# Patient Record
Sex: Male | Born: 1955
Health system: Southern US, Community
[De-identification: ages and names within clinical notes are randomized; demographics above are authoritative.]

## PROBLEM LIST (undated history)

## (undated) DIAGNOSIS — E119 Type 2 diabetes mellitus without complications: Secondary | ICD-10-CM

## (undated) DIAGNOSIS — R42 Dizziness and giddiness: Secondary | ICD-10-CM

## (undated) DIAGNOSIS — I350 Nonrheumatic aortic (valve) stenosis: Secondary | ICD-10-CM

## (undated) DIAGNOSIS — I10 Essential (primary) hypertension: Secondary | ICD-10-CM

## (undated) DIAGNOSIS — E785 Hyperlipidemia, unspecified: Secondary | ICD-10-CM

## (undated) DIAGNOSIS — N400 Enlarged prostate without lower urinary tract symptoms: Secondary | ICD-10-CM

## (undated) DIAGNOSIS — J302 Other seasonal allergic rhinitis: Secondary | ICD-10-CM

## (undated) DIAGNOSIS — R740 Nonspecific elevation of levels of transaminase and lactic acid dehydrogenase [LDH]: Secondary | ICD-10-CM

## (undated) DIAGNOSIS — Z87442 Personal history of urinary calculi: Secondary | ICD-10-CM

## (undated) DIAGNOSIS — J189 Pneumonia, unspecified organism: Secondary | ICD-10-CM

## (undated) DIAGNOSIS — R011 Cardiac murmur, unspecified: Secondary | ICD-10-CM

## (undated) DIAGNOSIS — J383 Other diseases of vocal cords: Secondary | ICD-10-CM

## (undated) DIAGNOSIS — K219 Gastro-esophageal reflux disease without esophagitis: Secondary | ICD-10-CM

## (undated) HISTORY — DX: Personal history of urinary calculi: Z87.442

## (undated) HISTORY — DX: Other diseases of vocal cords: J38.3

## (undated) HISTORY — DX: Essential (primary) hypertension: I10

## (undated) HISTORY — DX: Other seasonal allergic rhinitis: J30.2

## (undated) HISTORY — DX: Cardiac murmur, unspecified: R01.1

## (undated) HISTORY — DX: Nonrheumatic aortic (valve) stenosis: I35.0

## (undated) HISTORY — DX: Nonspecific elevation of levels of transaminase and lactic acid dehydrogenase (ldh): R74.0

## (undated) HISTORY — DX: Hyperlipidemia, unspecified: E78.5

## (undated) HISTORY — DX: Gastro-esophageal reflux disease without esophagitis: K21.9

## (undated) HISTORY — PX: CYSTECTOMY: SUR359

## (undated) HISTORY — DX: Benign prostatic hyperplasia without lower urinary tract symptoms: N40.0

## (undated) HISTORY — DX: Pneumonia, unspecified organism: J18.9

---

## 1997-10-13 DIAGNOSIS — E785 Hyperlipidemia, unspecified: Secondary | ICD-10-CM

## 1997-10-13 DIAGNOSIS — E1169 Type 2 diabetes mellitus with other specified complication: Secondary | ICD-10-CM | POA: Insufficient documentation

## 1997-10-13 HISTORY — DX: Hyperlipidemia, unspecified: E78.5

## 1999-09-13 DIAGNOSIS — J45909 Unspecified asthma, uncomplicated: Secondary | ICD-10-CM | POA: Insufficient documentation

## 1999-10-14 HISTORY — PX: US ECHOCARDIOGRAPHY: HXRAD669

## 1999-10-26 ENCOUNTER — Ambulatory Visit (HOSPITAL_COMMUNITY): Admission: RE | Admit: 1999-10-26 | Discharge: 1999-10-26 | Payer: Self-pay | Admitting: Internal Medicine

## 2000-09-12 HISTORY — PX: CYSTOSCOPY: SUR368

## 2000-09-30 ENCOUNTER — Encounter: Admission: RE | Admit: 2000-09-30 | Discharge: 2000-09-30 | Payer: Self-pay | Admitting: Family Medicine

## 2000-09-30 ENCOUNTER — Encounter: Payer: Self-pay | Admitting: Family Medicine

## 2001-10-13 DIAGNOSIS — K219 Gastro-esophageal reflux disease without esophagitis: Secondary | ICD-10-CM | POA: Insufficient documentation

## 2001-10-13 HISTORY — DX: Gastro-esophageal reflux disease without esophagitis: K21.9

## 2003-05-14 DIAGNOSIS — N400 Enlarged prostate without lower urinary tract symptoms: Secondary | ICD-10-CM | POA: Insufficient documentation

## 2003-05-14 HISTORY — DX: Benign prostatic hyperplasia without lower urinary tract symptoms: N40.0

## 2003-09-13 DIAGNOSIS — I10 Essential (primary) hypertension: Secondary | ICD-10-CM | POA: Insufficient documentation

## 2004-04-12 ENCOUNTER — Encounter: Payer: Self-pay | Admitting: Family Medicine

## 2004-04-12 LAB — CONVERTED CEMR LAB: PSA: 0.31 ng/mL

## 2004-05-01 ENCOUNTER — Ambulatory Visit: Payer: Self-pay | Admitting: Family Medicine

## 2004-05-04 ENCOUNTER — Ambulatory Visit: Payer: Self-pay | Admitting: Family Medicine

## 2004-06-20 ENCOUNTER — Ambulatory Visit: Payer: Self-pay | Admitting: Family Medicine

## 2004-06-20 ENCOUNTER — Encounter: Admission: RE | Admit: 2004-06-20 | Discharge: 2004-06-20 | Payer: Self-pay | Admitting: Family Medicine

## 2004-06-28 ENCOUNTER — Ambulatory Visit: Payer: Self-pay | Admitting: Family Medicine

## 2004-06-30 ENCOUNTER — Ambulatory Visit: Payer: Self-pay | Admitting: Family Medicine

## 2004-09-29 ENCOUNTER — Ambulatory Visit: Payer: Self-pay | Admitting: Family Medicine

## 2004-12-04 ENCOUNTER — Ambulatory Visit: Payer: Self-pay | Admitting: Family Medicine

## 2004-12-13 ENCOUNTER — Encounter: Payer: Self-pay | Admitting: Family Medicine

## 2004-12-13 LAB — CONVERTED CEMR LAB: PSA: 0.35 ng/mL

## 2005-01-08 ENCOUNTER — Ambulatory Visit: Payer: Self-pay | Admitting: Family Medicine

## 2005-05-13 ENCOUNTER — Encounter: Payer: Self-pay | Admitting: Family Medicine

## 2005-05-13 DIAGNOSIS — E119 Type 2 diabetes mellitus without complications: Secondary | ICD-10-CM | POA: Insufficient documentation

## 2005-05-13 DIAGNOSIS — E1169 Type 2 diabetes mellitus with other specified complication: Secondary | ICD-10-CM | POA: Insufficient documentation

## 2005-05-13 LAB — CONVERTED CEMR LAB: PSA: 0.14 ng/mL

## 2005-05-15 ENCOUNTER — Ambulatory Visit: Payer: Self-pay | Admitting: Family Medicine

## 2005-05-17 ENCOUNTER — Ambulatory Visit: Payer: Self-pay | Admitting: Family Medicine

## 2005-06-01 ENCOUNTER — Ambulatory Visit: Payer: Self-pay | Admitting: Family Medicine

## 2005-07-02 ENCOUNTER — Ambulatory Visit: Payer: Self-pay | Admitting: Family Medicine

## 2005-07-16 ENCOUNTER — Ambulatory Visit: Payer: Self-pay | Admitting: Family Medicine

## 2005-11-28 ENCOUNTER — Ambulatory Visit: Payer: Self-pay | Admitting: Family Medicine

## 2006-01-15 ENCOUNTER — Ambulatory Visit: Payer: Self-pay | Admitting: Family Medicine

## 2006-03-27 ENCOUNTER — Ambulatory Visit: Payer: Self-pay | Admitting: Family Medicine

## 2006-03-27 LAB — CONVERTED CEMR LAB
ALT: 35 units/L (ref 0–40)
AST: 18 units/L (ref 0–37)

## 2006-05-21 ENCOUNTER — Ambulatory Visit: Payer: Self-pay | Admitting: Family Medicine

## 2006-05-21 LAB — CONVERTED CEMR LAB
ALT: 33 units/L (ref 0–40)
AST: 22 units/L (ref 0–37)
Albumin: 3.8 g/dL (ref 3.5–5.2)
Alkaline Phosphatase: 56 units/L (ref 39–117)
BUN: 22 mg/dL (ref 6–23)
Bilirubin, Direct: 0.1 mg/dL (ref 0.0–0.3)
CO2: 31 meq/L (ref 19–32)
Calcium: 9 mg/dL (ref 8.4–10.5)
Chloride: 105 meq/L (ref 96–112)
Cholesterol: 174 mg/dL (ref 0–200)
Creatinine, Ser: 0.8 mg/dL (ref 0.4–1.5)
GFR calc Af Amer: 133 mL/min
GFR calc non Af Amer: 110 mL/min
Glucose, Bld: 102 mg/dL — ABNORMAL HIGH (ref 70–99)
HDL: 58.8 mg/dL (ref >39.0)
LDL Cholesterol: 100 mg/dL — ABNORMAL HIGH (ref 0–99)
PSA: 0.16 ng/mL (ref 0.10–4.00)
Potassium: 4.7 meq/L (ref 3.5–5.1)
Sodium: 140 meq/L (ref 135–145)
TSH: 1.14 microintl units/mL (ref 0.35–5.50)
Total Bilirubin: 0.7 mg/dL (ref 0.3–1.2)
Total CHOL/HDL Ratio: 3
Total Protein: 6.7 g/dL (ref 6.0–8.3)
Triglycerides: 76 mg/dL (ref 0–149)
VLDL: 15 mg/dL (ref 0–40)

## 2006-05-23 ENCOUNTER — Ambulatory Visit: Payer: Self-pay | Admitting: Family Medicine

## 2006-05-23 ENCOUNTER — Encounter: Payer: Self-pay | Admitting: Family Medicine

## 2006-05-23 DIAGNOSIS — J309 Allergic rhinitis, unspecified: Secondary | ICD-10-CM | POA: Insufficient documentation

## 2006-08-27 ENCOUNTER — Ambulatory Visit: Payer: Self-pay | Admitting: Family Medicine

## 2006-11-20 ENCOUNTER — Ambulatory Visit: Payer: Self-pay | Admitting: Family Medicine

## 2006-11-20 DIAGNOSIS — L719 Rosacea, unspecified: Secondary | ICD-10-CM | POA: Insufficient documentation

## 2007-05-14 HISTORY — PX: FOOT CAPSULE RELEASE W/ PERCUTANEOUS HEEL CORD LENGTHENING, TIBIAL TENDON TRANSFER: SHX1658

## 2007-06-02 ENCOUNTER — Ambulatory Visit: Payer: Self-pay | Admitting: Family Medicine

## 2007-06-02 LAB — CONVERTED CEMR LAB
ALT: 61 units/L — ABNORMAL HIGH (ref 0–53)
AST: 33 units/L (ref 0–37)
Albumin: 4 g/dL (ref 3.5–5.2)
Alkaline Phosphatase: 50 units/L (ref 39–117)
BUN: 24 mg/dL — ABNORMAL HIGH (ref 6–23)
Bilirubin, Direct: 0.1 mg/dL (ref 0.0–0.3)
CO2: 28 meq/L (ref 19–32)
Calcium: 9.1 mg/dL (ref 8.4–10.5)
Chloride: 105 meq/L (ref 96–112)
Cholesterol: 210 mg/dL (ref 0–200)
Creatinine, Ser: 1 mg/dL (ref 0.4–1.5)
Creatinine,U: 230 mg/dL
Direct LDL: 134.4 mg/dL
GFR calc Af Amer: 102 mL/min
GFR calc non Af Amer: 84 mL/min
Glucose, Bld: 104 mg/dL — ABNORMAL HIGH (ref 70–99)
HDL: 48.4 mg/dL (ref >39.0)
Microalb Creat Ratio: 11.7 mg/g (ref 0.0–30.0)
Microalb, Ur: 2.7 mg/dL — ABNORMAL HIGH (ref 0.0–1.9)
PSA: 0.19 ng/mL (ref 0.10–4.00)
Potassium: 4.3 meq/L (ref 3.5–5.1)
Sodium: 139 meq/L (ref 135–145)
TSH: 1.59 microintl units/mL (ref 0.35–5.50)
Total Bilirubin: 0.8 mg/dL (ref 0.3–1.2)
Total CHOL/HDL Ratio: 4.3
Total Protein: 7.1 g/dL (ref 6.0–8.3)
Triglycerides: 177 mg/dL — ABNORMAL HIGH (ref 0–149)
VLDL: 35 mg/dL (ref 0–40)

## 2007-06-06 ENCOUNTER — Ambulatory Visit: Payer: Self-pay | Admitting: Family Medicine

## 2007-07-03 ENCOUNTER — Telehealth: Payer: Self-pay | Admitting: Family Medicine

## 2007-11-04 ENCOUNTER — Ambulatory Visit: Payer: Self-pay | Admitting: Family Medicine

## 2007-12-23 ENCOUNTER — Ambulatory Visit: Payer: Self-pay | Admitting: Family Medicine

## 2008-01-13 ENCOUNTER — Encounter: Payer: Self-pay | Admitting: Family Medicine

## 2008-01-16 ENCOUNTER — Encounter: Payer: Self-pay | Admitting: Family Medicine

## 2008-01-19 ENCOUNTER — Telehealth: Payer: Self-pay | Admitting: Family Medicine

## 2008-02-13 HISTORY — PX: COLONOSCOPY: SHX174

## 2008-04-06 ENCOUNTER — Telehealth: Payer: Self-pay | Admitting: Family Medicine

## 2008-04-08 ENCOUNTER — Encounter: Payer: Self-pay | Admitting: Family Medicine

## 2008-05-07 ENCOUNTER — Telehealth: Payer: Self-pay | Admitting: Family Medicine

## 2008-06-07 ENCOUNTER — Ambulatory Visit: Payer: Self-pay | Admitting: Family Medicine

## 2008-06-08 LAB — CONVERTED CEMR LAB
ALT: 48 units/L (ref 0–53)
AST: 30 units/L (ref 0–37)
Albumin: 3.8 g/dL (ref 3.5–5.2)
BUN: 20 mg/dL (ref 6–23)
Basophils Relative: 0 % (ref 0.0–3.0)
Chloride: 110 meq/L (ref 96–112)
Cholesterol: 165 mg/dL (ref 0–200)
Eosinophils Relative: 1.2 % (ref 0.0–5.0)
HCT: 41.4 % (ref 39.0–52.0)
Hemoglobin: 14.3 g/dL (ref 13.0–17.0)
LDL Cholesterol: 96 mg/dL (ref 0–99)
Lymphs Abs: 1.9 10*3/uL (ref 0.7–4.0)
MCV: 94.8 fL (ref 78.0–100.0)
Microalb, Ur: 0.5 mg/dL (ref 0.0–1.9)
Monocytes Absolute: 0.2 10*3/uL (ref 0.1–1.0)
Monocytes Relative: 3.2 % (ref 3.0–12.0)
Neutro Abs: 4.5 10*3/uL (ref 1.4–7.7)
Platelets: 183 10*3/uL (ref 150.0–400.0)
Potassium: 4.7 meq/L (ref 3.5–5.1)
Sodium: 142 meq/L (ref 135–145)
TSH: 1.8 microintl units/mL (ref 0.35–5.50)
Total Bilirubin: 0.8 mg/dL (ref 0.3–1.2)
Total Protein: 6.7 g/dL (ref 6.0–8.3)
WBC: 6.7 10*3/uL (ref 4.5–10.5)

## 2008-06-09 ENCOUNTER — Ambulatory Visit: Payer: Self-pay | Admitting: Family Medicine

## 2008-06-17 ENCOUNTER — Ambulatory Visit: Payer: Self-pay | Admitting: Family Medicine

## 2008-06-17 LAB — CONVERTED CEMR LAB: OCCULT 3: NEGATIVE

## 2008-06-24 ENCOUNTER — Ambulatory Visit: Payer: Self-pay | Admitting: Family Medicine

## 2008-07-20 ENCOUNTER — Ambulatory Visit: Payer: Self-pay | Admitting: Family Medicine

## 2008-07-20 DIAGNOSIS — K921 Melena: Secondary | ICD-10-CM | POA: Insufficient documentation

## 2008-07-20 LAB — CONVERTED CEMR LAB
OCCULT 1: POSITIVE
OCCULT 2: POSITIVE

## 2008-08-06 ENCOUNTER — Ambulatory Visit: Payer: Self-pay | Admitting: Gastroenterology

## 2008-08-20 ENCOUNTER — Ambulatory Visit: Payer: Self-pay | Admitting: Gastroenterology

## 2008-08-20 LAB — HM COLONOSCOPY: HM Colonoscopy: NORMAL

## 2008-09-03 ENCOUNTER — Ambulatory Visit: Payer: Self-pay | Admitting: Gastroenterology

## 2008-09-07 ENCOUNTER — Encounter: Payer: Self-pay | Admitting: Gastroenterology

## 2008-09-07 LAB — CONVERTED CEMR LAB
OCCULT 1: NEGATIVE
OCCULT 2: NEGATIVE
OCCULT 5: NEGATIVE

## 2008-10-01 ENCOUNTER — Telehealth: Payer: Self-pay | Admitting: Family Medicine

## 2008-10-11 ENCOUNTER — Ambulatory Visit: Payer: Self-pay | Admitting: Family Medicine

## 2008-10-20 ENCOUNTER — Ambulatory Visit: Payer: Self-pay | Admitting: Family Medicine

## 2008-10-20 DIAGNOSIS — F341 Dysthymic disorder: Secondary | ICD-10-CM | POA: Insufficient documentation

## 2008-12-09 ENCOUNTER — Ambulatory Visit: Payer: Self-pay | Admitting: Family Medicine

## 2009-02-28 ENCOUNTER — Telehealth: Payer: Self-pay | Admitting: Family Medicine

## 2009-03-01 ENCOUNTER — Telehealth: Payer: Self-pay | Admitting: Family Medicine

## 2009-05-24 ENCOUNTER — Ambulatory Visit: Payer: Self-pay | Admitting: Family Medicine

## 2009-05-24 LAB — CONVERTED CEMR LAB
ALT: 48 units/L (ref 0–53)
AST: 32 units/L (ref 0–37)
BUN: 23 mg/dL (ref 6–23)
Basophils Relative: 0.4 % (ref 0.0–3.0)
Bilirubin, Direct: 0.1 mg/dL (ref 0.0–0.3)
CO2: 28 meq/L (ref 19–32)
Chloride: 106 meq/L (ref 96–112)
Cholesterol: 172 mg/dL (ref 0–200)
Creatinine, Ser: 0.9 mg/dL (ref 0.4–1.5)
Creatinine,U: 134.6 mg/dL
HCT: 43.6 % (ref 39.0–52.0)
Hemoglobin: 14.7 g/dL (ref 13.0–17.0)
LDL Cholesterol: 91 mg/dL (ref 0–99)
Lymphocytes Relative: 23.4 % (ref 12.0–46.0)
Lymphs Abs: 2.1 10*3/uL (ref 0.7–4.0)
Microalb Creat Ratio: 4.5 mg/g (ref 0.0–30.0)
Microalb, Ur: 0.6 mg/dL (ref 0.0–1.9)
Monocytes Relative: 5.2 % (ref 3.0–12.0)
Neutro Abs: 6.2 10*3/uL (ref 1.4–7.7)
Potassium: 4.1 meq/L (ref 3.5–5.1)
RBC: 4.56 M/uL (ref 4.22–5.81)
RDW: 13.2 % (ref 11.5–14.6)
Total Bilirubin: 0.4 mg/dL (ref 0.3–1.2)
Total CHOL/HDL Ratio: 3
Total Protein: 6.8 g/dL (ref 6.0–8.3)
Triglycerides: 93 mg/dL (ref 0.0–149.0)

## 2009-05-25 LAB — CONVERTED CEMR LAB: Vit D, 25-Hydroxy: 20 ng/mL — ABNORMAL LOW (ref 30–89)

## 2009-05-30 ENCOUNTER — Ambulatory Visit: Payer: Self-pay | Admitting: Family Medicine

## 2009-05-30 DIAGNOSIS — K4090 Unilateral inguinal hernia, without obstruction or gangrene, not specified as recurrent: Secondary | ICD-10-CM | POA: Insufficient documentation

## 2009-05-30 DIAGNOSIS — K439 Ventral hernia without obstruction or gangrene: Secondary | ICD-10-CM | POA: Insufficient documentation

## 2009-06-16 ENCOUNTER — Encounter: Payer: Self-pay | Admitting: Family Medicine

## 2009-06-22 ENCOUNTER — Ambulatory Visit: Payer: Self-pay | Admitting: General Surgery

## 2009-06-28 ENCOUNTER — Ambulatory Visit: Payer: Self-pay | Admitting: General Surgery

## 2009-06-28 ENCOUNTER — Encounter: Payer: Self-pay | Admitting: Family Medicine

## 2009-06-28 HISTORY — PX: INGUINAL HERNIA REPAIR: SUR1180

## 2009-07-05 ENCOUNTER — Encounter: Payer: Self-pay | Admitting: Family Medicine

## 2009-07-14 ENCOUNTER — Encounter: Payer: Self-pay | Admitting: Family Medicine

## 2009-09-20 ENCOUNTER — Encounter (INDEPENDENT_AMBULATORY_CARE_PROVIDER_SITE_OTHER): Payer: Self-pay | Admitting: *Deleted

## 2009-11-14 ENCOUNTER — Encounter: Payer: Self-pay | Admitting: Family Medicine

## 2009-11-14 ENCOUNTER — Ambulatory Visit: Payer: Self-pay | Admitting: Family Medicine

## 2009-11-14 DIAGNOSIS — E559 Vitamin D deficiency, unspecified: Secondary | ICD-10-CM | POA: Insufficient documentation

## 2009-11-14 DIAGNOSIS — B9789 Other viral agents as the cause of diseases classified elsewhere: Secondary | ICD-10-CM | POA: Insufficient documentation

## 2009-11-16 LAB — CONVERTED CEMR LAB: Vit D, 25-Hydroxy: 37 ng/mL (ref 30–89)

## 2009-11-17 ENCOUNTER — Ambulatory Visit: Payer: Self-pay | Admitting: Family Medicine

## 2009-11-28 ENCOUNTER — Telehealth: Payer: Self-pay | Admitting: Family Medicine

## 2009-11-28 ENCOUNTER — Ambulatory Visit: Payer: Self-pay | Admitting: Family Medicine

## 2009-11-28 DIAGNOSIS — R490 Dysphonia: Secondary | ICD-10-CM | POA: Insufficient documentation

## 2009-12-01 ENCOUNTER — Encounter: Payer: Self-pay | Admitting: Family Medicine

## 2010-01-12 ENCOUNTER — Encounter: Payer: Self-pay | Admitting: Family Medicine

## 2010-02-08 ENCOUNTER — Telehealth: Payer: Self-pay | Admitting: Family Medicine

## 2010-02-09 ENCOUNTER — Ambulatory Visit
Admission: RE | Admit: 2010-02-09 | Discharge: 2010-02-09 | Payer: Self-pay | Source: Home / Self Care | Attending: Family Medicine | Admitting: Family Medicine

## 2010-02-09 DIAGNOSIS — S63529A Sprain of radiocarpal joint of unspecified wrist, initial encounter: Secondary | ICD-10-CM | POA: Insufficient documentation

## 2010-02-12 DIAGNOSIS — J383 Other diseases of vocal cords: Secondary | ICD-10-CM

## 2010-02-12 HISTORY — DX: Other diseases of vocal cords: J38.3

## 2010-02-23 ENCOUNTER — Encounter: Payer: Self-pay | Admitting: Family Medicine

## 2010-03-09 ENCOUNTER — Telehealth: Payer: Self-pay | Admitting: Family Medicine

## 2010-03-14 NOTE — Letter (Signed)
Summary: Nadara Eaton letter  [place] [facility]  [address] [place], [place] [address]   Phone: [phone]  Fax: [phone]       09/20/2009 MRN: [id number]  DAVONN FLANERY [address] [place], [place]  [address]  Dear Mr. [facility] [place], and [place] announce the retirement of Arta Silence, M.D., from full-time practice at the [facility] office effective August 11, 2009 and his plans of returning part-time.  It is important to Dr. Hetty Ely and to our practice that you understand that [facility] has seven physicians in our office for your health care needs.  We will continue to offer the same exceptional care that you have today.    Dr. Hetty Ely has spoken to many of you about his plans for retirement and returning part-time in the fall.   We will continue to work with you through the transition to schedule appointments for you in the office and meet the high standards that [place] is committed to.   Again, it is with great pleasure that we share the news that Dr. Hetty Ely will return to [facility] at [facility] in October of 2011 with a reduced schedule.    If you have any questions, or would like to request an appointment with one of our physicians, please call us at [phone] and press the option for Scheduling an appointment.  We take pleasure in providing you with excellent patient care and look forward to seeing you at your next office visit.  Our [facility] Physicians are:  Tillman Abide, M.D. Laurita Quint, M.D. Roxy Manns, M.D. Kerby Nora, M.D. [name] Beat, M.D. Ruthe Mannan, M.D. We proudly welcomed Raechel Ache, M.D. and [name], M.D. to the practice in July/August 2011.  Sincerely,  [place] [facility]

## 2010-03-14 NOTE — Letter (Signed)
Summary: [name]  [name]   Imported By: Maryln Gottron 12/15/2009 14:33:14  _____________________________________________________________________  External Attachment:    Type:   Image     Comment:   External Document  Appended Document:  [facility], Nose & Throat    Clinical Lists Changes  Observations: Added new observation of PAST MED HX: Hyperlipidemia (10/13/1997) GERD (10/13/2001) Asthma (09/13/1999) Benign prostatic hypertrophy (05/14/2003) Hypertension (09/13/2003) ENT eval for GERD and thrush 2011 (12/15/2009 15:13)       Past History:  Past Medical History: Hyperlipidemia (10/13/1997) GERD (10/13/2001) Asthma (09/13/1999) Benign prostatic hypertrophy (05/14/2003) Hypertension (09/13/2003) ENT eval for GERD and thrush 2011

## 2010-03-14 NOTE — Op Note (Signed)
Summary: Left Inguinal hernia reqair with PROloop Mesh,Dr.Jeffrey Byrnett  Left Inguinal hernia reqair with [facility] Mesh,Dr.Jeffrey Byrnett,[facility]   Imported By: Beau Fanny 06/29/2009 11:49:05  _____________________________________________________________________  External Attachment:    Type:   Image     Comment:   External Document  Appended Document: Left Inguinal hernia reqair with [facility] Mesh,Dr.Jeffrey Byrnett    Clinical Lists Changes  Observations: Added new observation of PAST SURG HX: ECHO Tr TR,MR 10/17/1999 FLEXSIG (Lyliana Dicenso) Proctitis 03/2000 CYSTOSCOPY Stone retrieval 09/2000 Cystectomy from back  Heel cord release (Dr Al Corpus) 05/23/2007 Colonoscopy Nml  (Dr Arlyce Dice) 08/20/2008 L Inguinal Hernia Repair (Dr Lemar Livings) 06/28/2009 (06/30/2009 7:49)       Past Surgical History:    ECHO Tr TR,MR 10/17/1999    FLEXSIG (Hanford Lust) Proctitis 03/2000    CYSTOSCOPY Stone retrieval 09/2000    Cystectomy from back     Heel cord release (Dr Al Corpus) 05/23/2007    Colonoscopy Nml  (Dr Arlyce Dice) 08/20/2008    L Inguinal Hernia Repair (Dr Lemar Livings) 06/28/2009

## 2010-03-14 NOTE — Progress Notes (Signed)
Summary: Rx Avodart  Phone Note Refill Request Call back at [phone] Message from:  [facility] on February 28, 2009 8:46 AM  Refills Requested: Medication #1:  AVODART 0.5 MG  CAPS 1TABLET DAILY AT BEDTIME Received faxed refill request, please advise.  Fax number [phone]   Method Requested: Fax to Mail Away Pharmacy Initial call taken by: Linde Gillis CMA Duncan Dull),  February 28, 2009 8:47 AM    Prescriptions: AVODART 0.5 MG  CAPS (DUTASTERIDE) 1TABLET DAILY AT BEDTIME  #90 x 4   Entered by:   Delilah Shan CMA (AAMA)   Authorized by:   Shaune Leeks MD   Signed by:   Delilah Shan CMA (AAMA) on 02/28/2009   Method used:   Faxed to ...       [facility])       [address] [place], [place]  [address]       Ph: [phone]       Fax: [phone]   RxID:   [id number]

## 2010-03-14 NOTE — Consult Note (Signed)
Summary: [facility]  [facility]   Imported By: Lanelle Bal 06/23/2009 10:54:30  _____________________________________________________________________  External Attachment:    Type:   Image     Comment:   External Document

## 2010-03-14 NOTE — Assessment & Plan Note (Signed)
Summary: HEAD COLD/DLO   Vital Signs:  Patient profile:   55 year old male Height:      69 inches Weight:      237 pounds BMI:     35.13 Temp:     98.5 degrees F oral Pulse rate:   84 / minute Pulse rhythm:   regular BP sitting:   130 / 80  (left arm) Cuff size:   large  Vitals Entered By: Delilah Shan CMA Dannon Nguyenthi Dull) (November 14, 2009 2:10 PM) CC: Head cold   History of Present Illness: Head and chest congestion.  No help with mucinex.  Cough and night, occ during the day.  Occ sputum.  Voice change noted.  Episodic coughing fits.  Was a little dizzy Friday- "My balance was off."  No presyncope.  Inc in tinnitus.  Ears feel hot.  No FCNAV.  No sick contacts.  Occ wheeze and worse with the recent symptoms.  Ventolin helped some with wheeze, especially during the heat of summer.  SABA helps at night.  No change with robitussen DM.  Sx for  ~1 week.    Allergies: 1)  ! Codeine 2)  ! * Tussinex 3)  ! * Tramadol  Past History:  Past Medical History: Last updated: 05/23/2006 Hyperlipidemia (10/13/1997) GERD (10/13/2001) Asthma (09/13/1999) Benign prostatic hypertrophy (05/14/2003) Hypertension (09/13/2003)  Review of Systems       See HPI.  Otherwise negative.    Physical Exam  General:  GEN: nad, alert and oriented HEENT: mucous membranes moist, TM w/o erythema but B SOM, nasal epithelium injected, OP with cobblestoning NECK: supple w/o LA CV: rrr. PULM: ctab, no inc wob, no wheeze ABD: soft, +bs EXT: no edema    Impression & Recommendations:  Problem # 1:  VIRAL INFECTION (ICD-079.99) Also with likely ETD.  Samples given for veramyst.  Supportive tx.  No indication for antibiotics.  d/w patient.  He understood.  Use tessalon and tussionex.  Sedation caution given.  He understood instructions [id number].   His updated medication list for this problem includes:    Tessalon 200 Mg Caps (Benzonatate) .Marland Kitchen... 1 by mouth three times a day as needed for cough    Hydrocod  Polst-chlorphen Polst 10-8 Mg/51ml Lqcr (Hydrocod polst-chlorphen polst) .Marland KitchenMarland KitchenMarland KitchenMarland Kitchen 5 ml by mouth at bedtime as needed for cough  Complete Medication List: 1)  Avodart 0.5 Mg Caps (Dutasteride) .Marland Kitchen.. 1tablet daily at bedtime 2)  Klor-con 10 10 Meq Tbcr (Potassium chloride) .Marland Kitchen.. 1 daily by mouth 3)  Advair Diskus 100-50 Mcg/dose Misc (Fluticasone-salmeterol) .... Use one inhalation two times a day 4)  Lisinopril 5 Mg Tabs (Lisinopril) .Marland Kitchen.. 1 daily by mouth 5)  Simvastatin 80 Mg Tabs (Simvastatin) .... One tab by mouth at nightl. 6)  Lansoprazole 30 Mg Cpdr (Lansoprazole) .... One tab by mouth 45 mins prior to brfst. 7)  Advil Pm 200-25 Mg Caps (Ibuprofen-diphenhydramine hcl) .... At bedtime for sleep 8)  Zoloft 50 Mg Tabs (Sertraline hcl) .... Take one by mouth daily 9)  Ventolin Hfa 108 (90 Base) Mcg/act Aers (Albuterol sulfate) .... 2 inhalations every 4 hours as needed 10)  Tessalon 200 Mg Caps (Benzonatate) .Marland Kitchen.. 1 by mouth three times a day as needed for cough 11)  Hydrocod Polst-chlorphen Polst 10-8 Mg/92ml Lqcr (Hydrocod polst-chlorphen polst) .... 5 ml by mouth at bedtime as needed for cough  Patient Instructions: 1)  I would use the veramyst 1-2 sprays per nostril per day. Use the cough syrup at night (it can  make you drowsy) and the tessalon during the day.  Both may help your cough.  Take care. This should gradually get better.  Prescriptions: HYDROCOD POLST-CHLORPHEN POLST 10-8 MG/5ML LQCR (HYDROCOD POLST-CHLORPHEN POLST) 5 ml by mouth at bedtime as needed for cough  #81mL x 0   Entered and Authorized by:   Crawford Givens MD   Signed by:   Crawford Givens MD on 11/14/2009   Method used:   Print then Give to Patient   RxID:   [id number] TESSALON 200 MG CAPS (BENZONATATE) 1 by mouth three times a day as needed for cough  #30 x 1   Entered and Authorized by:   Crawford Givens MD   Signed by:   Crawford Givens MD on 11/14/2009   Method used:   Print then Give to Patient   RxID:    [id number]   Current Allergies (reviewed today): ! CODEINE ! * TUSSINEX ! * TRAMADOL

## 2010-03-14 NOTE — Assessment & Plan Note (Signed)
Summary: THROAT/CLE   Vital Signs:  Patient profile:   55 year old male Weight:      238 pounds Temp:     98.3 degrees F oral Pulse rate:   84 / minute Pulse rhythm:   regular BP sitting:   120 / 82  (left arm) Cuff size:   large  Vitals Entered By: Sydell Axon LPN (November 28, 2009 10:44 AM) CC: Started with sinus problems about 3 weeks ago, hoarseness for 2 weeks and dry cough, has taken a Z-pak   History of Present Illness: Didn't get totally better from before.   Note from 10/3 and 10/7 reviewd.  Was on on zmax after 10/7.  After the 7th, patient felt some better for a few days but then symptoms returned.  "I thought it was getting better but then it came back."  Voice is still strained and sounds hoarse.  Coughing fits at night, almost to vomiting.  No fevers.  Occ wheeze.  This isn't typical for the fall for the patient.  He has had some hoarseness before, but not for this duration.  Minimal sputum, occ "a chunk of something yellow or green will come up."    Heartburn is much improved on lansoprazole.  Ears continue to ring, more than normal.   No fevers.  "This doesn't feel like a typical cold."  His symptoms are mainly localized in the throat, esp with the dysphonia and cough.   Allergies: 1)  ! Codeine 2)  ! * Tussinex 3)  ! * Tramadol  Past History:  Past Medical History: Last updated: 05/23/2006 Hyperlipidemia (10/13/1997) GERD (10/13/2001) Asthma (09/13/1999) Benign prostatic hypertrophy (05/14/2003) Hypertension (09/13/2003)  Review of Systems       See HPI.  Otherwise negative.    Physical Exam  General:  General:  GEN: nad, alert and oriented HEENT: mucous membranes moist, TM w/o erythema, nasal epithelium mildly injected, OP with cobblestoning NECK: supple w/o LA, no stridor CV: rrr. PULM: ctab, no inc wob, no wheeze ABD: soft, +bs EXT: no edema  voice is hoarse   Impression & Recommendations:  Problem # 1:  DYSPHONIA (EAV-409.81) Pt has  been treated with zmax with only temp change in symptoms.  He doesn't have fevers.  I am concerned for GERD-realted symptoms.  I am less concerned for acute bacterial process at this point.  Lungs are clear and I think that the cough is not of lower pulmonary in origin.  We talked about options.  I don't see that another round of antibiotics would be highly useful (and may cause unwanted downstream effects).  We considered options and elected to increase the PPI to two times a day and refer to ENT.  I appreciate their input.  He agrees with plan.  Orders: ENT Referral (ENT)  Complete Medication List: 1)  Avodart 0.5 Mg Caps (Dutasteride) .Marland Kitchen.. 1tablet daily at bedtime 2)  Klor-con 10 10 Meq Tbcr (Potassium chloride) .Marland Kitchen.. 1 daily by mouth 3)  Advair Diskus 100-50 Mcg/dose Misc (Fluticasone-salmeterol) .... Use one inhalation two times a day 4)  Lisinopril 5 Mg Tabs (Lisinopril) .Marland Kitchen.. 1 daily by mouth 5)  Simvastatin 80 Mg Tabs (Simvastatin) .... One tab by mouth at nightl. 6)  Lansoprazole 30 Mg Cpdr (Lansoprazole) .... One tab by mouth 45 mins prior to brfst and again before supper 7)  Advil Pm 200-25 Mg Caps (Ibuprofen-diphenhydramine hcl) .... At bedtime for sleep 8)  Zoloft 50 Mg Tabs (Sertraline hcl) .... Take one by mouth  daily 9)  Ventolin Hfa 108 (90 Base) Mcg/act Aers (Albuterol sulfate) .... 2 inhalations every 4 hours as needed 10)  Hydrocod Polst-chlorphen Polst 10-8 Mg/4ml Lqcr (Hydrocod polst-chlorphen polst) .... 5 ml by mouth at bedtime as needed for cough  Patient Instructions: 1)  Take the lansoprazole two times a day and see Shirlee Limerick about your referral before your leave today.   I think your voice change may be related to reflux and ENT could help with this.  Take care.   Orders Added: 1)  Est. Patient Level III [81[address]] 2)  ENT Referral [ENT]    Current Allergies (reviewed today): ! CODEINE ! * TUSSINEX ! * TRAMADOL

## 2010-03-14 NOTE — Progress Notes (Signed)
Summary: RX Avodart and Advair  Phone Note Refill Request Message from:  [facility] on March 01, 2009 9:53 AM  Refills Requested: Medication #1:  AVODART 0.5 MG  CAPS 1TABLET DAILY AT BEDTIME  Medication #2:  ADVAIR DISKUS 100-50 MCG/DOSE  MISC USE ONE INHALATION BID  Method Requested: Fax to Mail Away Pharmacy Initial call taken by: Sydell Axon LPN,  March 01, 2009 9:54 AM    Prescriptions: ADVAIR DISKUS 100-50 MCG/DOSE  MISC (FLUTICASONE-SALMETEROL) USE ONE INHALATION BID  #3 PKG x 3   Entered by:   Sydell Axon LPN   Authorized by:   Shaune Leeks MD   Signed by:   Sydell Axon LPN on 08/12/1599   Method used:   Electronically to        [facility]* (mail-order)       [address] [place], [place]  [address]       Ph: [phone]       Fax: [phone]   RxID:   [id number] AVODART 0.5 MG  CAPS (DUTASTERIDE) 1TABLET DAILY AT BEDTIME  #90 x 3   Entered by:   Sydell Axon LPN   Authorized by:   Shaune Leeks MD   Signed by:   Sydell Axon LPN on 69/48/5462   Method used:   Electronically to        [facility]* (mail-order)       [address] [place], [place]  [address]       Ph: [phone]       Fax: [phone]   RxID:   [id number]  Received faxed refill request. from pharmacy.

## 2010-03-14 NOTE — Letter (Signed)
Summary: Dr.Jeffrey Byrnett,[facility],Note  Dr.Jeffrey Byrnett,[facility],Note   Imported By: Beau Fanny 07/07/2009 10:15:35  _____________________________________________________________________  External Attachment:    Type:   Image     Comment:   External Document

## 2010-03-14 NOTE — Assessment & Plan Note (Signed)
Summary: follow up after labs/alc   Vital Signs:  Patient profile:   55 year old male Weight:      229 pounds Temp:     98.8 degrees F oral Pulse rate:   80 / minute Pulse rhythm:   regular BP sitting:   124 / 82  (left arm) Cuff size:   large  Vitals Entered By: Sydell Axon LPN (November 17, 2009 8:08 AM)  History of Present Illness: Pt here for followup of Vit D which is now therapeutic. He has continued taking Vit D bid since being seen last time. His cold is no better and he has significant coughing spells at all times. He denies fever and chills. He has been hoarse significantly for4-5 months "off and on". He feels fine otherwise, even now with congestion and cough.  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 Codeine 2)  ! * Tussinex 3)  ! * Tramadol  Physical Exam  General:  Well-developed,well-nourished,in no acute distress; alert,appropriate and cooperative throughout examination, mildly congested and hoarse. Head:  Normocephalic and atraumatic without obvious abnormalities. No apparent alopecia or balding. Sinuses NT. Eyes:  Conjunctiva clear bilaterally.  Ears:  External ear exam shows no  significant lesions or deformities.  Otoscopic examination reveals clear canals, tympanic membranes are intact bilaterally without bulging, retraction, inflammation or discharge. Hearing is grossly normal bilaterally. Nose:  External nasal examination shows no deformity or inflammation. Nasal mucosa are pink and moist without lesions or exudates. Mild  inflammation with gray discharge. Mouth:  Oral mucosa and oropharynx without lesions or exudates.  Teeth in good repair. Mild gray PND. Neck:  No deformities, masses, or tenderness noted. Chest Wall:  No deformities, masses, tenderness or gynecomastia noted. Lungs:  Normal respiratory effort, chest expands symmetrically. Lungs are clear to auscultation, no crackles or wheezes. Heart:  Normal rate and regular rhythm. S1 and S2 normal without gallop, click, rub or other extra sounds. I-II/VI sys murmur best along LLSB, longstanding since childhood. Abdomen:  Bowel sounds positive,abdomen soft and non-tender without masses, organomegaly noted. Chronic L inguinal hernia. Abdomen mildly protuberant. Mild ventral hernia w/o defect noted.   Impression & Recommendations:  Problem # 1:  URI (ICD-465.9) Assessment Unchanged  See below. His updated medication list for this problem includes:    Tessalon 200 Mg Caps (Benzonatate) .Marland Kitchen... 1 by mouth three times a day as needed for cough    Hydrocod Polst-chlorphen Polst 10-8 Mg/79ml Lqcr (Hydrocod polst-chlorphen polst) .Marland KitchenMarland KitchenMarland KitchenMarland Kitchen 5 ml by mouth at bedtime as needed for cough  Instructed on symptomatic treatment. Call if symptoms persist or worsen.   Problem # 2:  BRONCHITIS- ACUTE (ICD-466.0) Assessment: New  Will pulse with Zmax and hydrate aggreessively with frequent gargling. His updated medication  list for this problem includes:    Advair Diskus 100-50 Mcg/dose Misc (Fluticasone-salmeterol) ..... Use one inhalation two times a day    Ventolin Hfa 108 (90 Base) Mcg/act Aers (Albuterol sulfate) .Marland Kitchen... 2  inhalations every 4 hours as needed    Tessalon 200 Mg Caps (Benzonatate) .Marland Kitchen... 1 by mouth three times a day as needed for cough    Hydrocod Polst-chlorphen Polst 10-8 Mg/67ml Lqcr (Hydrocod polst-chlorphen polst) .Marland KitchenMarland KitchenMarland KitchenMarland Kitchen 5 ml by mouth at bedtime as needed for cough    Zithromax Z-pak 250 Mg Tabs (Azithromycin) .Marland Kitchen... As dir  Take antibiotics and other medications as directed. Encouraged to push clear liquids, get enough rest, and take acetaminophen as needed. To be seen in 5-7 days if no improvement, sooner if worse.  Problem # 3:  UNSPECIFIED VITAMIN D DEFICIENCY (ICD-268.9) Assessment: Improved Now therapeutic, cont daily two times a day dosing.  Problem # 4:  INGUINAL HERNIA, LEFT (ICD-550.90) Assessment: Improved S/P repair. Doing well.  Problem # 5:  HYPERTENSION (ICD-401.9) Assessment: Unchanged Stable. His updated medication list for this problem includes:    Lisinopril 5 Mg Tabs (Lisinopril) .Marland Kitchen... 1 daily by mouth  BP today: 124/82 Prior BP: 130/80 (11/14/2009)  Labs Reviewed: K+: 4.1 (05/24/2009) Creat: : 0.9 (05/24/2009)   Chol: 172 (05/24/2009)   HDL: 62.50 (05/24/2009)   LDL: 91 (05/24/2009)   TG: 93.0 (05/24/2009)  Complete Medication List: 1)  Avodart 0.5 Mg Caps (Dutasteride) .Marland Kitchen.. 1tablet daily at bedtime 2)  Klor-con 10 10 Meq Tbcr (Potassium chloride) .Marland Kitchen.. 1 daily by mouth 3)  Advair Diskus 100-50 Mcg/dose Misc (Fluticasone-salmeterol) .... Use one inhalation two times a day 4)  Lisinopril 5 Mg Tabs (Lisinopril) .Marland Kitchen.. 1 daily by mouth 5)  Simvastatin 80 Mg Tabs (Simvastatin) .... One tab by mouth at nightl. 6)  Lansoprazole 30 Mg Cpdr (Lansoprazole) .... One tab by mouth 45 mins prior to brfst. 7)  Advil Pm 200-25 Mg Caps (Ibuprofen-diphenhydramine hcl) .... At bedtime for sleep 8)  Zoloft 50 Mg Tabs (Sertraline hcl) .... Take one by mouth daily 9)  Ventolin Hfa 108 (90 Base) Mcg/act Aers (Albuterol sulfate) .... 2 inhalations every 4 hours as needed 10)  Tessalon  200 Mg Caps (Benzonatate) .Marland Kitchen.. 1 by mouth three times a day as needed for cough 11)  Hydrocod Polst-chlorphen Polst 10-8 Mg/55ml Lqcr (Hydrocod polst-chlorphen polst) .... 5 ml by mouth at bedtime as needed for cough 12)  Zithromax Z-pak 250 Mg Tabs (Azithromycin) .... As dir  Patient Instructions: 1)  Call if hoarseness conts after URI resolved. Prescriptions: ZITHROMAX Z-PAK 250 MG TABS (AZITHROMYCIN) as dir  #1 zpak x 0   Entered and Authorized by:   Shaune Leeks MD   Signed by:   Shaune Leeks MD on 11/17/2009   Method used:   Electronically to        [facility] [address]* (retail)       [address]       [place], [place]  [address]       Ph: [phone]       Fax: [phone]   RxID:   [id number]   Current Allergies (reviewed today): ! CODEINE ! * TUSSINEX ! * TRAMADOL

## 2010-03-14 NOTE — Medication Information (Signed)
Summary: Formulary Letter Regarding Avodart/[facility]  Formulary Letter Regarding [facility]odart/[facility]   Imported By: Lanelle Bal 07/26/2009 09:55:05  _____________________________________________________________________  External Attachment:    Type:   Image     Comment:   External Document

## 2010-03-14 NOTE — Progress Notes (Signed)
Summary: Lansoprazole clarification from [facility]  Phone Note From Pharmacy Call back at [phone]   Caller: Stephie from [facility] Call For: Dr. Hetty Ely  Summary of Call: Arrie Aran from [facility] called re Lansoprazole 30mg  take one capsule 45 mins prior to breakfast. Pharmacy wants to know diagnosis and when pt wil be reevaluated to see if need continue taking this medication.  I tried to call pharmacy to see if can give this info on Dr. Lorenza Chick return on Wed. but I got v/m will try again in am. REf # [id number] Initial call taken by: Lewanda Rife LPN,  February 28, 2009 5:21 PM  Follow-up for Phone Call        Spoke with Roddie Mc at [facility]  ref # [id number]. She said was OK to wait on Dr. Lorenza Chick return 12/31/09 but request answer by 01/01/10 on diagnosis and when will pt be reevaluated for this problem. Lewanda Rife LPN  March 01, 2009 8:57 AM   Additional Follow-up for Phone Call Additional follow up Details #1::        530.81, seeing him in April. Additional Follow-up by: Shaune Leeks MD,  March 02, 2009 7:23 AM    Additional Follow-up for Phone Call Additional follow up Details #2::    I called [facility] [phone] and left v/m message for information requested for ref # [id number].Lewanda Rife LPN  March 02, 2009 8:09 AM

## 2010-03-14 NOTE — Progress Notes (Signed)
Summary: Medication change  Phone Note Outgoing Call   Summary of Call: I tried to call patient.  He didn't answer.  I want patient to stop the lisinopril.  I don't think this is the most likely cause, but it is possible that this med could be related to the cough.  I would stop it at least until we have an answer from ENT.   Please call him and tell this for me (and update med list).  thanks.  Initial call taken by: Crawford Givens MD,  November 28, 2009 2:12 PM  Follow-up for Phone Call        Left message on machine for patient to call back. Sydell Axon LPN  November 28, 2009 2:16 PM   Patient notified as instructed by telephone. Medication list updated with hold on med. until answer from ENT. Follow-up by: Sydell Axon LPN,  November 28, 2009 4:12 PM  Additional Follow-up for Phone Call Additional follow up Details #1::        noted.  Crawford Givens MD  November 28, 2009 4:13 PM     New/Updated Medications: AVODART 0.5 MG  CAPS 1TABLET DAILY AT BEDTIME, KLOR-CON 10 10 MEQ  TBCR 1 DAILY BY MOUTH, ADVAIR DISKUS 100-50 MCG/DOSE  MISC USE ONE INHALATION two times a day, LISINOPRIL 5 MG  TABS Hold until answer from ENT, SIMVASTATIN 80 MG TABS one tab by mouth at nightl., LANSOPRAZOLE 30 MG CPDR one tab by mouth 45 mins prior to brfst and again before supper, ADVIL PM 200-25 MG CAPS at bedtime for sleep, ZOLOFT 50 MG TABS Take one by mouth daily, VENTOLIN HFA 108 (90 BASE) MCG/ACT AERS 2 inhalations every 4 hours as needed, HYDROCOD POLST-CHLORPHEN POLST 10-8 MG/5ML LQCR 5 ml by mouth at bedtime as needed for cough.  AVODART 0.5 MG  CAPS (DUTASTERIDE) 1 QHS AVODART 0.5 MG  CAPS (DUTASTERIDE) 1TABLET DAILY AT BEDTIME KLOR-CON 10 10 MEQ  TBCR (POTASSIUM CHLORIDE) 1 DAILY BY MOUTH KLOR-CON 10 10 MEQ  TBCR (POTASSIUM CHLORIDE) 1 QD NEXIUM 40 MG  CPDR (ESOMEPRAZOLE MAGNESIUM) 1 once daily NEXIUM 40 MG  CPDR (ESOMEPRAZOLE MAGNESIUM) 1 once daily ADVAIR DISKUS 100-50 MCG/DOSE  MISC  (FLUTICASONE-SALMETEROL) USE ONE INHALATION BID ADVAIR DISKUS 100-50 MCG/DOSE  MISC (FLUTICASONE-SALMETEROL) USE ONE INHALATION two times a day LISINOPRIL 5 MG  TABS (LISINOPRIL) 1 QD LISINOPRIL 5 MG  TABS (LISINOPRIL) Hold until answer from ENT LISINOPRIL 5 MG  TABS (LISINOPRIL) 1 DAILY BY MOUTH PRAVASTATIN SODIUM 80 MG  TABS (PRAVASTATIN SODIUM) 1 DAILY BY MOUTH AT BEDTIME SIMVASTATIN 80 MG TABS (SIMVASTATIN) one tab by mouth at nightl. PRAVASTATIN SODIUM 80 MG  TABS (PRAVASTATIN SODIUM) 1 Q HS FLOMAX 0.4 MG  CP24 (TAMSULOSIN HCL) 1 QD FLOMAX 0.4 MG  CP24 (TAMSULOSIN HCL) 1 QD ASTELIN 137 MCG/SPRAY  SOLN (AZELASTINE HCL) 2 SQUIRTS EACH NOSTRIL once daily PRN ASTELIN 137 MCG/SPRAY  SOLN (AZELASTINE HCL) 2 SQUIRTS EACH NOSTRIL once daily PRN NABUMETONE 750 MG  TABS (NABUMETONE) 1 as needed pain dr. Ainsley Spinner NABUMETONE 750 MG  TABS (NABUMETONE) 1 as needed pain dr. Ainsley Spinner ZITHROMAX Z-PAK 250 MG TABS (AZITHROMYCIN) as dir ZITHROMAX Z-PAK 250 MG TABS (AZITHROMYCIN) as dir TUSSIONEX PENNKINETIC ER 8-10 MG/5ML LQCR (CHLORPHENIRAMINE-HYDROCODONE) one tsp at night as needed cough TUSSIONEX PENNKINETIC ER 8-10 MG/5ML LQCR (CHLORPHENIRAMINE-HYDROCODONE) one tsp at night as needed cough VICODIN 5-500 MG TABS (HYDROCODONE-ACETAMINOPHEN) one tab by mouth at night as needed for right shoulder pain. VICODIN 5-500 MG TABS (HYDROCODONE-ACETAMINOPHEN) one tab by  mouth at night as needed for right shoulder pain. LANSOPRAZOLE 30 MG CPDR (LANSOPRAZOLE) one tab by mouth 45 mins prior to brfst. LANSOPRAZOLE 30 MG CPDR (LANSOPRAZOLE) one tab by mouth 45 mins prior to brfst and again before supper ADVIL PM 200-25 MG CAPS (IBUPROFEN-DIPHENHYDRAMINE HCL) at bedtime for sleep ANUSOL-HC 25 MG SUPP (HYDROCORTISONE ACETATE) one supp per rectum three times a day for one week. ANUSOL-HC 25 MG SUPP (HYDROCORTISONE ACETATE) one supp per rectum three times a day for one week. MIRALAX   POWD (POLYETHYLENE GLYCOL 3350) As per  prep  instructions. REGLAN 10 MG  TABS (METOCLOPRAMIDE HCL) As per prep instructions. ATIVAN 0.5 MG TABS (LORAZEPAM) one tab by mouth two times a day as needed for depression. ATIVAN 0.5 MG TABS (LORAZEPAM) one tab by mouth two times a day as needed for depression. ZITHROMAX Z-PAK 250 MG TABS (AZITHROMYCIN) as dir ZITHROMAX Z-PAK 250 MG TABS (AZITHROMYCIN) as dir PROAIR HFA 108 (90 BASE) MCG/ACT AERS (ALBUTEROL SULFATE) 2 inhalations every 4 hrs as needed. PROAIR HFA 108 (90 BASE) MCG/ACT AERS (ALBUTEROL SULFATE) 2 inhalations every 4 hrs as needed. ZOLOFT 50 MG TABS (SERTRALINE HCL) Take one by mouth daily ZOLOFT 50 MG TABS (SERTRALINE HCL) 1/2 tab by mouth once daily for 6 days then 1 TAB A DAY VENTOLIN HFA 108 (90 BASE) MCG/ACT AERS (ALBUTEROL SULFATE) 2 inhalations every 4 hours as needed TESSALON 200 MG CAPS (BENZONATATE) 1 by mouth three times a day as needed for cough TESSALON 200 MG CAPS (BENZONATATE) 1 by mouth three times a day as needed for cough HYDROCOD POLST-CHLORPHEN POLST 10-8 MG/5ML LQCR (HYDROCOD POLST-CHLORPHEN POLST) 5 ml by mouth at bedtime as needed for cough ZITHROMAX Z-PAK 250 MG TABS (AZITHROMYCIN) as dir ZITHROMAX Z-PAK 250 MG TABS (AZITHROMYCIN) as dir

## 2010-03-14 NOTE — Assessment & Plan Note (Signed)
Summary: CPX//CYD   Vital Signs:  Patient profile:   55 year old male Weight:      228.75 pounds Temp:     98.4 degrees F oral Pulse rate:   84 / minute Pulse rhythm:   regular BP sitting:   128 / 84  (left arm) Cuff size:   large  Vitals Entered By: Sydell Axon LPN (May 30, 2009 8:10 AM) CC: 30 minute checkup, had a colonoscopy 07/10 by Dr. Arlyce Dice   History of Present Illness: Pt here for Comp Exam. He is having pain in the inguinal area. He also has had lower back pain in the left lower lumbar area and has the hernia on the same side. He also has swelling ventrally of the upper abd area with exertion...began with a cold in Feb.  Preventive Screening-Counseling & Management  Alcohol-Tobacco     Alcohol drinks/day: <1     Alcohol type: 6 pack a year, minimal to none liquor     Smoking Status: never     Passive Smoke Exposure: no  Caffeine-Diet-Exercise     Caffeine use/day: 3     Does Patient Exercise: yes     Type of exercise: walks     Times/week: 7  Allergies: 1)  ! Codeine 2)  ! * Tussinex 3)  ! * Tramadol  Past History:  Past Medical History: Last updated: 05/23/2006 Hyperlipidemia (10/13/1997) GERD (10/13/2001) Asthma (09/13/1999) Benign prostatic hypertrophy (05/14/2003) Hypertension (09/13/2003)  Past Surgical History: Last updated: 08/20/2008 ECHO Tr TR,MR 10/17/1999 FLEXSIG (Sevanna Ballengee) Proctitis 03/2000 CYSTOSCOPY Stone retrieval 09/2000 Cystectomy from back  Heel cord release (Dr Al Corpus) 05/23/2007 Colonoscopy Nml  (Dr Arlyce Dice) 08/20/2008  Family History: Last updated: 05/30/2009 Father dec 70 Lymphoma, recurrent  HTN Mother dec  28  HTN Aspiration pneumonia Brother A 22 Htn  Social History: Last updated: 05/23/2006 Occupation:Sheet metal fabrication, [employer] Never Smoked Alcohol use-no Drug use-no  Risk Factors: Alcohol Use: <1 (05/30/2009) Caffeine Use: 3 (05/30/2009) Exercise: yes (05/30/2009)  Risk Factors: Smoking Status:  never (05/30/2009) Passive Smoke Exposure: no (05/30/2009)  Family History: Father dec 70 Lymphoma, recurrent  HTN Mother dec  48  HTN Aspiration pneumonia Brother A 54 Htn  Review of Systems General:  Complains of fatigue; denies chills, fever, sweats, weakness, and weight loss. Eyes:  Denies blurring, discharge, eye irritation, eye pain, and itching. ENT:  Complains of decreased hearing and ringing in ears; denies earache; minimal of both. CV:  Denies chest pain or discomfort, fainting, fatigue, palpitations, shortness of breath with exertion, swelling of feet, and swelling of hands. Resp:  Complains of shortness of breath and wheezing; denies cough; episodically, reacts to pollen as well.Marland Kitchen GI:  Denies abdominal pain, bloody stools, change in bowel habits, constipation, dark tarry stools, diarrhea, indigestion, loss of appetite, nausea, vomiting, vomiting blood, and yellowish skin color. GU:  Complains of nocturia; denies discharge, dysuria, and urinary frequency; once. MS:  Complains of cramps; denies joint pain, low back pain, muscle aches, and stiffness; rare. Derm:  Denies dryness, itching, and rash; foot dyshydrosis. Neuro:  Denies numbness, poor balance, tingling, and tremors.  Physical Exam  General:  Well-developed,well-nourished,in no acute distress; alert,appropriate and cooperative throughout examination, mildly overweight. Head:  Normocephalic and atraumatic without obvious abnormalities. No apparent alopecia or balding. Sinuses NT. Eyes:  Conjunctiva clear bilaterally but mildly injected palpebrally.  Ears:  External ear exam shows no significant lesions or deformities.  Otoscopic examination reveals clear canals, tympanic membranes are intact bilaterally without bulging,  retraction, inflammation or discharge. Hearing is grossly normal bilaterally. Nose:  External nasal examination shows no deformity or inflammation. Nasal mucosa are pink and moist without lesions or  exudates. Min inflammation. Mouth:  Oral mucosa and oropharynx without lesions or exudates.  Teeth in good repair. Neck:  No deformities, masses, or tenderness noted. Chest Wall:  No deformities, masses, tenderness or gynecomastia noted. Breasts:  No masses or gynecomastia noted Lungs:  Normal respiratory effort, chest expands symmetrically. Lungs are clear to auscultation, no crackles or wheezes. Heart:  Normal rate and regular rhythm. S1 and S2 normal without gallop, click, rub or other extra sounds. I-II/VI sys murmur best along LLSB, longstanding since childhood. Abdomen:  Bowel sounds positive,abdomen soft and non-tender without masses, organomegaly noted. Chronic L inguinal hernia. Abdomen mildly protuberant. Mild ventral hernia w/o defect noted. Rectal:  No external abnormalities noted. Normal sphincter tone. No rectal masses or tenderness. G neg. Genitalia:  Testes bilaterally descended without nodularity, tenderness or masses. No scrotal masses or lesions. No penis lesions or urethral discharge. L inguinal,hernis chronically descended but reducible. Prostate:  Prostate gland firm and smooth, no enlargement, nodularity, tenderness, mass, asymmetry or induration. 30gms. Msk:  R shoulder, exquisitely tender over the right A/C joint area. Inability to lift the arm above the shoulder. No assymetry noted. Pulses:  R and L carotid,radial,femoral,dorsalis pedis and posterior tibial pulses are full and equal bilaterally Extremities:  No clubbing, cyanosis, edema, or deformity noted with normal full range of motion of all joints.  No inflamm of fingers noted. Mild inflammation og the intertrig space of toes right foot. Neurologic:  No cranial nerve deficits noted. Station and gait are normal. Sensory, motor and coordinative functions appear intact. Skin:  Intact without suspicious lesions or rashes Cervical Nodes:  No lymphadenopathy noted Inguinal Nodes:  No significant adenopathy Psych:   Cognition and judgment appear intact. Alert and cooperative with normal attention span and concentration. No apparent delusions, illusions, hallucinations, brighter today and spontaneously smiling.   Impression & Recommendations:  Problem # 1:  HEALTH MAINTENANCE EXAM (ICD-V70.0) Assessment Comment Only  Reviewed preventive care protocols, scheduled due services, and updated immunizations.  Problem # 2:  INGUINAL HERNIA, LEFT (ICD-550.90) Assessment: Deteriorated Now chronically out. Refer toDr Lemar Livings for eval. Orders: Surgical Referral (Surgery)  Problem # 3:  ABDOMINAL WALL HERNIA (ICD-553.20) Assessment: New No defect felt. Have Dr Wilford Sports. Orders: Surgical Referral (Surgery)  Problem # 4:  ANXIETY DEPRESSION (ICD-300.4) Assessment: Improved Stable.  Problem # 5:  HEMOCCULT POSITIVE STOOL (ICD-578.1) Assessment: Improved None seen since colonoscopy.  Problem # 6:  SPECIAL SCREENING MALIGNANT NEOPLASM OF PROSTATE (ICD-V76.44) Assessment: Unchanged Stable PSA and exam.  Problem # 7:  DYSHIDROSIS OF FEET (ICD-705.81) Assessment: Unchanged Will prescribe cream when he lets me know.  Problem # 8:  ALLERGIC RHINITIS (ICD-477.9) Assessment: Unchanged Stable.  Problem # 9:  HYPERGLYCEMIA, FASTING (ICD-790.6) Assessment: Comment Only Euglycemic now.  Problem # 10:  HYPERTENSION (ICD-401.9) Assessment: Unchanged Good control. His updated medication list for this problem includes:    Lisinopril 5 Mg Tabs (Lisinopril) .Marland Kitchen... 1 daily by mouth  BP today: 128/84 Prior BP: 118/80 (12/09/2008)  Labs Reviewed: K+: 4.1 (05/24/2009) Creat: : 0.9 (05/24/2009)   Chol: 172 (05/24/2009)   HDL: 62.50 (05/24/2009)   LDL: 91 (05/24/2009)   TG: 93.0 (05/24/2009)  Problem # 11:  HYPERLIPIDEMIA (ICD-272.4) Assessment: Improved Good control, cont Simvas. His updated medication list for this problem includes:    Simvastatin 80 Mg Tabs (Simvastatin) .Marland KitchenMarland KitchenMarland KitchenMarland Kitchen  One tab by mouth at  nightl.  Labs Reviewed: SGOT: 32 (05/24/2009)   SGPT: 48 (05/24/2009)   HDL:62.50 (05/24/2009), 54.00 (06/07/2008)  LDL:91 (05/24/2009), 96 (06/07/2008)  Chol:172 (05/24/2009), 165 (06/07/2008)  Trig:93.0 (05/24/2009), 74.0 (06/07/2008)  Complete Medication List: 1)  Avodart 0.5 Mg Caps (Dutasteride) .Marland Kitchen.. 1tablet daily at bedtime 2)  Klor-con 10 10 Meq Tbcr (Potassium chloride) .Marland Kitchen.. 1 daily by mouth 3)  Advair Diskus 100-50 Mcg/dose Misc (Fluticasone-salmeterol) .... Use one inhalation two times a day 4)  Lisinopril 5 Mg Tabs (Lisinopril) .Marland Kitchen.. 1 daily by mouth 5)  Simvastatin 80 Mg Tabs (Simvastatin) .... One tab by mouth at nightl. 6)  Lansoprazole 30 Mg Cpdr (Lansoprazole) .... One tab by mouth 45 mins prior to brfst. 7)  Advil Pm 200-25 Mg Caps (Ibuprofen-diphenhydramine hcl) .... At bedtime for sleep 8)  Zoloft 50 Mg Tabs (Sertraline hcl) .... Take one by mouth daily 9)  Ventolin Hfa 108 (90 Base) Mcg/act Aers (Albuterol sulfate) .... 2 inhalations every 4 hours as needed  Patient Instructions: 1)  Refer to Dr Lemar Livings. 2)  Call late May for appt in Oct/Nov with Vit D prior. 3)  tart Vit D 1000Iu two times a day   Current Allergies (reviewed today): ! CODEINE ! * TUSSINEX ! * TRAMADOL

## 2010-03-16 NOTE — Progress Notes (Signed)
Summary: refill request for lisinopril  Phone Note Refill Request Message from:  Fax from Pharmacy  Refills Requested: Medication #1:  LISINOPRIL 5 MG  TABS Hold until answer from ENT   Last Refilled: 12/01/2009 Faxed request from [facility] [place].  There is a hold on this in pt's chart pending note from ENT.  Please advise.  Initial call taken by: Lowella Petties CMA, AAMA,  March 09, 2010 12:25 PM  Follow-up for Phone Call        I see nothing in the notes from ENT that intimates holoding Lisinopril. Did they consider calling Dr Grayland Ormond of [facility] E[facility] and asking about "a Hold"? Follow-up by: Shaune Leeks MD,  March 09, 2010 1:13 PM  Additional Follow-up for Phone Call Additional follow up Details #1::        [facility] for pt to call back.           Lowella Petties CMA, AAMA  March 10, 2010 9:32 AM I spoke with pt and he never stopped the lisinopril.  Refills sent to pharmacy. Additional Follow-up by: Lowella Petties CMA, AAMA,  March 10, 2010 1:02 PM    Prescriptions: LISINOPRIL 5 MG  TABS (LISINOPRIL) Hold until answer from ENT  #30 x 11   Entered by:   Lowella Petties CMA, AAMA   Authorized by:   Shaune Leeks MD   Signed by:   Lowella Petties CMA, AAMA on 03/10/2010   Method used:   Electronically to        [facility] [address] (retail)       [address]       [place], [place]  [address]       Ph: [phone]       Fax: [phone]   RxID:   [id number]

## 2010-03-16 NOTE — Letter (Signed)
Summary: Dr.P.Scott Bennett,[place] [facility],Note  Dr.P.Scott Bennett,[place] [facility],Note   Imported By: Beau Fanny 01/24/2010 10:21:04  _____________________________________________________________________  External Attachment:    Type:   Image     Comment:   External Document

## 2010-03-16 NOTE — Assessment & Plan Note (Signed)
Summary: DISCUSS MEDS   Vital Signs:  Patient profile:   55 year old male Weight:      242 pounds Temp:     98.6 degrees F oral Pulse rate:   80 / minute Pulse rhythm:   regular BP sitting:   118 / 76  (left arm) Cuff size:   large  Vitals Entered By: Sydell Axon LPN (February 09, 2010 10:08 AM) CC: Discuss changing his Avodart to Finasteride or Tamsulosin per his [employer]   History of Present Illness: Pt's insurance is changing and they will not pay for Avodart any longer. He was told substiutues would be Fiasteride or Tamsulsin. He has had good BPH control with the Avodart. OBTW he also got his hand caught in wood, twisted and has had pain in the left thumb area since...about a month ago.  Problems Prior to Update: 1)  Dysphonia  (OZH-086.57) 2)  Viral Infection  (ICD-079.99) 3)  Unspecified Vitamin D Deficiency  (ICD-268.9) 4)  Abdominal Wall Hernia  (ICD-553.20) 5)  Inguinal Hernia, Left  (ICD-550.90) 6)  Anxiety Depression  (ICD-300.4) 7)  Hemoccult Positive Stool  (ICD-578.1) 8)  Special Screening Malig Neoplasms Other Sites  (ICD-V76.49) 9)  Health Maintenance Exam  (ICD-V70.0) 10)  Special Screening Malignant Neoplasm of Prostate  (ICD-V76.44) 11)  Dyshidrosis of Feet  (ICD-705.81) 12)  Rosacea  (ICD-695.3) 13)  Allergic Rhinitis  (ICD-477.9) 14)  Nephrolithiasis, S/p Cystoscopic Retrieval  (ICD-592.0) 15)  Hyperglycemia, Fasting  (ICD-790.6) 16)  Hypertension  (ICD-401.9) 17)  Benign Prostatic Hypertrophy  (ICD-600.00) 18)  Asthma  (ICD-493.90) 19)  Gerd  (ICD-530.81) 20)  Hyperlipidemia  (ICD-272.4)  Medications Prior to Update: 1)  Avodart 0.5 Mg  Caps (Dutasteride) .Marland Kitchen.. 1tablet Daily At Bedtime 2)  Klor-Con 10 10 Meq  Tbcr (Potassium Chloride) .Marland Kitchen.. 1 Daily By Mouth 3)  Advair Diskus 100-50 Mcg/dose  Misc (Fluticasone-Salmeterol) .... Use One Inhalation Two Times A Day 4)  Lisinopril 5 Mg  Tabs (Lisinopril) .... Hold Until Answer From Ent 5)   Simvastatin 80 Mg Tabs (Simvastatin) .... One Tab By Mouth At Nightl. 6)  Lansoprazole 30 Mg Cpdr (Lansoprazole) .... One Tab By Mouth 45 Mins Prior To Brfst and Again Before Supper 7)  Advil Pm 200-25 Mg Caps (Ibuprofen-Diphenhydramine Hcl) .... At Bedtime For Sleep 8)  Zoloft 50 Mg Tabs (Sertraline Hcl) .... Take One By Mouth Daily 9)  Ventolin Hfa 108 (90 Base) Mcg/act Aers (Albuterol Sulfate) .... 2 Inhalations Every 4 Hours As Needed 10)  Hydrocod Polst-Chlorphen Polst 10-8 Mg/28ml Lqcr (Hydrocod Polst-Chlorphen Polst) .... 5 Ml By Mouth At Bedtime As Needed For Cough  Allergies: 1)  ! Codeine 2)  ! * Tussinex 3)  ! * Tramadol  Physical Exam  General:  Well-developed,well-nourished,in no acute distress; alert,appropriate and cooperative throughout examination Head:  Normocephalic and atraumatic without obvious abnormalities. No apparent alopecia or balding. Sinuses NT. Eyes:  Conjunctiva clear bilaterally.  Ears:  External ear exam shows no significant lesions or deformities.  Otoscopic examination reveals clear canals, tympanic membranes are intact bilaterally without bulging, retraction, inflammation or discharge. Hearing is grossly normal bilaterally. Nose:  External nasal examination shows no deformity or inflammation. Nasal mucosa are pink and moist without lesions or exudates.  Mouth:  Oral mucosa and oropharynx without lesions or exudates.  Teeth in good repair. Neck:  No deformities, masses, or tenderness noted. Chest Wall:  No deformities, masses, tenderness or gynecomastia noted. Lungs:  Normal respiratory effort, chest expands symmetrically. Lungs are  clear to auscultation, no crackles or wheezes. Heart:  Normal rate and regular rhythm. S1 and S2 normal without gallop, click, rub or other extra sounds. I-II/VI sys murmur best along LLSB, longstanding since childhood. Abdomen:  Protuberant.   Impression & Recommendations:  Problem # 1:  BENIGN PROSTATIC HYPERTROPHY  (ICD-600.00) Assessment Unchanged Well controlled. Change to Finasteride.  Problem # 2:  DYSPHONIA (EAV-409.81) Assessment: Improved Better after following inhalation with eating.  Problem # 3:  SPRAIN AND STRAIN OF RADIOCARPAL OF WRIST (ICD-842.02) Assessment: New Use splint. Heat in AM and ice at night.  Call if not improved. (Has mild snuff box tenderness but linear and into the wrist...apppears tendonitis.)  Problem # 4:  HYPERTENSION (ICD-401.9) Assessment: Unchanged  His updated medication list for this problem includes:    Lisinopril 5 Mg Tabs (Lisinopril) ..... Hold until answer from ent  BP today: 118/76 Prior BP: 120/82 (11/28/2009)  Labs Reviewed: K+: 4.1 (05/24/2009) Creat: : 0.9 (05/24/2009)   Chol: 172 (05/24/2009)   HDL: 62.50 (05/24/2009)   LDL: 91 (05/24/2009)   TG: 93.0 (05/24/2009)  Complete Medication List: 1)  Finasteride 5 Mg Tabs (Finasteride) .... One tab by mouth daily. 2)  Klor-con 10 10 Meq Tbcr (Potassium chloride) .Marland Kitchen.. 1 daily by mouth 3)  Advair Diskus 100-50 Mcg/dose Misc (Fluticasone-salmeterol) .... Use one inhalation two times a day 4)  Lisinopril 5 Mg Tabs (Lisinopril) .... Hold until answer from ent 5)  Simvastatin 80 Mg Tabs (Simvastatin) .... One tab by mouth at nightl. 6)  Lansoprazole 30 Mg Cpdr (Lansoprazole) .... One tab by mouth 45 mins prior to brfst and again before supper 7)  Advil Pm 200-25 Mg Caps (Ibuprofen-diphenhydramine hcl) .... At bedtime for sleep 8)  Zoloft 50 Mg Tabs (Sertraline hcl) .... Take one by mouth daily 9)  Ventolin Hfa 108 (90 Base) Mcg/act Aers (Albuterol sulfate) .... 2 inhalations every 4 hours as needed 10)  Hydrocod Polst-chlorphen Polst 10-8 Mg/63ml Lqcr (Hydrocod polst-chlorphen polst) .... 5 ml by mouth at bedtime as needed for cough Prescriptions: KLOR-CON 10 10 MEQ  TBCR (POTASSIUM CHLORIDE) 1 DAILY BY MOUTH  #90 x 3   Entered and Authorized by:   Shaune Leeks MD   Signed by:   Shaune Leeks MD on 02/09/2010   Method used:   Print then Give to Patient   RxID:   [id number] ADVAIR DISKUS 100-50 MCG/DOSE  MISC (FLUTICASONE-SALMETEROL) USE ONE INHALATION two times a day  #3 x 3   Entered and Authorized by:   Shaune Leeks MD   Signed by:   Shaune Leeks MD on 02/09/2010   Method used:   Print then Give to Patient   RxID:   [id number] FINASTERIDE 5 MG TABS (FINASTERIDE) one tab by mouth daily.  #90 x 3   Entered and Authorized by:   Shaune Leeks MD   Signed by:   Shaune Leeks MD on 02/09/2010   Method used:   Electronically to        [facility] [address] (retail)       [address]       [place], [place]  [address]       Ph: [phone]       Fax: [phone]   RxID:   [id number]    Orders Added: 1)  Est. Patient Level IV [60630]    Current Allergies (reviewed today): ! CODEINE ! * TUSSINEX ! * TRAMADOL

## 2010-03-16 NOTE — Letter (Signed)
Summary: Dr.P.Scott Bennett,[place] [facility],Note  Dr.P.Scott Bennett, [facility]ro[facility],Note   Imported By: Beau Fanny 03/07/2010 11:33:23  _____________________________________________________________________  External Attachment:    Type:   Image     Comment:   External Document

## 2010-03-16 NOTE — Progress Notes (Signed)
Summary: needs to change from avodart  Phone Note Call from Patient Call back at [phone]   Caller: Patient Summary of Call: Pt's insurance is changing and will no longer cover avodart.  He needs to change to something else.  Wants to use [facility] in [place], wants a 90 day supply of whatever you change him to.  Please call pt and let him know. Initial call taken by: Lowella Petties CMA, AAMA,  February 08, 2010 11:47 AM  Follow-up for Phone Call        Pls have pt come in to see me and bring list of generic medications his insurance will cover. Follow-up by: Shaune Leeks MD,  February 08, 2010 1:20 PM  Additional Follow-up for Phone Call Additional follow up Details #1::        Advised pt, appt made.             Lowella Petties CMA, AAMA  February 08, 2010 2:41 PM

## 2010-03-25 ENCOUNTER — Encounter: Payer: Self-pay | Admitting: Family Medicine

## 2010-05-09 ENCOUNTER — Other Ambulatory Visit: Payer: Self-pay | Admitting: Family Medicine

## 2010-05-09 DIAGNOSIS — E785 Hyperlipidemia, unspecified: Secondary | ICD-10-CM

## 2010-05-09 DIAGNOSIS — R7989 Other specified abnormal findings of blood chemistry: Secondary | ICD-10-CM

## 2010-05-09 DIAGNOSIS — N400 Enlarged prostate without lower urinary tract symptoms: Secondary | ICD-10-CM

## 2010-05-09 DIAGNOSIS — I10 Essential (primary) hypertension: Secondary | ICD-10-CM

## 2010-05-09 DIAGNOSIS — K921 Melena: Secondary | ICD-10-CM

## 2010-05-09 DIAGNOSIS — E559 Vitamin D deficiency, unspecified: Secondary | ICD-10-CM

## 2010-05-10 ENCOUNTER — Other Ambulatory Visit (INDEPENDENT_AMBULATORY_CARE_PROVIDER_SITE_OTHER): Payer: 59 | Admitting: Family Medicine

## 2010-05-10 DIAGNOSIS — E559 Vitamin D deficiency, unspecified: Secondary | ICD-10-CM

## 2010-05-10 DIAGNOSIS — K921 Melena: Secondary | ICD-10-CM

## 2010-05-10 DIAGNOSIS — I10 Essential (primary) hypertension: Secondary | ICD-10-CM

## 2010-05-10 DIAGNOSIS — R7989 Other specified abnormal findings of blood chemistry: Secondary | ICD-10-CM

## 2010-05-10 DIAGNOSIS — E785 Hyperlipidemia, unspecified: Secondary | ICD-10-CM

## 2010-05-10 DIAGNOSIS — N400 Enlarged prostate without lower urinary tract symptoms: Secondary | ICD-10-CM

## 2010-05-10 LAB — HEPATIC FUNCTION PANEL
AST: 36 U/L (ref 0–37)
Albumin: 3.8 g/dL (ref 3.5–5.2)
Alkaline Phosphatase: 65 U/L (ref 39–117)
Total Protein: 6.6 g/dL (ref 6.0–8.3)

## 2010-05-10 LAB — MICROALBUMIN / CREATININE URINE RATIO
Creatinine,U: 120.1 mg/dL
Microalb Creat Ratio: 0.4 mg/g (ref 0.0–30.0)

## 2010-05-10 LAB — CBC WITH DIFFERENTIAL/PLATELET
Basophils Absolute: 0 10*3/uL (ref 0.0–0.1)
Eosinophils Absolute: 0.1 10*3/uL (ref 0.0–0.7)
MCHC: 34.2 g/dL (ref 30.0–36.0)
MCV: 94.8 fl (ref 78.0–100.0)
Monocytes Absolute: 0.6 10*3/uL (ref 0.1–1.0)
Neutrophils Relative %: 66.6 % (ref 43.0–77.0)
Platelets: 218 10*3/uL (ref 150.0–400.0)
WBC: 8 10*3/uL (ref 4.5–10.5)

## 2010-05-10 LAB — PSA: PSA: 0.2 ng/mL (ref 0.10–4.00)

## 2010-05-10 LAB — BASIC METABOLIC PANEL
Chloride: 105 mEq/L (ref 96–112)
Potassium: 4.5 mEq/L (ref 3.5–5.1)

## 2010-05-10 LAB — LIPID PANEL: Cholesterol: 171 mg/dL (ref 0–200)

## 2010-05-17 ENCOUNTER — Encounter: Payer: Self-pay | Admitting: Family Medicine

## 2010-05-17 ENCOUNTER — Ambulatory Visit (INDEPENDENT_AMBULATORY_CARE_PROVIDER_SITE_OTHER): Payer: 59 | Admitting: Family Medicine

## 2010-05-17 DIAGNOSIS — I10 Essential (primary) hypertension: Secondary | ICD-10-CM

## 2010-05-17 DIAGNOSIS — K921 Melena: Secondary | ICD-10-CM

## 2010-05-17 DIAGNOSIS — J45909 Unspecified asthma, uncomplicated: Secondary | ICD-10-CM

## 2010-05-17 DIAGNOSIS — E559 Vitamin D deficiency, unspecified: Secondary | ICD-10-CM

## 2010-05-17 DIAGNOSIS — R7989 Other specified abnormal findings of blood chemistry: Secondary | ICD-10-CM

## 2010-05-17 DIAGNOSIS — K219 Gastro-esophageal reflux disease without esophagitis: Secondary | ICD-10-CM

## 2010-05-17 DIAGNOSIS — J309 Allergic rhinitis, unspecified: Secondary | ICD-10-CM

## 2010-05-17 DIAGNOSIS — F341 Dysthymic disorder: Secondary | ICD-10-CM

## 2010-05-17 DIAGNOSIS — S63529A Sprain of radiocarpal joint of unspecified wrist, initial encounter: Secondary | ICD-10-CM

## 2010-05-17 NOTE — Patient Instructions (Signed)
Continue meds as they are. Return if trigerring of fingers worsens. Start Allegra or Claritin regularly for sputum production.

## 2010-05-17 NOTE — Assessment & Plan Note (Signed)
Cont curr tx, may need referral for trigger fingers in the future.

## 2010-05-17 NOTE — Assessment & Plan Note (Signed)
Well controlled, cont curr meds. BP: 120/78 mmHg

## 2010-05-17 NOTE — Assessment & Plan Note (Signed)
Stable

## 2010-05-17 NOTE — Assessment & Plan Note (Signed)
Adequate 

## 2010-05-17 NOTE — Assessment & Plan Note (Signed)
Start Allegra or Claritin regularly daily for sputum production.

## 2010-05-17 NOTE — Assessment & Plan Note (Signed)
Well controlled. He notes sxs much improved and doesn't get anxious like he used to. Cont Sertraline.

## 2010-05-17 NOTE — Assessment & Plan Note (Signed)
Euglycemic today. Cont watching sweet and carb intake. Labs reviewed, micro alb nml.

## 2010-05-17 NOTE — Assessment & Plan Note (Signed)
Negative today.

## 2010-05-17 NOTE — Progress Notes (Signed)
  Subjective:    Patient ID: Erik Allen, male    DOB: Feb 16, 1955, 55 y.o.   MRN: [id number]  HPI Pt here for Comp Exam. He has had left hand pain since his hand was caught under a railroad tie last year. He is still functional and hasn't dropped anyhting. It hurts when he makes a fist.   Review of Systems  Constitutional: Positive for fatigue. Negative for fever, chills, diaphoresis, appetite change and unexpected weight change.  HENT: Positive for tinnitus. Negative for hearing loss, ear pain and ear discharge.   Eyes: Negative for pain, discharge and visual disturbance.  Respiratory: Positive for cough. Negative for shortness of breath and wheezing.        Coughs enough at times to nauseate himself and get SOB.  Cardiovascular: Negative for chest pain and palpitations.       No SOB w/ exertion  Gastrointestinal: Negative for nausea, vomiting, abdominal pain, diarrhea, constipation and blood in stool.       No heartburn or swallowing problems.  Genitourinary: Negative for dysuria, frequency and difficulty urinating.       No nocturia  Musculoskeletal: Negative for myalgias, back pain and arthralgias.  Skin: Negative for rash.       No itching or dryness.  Neurological: Negative for tremors and numbness.       No tingling or balance problems.  Hematological: Negative for adenopathy. Does not bruise/bleed easily.  Psychiatric/Behavioral: Negative for dysphoric mood and agitation.       Objective:   Physical Exam  Constitutional: He is oriented to person, place, and time. He appears well-developed and well-nourished. No distress.  HENT:  Head: Normocephalic and atraumatic.  Right Ear: External ear normal.  Left Ear: External ear normal.  Nose: Nose normal.  Mouth/Throat: Oropharynx is clear and moist.  Eyes: Conjunctivae and EOM are normal. Pupils are equal, round, and reactive to light. Right eye exhibits no discharge. Left eye exhibits no discharge. No scleral icterus.    Neck: Normal range of motion. Neck supple. No thyromegaly present.  Cardiovascular: Normal rate, regular rhythm, normal heart sounds and intact distal pulses.   No murmur heard. Pulmonary/Chest: Effort normal and breath sounds normal. No respiratory distress. He has no wheezes.  Abdominal: Soft. Bowel sounds are normal. He exhibits no distension and no mass. There is no tenderness. There is no rebound and no guarding.  Genitourinary: Rectum normal, prostate normal and penis normal. Guaiac negative stool.  Musculoskeletal: Normal range of motion. He exhibits no edema.  Lymphadenopathy:    He has no cervical adenopathy.  Neurological: He is alert and oriented to person, place, and time. Coordination normal.  Skin: Skin is warm and dry. No rash noted. He is not diaphoretic.  Psychiatric: He has a normal mood and affect. His behavior is normal. Judgment and thought content normal.          Assessment & Plan:  Comp Exam

## 2010-06-02 ENCOUNTER — Other Ambulatory Visit: Payer: Self-pay | Admitting: *Deleted

## 2010-06-02 MED ORDER — SERTRALINE HCL 50 MG PO TABS: ORAL_TABLET | ORAL | Status: DC

## 2010-06-12 ENCOUNTER — Telehealth: Payer: Self-pay | Admitting: *Deleted

## 2010-06-12 NOTE — Telephone Encounter (Signed)
Pt is asking if ok for him to drink grapefruit juice if he takes simvastatin, he read somewhere he wasn't supposed to.  Please advise.

## 2010-06-13 NOTE — Telephone Encounter (Signed)
Pls let the pt know: the less grapefruit juice the better and as far from taking the medicine as possible. Consistency is the most important thing.

## 2010-06-14 NOTE — Telephone Encounter (Signed)
Advised pt

## 2010-06-19 ENCOUNTER — Other Ambulatory Visit: Payer: Self-pay | Admitting: *Deleted

## 2010-06-19 MED ORDER — SIMVASTATIN 80 MG PO TABS: 80.0000 mg | ORAL_TABLET | Freq: Every day | ORAL | Status: DC

## 2010-07-07 ENCOUNTER — Other Ambulatory Visit: Payer: Self-pay | Admitting: Family Medicine

## 2010-07-07 ENCOUNTER — Other Ambulatory Visit: Payer: Self-pay | Admitting: *Deleted

## 2010-07-07 MED ORDER — LANSOPRAZOLE 30 MG PO CPDR: 30.0000 mg | DELAYED_RELEASE_CAPSULE | Freq: Every day | ORAL | Status: DC

## 2010-10-04 ENCOUNTER — Ambulatory Visit (INDEPENDENT_AMBULATORY_CARE_PROVIDER_SITE_OTHER): Payer: 59 | Admitting: Family Medicine

## 2010-10-04 ENCOUNTER — Encounter: Payer: Self-pay | Admitting: Family Medicine

## 2010-10-04 VITALS — BP 124/74 | HR 88 | Temp 98.1°F | Ht 69.0 in | Wt 237.2 lb

## 2010-10-04 DIAGNOSIS — G5600 Carpal tunnel syndrome, unspecified upper limb: Secondary | ICD-10-CM

## 2010-10-04 DIAGNOSIS — G5601 Carpal tunnel syndrome, right upper limb: Secondary | ICD-10-CM | POA: Insufficient documentation

## 2010-10-04 NOTE — Progress Notes (Signed)
  Subjective:    Patient ID: Konrad Felix, male    DOB: 1955-11-23, 55 y.o.   MRN: [id number]  HPI Pt here for right wrist pain during the night that emanates from the carpal tunnel area and travels retrograde up the forearm. He also wakes up with his wrist in a fist and can't release his fingers without causing extreme pain.    Review of SystemsNoncontributory except as above.       Objective:   Physical Exam  Constitutional: He appears well-developed and well-nourished. No distress.  HENT:  Head: Normocephalic and atraumatic.  Right Ear: External ear normal.  Left Ear: External ear normal.  Nose: Nose normal.  Mouth/Throat: Oropharynx is clear and moist.  Eyes: Conjunctivae and EOM are normal. Pupils are equal, round, and reactive to light. Right eye exhibits no discharge. Left eye exhibits no discharge.  Neck: Normal range of motion. Neck supple.  Cardiovascular: Normal rate and regular rhythm.   Pulmonary/Chest: Effort normal and breath sounds normal. He has no wheezes.  Musculoskeletal: Normal range of motion. He exhibits no edema.       Pos Tinel's and extremely positive Phalen's of the right wrist.  Lymphadenopathy:    He has no cervical adenopathy.  Skin: He is not diaphoretic.          Assessment & Plan:

## 2010-10-04 NOTE — Patient Instructions (Signed)
Refer to Hydrographic surveyor. Wear cockup splint as directed.

## 2010-10-04 NOTE — Assessment & Plan Note (Signed)
Refer to Hydrographic surveyor. May need release. Prescribed cockup splint to be worn all the time for a week or two and then at night and whenever doing activity with his right wrist. Use heat and ice as described.

## 2010-10-09 ENCOUNTER — Other Ambulatory Visit: Payer: Self-pay | Admitting: Family Medicine

## 2010-10-09 NOTE — Telephone Encounter (Signed)
Received refill request electronically from pharmacy. Is it okay to refill this medication?

## 2010-10-14 HISTORY — PX: OTHER SURGICAL HISTORY: SHX169

## 2010-11-02 ENCOUNTER — Encounter (HOSPITAL_BASED_OUTPATIENT_CLINIC_OR_DEPARTMENT_OTHER)
Admission: RE | Admit: 2010-11-02 | Discharge: 2010-11-02 | Disposition: A | Discharge status: Home / Self Care | Payer: 59 | Source: Ambulatory Visit | Attending: Orthopedic Surgery | Admitting: Orthopedic Surgery

## 2010-11-02 LAB — BASIC METABOLIC PANEL
CO2: 25 mEq/L (ref 19–32)
Chloride: 103 mEq/L (ref 96–112)
Glucose, Bld: 112 mg/dL — ABNORMAL HIGH (ref 70–99)
Potassium: 4.2 mEq/L (ref 3.5–5.1)
Sodium: 139 mEq/L (ref 135–145)

## 2010-11-03 ENCOUNTER — Ambulatory Visit (HOSPITAL_BASED_OUTPATIENT_CLINIC_OR_DEPARTMENT_OTHER)
Admission: RE | Admit: 2010-11-03 | Discharge: 2010-11-03 | Disposition: A | Discharge status: Home / Self Care | Payer: 59 | Source: Ambulatory Visit | Attending: Orthopedic Surgery | Admitting: Orthopedic Surgery

## 2010-11-03 DIAGNOSIS — Z01812 Encounter for preprocedural laboratory examination: Secondary | ICD-10-CM | POA: Insufficient documentation

## 2010-11-03 DIAGNOSIS — F411 Generalized anxiety disorder: Secondary | ICD-10-CM | POA: Insufficient documentation

## 2010-11-03 DIAGNOSIS — K219 Gastro-esophageal reflux disease without esophagitis: Secondary | ICD-10-CM | POA: Insufficient documentation

## 2010-11-03 DIAGNOSIS — E669 Obesity, unspecified: Secondary | ICD-10-CM | POA: Insufficient documentation

## 2010-11-03 DIAGNOSIS — Z01810 Encounter for preprocedural cardiovascular examination: Secondary | ICD-10-CM | POA: Insufficient documentation

## 2010-11-03 DIAGNOSIS — M65839 Other synovitis and tenosynovitis, unspecified forearm: Secondary | ICD-10-CM | POA: Insufficient documentation

## 2010-11-03 DIAGNOSIS — J45909 Unspecified asthma, uncomplicated: Secondary | ICD-10-CM | POA: Insufficient documentation

## 2010-11-03 DIAGNOSIS — I10 Essential (primary) hypertension: Secondary | ICD-10-CM | POA: Insufficient documentation

## 2010-11-03 DIAGNOSIS — M65849 Other synovitis and tenosynovitis, unspecified hand: Secondary | ICD-10-CM | POA: Insufficient documentation

## 2010-11-03 LAB — POCT HEMOGLOBIN-HEMACUE: Hemoglobin: 14.7 g/dL (ref 13.0–17.0)

## 2010-11-06 ENCOUNTER — Encounter: Payer: Self-pay | Admitting: Family Medicine

## 2010-11-06 NOTE — Op Note (Signed)
NAME:  Erik Allen, ORVIS                    ACCOUNT NO.:  MEDICAL RECORD NO.:  [id number]  LOCATION:                                 FACILITY:  PHYSICIAN:  Cindee Salt, M.D.            DATE OF BIRTH:  DATE OF PROCEDURE:  11/03/2010 DATE OF DISCHARGE:                              OPERATIVE REPORT   PREOPERATIVE DIAGNOSIS:  Stenosing tenosynovitis; right middle, ring, and little fingers.  POSTOPERATIVE DIAGNOSIS:  Stenosing tenosynovitis; right middle, ring, and little fingers.  OPERATION:  Release of A1 pulley; right middle, ring, and little fingers.  SURGEON:  Cindee Salt, MD  ANESTHESIA:  Forearm-based IV regional, local infiltration.  ANESTHESIOLOGIST:  Burna Forts, MD  HISTORY:  The patient is a 55 year old male with a history of triggering of the middle and little fingers discretely with a nodule present and catching of the ring finger to a lesser extent.  He has not responded to conservative treatment and has elected to undergo a surgical release of the A1 pulleys of the 3 fingers.  Pre, peri, and postoperative courses have been discussed along with the risks and complications.  He is aware that there is no guarantee with the surgery, possibility of infection; recurrence of injury to arteries, nerves, tendons; incomplete relief of symptoms, dystrophy.  In the preoperative area, the patient is seen, extremity marked by both the patient and surgeon, antibiotic given.  DESCRIPTION OF PROCEDURE:  The patient was brought to the operating room where a forearm-based IV regional anesthetic was carried out without difficulty, was prepped using ChloraPrep, supine position, right arm free.  A 3-minute dry time was allowed.  Time-out taken, confirming the patient and procedure.  After adequate anesthesia was allowed, an oblique incision was made over the A1 pulley of the middle finger of right hand, carried down through the subcutaneous tissue.  Bleeders were electrocauterized  with bipolar.  Neurovascular structures were protected.  The A1 pulley was identified, released in its radial aspect. A significant tenosynovitis proximally was present with adherence of the superficialis and profundus.  This was debrided.  The finger was moved through a full range motion and the patient could still move his arm. The separate incision was then made over the middle finger when triggering of the middle was noted.  The ring finger was attended to. The incision made obliquely, carried down through the subcutaneous tissue.  Bleeders were again electrocauterized with bipolar.  The A1 pulley was identified after retractor was placed, protecting neurovascular structures.  This was released in its radial aspect.  A partial tenosynovectomy performed on that finger also.  A separate incision was then made on the little finger and carried down through subcutaneous tissue.  Bleeders were electrocauterized with bipolar. Neurovascular structures were protected.  The A1 pulley was released in its radial aspect.  A small incision made centrally in A2 as was done in the middle and ring.  A partial synovectomy was performed with very adherent tenosynovial tissue proximally.  Again, the fingers were able to be placed through a full range motion by the patient.  No further triggering was noted.  Wounds  were copiously irrigated with saline.  The skin was then closed with interrupted 5-0 Vicryl Rapide sutures.  A local infiltration with 0.25% Marcaine without epinephrine was given to each of the wound, 9 mL was used.  A sterile compressive dressing was applied.  With deflation of the tourniquet, all fingers immediately pinked.  He was taken to the recovery room for observation in satisfactory condition.  He will be discharged home to return to the [facility] [place] in 1 week on Vicodin.          ______________________________ Cindee Salt, M.D.     GK/MEDQ  D:  11/03/2010  T:   11/03/2010  Job:  [id number]  cc:   [name], MD Fax: [phone]

## 2011-01-26 ENCOUNTER — Ambulatory Visit (INDEPENDENT_AMBULATORY_CARE_PROVIDER_SITE_OTHER): Payer: 59 | Admitting: Family Medicine

## 2011-01-26 ENCOUNTER — Encounter: Payer: Self-pay | Admitting: Family Medicine

## 2011-01-26 DIAGNOSIS — J329 Chronic sinusitis, unspecified: Secondary | ICD-10-CM

## 2011-01-26 MED ORDER — BENZONATATE 200 MG PO CAPS: 200.0000 mg | ORAL_CAPSULE | Freq: Three times a day (TID) | ORAL | Status: AC | PRN

## 2011-01-26 MED ORDER — POTASSIUM CHLORIDE 10 MEQ PO TBCR: 10.0000 meq | EXTENDED_RELEASE_TABLET | Freq: Every day | ORAL | Status: DC

## 2011-01-26 MED ORDER — AZITHROMYCIN 250 MG PO TABS: ORAL_TABLET | ORAL | Status: AC

## 2011-01-26 MED ORDER — FLUTICASONE-SALMETEROL 100-50 MCG/DOSE IN AEPB: 2.0000 | INHALATION_SPRAY | Freq: Every day | RESPIRATORY_TRACT | Status: DC

## 2011-01-26 NOTE — Patient Instructions (Signed)
Start the antibiotics and take the tessalon for cough.  Drink plenty of fluids, take use your inhalers, and gargle with warm salt water for your throat.  This should gradually improve.  Take care.  Let us know if you have other concerns.

## 2011-01-26 NOTE — Progress Notes (Signed)
duration of symptoms: ~2 weeks with a cough initially.  Started taking otc cough meds, robitussin, w/o much relief Rhinorrhea:not now but yes prev Congestion: yes ear pain: yes sore throat: yes Cough:yes, bad coughing fits, no sputum, worse with deep breath Myalgias: yes other concerns: used SABA with some relief prev, but hasn't used much recently.    ROS: See HPI.  Otherwise negative.    Meds, vitals, and allergies reviewed.   GEN: nad, alert and oriented HEENT: mucous membranes moist, TM w/o erythema, nasal epithelium injected, OP with cobblestoning, R max sinus ttp NECK: supple w/o LA CV: rrr. PULM: ctab, no inc wob, no wheeze ABD: soft, +bs EXT: no edema

## 2011-01-28 ENCOUNTER — Encounter: Payer: Self-pay | Admitting: Family Medicine

## 2011-01-28 DIAGNOSIS — J019 Acute sinusitis, unspecified: Secondary | ICD-10-CM | POA: Insufficient documentation

## 2011-01-28 NOTE — Assessment & Plan Note (Signed)
Nontoxic, start zmax, use SABA and tessalon for cough and f/u prn.  He agrees.

## 2011-02-05 ENCOUNTER — Other Ambulatory Visit: Payer: Self-pay | Admitting: Family Medicine

## 2011-02-26 ENCOUNTER — Other Ambulatory Visit: Payer: Self-pay | Admitting: Family Medicine

## 2011-03-05 ENCOUNTER — Encounter: Payer: Self-pay | Admitting: Family Medicine

## 2011-03-05 ENCOUNTER — Ambulatory Visit (INDEPENDENT_AMBULATORY_CARE_PROVIDER_SITE_OTHER): Payer: 59 | Admitting: Family Medicine

## 2011-03-05 DIAGNOSIS — J209 Acute bronchitis, unspecified: Secondary | ICD-10-CM

## 2011-03-05 DIAGNOSIS — J45901 Unspecified asthma with (acute) exacerbation: Secondary | ICD-10-CM

## 2011-03-05 DIAGNOSIS — J45909 Unspecified asthma, uncomplicated: Secondary | ICD-10-CM

## 2011-03-05 MED ORDER — AZITHROMYCIN 250 MG PO TABS: ORAL_TABLET | ORAL | Status: AC

## 2011-03-05 MED ORDER — PREDNISONE 20 MG PO TABS: 40.0000 mg | ORAL_TABLET | Freq: Every day | ORAL | Status: AC

## 2011-03-05 NOTE — Progress Notes (Signed)
  Patient Name: Erik Allen Date of Birth: 12-23-1955 Age: 56 y.o. Medical Record Number: [id number] Gender: male Date of Encounter: 03/05/2011  History of Present Illness:  Erik Allen is a 56 y.o. very pleasant male patient who presents with the following:  Bad chest congestion, head is clogged up. Coughing is really bad. Sometimes will cough until he throws up. Does have some asthma. Using albuterol. Worse at night and worse with lying down. Trouble moving air. Using albuterol right now more. AF, no myalgias.   Has asthma.   Past Medical History, Surgical History, Social History, Family History, Problem List, Medications, and Allergies have been reviewed and updated if relevant.  Review of Systems: ROS: GEN: Acute illness details above GI: Tolerating PO intake GU: maintaining adequate hydration and urination Pulm: No SOB Interactive and getting along well at home.  Otherwise, ROS is as per the HPI.   Physical Examination: Filed Vitals:   03/05/11 1233  BP: 124/78  Pulse: 88  Temp: 99.2 F (37.3 C)  TempSrc: Oral  Height: 5\' 9"  (1.753 m)  Weight: 243 lb 4 oz (110.337 kg)  SpO2: 95%    Body mass index is 35.92 kg/(m^2).   GEN: A and O x 3. WDWN. NAD.    ENT: Nose clear, ext NML.  No LAD.  No JVD.  TM's clear. Oropharynx clear.  PULM: Normal WOB, no distress. Decreased air movement with occ rhonchi. Minimal wheezing CV: RRR, no M/G/R, No rubs, No JVD.   EXT: warm and well-perfused, No c/c/e. PSYCH: Pleasant and conversant.   Assessment and Plan: 1. Asthma exacerbation  predniSONE (DELTASONE) 20 MG tablet, azithromycin (ZITHROMAX Z-PAK) 250 MG tablet  2. Bronchitis with asthma, acute  predniSONE (DELTASONE) 20 MG tablet, azithromycin (ZITHROMAX Z-PAK) 250 MG tablet    Suspect mostly from asthma flare, place on pred. zpak

## 2011-05-07 ENCOUNTER — Other Ambulatory Visit: Payer: Self-pay | Admitting: Family Medicine

## 2011-05-14 ENCOUNTER — Other Ambulatory Visit: Payer: Self-pay | Admitting: Family Medicine

## 2011-05-14 DIAGNOSIS — Z125 Encounter for screening for malignant neoplasm of prostate: Secondary | ICD-10-CM

## 2011-05-14 DIAGNOSIS — I10 Essential (primary) hypertension: Secondary | ICD-10-CM

## 2011-05-14 DIAGNOSIS — E785 Hyperlipidemia, unspecified: Secondary | ICD-10-CM

## 2011-05-15 ENCOUNTER — Other Ambulatory Visit (INDEPENDENT_AMBULATORY_CARE_PROVIDER_SITE_OTHER): Payer: 59

## 2011-05-15 DIAGNOSIS — E785 Hyperlipidemia, unspecified: Secondary | ICD-10-CM

## 2011-05-15 DIAGNOSIS — I10 Essential (primary) hypertension: Secondary | ICD-10-CM

## 2011-05-15 DIAGNOSIS — Z125 Encounter for screening for malignant neoplasm of prostate: Secondary | ICD-10-CM

## 2011-05-15 LAB — BASIC METABOLIC PANEL
Calcium: 8.9 mg/dL (ref 8.4–10.5)
Chloride: 103 mEq/L (ref 96–112)
Creatinine, Ser: 0.9 mg/dL (ref 0.4–1.5)

## 2011-05-15 LAB — LIPID PANEL
HDL: 53.7 mg/dL (ref >39.00)
Total CHOL/HDL Ratio: 3

## 2011-05-15 LAB — PSA: PSA: 0.11 ng/mL (ref 0.10–4.00)

## 2011-05-17 ENCOUNTER — Ambulatory Visit: Payer: 59 | Admitting: Family Medicine

## 2011-05-21 ENCOUNTER — Ambulatory Visit: Payer: 59 | Admitting: Family Medicine

## 2011-05-21 ENCOUNTER — Encounter: Payer: Self-pay | Admitting: Family Medicine

## 2011-05-21 ENCOUNTER — Ambulatory Visit (INDEPENDENT_AMBULATORY_CARE_PROVIDER_SITE_OTHER): Payer: 59 | Admitting: Family Medicine

## 2011-05-21 VITALS — BP 148/80 | HR 84 | Temp 98.7°F | Ht 70.0 in | Wt 249.8 lb

## 2011-05-21 DIAGNOSIS — K439 Ventral hernia without obstruction or gangrene: Secondary | ICD-10-CM

## 2011-05-21 DIAGNOSIS — Z0000 Encounter for general adult medical examination without abnormal findings: Secondary | ICD-10-CM

## 2011-05-21 DIAGNOSIS — J45909 Unspecified asthma, uncomplicated: Secondary | ICD-10-CM

## 2011-05-21 DIAGNOSIS — E785 Hyperlipidemia, unspecified: Secondary | ICD-10-CM

## 2011-05-21 DIAGNOSIS — F341 Dysthymic disorder: Secondary | ICD-10-CM

## 2011-05-21 NOTE — Progress Notes (Signed)
Subjective:    Patient ID: Erik Allen, male    DOB: [birth year], 56 y.o.   MRN: [id number]  HPI CC: CPE  Presents as transfer of care from Dr. Hetty Ely.  Elevated BP - stressful weekend. BP Readings from Last 3 Encounters:  05/21/11 148/80  03/05/11 124/78  01/26/11 126/76    On zoloft for several years (started when mother passed).  Would like to come off.  Wt Readings from Last 3 Encounters:  05/21/11 249 lb 12 oz (113.286 kg)  03/05/11 243 lb 4 oz (110.337 kg)  01/26/11 243 lb (110.224 kg)    Preventative: Colonsocopy - 2010 WNL, rpt due 10 yrs. No BM changes, blood in stool Prostate - last checked last year, normal.  Not strong stream but "good stream", nocturia x1. 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  Past histories reviewed and updated if relevant as below. Patient Active Problem List  Diagnoses  . UNSPECIFIED VITAMIN D DEFICIENCY  . HYPERLIPIDEMIA  . ANXIETY DEPRESSION  . HYPERTENSION  . ALLERGIC RHINITIS  . ASTHMA  . GERD  . INGUINAL HERNIA, LEFT  . ABDOMINAL WALL HERNIA  . HEMOCCULT POSITIVE STOOL  . BENIGN PROSTATIC HYPERTROPHY  . ROSACEA  . DYSPHONIA  . HYPERGLYCEMIA, FASTING  . SPRAIN AND STRAIN OF RADIOCARPAL OF WRIST  . Carpal tunnel syndrome of right wrist  . Sinusitis   Past Medical History  Diagnosis Date  . Hyperlipemia 10/13/97  . GERD (gastroesophageal reflux disease) 10/13/01    ENT eval for GERD and thrush 2011  . Asthma 09/13/99  . HTN (hypertension)   . Seasonal allergic rhinitis     worse in spring  . History of kidney stones    Past Surgical History  Procedure Date  . Cystoscopy 08/02    Stone retrieval  . Cystectomy     from back  . Foot capsule  release w/ percutaneous heel cord lengthening, tibial tendon transfer 04/09    bilateral, Dr. Al Corpus  . Inguinal hernia repair 06/28/09    left, with mesh, Dr. Lemar Livings  . US echocardiography 09/01    TR, MR  . A1 pulley release 10/2010    stensosing tenosynovitis, R milddle, ring, little fingers  . Colonoscopy 2010    WNL, rpt due 10 yrs   History  Substance Use Topics  . Smoking status: Never Smoker   . Smokeless tobacco: Never Used  . Alcohol Use: 0.5 - 1.0 oz/week    1-2 drink(s) per week     occassionally   Family History  Problem Relation Age of Onset  . Hypertension Mother   . Cancer Father 45    Lymphoma, recurrent  . Hypertension Father   . Hypertension Brother    Allergies  Allergen Reactions  . Codeine     REACTION: DIZZY / HEADACHE  . Tramadol     REACTION: NAUSEA/ VOMITING   Current Outpatient Prescriptions on File Prior to Visit  Medication Sig Dispense Refill  . finasteride (PROSCAR) 5 MG tablet TAKE ONE TABLET BY MOUTH EVERY DAY  90 tablet  0  . Fluticasone-Salmeterol (ADVAIR DISKUS) 100-50 MCG/DOSE AEPB Inhale 2 puffs into the lungs daily.  180 each  3  . lansoprazole (PREVACID) 30 MG capsule Take 1 capsule (30 mg total) by  mouth daily.  90 capsule  3  . lisinopril (PRINIVIL,ZESTRIL) 5 MG tablet TAKE ONE TABLET BY MOUTH EVERY DAY  30 tablet  3  . potassium chloride (KLOR-CON) 10 MEQ CR tablet Take 1 tablet (10 mEq total) by mouth daily.  90 tablet  3  . sertraline (ZOLOFT) 50 MG tablet Take one by mouth daily.  30 tablet  11  . simvastatin (ZOCOR) 80 MG tablet Take 1 tablet (80 mg total) by mouth at bedtime.  90 tablet  3  . VENTOLIN HFA 108 (90 BASE) MCG/ACT inhaler INHALE TWO PUFFS EVERY FOUR HOURS AS NEEDED  1 Inhaler  5     Review of Systems  Constitutional: Negative for fever, chills, activity change, appetite change, fatigue and unexpected weight change.  HENT: Negative for hearing loss and neck pain.   Eyes: Negative for visual disturbance.    Respiratory: Positive for cough and wheezing (in h/o asthma, worse in hot weather and pollen season). Negative for chest tightness and shortness of breath.   Cardiovascular: Negative for chest pain, palpitations and leg swelling.  Gastrointestinal: Negative for nausea, vomiting, abdominal pain, diarrhea, constipation, blood in stool and abdominal distention.  Genitourinary: Negative for hematuria and difficulty urinating.  Musculoskeletal: Negative for myalgias and arthralgias.  Skin: Negative for rash.  Neurological: Negative for dizziness, seizures, syncope and headaches.  Hematological: Does not bruise/bleed easily.  Psychiatric/Behavioral: Negative for dysphoric mood. The patient is not nervous/anxious.        Objective:   Physical Exam  Nursing note and vitals reviewed. Constitutional: He is oriented to person, place, and time. He appears well-developed and well-nourished. No distress.  HENT:  Head: Normocephalic and atraumatic.  Right Ear: External ear normal.  Left Ear: External ear normal.  Nose: Nose normal.  Mouth/Throat: Oropharynx is clear and moist. No oropharyngeal exudate.  Eyes: Conjunctivae and EOM are normal. Pupils are equal, round, and reactive to light. No scleral icterus.  Neck: Normal range of motion. Neck supple. No thyromegaly present.  Cardiovascular: Normal rate, regular rhythm, normal heart sounds and intact distal pulses.   No murmur heard. Pulses:      Radial pulses are 2+ on the right side, and 2+ on the left side.       Dorsalis pedis pulses are 2+ on the right side, and 2+ on the left side.  Pulmonary/Chest: Effort normal and breath sounds normal. No respiratory distress. He has no wheezes. He has no rales.  Abdominal: Soft. Bowel sounds are normal. He exhibits no distension and no mass. There is no tenderness. There is no rebound and no guarding. Hernia confirmed negative in the right inguinal area and confirmed negative in the left inguinal area.   Genitourinary: Rectum normal, prostate normal and penis normal. Rectal exam shows no external hemorrhoid, no internal hemorrhoid, no fissure, no mass, no tenderness and anal tone normal. Prostate is not enlarged and not tender.  Musculoskeletal: Normal range of motion. He exhibits no edema.  Lymphadenopathy:    He has no cervical adenopathy.  Neurological: He is alert and oriented to person, place, and time.       CN grossly intact, station and gait intact  Skin: Skin is warm and dry. No rash noted.  Psychiatric: He has a normal mood and affect. His behavior is normal. Judgment and thought content normal.       Assessment & Plan:

## 2011-05-21 NOTE — Assessment & Plan Note (Signed)
Chronic, stable. Continue zocor.  Tolerating well.

## 2011-05-21 NOTE — Assessment & Plan Note (Signed)
Currently stable.  Continue meds.

## 2011-05-21 NOTE — Assessment & Plan Note (Signed)
Sounds like more situational (adjustment disorder after mother's passing). Pt desires to come off med, discussed slow titration off zoloft.

## 2011-05-21 NOTE — Patient Instructions (Addendum)
To come off zoloft - take 1/2 pill for 1 month, then may stop.  If any issues, let me know. Keep eye on blood pressure, if consistently <140/90, let me know. Try to start by incorporating 30 min of exercise into routine 3 days a week or more. Good to see you today ,call us with questions.

## 2011-05-21 NOTE — Assessment & Plan Note (Signed)
Preventative protocol reviewed and updated unless pt declined. utd colonoscopy DRE/PSA reassuring today. Not due for shots. Discussed healthy diet and encouraged increased exercise in routine, thinks could restart walking

## 2011-05-28 ENCOUNTER — Other Ambulatory Visit: Payer: Self-pay | Admitting: Family Medicine

## 2011-05-29 ENCOUNTER — Other Ambulatory Visit: Payer: Self-pay | Admitting: Family Medicine

## 2011-06-04 ENCOUNTER — Ambulatory Visit (INDEPENDENT_AMBULATORY_CARE_PROVIDER_SITE_OTHER): Payer: 59 | Admitting: Family Medicine

## 2011-06-04 ENCOUNTER — Ambulatory Visit: Payer: 59 | Admitting: Family Medicine

## 2011-06-04 ENCOUNTER — Encounter: Payer: Self-pay | Admitting: Family Medicine

## 2011-06-04 VITALS — BP 128/82 | HR 84 | Temp 97.6°F | Wt 247.0 lb

## 2011-06-04 DIAGNOSIS — I10 Essential (primary) hypertension: Secondary | ICD-10-CM

## 2011-06-04 MED ORDER — HYDROCHLOROTHIAZIDE 12.5 MG PO CAPS: 12.5000 mg | ORAL_CAPSULE | Freq: Every day | ORAL | Status: DC

## 2011-06-04 NOTE — Progress Notes (Signed)
  Subjective:    Patient ID: Erik Allen, male    DOB: [birth year], 56 y.o.   MRN: [id number]  HPI CC: f/u elevated BP  H/o HTN - on lisinopril 20mg  daily.  Brings log of random BP taken during day at home and at work (by work [employer] fundraiser) showing bp 140-162/80-90s.  Did not take AM med today and bp stable. BP Readings from Last 3 Encounters:  06/04/11 128/82  05/21/11 148/80  03/05/11 124/78   No vision changes, CP/tightness, SOB, leg swelling.  Has had headaches but attributes to sinus congestion.  Lab Results  Component Value Date   CREATININE 0.9 05/15/2011   Uses respirator when mowing lawn.  Wt Readings from Last 3 Encounters:  06/04/11 247 lb (112.038 kg)  05/21/11 249 lb 12 oz (113.286 kg)  03/05/11 243 lb 4 oz (110.337 kg)    Review of Systems Per HPI    Objective:   Physical Exam  Vitals reviewed. Constitutional: He appears well-developed and well-nourished. No distress.  HENT:  Head: Normocephalic and atraumatic.  Mouth/Throat: Oropharynx is clear and moist. No oropharyngeal exudate.  Eyes: Conjunctivae and EOM are normal. Pupils are equal, round, and reactive to light. No scleral icterus.  Neck: Normal range of motion. Neck supple. Carotid bruit is not present.  Cardiovascular: Normal rate, regular rhythm and intact distal pulses.   Murmur (2/6 SEM at L sternum) heard. Pulmonary/Chest: Effort normal and breath sounds normal. No respiratory distress. He has no wheezes. He has no rales.  Musculoskeletal: He exhibits no edema.  Skin: Skin is warm and dry. No rash noted.      Assessment & Plan:

## 2011-06-04 NOTE — Patient Instructions (Addendum)
Blood pressure is up.  Start hydrochlorothiazide at 12.5mg  daily.  Call me with update on blood pressures in 3-4 weeks and return in 6 wks for f/u.

## 2011-06-04 NOTE — Assessment & Plan Note (Signed)
Chronic.  Remaining elevated as evidenced by log he brings over last week. Start HCTZ at 12.5mg  daily. Return 6 mo for f/u. Combo pill when dosage stable.

## 2011-06-05 ENCOUNTER — Ambulatory Visit: Payer: 59 | Admitting: Family Medicine

## 2011-06-10 ENCOUNTER — Other Ambulatory Visit: Payer: Self-pay | Admitting: Family Medicine

## 2011-06-25 ENCOUNTER — Other Ambulatory Visit: Payer: Self-pay | Admitting: Family Medicine

## 2011-07-16 ENCOUNTER — Ambulatory Visit (INDEPENDENT_AMBULATORY_CARE_PROVIDER_SITE_OTHER): Payer: 59 | Admitting: Family Medicine

## 2011-07-16 ENCOUNTER — Encounter: Payer: Self-pay | Admitting: Family Medicine

## 2011-07-16 VITALS — BP 130/82 | HR 88 | Temp 98.4°F | Wt 250.2 lb

## 2011-07-16 DIAGNOSIS — E66811 Obesity, class 1: Secondary | ICD-10-CM | POA: Insufficient documentation

## 2011-07-16 DIAGNOSIS — I10 Essential (primary) hypertension: Secondary | ICD-10-CM

## 2011-07-16 DIAGNOSIS — E669 Obesity, unspecified: Secondary | ICD-10-CM | POA: Insufficient documentation

## 2011-07-16 MED ORDER — LISINOPRIL 10 MG PO TABS: 10.0000 mg | ORAL_TABLET | Freq: Every day | ORAL | Status: DC

## 2011-07-16 NOTE — Progress Notes (Signed)
  Subjective:    Patient ID: Erik Allen, male    DOB: 1955/12/07, 56 y.o.   MRN: [id number]  HPI CC: HTN f/u  HTN - No HA, vision changes, CP/tightness, SOB, leg swelling.  Has been taking hctz 12.5mg  and lisinopril 5mg  daily but has not been checking bp at home.  Titrated off zoloft and doing well.  Intermittent "throat cramps" where he has difficulty swallowing.  Associated with spasm in throat.  Takes nightly lansoprazole and this controls heartburn well.  No nausea/vomiting, early satiety.  Longstanding cough for last 1 year, worse at night.  Thinks raising head of bed helps this.  Wt Readings from Last 3 Encounters:  07/16/11 250 lb 4 oz (113.513 kg)  06/04/11 247 lb (112.038 kg)  05/21/11 249 lb 12 oz (113.286 kg)   Past Medical History  Diagnosis Date  . Hyperlipemia 10/13/97  . GERD (gastroesophageal reflux disease) 10/13/01    ENT eval for GERD and thrush 2011  . Asthma 09/13/99  . HTN (hypertension)   . Seasonal allergic rhinitis     worse in spring  . History of kidney stones   . Systolic murmur      Review of Systems Per HPI    Objective:   Physical Exam  Nursing note and vitals reviewed. Constitutional: He appears well-developed and well-nourished. No distress.  HENT:  Head: Normocephalic and atraumatic.  Mouth/Throat: Oropharynx is clear and moist. No oropharyngeal exudate.  Eyes: Conjunctivae and EOM are normal. Pupils are equal, round, and reactive to light. No scleral icterus.  Neck: Normal range of motion. Neck supple. Carotid bruit is not present.  Cardiovascular: Normal rate, regular rhythm, normal heart sounds and intact distal pulses.   No murmur heard. Pulmonary/Chest: Breath sounds normal. No respiratory distress. He has no wheezes. He has no rales.  Musculoskeletal: He exhibits edema (tr pedal edema).  Skin: Skin is warm and dry. No rash noted.       Assessment & Plan:

## 2011-07-16 NOTE — Patient Instructions (Addendum)
Stop hydrochlorothiazide and start lisinopril 2 pills daily until you run out then new dose will be 1 pill of 10mg  daily. Let me know how cough is doing. Keep an eye on blood pressure a few times a week and call me if >140/90 consistently. Good to see you today, call us with quesitons. Return in 3 months for follow up

## 2011-07-16 NOTE — Assessment & Plan Note (Signed)
I thought pt was on lisinopril 20mg  but actually only on 5mg .  Advised raise to 10mg  daily and stop HCTZ for now. Keep eye on cough, if worsening, likely ACEI and will change.  Otherwise possibly GERD.

## 2011-08-07 ENCOUNTER — Ambulatory Visit (INDEPENDENT_AMBULATORY_CARE_PROVIDER_SITE_OTHER): Payer: 59 | Admitting: Family Medicine

## 2011-08-07 ENCOUNTER — Encounter: Payer: Self-pay | Admitting: Family Medicine

## 2011-08-07 VITALS — BP 135/85 | HR 89 | Temp 98.4°F | Wt 254.5 lb

## 2011-08-07 DIAGNOSIS — E669 Obesity, unspecified: Secondary | ICD-10-CM

## 2011-08-07 DIAGNOSIS — R609 Edema, unspecified: Secondary | ICD-10-CM | POA: Insufficient documentation

## 2011-08-07 DIAGNOSIS — R600 Localized edema: Secondary | ICD-10-CM | POA: Insufficient documentation

## 2011-08-07 MED ORDER — LISINOPRIL-HYDROCHLOROTHIAZIDE 10-12.5 MG PO TABS: 1.0000 | ORAL_TABLET | Freq: Every day | ORAL | Status: DC

## 2011-08-07 NOTE — Assessment & Plan Note (Signed)
May need eval for OSA in near future.

## 2011-08-07 NOTE — Patient Instructions (Signed)
Start combo pill (lisinopril 10mg  hydrochlorothiazide 12.5mg ) instead of only lisinopril 10mg . Blood work today. We will call you with results. Avoid salt, elevate legs.  If worsening, return sooner. Otherwise keep appointment for September.

## 2011-08-07 NOTE — Progress Notes (Signed)
  Subjective:    Patient ID: Erik Allen, male    DOB: August 20, 1955, 56 y.o.   MRN: [id number]  HPI CC: leg swelling  Noted predominantly left leg swelling and mild on right as well as some left arm/hand swelling.  Endorses tight feeling and some numnbess.  This noted over weekend, now better.  SOB hasn't recently worsened.  Recently lisinopril increased from 5mg  to 10mg  as well as stopped HCTZ.  Dry nagging cough has remained.  Occasional sob worse with prolonged talking.  Has awaken coughing.  No gasping for breath.  Does snore and talk in sleep.  Weight up in last 3 weeks, has decreased amt food he eats, has increased vegetables.  Activity - none. Wt Readings from Last 3 Encounters:  08/07/11 254 lb 8 oz (115.44 kg)  07/16/11 250 lb 4 oz (113.513 kg)  06/04/11 247 lb (112.038 kg)    BP Readings from Last 3 Encounters:  08/07/11 135/85  07/16/11 130/82  06/04/11 128/82   Lab Results  Component Value Date   K 4.7 05/15/2011    Review of Systems Per HPI    Objective:   Physical Exam  Nursing note and vitals reviewed. Constitutional: He appears well-developed and well-nourished. No distress.  HENT:  Head: Normocephalic and atraumatic.  Mouth/Throat: Oropharynx is clear and moist. No oropharyngeal exudate.  Neck: Normal range of motion. Neck supple. No hepatojugular reflux and no JVD present. Carotid bruit is not present.  Cardiovascular: Normal rate, regular rhythm and intact distal pulses.   Murmur (2/6 SEM, old) heard. Pulmonary/Chest: Effort normal and breath sounds normal. No respiratory distress. He has no wheezes. He has no rales.  Musculoskeletal: He exhibits edema (1+ pitting bilateral LE).  Skin: Skin is warm and dry.       Sunburn throughout       Assessment & Plan:

## 2011-08-07 NOTE — Assessment & Plan Note (Signed)
Recently stopped HCTZ, anticipate he does need component of diuresis so will prescribe combo pill (ACEI/HCTZ). Blood work to check kidneys, liver, ionotropic activity of heart. Return sooner if worsening o/w keep appt 10/2011.

## 2011-08-08 LAB — COMPREHENSIVE METABOLIC PANEL
ALT: 55 U/L — ABNORMAL HIGH (ref 0–53)
AST: 34 U/L (ref 0–37)
Albumin: 4 g/dL (ref 3.5–5.2)
Calcium: 9.3 mg/dL (ref 8.4–10.5)
Chloride: 107 mEq/L (ref 96–112)
Potassium: 4.3 mEq/L (ref 3.5–5.1)

## 2011-08-09 ENCOUNTER — Encounter: Payer: Self-pay | Admitting: *Deleted

## 2011-08-24 ENCOUNTER — Other Ambulatory Visit: Payer: Self-pay | Admitting: Family Medicine

## 2011-10-19 ENCOUNTER — Ambulatory Visit (INDEPENDENT_AMBULATORY_CARE_PROVIDER_SITE_OTHER): Payer: 59 | Admitting: Family Medicine

## 2011-10-19 ENCOUNTER — Encounter: Payer: Self-pay | Admitting: Family Medicine

## 2011-10-19 VITALS — BP 124/78 | HR 88 | Temp 98.3°F | Wt 248.8 lb

## 2011-10-19 DIAGNOSIS — I10 Essential (primary) hypertension: Secondary | ICD-10-CM

## 2011-10-19 MED ORDER — ALBUTEROL SULFATE HFA 108 (90 BASE) MCG/ACT IN AERS: 2.0000 | INHALATION_SPRAY | Freq: Four times a day (QID) | RESPIRATORY_TRACT | Status: DC | PRN

## 2011-10-19 NOTE — Assessment & Plan Note (Signed)
Chronic, stable. Continue meds. monitor cough on ACEI.  Pt desires to continue for now.

## 2011-10-19 NOTE — Progress Notes (Signed)
  Subjective:    Patient ID: Erik Allen, male    DOB: 08-Sep-1955, 56 y.o.   MRN: [id number]  HPI CC: 24mo f/u  Seen here a few months ago with worsening peripheral edema, added on HCTZ which improved sxs.  Mild cough present, coughing up some phlegm.  Occasionally dry, occasionally productive.  HTN - No HA, vision changes, CP/tightness, SOB, leg swelling. Compliant with combo pill.  Takes potassium daily.  BP Readings from Last 3 Encounters:  10/19/11 124/78  08/07/11 135/85  07/16/11 130/82    Wt Readings from Last 3 Encounters:  10/19/11 248 lb 12 oz (112.832 kg)  08/07/11 254 lb 8 oz (115.44 kg)  07/16/11 250 lb 4 oz (113.513 kg)  started walking again - 45 min/day.  Has daily routine.  Eats shedded wheat biscuit and glass of water on walk.  Worried because noticed clothes are getting tight - doesn't want to wear 2XL.Marland Kitchen  Review of Systems Per HPI    Objective:   Physical Exam  Nursing note and vitals reviewed. Constitutional: He appears well-developed and well-nourished. No distress.  HENT:  Head: Normocephalic and atraumatic.  Mouth/Throat: Oropharynx is clear and moist. No oropharyngeal exudate.  Eyes: Conjunctivae and EOM are normal. Pupils are equal, round, and reactive to light. No scleral icterus.  Neck: Normal range of motion. Neck supple. Carotid bruit is not present.  Cardiovascular: Normal rate, regular rhythm and intact distal pulses.   Murmur (2/6 SEM, old) heard. Pulmonary/Chest: Breath sounds normal. No respiratory distress. He has no wheezes. He has no rales.  Musculoskeletal: He exhibits no edema.  Skin: Skin is warm and dry. No rash noted.       Assessment & Plan:

## 2011-10-19 NOTE — Patient Instructions (Signed)
Return after 05/20/2012 for physical, fasting for blood work prior. Good to see you today. Continue lisinopril 10/hctz 12.5 mg as controlling blood pressure well. Continue walking as you have lost 6 lbs since last visit, good job!

## 2011-11-16 ENCOUNTER — Encounter: Payer: Self-pay | Admitting: Family Medicine

## 2011-11-16 ENCOUNTER — Ambulatory Visit (INDEPENDENT_AMBULATORY_CARE_PROVIDER_SITE_OTHER): Payer: 59 | Admitting: Family Medicine

## 2011-11-16 VITALS — BP 138/78 | HR 82 | Temp 98.1°F | Wt 247.8 lb

## 2011-11-16 DIAGNOSIS — J329 Chronic sinusitis, unspecified: Secondary | ICD-10-CM

## 2011-11-16 DIAGNOSIS — J209 Acute bronchitis, unspecified: Secondary | ICD-10-CM | POA: Insufficient documentation

## 2011-11-16 DIAGNOSIS — J45901 Unspecified asthma with (acute) exacerbation: Secondary | ICD-10-CM | POA: Insufficient documentation

## 2011-11-16 DIAGNOSIS — J45909 Unspecified asthma, uncomplicated: Secondary | ICD-10-CM | POA: Insufficient documentation

## 2011-11-16 MED ORDER — AMOXICILLIN-POT CLAVULANATE 875-125 MG PO TABS: 1.0000 | ORAL_TABLET | Freq: Two times a day (BID) | ORAL | Status: AC

## 2011-11-16 MED ORDER — HYDROCOD POLST-CHLORPHEN POLST 10-8 MG/5ML PO LQCR: 5.0000 mL | Freq: Every evening | ORAL | Status: DC | PRN

## 2011-11-16 MED ORDER — PREDNISONE 20 MG PO TABS: ORAL_TABLET | ORAL | Status: DC

## 2011-11-16 NOTE — Progress Notes (Signed)
  Subjective:    Patient ID: Erik Allen, male    DOB: 01-Jan-1956, 56 y.o.   MRN: [id number]  HPI CC: cough  5-6d h/o congestion and stuffiness in head, coughing spells.  Wheezing, wet sounding cough but not able to produce any mucous.  + HA described as sinus pressure.  Worse pain with bending head forward.  + ear pain. Trouble sleeping at night 2/2 cough.  Has been taking guaifenesin, but not helping.  Also tried OTC cough syrup, not helping.  No fevers/chills, ST, PNdrainage, abd pain, tooth pain  No sick contacts at home. No smokers at home. + h/o asthma.  Using rescue inhaler more frequenty.  Also on advair. Last alb inhalation was yesterday  Past Medical History  Diagnosis Date  . Hyperlipemia 10/13/97  . GERD (gastroesophageal reflux disease) 10/13/01    ENT eval for GERD and thrush 2011  . Asthma 09/13/99  . HTN (hypertension)   . Seasonal allergic rhinitis     worse in spring  . History of kidney stones   . Systolic murmur      Review of Systems Per HPI    Objective:   Physical Exam  Nursing note and vitals reviewed. Constitutional: He appears well-developed and well-nourished. No distress.  HENT:  Head: Normocephalic and atraumatic.  Right Ear: Hearing, tympanic membrane, external ear and ear canal normal.  Left Ear: Hearing, tympanic membrane, external ear and ear canal normal.  Nose: No mucosal edema or rhinorrhea. Right sinus exhibits frontal sinus tenderness. Right sinus exhibits no maxillary sinus tenderness. Left sinus exhibits frontal sinus tenderness. Left sinus exhibits no maxillary sinus tenderness.  Mouth/Throat: Uvula is midline and mucous membranes are normal. Posterior oropharyngeal erythema present. No oropharyngeal exudate, posterior oropharyngeal edema or tonsillar abscesses.  Eyes: Conjunctivae normal and EOM are normal. Pupils are equal, round, and reactive to light. No scleral icterus.  Neck: Normal range of motion. Neck supple.    Cardiovascular: Normal rate, regular rhythm, normal heart sounds and intact distal pulses.   No murmur heard. Pulmonary/Chest: Effort normal. No respiratory distress. He has wheezes (mild end exp). He has no rales.       Nl WOB. Tight air movement  Musculoskeletal: He exhibits no edema.  Lymphadenopathy:    He has no cervical adenopathy.  Skin: Skin is warm and dry. No rash noted.       Assessment & Plan:

## 2011-11-16 NOTE — Assessment & Plan Note (Signed)
Exacerbation triggered by sinusitis. Treat with augmentin, prednisone, and scheduled albuterol for next several days. To [facility] if worsening over weekend- discussed red flags to seek care. Pt agrees with plan.

## 2011-11-16 NOTE — Patient Instructions (Signed)
I think you do have sinus infection that has led to asthma flare. Take augmentin twice daily for 1 0days Take prednisone course. Use albuterol scheduled for next 3 days (every 4-6 hours) Take tussionex for cough at night time for next several days. Let us know if not improving as expected or any worsening. If worse over weekend (fever >101, worsening productive cough, or trouble breathing) - seek urgent care

## 2011-11-16 NOTE — Assessment & Plan Note (Addendum)
See pt instructions Treat with augmentin and prednisone. tussionex for night time cough.

## 2011-12-25 ENCOUNTER — Telehealth: Payer: Self-pay

## 2011-12-25 NOTE — Telephone Encounter (Signed)
Noted. Will see then.  

## 2011-12-25 NOTE — Telephone Encounter (Addendum)
Pt left v/m  having problem with swelling in both wrist;  Legs are puffy also. Pt noticed general all over body swelling. Pt is gaining 10 -15 lbs in last 3-4 months. Pt is taking lisinopril HCTZ one daily. Pt has non productive cough but no SOB. Pt scheduled appt with Dr Reece Agar 12/27/11 at 2:45 pm (pt could not come sooner due to pts schedule; advised pt if condition changes or worsens to call back.

## 2011-12-27 ENCOUNTER — Ambulatory Visit (INDEPENDENT_AMBULATORY_CARE_PROVIDER_SITE_OTHER): Payer: 59 | Admitting: Family Medicine

## 2011-12-27 ENCOUNTER — Encounter: Payer: Self-pay | Admitting: Family Medicine

## 2011-12-27 VITALS — HR 100 | Temp 98.3°F | Ht 70.0 in | Wt 251.0 lb

## 2011-12-27 DIAGNOSIS — R059 Cough, unspecified: Secondary | ICD-10-CM | POA: Insufficient documentation

## 2011-12-27 DIAGNOSIS — R05 Cough: Secondary | ICD-10-CM

## 2011-12-27 DIAGNOSIS — J45909 Unspecified asthma, uncomplicated: Secondary | ICD-10-CM

## 2011-12-27 DIAGNOSIS — R609 Edema, unspecified: Secondary | ICD-10-CM

## 2011-12-27 MED ORDER — LOSARTAN POTASSIUM-HCTZ 50-12.5 MG PO TABS: 1.0000 | ORAL_TABLET | Freq: Every day | ORAL | Status: DC

## 2011-12-27 MED ORDER — FLUTICASONE-SALMETEROL 250-50 MCG/DOSE IN AEPB: 1.0000 | INHALATION_SPRAY | Freq: Two times a day (BID) | RESPIRATORY_TRACT | Status: DC

## 2011-12-27 NOTE — Assessment & Plan Note (Signed)
Peak flow ok today, however story describes poor asthma control. Will increase advair to 250/50 bid.   Continue albuterol prn.

## 2011-12-27 NOTE — Assessment & Plan Note (Signed)
Nonpitting.  ?obesity related.  Doubt cardiac or CVI related.  Monitor on ARB/HCTZ. Lab Results  Component Value Date   CREATININE 1.0 08/07/2011  check urine for microalb.

## 2011-12-27 NOTE — Patient Instructions (Addendum)
Stop lisinopril hctz.  Start losartan hctz - we will see if this helps the cough. I think asthma is not as well controlled as we would like it to be.  Increase advair to 250/50 one puff twice daily.  We don't have any samples of flovent. Peak flow today to see how well you're breathing - actually looking ok. Return to see me in 1 month for follow up, sooner if worsening.

## 2011-12-27 NOTE — Assessment & Plan Note (Signed)
?  related to ACEI - change to ARB.  RTC 1 mo for f/u.

## 2011-12-27 NOTE — Progress Notes (Signed)
  Subjective:    Patient ID: Erik Allen, male    DOB: 01-02-1956, 56 y.o.   MRN: [id number]  HPI CC: swelling  Worsening swelling recently (for last several months).  Went to use watch he previously used to wear - much tighter than in past.  Prior started on lisinopril/HCTZ which initially did help some.     Also feeling coughing returning.  Constantly clearing throat.  Mild phlegm production with cough but mostly dry.  No fevers/chills.  Doesn't feel any wheezing present.  Feels some chest tightness and SOB worse with exertion.  Taking advair bid.  Using albuterol 0-2 times daily.  + nocturnal awakenings.  Activity - continues walking - 3 walks/week.  Avoiding salt/sodium, drinking plenty of water. Good fruits/vegetables.  No HA, vision changes, CP, SOB, abd pain.  Chronic sinus congestion.  Doesn't use nasal saline.  Last month seen here with sinusitis and asthma flare, treated with augmentin and prednisone course which fully resolved sxs.  No prolonged immobility.    Asthma - on advair since diagnosis - 2001.  Diagnosed as adult.  No recent peak flow.  Wt Readings from Last 3 Encounters:  12/27/11 251 lb (113.853 kg)  11/16/11 247 lb 12 oz (112.379 kg)  10/19/11 248 lb 12 oz (112.832 kg)    BP Readings from Last 3 Encounters:  12/27/11 128/76  11/16/11 138/78  10/19/11 124/78   Past Medical History  Diagnosis Date  . Hyperlipemia 10/13/97  . GERD (gastroesophageal reflux disease) 10/13/01    ENT eval for GERD and thrush 2011  . Asthma 09/13/99  . HTN (hypertension)   . Seasonal allergic rhinitis     worse in spring  . History of kidney stones   . Systolic murmur     Review of Systems Per HPI    Objective:   Physical Exam  Nursing note and vitals reviewed. Constitutional: He appears well-developed and well-nourished. No distress.  Cardiovascular: Normal rate, regular rhythm and intact distal pulses.   Murmur (2/6 SEM) heard. Pulmonary/Chest: Effort normal  and breath sounds normal. No respiratory distress. He has no wheezes (not even with forced expiration). He has no rales.  Musculoskeletal: He exhibits edema (mild nonpitting edema BUE and BLE).  Skin: Skin is warm and dry. No rash noted.       Assessment & Plan:  PF = 470, expected 556.  84% expected.

## 2011-12-28 LAB — MICROALBUMIN / CREATININE URINE RATIO
Creatinine,U: 266.8 mg/dL
Microalb Creat Ratio: 0.5 mg/g (ref 0.0–30.0)
Microalb, Ur: 1.3 mg/dL (ref 0.0–1.9)

## 2012-02-04 ENCOUNTER — Encounter: Payer: Self-pay | Admitting: Family Medicine

## 2012-02-04 ENCOUNTER — Ambulatory Visit (INDEPENDENT_AMBULATORY_CARE_PROVIDER_SITE_OTHER): Payer: 59 | Admitting: Family Medicine

## 2012-02-04 VITALS — BP 128/82 | HR 88 | Temp 98.2°F | Wt 252.0 lb

## 2012-02-04 DIAGNOSIS — J45909 Unspecified asthma, uncomplicated: Secondary | ICD-10-CM

## 2012-02-04 DIAGNOSIS — R05 Cough: Secondary | ICD-10-CM

## 2012-02-04 DIAGNOSIS — R059 Cough, unspecified: Secondary | ICD-10-CM

## 2012-02-04 DIAGNOSIS — R609 Edema, unspecified: Secondary | ICD-10-CM

## 2012-02-04 DIAGNOSIS — I10 Essential (primary) hypertension: Secondary | ICD-10-CM

## 2012-02-04 MED ORDER — ALBUTEROL SULFATE HFA 108 (90 BASE) MCG/ACT IN AERS: 2.0000 | INHALATION_SPRAY | Freq: Four times a day (QID) | RESPIRATORY_TRACT | Status: DC | PRN

## 2012-02-04 MED ORDER — POTASSIUM CHLORIDE ER 10 MEQ PO TBCR: 10.0000 meq | EXTENDED_RELEASE_TABLET | Freq: Every day | ORAL | Status: DC

## 2012-02-04 NOTE — Assessment & Plan Note (Signed)
Resolved with ACEI --> ARB bp stable.

## 2012-02-04 NOTE — Assessment & Plan Note (Signed)
Marked subjective improvement with increased advair dose - continue.

## 2012-02-04 NOTE — Assessment & Plan Note (Signed)
Pitting today but bilaterally symmetrical. Not impressive for DVT.  Recent kidney eval normal with Cr 1.0 and normal microalb.  Lab Results  Component Value Date   ALT 55* 08/07/2011   AST 34 08/07/2011   ALKPHOS 51 08/07/2011   BILITOT 0.4 08/07/2011  very tight hamstrings today - recommended starting hamstring stretching exercises - provided today. If no improvement, consider recheck LFTs and BNP vs compression stockings and check echo in h/o systolic murmur to eval EF. Heart exam unchanged, lungs clear.

## 2012-02-04 NOTE — Patient Instructions (Signed)
I'm glad breathing and cough are better.  Continue meds for now. You have very tight hamstrings today - I wonder if this is contributing to the leg pain. Treat with stretching exercises provided today. Return if not improving, otherwise return in 3-4 mo for f/u. Good to see you today, merry Christmas.

## 2012-02-04 NOTE — Assessment & Plan Note (Signed)
Chronic, stable on new med (ARB/HCTZ).

## 2012-02-04 NOTE — Progress Notes (Signed)
  Subjective:    Patient ID: Konrad Felix, male    DOB: 22-Mar-1955, 56 y.o.   MRN: [id number]  HPI CC: 1 mo f/u  Last visit with presumed poor asthma control and with cough.  ACEI stopped, advair increased to 250/50mg  bid.  With both these changes, noted significant improvement in cough and subjective improvement with asthma control.  Continued leg swelling R>L for last several months.  Also continued arm swelling.  Addition of HCTZ didn't really help.  R calf feels tight, has burning pain to touch.  No mouth or lip swelling.  Occasional LBP with occasional shooting pain down R leg.  Already tries to keep elevated.  Already avoids salt foods, drinking more water.  No prolonged immobility. No personal or family history of blood clots.  Non smker.  No hormonal meds.  H/o chronic systolic heart murmur.  Wt Readings from Last 3 Encounters:  02/04/12 252 lb (114.306 kg)  12/27/11 251 lb (113.853 kg)  11/16/11 247 lb 12 oz (112.379 kg)   BP Readings from Last 3 Encounters:  02/04/12 128/82  11/16/11 138/78  10/19/11 124/78    Past Medical History  Diagnosis Date  . Hyperlipemia 10/13/97  . GERD (gastroesophageal reflux disease) 10/13/01    ENT eval for GERD and thrush 2011  . Asthma 09/13/99  . HTN (hypertension)   . Seasonal allergic rhinitis     worse in spring  . History of kidney stones   . Systolic murmur    Past Surgical History  Procedure Date  . Cystoscopy 08/02    Stone retrieval  . Cystectomy     from back  . Foot capsule release w/ percutaneous heel cord lengthening, tibial tendon transfer 04/09    bilateral, Dr. Al Corpus  . Inguinal hernia repair 06/28/09    left, with mesh, Dr. Lemar Livings  . US echocardiography 09/01    TR, MR  . A1 pulley release 10/2010    stensosing tenosynovitis, R milddle, ring, little fingers  . Colonoscopy 2010    WNL, rpt due 10 yrs    Review of Systems Per HPI    Objective:   Physical Exam  Nursing note and vitals  reviewed. Constitutional: He appears well-developed and well-nourished. No distress.  Cardiovascular: Normal rate, regular rhythm and intact distal pulses.   Murmur (3/6 SEM ) heard. Pulmonary/Chest: Effort normal and breath sounds normal. No respiratory distress. He has no wheezes. He has no rales.  Musculoskeletal: He exhibits edema (1+ pedal edema bilaterally).       Calf circ: R 42cm, L 42.5cm Limited SLR 2/2 marked hamstring tightness bilaterally  Neurological:       5/5 strength BLE  Skin: Skin is warm and dry. No rash noted.  Psychiatric: He has a normal mood and affect.       Assessment & Plan:

## 2012-02-08 ENCOUNTER — Other Ambulatory Visit: Payer: Self-pay | Admitting: Family Medicine

## 2012-02-13 DIAGNOSIS — R7401 Elevation of levels of liver transaminase levels: Secondary | ICD-10-CM

## 2012-02-13 HISTORY — DX: Elevation of levels of liver transaminase levels: R74.01

## 2012-03-04 ENCOUNTER — Other Ambulatory Visit: Payer: Self-pay | Admitting: Family Medicine

## 2012-03-19 ENCOUNTER — Encounter: Payer: Self-pay | Admitting: Family Medicine

## 2012-03-19 ENCOUNTER — Ambulatory Visit (INDEPENDENT_AMBULATORY_CARE_PROVIDER_SITE_OTHER): Payer: 59 | Admitting: Family Medicine

## 2012-03-19 VITALS — BP 130/80 | HR 84 | Temp 98.1°F | Wt 251.5 lb

## 2012-03-19 DIAGNOSIS — J40 Bronchitis, not specified as acute or chronic: Secondary | ICD-10-CM

## 2012-03-19 DIAGNOSIS — J45901 Unspecified asthma with (acute) exacerbation: Secondary | ICD-10-CM

## 2012-03-19 MED ORDER — HYDROCOD POLST-CHLORPHEN POLST 10-8 MG/5ML PO LQCR
5.0000 mL | Freq: Every evening | ORAL | Status: DC | PRN
Start: 2012-03-19 — End: 2012-05-19

## 2012-03-19 MED ORDER — PREDNISONE 20 MG PO TABS: ORAL_TABLET | ORAL | Status: DC

## 2012-03-19 MED ORDER — AZITHROMYCIN 250 MG PO TABS: ORAL_TABLET | ORAL | Status: DC

## 2012-03-19 NOTE — Assessment & Plan Note (Signed)
Exacerbation triggered by bronchitis. Treat with zpack, steroids and albuterol scheduled. Update [place] if not improving as expected or any worsening sxs. Consider singulair in spring/fall.

## 2012-03-19 NOTE — Progress Notes (Signed)
  Subjective:    Patient ID: Erik Allen, male    DOB: 1955/05/07, 57 y.o.   MRN: [id number]  HPI CC: chest cold  Pleasant 57 yo with h/o asthma presents with 7d h/o chest congestion, progressively worsening.  Started with ST, congestion.  Worsened dyspnea, cough, fatigue.  Trouble sleeping 2/2 cough.  Woke up at 2am with sweat.  + chills yesterday and last night.  abd soreness from coughing.  + HA, wheezing.  Chest > head congestion.  Using mucinex.  No PNdrainage.  + sick contacts at work  No smokers at home. Asthma - compliant with advair 250/50mg  bid.  Using albuterol as well but causing headache.  Triggers are URIs and cigarette smoke.  Usually worsens in spring. When feeling well, seldom uses albuterol. Did receive flu shot.  Past Medical History  Diagnosis Date  . Hyperlipemia 10/13/97  . GERD (gastroesophageal reflux disease) 10/13/01    ENT eval for GERD and thrush 2011  . Asthma 09/13/99  . HTN (hypertension)   . Seasonal allergic rhinitis     worse in spring  . History of kidney stones   . Systolic murmur      Review of Systems Per HPI    Objective:   Physical Exam  Nursing note and vitals reviewed. Constitutional: He appears well-developed and well-nourished. No distress.  HENT:  Head: Normocephalic and atraumatic.  Right Ear: Hearing, tympanic membrane, external ear and ear canal normal.  Left Ear: Hearing, tympanic membrane, external ear and ear canal normal.  Nose: Nose normal. No mucosal edema or rhinorrhea. Right sinus exhibits no maxillary sinus tenderness and no frontal sinus tenderness. Left sinus exhibits no maxillary sinus tenderness and no frontal sinus tenderness.  Mouth/Throat: Uvula is midline, oropharynx is clear and moist and mucous membranes are normal. No oropharyngeal exudate, posterior oropharyngeal edema, posterior oropharyngeal erythema or tonsillar abscesses.  Eyes: Conjunctivae normal and EOM are normal. Pupils are equal, round, and  reactive to light. No scleral icterus.  Neck: Normal range of motion. Neck supple.  Cardiovascular: Normal rate, regular rhythm, normal heart sounds and intact distal pulses.   No murmur heard. Pulmonary/Chest: Effort normal. No respiratory distress. He has decreased breath sounds. He has wheezes (diffuse expiratory). He has no rhonchi. He has no rales.       Significant wheeze and cough brought on by deep breath  Lymphadenopathy:    He has no cervical adenopathy.  Skin: Skin is warm and dry. No rash noted.       Assessment & Plan:

## 2012-03-19 NOTE — Patient Instructions (Addendum)
I do think you have asthma flare from bronchitis - treat with zpack and steroid course, continue albuterol as needed Let us know if not improving as expected or worsening fever, productive cough. For night time cough - use tussionex.

## 2012-03-19 NOTE — Assessment & Plan Note (Signed)
Given concurrent asthma flare, treat with zpack and prednisone. tussionex for cough at night. See below.

## 2012-04-14 ENCOUNTER — Other Ambulatory Visit: Payer: Self-pay | Admitting: Family Medicine

## 2012-04-21 ENCOUNTER — Other Ambulatory Visit: Payer: Self-pay | Admitting: Family Medicine

## 2012-05-11 ENCOUNTER — Other Ambulatory Visit: Payer: Self-pay | Admitting: Family Medicine

## 2012-05-11 DIAGNOSIS — I10 Essential (primary) hypertension: Secondary | ICD-10-CM

## 2012-05-11 DIAGNOSIS — N400 Enlarged prostate without lower urinary tract symptoms: Secondary | ICD-10-CM

## 2012-05-11 DIAGNOSIS — E785 Hyperlipidemia, unspecified: Secondary | ICD-10-CM

## 2012-05-13 ENCOUNTER — Other Ambulatory Visit (INDEPENDENT_AMBULATORY_CARE_PROVIDER_SITE_OTHER): Payer: 59

## 2012-05-13 DIAGNOSIS — N400 Enlarged prostate without lower urinary tract symptoms: Secondary | ICD-10-CM

## 2012-05-13 DIAGNOSIS — I10 Essential (primary) hypertension: Secondary | ICD-10-CM

## 2012-05-13 DIAGNOSIS — E785 Hyperlipidemia, unspecified: Secondary | ICD-10-CM

## 2012-05-13 LAB — LIPID PANEL
Cholesterol: 141 mg/dL (ref 0–200)
HDL: 49.8 mg/dL (ref >39.00)
VLDL: 19.2 mg/dL (ref 0.0–40.0)

## 2012-05-13 LAB — COMPREHENSIVE METABOLIC PANEL
Alkaline Phosphatase: 55 U/L (ref 39–117)
BUN: 19 mg/dL (ref 6–23)
Glucose, Bld: 100 mg/dL — ABNORMAL HIGH (ref 70–99)
Sodium: 139 mEq/L (ref 135–145)
Total Bilirubin: 0.5 mg/dL (ref 0.3–1.2)

## 2012-05-13 LAB — PSA: PSA: 0.11 ng/mL (ref 0.10–4.00)

## 2012-05-19 ENCOUNTER — Other Ambulatory Visit: Payer: Self-pay | Admitting: Family Medicine

## 2012-05-19 ENCOUNTER — Encounter: Payer: Self-pay | Admitting: Family Medicine

## 2012-05-19 ENCOUNTER — Ambulatory Visit (INDEPENDENT_AMBULATORY_CARE_PROVIDER_SITE_OTHER): Payer: 59 | Admitting: Family Medicine

## 2012-05-19 VITALS — BP 142/86 | HR 84 | Temp 98.4°F | Ht 69.5 in | Wt 254.0 lb

## 2012-05-19 DIAGNOSIS — R7401 Elevation of levels of liver transaminase levels: Secondary | ICD-10-CM

## 2012-05-19 DIAGNOSIS — E785 Hyperlipidemia, unspecified: Secondary | ICD-10-CM

## 2012-05-19 DIAGNOSIS — N400 Enlarged prostate without lower urinary tract symptoms: Secondary | ICD-10-CM

## 2012-05-19 DIAGNOSIS — Z0000 Encounter for general adult medical examination without abnormal findings: Secondary | ICD-10-CM

## 2012-05-19 DIAGNOSIS — J309 Allergic rhinitis, unspecified: Secondary | ICD-10-CM

## 2012-05-19 DIAGNOSIS — J45909 Unspecified asthma, uncomplicated: Secondary | ICD-10-CM

## 2012-05-19 DIAGNOSIS — I10 Essential (primary) hypertension: Secondary | ICD-10-CM

## 2012-05-19 DIAGNOSIS — K760 Fatty (change of) liver, not elsewhere classified: Secondary | ICD-10-CM | POA: Insufficient documentation

## 2012-05-19 DIAGNOSIS — E669 Obesity, unspecified: Secondary | ICD-10-CM

## 2012-05-19 MED ORDER — MONTELUKAST SODIUM 10 MG PO TABS: 10.0000 mg | ORAL_TABLET | Freq: Every day | ORAL | Status: DC

## 2012-05-19 MED ORDER — FINASTERIDE 5 MG PO TABS: ORAL_TABLET | ORAL | Status: DC

## 2012-05-19 NOTE — Assessment & Plan Note (Signed)
Preventative protocols reviewed and updated unless pt declined. Discussed healthy diet and lifestyle. Encouraged start regular walking routine.

## 2012-05-19 NOTE — Assessment & Plan Note (Signed)
Chronic, stable. Continue zocor. 

## 2012-05-19 NOTE — Progress Notes (Signed)
Subjective:    Patient ID: Erik Allen, male    DOB: January 12, 1956, 57 y.o.   MRN: [id number]  HPI CC: CPE  Wt Readings from Last 3 Encounters:  05/19/12 254 lb (115.214 kg)  03/19/12 251 lb 8 oz (114.08 kg)  02/04/12 252 lb (114.306 kg)  Body mass index is 36.98 kg/(m^2).  Asthma - compliant with advair and albuterol. Has never tried singulair. Has been on flonase  nasonex which only intermittently help. Uses albuterol intermittently every few days. No nocturnal awakenings. Triggers are pollen. HTN - compliant with med.  HLD - compliant with simvastatin 20mg  daily.  Obesity - trying to eat healthier - less bread, fatty meats, sugar intake. Portion sizes steady. Staying too fatigued for exercise.  BPH - compliant with proscar daily. Has been on this med for years.  Prior on flomax. Mildly elevated ALT to 61. Occasional EtOH, no tylenol. Occasional advil. Anticipate 2/2 fatty liver.   Preventative:  Colonsocopy - 2010 WNL, rpt due 10 yrs. No BM changes, blood in stool  Prostate - last checked last year, normal. Not strong stream but "good stream", nocturia x1.  Td 2006 Flu shot - did receive at work this past year.  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  Past histories reviewed and updated if relevant as below. Patient Active Problem List  Diagnosis  . UNSPECIFIED VITAMIN D DEFICIENCY  . HYPERLIPIDEMIA  . ANXIETY DEPRESSION  . HYPERTENSION  . ALLERGIC RHINITIS  . ASTHMA  . GERD  . INGUINAL HERNIA, LEFT  . Ventral hernia  . BENIGN PROSTATIC HYPERTROPHY  . ROSACEA  . HYPERGLYCEMIA, FASTING  . SPRAIN AND STRAIN OF RADIOCARPAL OF WRIST  . Carpal tunnel syndrome of right wrist  . Healthcare maintenance  . Obesity   . Peripheral edema  . Bronchitis  . Asthma exacerbation  . Edema   Past Medical History  Diagnosis Date  . Hyperlipemia 10/13/97  . GERD (gastroesophageal reflux disease) 10/13/01    ENT eval for GERD and thrush 2011  . Asthma 09/13/99  . HTN (hypertension)   . Seasonal allergic rhinitis     worse in spring  . History of kidney stones   . Systolic murmur    Past Surgical History  Procedure Laterality Date  . Cystoscopy  08/02    Stone retrieval  . Cystectomy      from back  . Foot capsule release w/ percutaneous heel cord lengthening, tibial tendon transfer  04/09    bilateral, Dr. Al Corpus  . Inguinal hernia repair  06/28/09    left, with mesh, Dr. Lemar Livings  . US echocardiography  09/01    TR, MR  . A1 pulley release  10/2010    stensosing tenosynovitis, R milddle, ring, little fingers  . Colonoscopy  2010    WNL, rpt due 10 yrs   History  Substance Use Topics  . Smoking status: Never Smoker   . Smokeless tobacco: Never Used  . Alcohol Use: .5 - 1 oz/week    1-2 drink(s) per week     Comment: occassionally   Family History  Problem Relation Age of Onset  . Hypertension Mother   . Cancer Father 73    Lymphoma, recurrent  . Hypertension Father   . Hypertension Brother  Allergies  Allergen Reactions  . Ace Inhibitors Cough  . Codeine     REACTION: DIZZY / HEADACHE  . Tramadol     REACTION: NAUSEA/ VOMITING   Current Outpatient Prescriptions on File Prior to Visit  Medication Sig Dispense Refill  . albuterol (PROAIR HFA) 108 (90 BASE) MCG/ACT inhaler Inhale 2 puffs into the lungs every 6 (six) hours as needed for wheezing.  1 Inhaler  11  . finasteride (PROSCAR) 5 MG tablet TAKE ONE TABLET BY MOUTH EVERY DAY  90 tablet  3  . Fluticasone-Salmeterol (ADVAIR DISKUS) 250-50 MCG/DOSE AEPB Inhale 1 puff into the lungs 2 (two) times daily.  1 each  6  . lansoprazole (PREVACID) 30 MG capsule TAKE 1 CAPSULE BY MOUTH 45 MINUTES PRIOR TO BREAKFAST  90 capsule  2  .  losartan-hydrochlorothiazide (HYZAAR) 50-12.5 MG per tablet TAKE 1 TABLET BY MOUTH EVERY DAY  30 tablet  11  . potassium chloride (K-DUR) 10 MEQ tablet Take 1 tablet (10 mEq total) by mouth daily.  90 tablet  3  . simvastatin (ZOCOR) 80 MG tablet TAKE  ONE TABLET BY MOUTH NIGHTLY AT BEDTIME  90 tablet  0   No current facility-administered medications on file prior to visit.     Review of Systems  Constitutional: Negative for fever, chills, activity change, appetite change, fatigue and unexpected weight change.  HENT: Negative for hearing loss and neck pain.   Eyes: Negative for visual disturbance.  Respiratory: Positive for cough, shortness of breath and wheezing. Negative for chest tightness.        Asthma worse with pollen  Cardiovascular: Negative for chest pain, palpitations and leg swelling.  Gastrointestinal: Negative for nausea, vomiting, abdominal pain, diarrhea, constipation, blood in stool and abdominal distention.  Genitourinary: Negative for hematuria and difficulty urinating.  Musculoskeletal: Negative for myalgias and arthralgias.  Skin: Negative for rash.  Neurological: Negative for dizziness, seizures, syncope and headaches.  Hematological: Does not bruise/bleed easily.  Psychiatric/Behavioral: Negative for dysphoric mood. The patient is not nervous/anxious.        Objective:   Physical Exam  Nursing note and vitals reviewed. Constitutional: He is oriented to person, place, and time. He appears well-developed and well-nourished. No distress.  HENT:  Head: Normocephalic and atraumatic.  Right Ear: Hearing, tympanic membrane, external ear and ear canal normal.  Left Ear: Hearing, tympanic membrane, external ear and ear canal normal.  Nose: Nose normal.  Mouth/Throat: Oropharynx is clear and moist. No oropharyngeal exudate.  Eyes: Conjunctivae and EOM are normal. Pupils are equal, round, and reactive to light. No scleral icterus.  Neck: Normal range of motion. Neck  supple. Carotid bruit is not present. No thyromegaly present.  Cardiovascular: Normal rate, regular rhythm, normal heart sounds and intact distal pulses.   No murmur heard. Pulses:      Radial pulses are 2+ on the right side, and 2+ on the left side.  Pulmonary/Chest: Effort normal and breath sounds normal. No respiratory distress. He has no wheezes. He has no rales.  Abdominal: Soft. Bowel sounds are normal. He exhibits no distension and no mass. There is no tenderness. There is no rebound and no guarding.  Genitourinary: Rectum normal and prostate normal. Rectal exam shows no external hemorrhoid, no internal hemorrhoid, no fissure, no mass, no tenderness and anal tone normal. Prostate is not enlarged (10-15gm) and not tender.  Musculoskeletal: Normal range of motion.  Lymphadenopathy:    He has no cervical adenopathy.  Neurological: He is alert and  oriented to person, place, and time.  CN grossly intact, station and gait intact  Skin: Skin is warm and dry. No rash noted.  Psychiatric: He has a normal mood and affect. His behavior is normal. Judgment and thought content normal.       Assessment & Plan:

## 2012-05-19 NOTE — Telephone Encounter (Signed)
[facility] left v/m requesting refill finasteride # 90 x 1.

## 2012-05-19 NOTE — Assessment & Plan Note (Signed)
Chronic, stable. Continue hyzaar. 

## 2012-05-19 NOTE — Assessment & Plan Note (Signed)
DRE/PSA stable today.  Continue proscar.

## 2012-05-19 NOTE — Patient Instructions (Signed)
Let's do trial of singulair for 1 month - sent into pharmacy. Keep an eye on blood pressures at home and notify me if trending upwards. Start walking - start at 15 min 3 times a week.  Goal is 45 minutes 4-5 times a week.  I think this will help with weight. Good to see you today, call us with questions. Return as needed or in 1 year for physical.

## 2012-05-19 NOTE — Assessment & Plan Note (Signed)
Mild, elevated last year as well. Anticipate due to fatty liver.   If persistent, consider checking blood work.

## 2012-05-19 NOTE — Assessment & Plan Note (Signed)
With allergic rhinitis. Has not responded optimally to INS. Trial of singulair today.

## 2012-05-19 NOTE — Assessment & Plan Note (Signed)
Discussed diet and exercise. Provided with portion size handout.

## 2012-06-16 ENCOUNTER — Ambulatory Visit (INDEPENDENT_AMBULATORY_CARE_PROVIDER_SITE_OTHER): Payer: 59 | Admitting: Family Medicine

## 2012-06-16 ENCOUNTER — Encounter: Payer: Self-pay | Admitting: Family Medicine

## 2012-06-16 VITALS — BP 128/86 | HR 92 | Temp 98.2°F | Wt 248.0 lb

## 2012-06-16 DIAGNOSIS — J45909 Unspecified asthma, uncomplicated: Secondary | ICD-10-CM

## 2012-06-16 DIAGNOSIS — I10 Essential (primary) hypertension: Secondary | ICD-10-CM

## 2012-06-16 NOTE — Progress Notes (Signed)
  Subjective:    Patient ID: Erik Allen, male    DOB: 1955-11-24, 57 y.o.   MRN: [id number]  HPI CC: f/u HTN  Brings log of bp - 120-150/80-90, HR 90-100s  HTN - compliant with hyzaar.  No HA, vision changes, CP/tightness, nausea, sweating ,leg swelling. Some dyspnea, chronica  Thinks singulair has significantly helped with allergies and asthma.  Due for vision check - will schedule.  Walking - 15 min 4-5 days a week Down 6 lbs in last month!  Walking more, less late night snacking.  Trying to back down on salt and sugar. Wt Readings from Last 3 Encounters:  06/16/12 248 lb (112.492 kg)  05/19/12 254 lb (115.214 kg)  03/19/12 251 lb 8 oz (114.08 kg)    Past Medical History  Diagnosis Date  . Hyperlipemia 10/13/97  . GERD (gastroesophageal reflux disease) 10/13/01    ENT eval for GERD and thrush 2011  . Asthma 09/13/99  . HTN (hypertension)   . Seasonal allergic rhinitis     worse in spring  . History of kidney stones   . Systolic murmur   . BENIGN PROSTATIC HYPERTROPHY 05/14/2003    Qualifier: Diagnosis of  By: Hetty Ely MD, Franne Grip     Past Surgical History  Procedure Laterality Date  . Cystoscopy  08/02    Stone retrieval  . Cystectomy      from back  . Foot capsule release w/ percutaneous heel cord lengthening, tibial tendon transfer  04/09    bilateral, Dr. Al Corpus  . Inguinal hernia repair  06/28/09    left, with mesh, Dr. Lemar Livings  . US echocardiography  09/01    TR, MR  . A1 pulley release  10/2010    stensosing tenosynovitis, R milddle, ring, little fingers  . Colonoscopy  2010    WNL, rpt due 10 yrs    Review of Systems Per HPI    Objective:   Physical Exam  Nursing note and vitals reviewed. Constitutional: He appears well-developed and well-nourished. No distress.  obese  HENT:  Head: Normocephalic and atraumatic.  Mouth/Throat: Oropharynx is clear and moist. No oropharyngeal exudate.  Eyes: Conjunctivae and EOM are normal. Pupils are  equal, round, and reactive to light. No scleral icterus.  Cardiovascular: Normal rate, regular rhythm, normal heart sounds and intact distal pulses.   No murmur heard. Pulmonary/Chest: Effort normal and breath sounds normal. No respiratory distress. He has no wheezes. He has no rales.  Lungs clear today  Musculoskeletal: He exhibits edema (tr pedal).       Assessment & Plan:

## 2012-06-16 NOTE — Patient Instructions (Signed)
Blood pressure is looking better.  No changes today. Return in 4 months for follow up. Good job with weight and healthy diet changes - continue walking!

## 2012-06-16 NOTE — Assessment & Plan Note (Signed)
Improved with singulair - continue.

## 2012-06-16 NOTE — Assessment & Plan Note (Signed)
Chronic, improved as compared to last visit. Continue meds. Tachycardia present - will continue to monitor (?due to deconditioning).

## 2012-06-19 ENCOUNTER — Other Ambulatory Visit: Payer: Self-pay | Admitting: Family Medicine

## 2012-07-30 ENCOUNTER — Other Ambulatory Visit: Payer: Self-pay | Admitting: Family Medicine

## 2012-08-19 ENCOUNTER — Other Ambulatory Visit: Payer: Self-pay | Admitting: Family Medicine

## 2012-09-02 ENCOUNTER — Other Ambulatory Visit: Payer: Self-pay | Admitting: Family Medicine

## 2012-09-06 ENCOUNTER — Other Ambulatory Visit: Payer: Self-pay | Admitting: Family Medicine

## 2012-10-17 ENCOUNTER — Encounter: Payer: Self-pay | Admitting: Family Medicine

## 2012-10-17 ENCOUNTER — Ambulatory Visit (INDEPENDENT_AMBULATORY_CARE_PROVIDER_SITE_OTHER): Payer: 59 | Admitting: Family Medicine

## 2012-10-17 VITALS — BP 136/84 | HR 96 | Temp 98.9°F | Wt 248.2 lb

## 2012-10-17 DIAGNOSIS — I10 Essential (primary) hypertension: Secondary | ICD-10-CM

## 2012-10-17 DIAGNOSIS — R490 Dysphonia: Secondary | ICD-10-CM

## 2012-10-17 MED ORDER — FLUCONAZOLE 100 MG PO TABS: 100.0000 mg | ORAL_TABLET | Freq: Every day | ORAL | Status: DC

## 2012-10-17 MED ORDER — LOSARTAN POTASSIUM-HCTZ 100-12.5 MG PO TABS: 1.0000 | ORAL_TABLET | Freq: Every day | ORAL | Status: DC

## 2012-10-17 NOTE — Assessment & Plan Note (Signed)
Deteriorated control as evidenced by recall bp's.  Titrate hyzaar up to 100/12.5mg  daily. Did not start beta blocker 2/2 h/o significant asthma (would consider bisoprolol in future however) Rtc 3 mo for f/u rtc 10 d for lab visit for Cr.

## 2012-10-17 NOTE — Progress Notes (Signed)
  Subjective:    Patient ID: Erik Allen, male    DOB: 1955/05/14, 57 y.o.   MRN: [id number]  HPI CC: 4 mo f/u HTN  HTN - compliant with hyzaar daily.  No HA, vision changes, CP/tightness, leg swelling.  bp fluctuates at home - 130-160/90.  Recently running higher than in the past.  Hoarseness has returned - wonders about thrush on vocal cords (h/o this in the past after eval by ENT 2 yrs ago).  Attributed to advair.  Treated with oral antifungal and sxs improved in the past.  Asthma - Persistent dyspnea especially at night. compliant with asthma medications (advair and singulair)  Uses albuterol prn.  Some night sweats over the last month.  Wt Readings from Last 3 Encounters:  10/17/12 248 lb 4 oz (112.605 kg)  06/16/12 248 lb (112.492 kg)  05/19/12 254 lb (115.214 kg)   Past Medical History  Diagnosis Date  . Hyperlipemia 10/13/97  . GERD (gastroesophageal reflux disease) 10/13/01    ENT eval for GERD and thrush 2011  . Asthma 09/13/99  . HTN (hypertension)   . Seasonal allergic rhinitis     worse in spring  . History of kidney stones   . Systolic murmur   . BENIGN PROSTATIC HYPERTROPHY 05/14/2003    Qualifier: Diagnosis of  By: Hetty Ely MD, Franne Grip      Review of Systems Per HPI    Objective:   Physical Exam  Nursing note and vitals reviewed. Constitutional: He appears well-developed and well-nourished. No distress.  HENT:  Mouth/Throat: Oropharynx is clear and moist. No oropharyngeal exudate.  Cardiovascular: Normal rate, regular rhythm, normal heart sounds and intact distal pulses.   No murmur heard. Pulmonary/Chest: Effort normal and breath sounds normal. No respiratory distress. He has no wheezes. He has no rales.  Musculoskeletal: He exhibits no edema.  Skin: Skin is warm and dry. No rash noted.       Assessment & Plan:

## 2012-10-17 NOTE — Patient Instructions (Signed)
Let's increase hyzaar to 100/12.5mg  daily for better blood pressure control. Return 10 days after increasing dose to recheck kidney function. For hoarseness - treat with diflucan course.  Hold simvastatin while you take diflucan. Return to see me in 3-4 months for follow up.

## 2012-10-17 NOTE — Assessment & Plan Note (Signed)
Per pt, ENT eval 2 yrs ago revealing vocal cord thrush - treated with oral antifungal and resolved.   Now with similar sxs. Will treat with 1 wk course of diflucan 100mg  daily, if not resolved, low threshold to refer back to ENT for re evaluation. Pt agrees with plan.

## 2012-10-27 ENCOUNTER — Other Ambulatory Visit (INDEPENDENT_AMBULATORY_CARE_PROVIDER_SITE_OTHER): Payer: 59

## 2012-10-27 ENCOUNTER — Encounter: Payer: Self-pay | Admitting: *Deleted

## 2012-10-27 DIAGNOSIS — I10 Essential (primary) hypertension: Secondary | ICD-10-CM

## 2012-10-27 LAB — CREATININE, SERUM: Creatinine, Ser: 0.9 mg/dL (ref 0.4–1.5)

## 2012-11-13 ENCOUNTER — Other Ambulatory Visit: Payer: Self-pay | Admitting: Family Medicine

## 2012-12-02 ENCOUNTER — Ambulatory Visit (INDEPENDENT_AMBULATORY_CARE_PROVIDER_SITE_OTHER): Payer: 59 | Admitting: Family Medicine

## 2012-12-02 ENCOUNTER — Encounter: Payer: Self-pay | Admitting: Family Medicine

## 2012-12-02 VITALS — BP 136/88 | HR 84 | Temp 98.5°F | Wt 247.0 lb

## 2012-12-02 DIAGNOSIS — R490 Dysphonia: Secondary | ICD-10-CM

## 2012-12-02 DIAGNOSIS — Z23 Encounter for immunization: Secondary | ICD-10-CM

## 2012-12-02 NOTE — Patient Instructions (Signed)
Flu shot today. We will refer you to ENT at [facility].

## 2012-12-02 NOTE — Addendum Note (Signed)
Addended by: Josph Macho A on: 12/02/2012 03:25 PM   Modules accepted: Orders

## 2012-12-02 NOTE — Assessment & Plan Note (Signed)
Prior h/o thrush on vocal cords, prior seen by [facility].  Did not respond to 7d course of oral diflucan 100mg  daily.  Will refer back to ENT, appreciate their assistance.

## 2012-12-02 NOTE — Progress Notes (Signed)
  Subjective:    Patient ID: Erik Allen, male    DOB: April 24, 1955, 57 y.o.   MRN: [id number]  HPI CC: 1 mo f/u  Square was here last month with concern for recurrent vocal cord thrush, treated with 100mg  diflucan daily for 7 day course.  May have initially improved but quickly returned.  Several month history of hoarseness.  No better if rests voice.  Hasn't tried anything for this other than cough drops. Denies GERD sxs - controlled on prevacid. Denies allergic rhinitis sxs such as rhinorrhea or congestion or PNDrainage.  From last visit: Hoarseness has returned - wonders about thrush on vocal cords (h/o this in the past after eval by ENT 2 yrs go). Previously attributed to advair. Treated with oral antifungal and sxs improved in the past. Asthma - Persistent dyspnea especially at night. compliant with asthma medications (advair and singulair) Uses albuterol prn.   Flu shot today.  Past Medical History  Diagnosis Date  . Hyperlipemia 10/13/97  . GERD (gastroesophageal reflux disease) 10/13/01    ENT eval for GERD and thrush 2011  . Asthma 09/13/99  . HTN (hypertension)   . Seasonal allergic rhinitis     worse in spring  . History of kidney stones   . Systolic murmur   . BENIGN PROSTATIC HYPERTROPHY 05/14/2003    Qualifier: Diagnosis of  By: Hetty Ely MD, Franne Grip   . Disorder of vocal cord 2012    h/o thrush on vocal cords per patient    Past Surgical History  Procedure Laterality Date  . Cystoscopy  08/02    Stone retrieval  . Cystectomy      from back  . Foot capsule release w/ percutaneous heel cord lengthening, tibial tendon transfer  04/09    bilateral, Dr. Al Corpus  . Inguinal hernia repair  06/28/09    left, with mesh, Dr. Lemar Livings  . US echocardiography  09/01    TR, MR  . A1 pulley release  10/2010    stensosing tenosynovitis, R milddle, ring, little fingers  . Colonoscopy  2010    WNL, rpt due 10 yrs      Review of Systems Per HPI    Objective:   Physical Exam  Nursing note and vitals reviewed. Constitutional: He appears well-developed and well-nourished. No distress.  HENT:  Head: Normocephalic and atraumatic.  Right Ear: Hearing, tympanic membrane, external ear and ear canal normal.  Left Ear: Hearing, tympanic membrane, external ear and ear canal normal.  Nose: No mucosal edema or rhinorrhea.  Mouth/Throat: Uvula is midline, oropharynx is clear and moist and mucous membranes are normal. No oropharyngeal exudate, posterior oropharyngeal edema, posterior oropharyngeal erythema or tonsillar abscesses.  Hoarse raspy voice.  Eyes: Conjunctivae and EOM are normal. Pupils are equal, round, and reactive to light. No scleral icterus.  Neck: Normal range of motion. Neck supple.  Cardiovascular: Normal rate, regular rhythm, normal heart sounds and intact distal pulses.   No murmur heard. Pulmonary/Chest: Effort normal and breath sounds normal. No respiratory distress. He has no wheezes. He has no rales.       Assessment & Plan:

## 2012-12-16 ENCOUNTER — Encounter: Payer: Self-pay | Admitting: Family Medicine

## 2012-12-21 ENCOUNTER — Other Ambulatory Visit: Payer: Self-pay | Admitting: Family Medicine

## 2012-12-21 MED ORDER — LOSARTAN POTASSIUM-HCTZ 100-12.5 MG PO TABS: 1.0000 | ORAL_TABLET | Freq: Every day | ORAL | Status: DC

## 2013-01-10 ENCOUNTER — Other Ambulatory Visit: Payer: Self-pay | Admitting: Family Medicine

## 2013-01-16 ENCOUNTER — Ambulatory Visit (INDEPENDENT_AMBULATORY_CARE_PROVIDER_SITE_OTHER): Payer: 59 | Admitting: Family Medicine

## 2013-01-16 ENCOUNTER — Encounter: Payer: Self-pay | Admitting: Family Medicine

## 2013-01-16 VITALS — BP 126/78 | HR 86 | Temp 98.4°F | Wt 246.2 lb

## 2013-01-16 DIAGNOSIS — K219 Gastro-esophageal reflux disease without esophagitis: Secondary | ICD-10-CM

## 2013-01-16 DIAGNOSIS — Z23 Encounter for immunization: Secondary | ICD-10-CM

## 2013-01-16 DIAGNOSIS — R490 Dysphonia: Secondary | ICD-10-CM

## 2013-01-16 DIAGNOSIS — I10 Essential (primary) hypertension: Secondary | ICD-10-CM

## 2013-01-16 NOTE — Addendum Note (Signed)
Addended by: Josph Macho A on: 01/16/2013 03:40 PM   Modules accepted: Orders

## 2013-01-16 NOTE — Patient Instructions (Signed)
Let's stop potassium - and return in 2 weeks for lab visit. pneumonia shot today. Return in 5-6 months for physical. Good to see you today, call us with questions.

## 2013-01-16 NOTE — Progress Notes (Signed)
   Subjective:    Patient ID: Erik Allen, male    DOB: 1955-05-14, 57 y.o.   MRN: [id number]  HPI CC: 6 mo f/u  HTN - bp stable on current regimen.  Compliant with hyzaar 100/12.5  No HA, vision changes, CP/tightness, SOB, leg swelling.  Takes Kdur daily (since he's been on advair). HLD - compliant with simvastatin 80mg  nightly. Asthma - stable on advair, singulair, and uses albuterol PRN, not frequent use. Requests pneumonia shot today.  Hoarseness -s/p eval by Dr. [name] ENT - with addition of dexilant to prevacid 30mg  in am.  L vocal cord ulcer had healed on f/u.  Thought due to GERD.  Has f/u scheduled.  Notes worsening indigestion despite PPI double therapy.  Past Medical History  Diagnosis Date  . Hyperlipemia 10/13/97  . GERD (gastroesophageal reflux disease) 10/13/01    ENT eval for GERD and thrush 2011  . Asthma 09/13/99  . HTN (hypertension)   . Seasonal allergic rhinitis     worse in spring  . History of kidney stones   . Systolic murmur   . BENIGN PROSTATIC HYPERTROPHY 05/14/2003    Qualifier: Diagnosis of  By: Hetty Ely MD, Franne Grip   . Disorder of vocal cord 2012    h/o thrush on vocal cords per patient     Review of Systems Per HPI    Objective:   Physical Exam  Nursing note and vitals reviewed. Constitutional: He appears well-developed and well-nourished. No distress.  HENT:  Mouth/Throat: Oropharynx is clear and moist. No oropharyngeal exudate.  Cardiovascular: Normal rate, regular rhythm, normal heart sounds and intact distal pulses.   No murmur heard. Pulmonary/Chest: Effort normal and breath sounds normal. No respiratory distress. He has no wheezes. He has no rales.  Musculoskeletal: He exhibits no edema.       Assessment & Plan:

## 2013-01-16 NOTE — Assessment & Plan Note (Addendum)
Chronic, stable. Continue meds. Pt states he was on Kdur 2/2 advair use - will discontinue and ask him to return in 2 weeks for rpt labwork.

## 2013-01-16 NOTE — Assessment & Plan Note (Signed)
See above

## 2013-01-16 NOTE — Progress Notes (Signed)
Pre-visit discussion using our clinic review tool. No additional management support is needed unless otherwise documented below in the visit note.  

## 2013-01-16 NOTE — Assessment & Plan Note (Signed)
Deteriorated despite increase in PPI to prevacid 30mg  am and dexilant 60mg  pm. Will stop potassium and see if dyspepsia improves.

## 2013-01-30 ENCOUNTER — Other Ambulatory Visit (INDEPENDENT_AMBULATORY_CARE_PROVIDER_SITE_OTHER): Payer: 59

## 2013-01-30 DIAGNOSIS — I10 Essential (primary) hypertension: Secondary | ICD-10-CM

## 2013-01-30 LAB — BASIC METABOLIC PANEL
BUN: 19 mg/dL (ref 6–23)
CO2: 29 mEq/L (ref 19–32)
Calcium: 9.3 mg/dL (ref 8.4–10.5)
GFR: 78.69 mL/min (ref >60.00)
Glucose, Bld: 86 mg/dL (ref 70–99)
Potassium: 4.1 mEq/L (ref 3.5–5.1)
Sodium: 140 mEq/L (ref 135–145)

## 2013-02-03 ENCOUNTER — Other Ambulatory Visit: Payer: Self-pay | Admitting: Family Medicine

## 2013-02-09 ENCOUNTER — Other Ambulatory Visit: Payer: Self-pay | Admitting: Family Medicine

## 2013-03-26 ENCOUNTER — Encounter: Payer: Self-pay | Admitting: Family Medicine

## 2013-03-26 ENCOUNTER — Ambulatory Visit (INDEPENDENT_AMBULATORY_CARE_PROVIDER_SITE_OTHER): Payer: 59 | Admitting: Family Medicine

## 2013-03-26 VITALS — BP 140/84 | HR 84 | Temp 98.3°F | Wt 243.0 lb

## 2013-03-26 DIAGNOSIS — M545 Low back pain, unspecified: Secondary | ICD-10-CM

## 2013-03-26 DIAGNOSIS — M25561 Pain in right knee: Secondary | ICD-10-CM | POA: Insufficient documentation

## 2013-03-26 MED ORDER — NAPROXEN 500 MG PO TABS: ORAL_TABLET | ORAL | Status: DC

## 2013-03-26 NOTE — Assessment & Plan Note (Signed)
Anticipate R sided sciatica - treat supportively with prescription strength naprosyn and exercises from [facility] pt advisor. Also encouraged ice/heating pad. If persistent sxs, would set up physical therapy. Anticipate R knee strain from favoring that leg 2/2 pain

## 2013-03-26 NOTE — Patient Instructions (Addendum)
I think you have sciatica of the R side and this has led to some irritation of R knee. Treat with stretching exercises provided today, prescription strength naprosyn for 5 days then as needed (take with food). Ice or heating pad (whichever soothes better). Let me know if not improving with this.

## 2013-03-26 NOTE — Progress Notes (Signed)
   BP 140/84  Pulse 84  Temp(Src) 98.3 F (36.8 C) (Oral)  Wt 243 lb (110.224 kg)   CC: R hip and knee pain  Subjective:    Patient ID: Erik Allen, male    DOB: 1955/08/30, 58 y.o.   MRN: [id number]  HPI: Erik Allen is a 58 y.o. male presenting on 03/26/2013 with Hip Pain and Knee Pain  2 wk h/o R hip pain.  Points to lateral hip as site of pain.  Also describes pain at R buttock travelling down posterior thigh.  Denies inciting trauma/injury to R side.  Worse with prolonged sitting and walking. Also noticing R knee popping and snapping but not painful. Hasn't tried any meds other than salon paws cream which didn't help.  Wt Readings from Last 3 Encounters:  03/26/13 243 lb (110.224 kg)  01/16/13 246 lb 4 oz (111.698 kg)  12/02/12 247 lb (112.038 kg)  trying to lose weight through healthy eating.  Relevant past medical, surgical, family and social history reviewed and updated. Allergies and medications reviewed and updated. Current Outpatient Prescriptions on File Prior to Visit  Medication Sig  . ADVAIR DISKUS 250-50 MCG/DOSE AEPB IHALE 1 PUFF 2 TIMES A DAY  . albuterol (PROAIR HFA) 108 (90 BASE) MCG/ACT inhaler Inhale 2 puffs into the lungs every 6 (six) hours as needed for wheezing.  Marland Kitchen dexlansoprazole (DEXILANT) 60 MG capsule Take 60 mg by mouth daily.  . finasteride (PROSCAR) 5 MG tablet TAKE 1 TABLET BY MOUTH EVERY DAY  . losartan-hydrochlorothiazide (HYZAAR) 100-12.5 MG per tablet Take 1 tablet by mouth daily.  . montelukast (SINGULAIR) 10 MG tablet TAKE 1 TABLET BY MOUTH AT BEDTIME  . simvastatin (ZOCOR) 80 MG tablet TAKE 1 TABLET BY MOUTH AT BEDTIME   No current facility-administered medications on file prior to visit.    Review of Systems Per HPI unless specifically indicated above    Objective:    BP 140/84  Pulse 84  Temp(Src) 98.3 F (36.8 C) (Oral)  Wt 243 lb (110.224 kg)  Physical Exam  Nursing note and vitals reviewed. Constitutional: He  appears well-developed and well-nourished. No distress.  Musculoskeletal: He exhibits no edema.  No midline lumbar spine tenderness or paraspinous mm tenderness Neg SLR bilaterally No pain with int/ext rotation of hips. Mild discomfort with FABER on right Tender at R SIJ and mainly at sciatic notch - reproduces his pain.  Not tender at [place].  Knees: No deformity effusion or erythema.  FROM flexion/extension with mild crepitus R>L, no popliteal fullness. R knee tender diffusely to palpation at R medial > lateral joint lines Neg drawer, mcmurray, no pain with valgus/varus testing, no abnormal patellar mobility, no PFgrind.       Assessment & Plan:   Problem List Items Addressed This Visit   Right low back pain - Primary     Anticipate R sided sciatica - treat supportively with prescription strength naprosyn and exercises from [facility] pt advisor. Also encouraged ice/heating pad. If persistent sxs, would set up physical therapy. Anticipate R knee strain from favoring that leg 2/2 pain    Relevant Medications      naproxen (NAPROSYN) tablet       Follow up plan: Return if symptoms worsen or fail to improve.

## 2013-03-26 NOTE — Progress Notes (Signed)
Pre-visit discussion using our clinic review tool. No additional management support is needed unless otherwise documented below in the visit note.  

## 2013-04-06 ENCOUNTER — Telehealth: Payer: Self-pay | Admitting: *Deleted

## 2013-04-06 NOTE — Telephone Encounter (Signed)
Received letter from mail order pharmacy regarding Advair Diskus. Letter is in your in box. Please advise.

## 2013-04-07 NOTE — Telephone Encounter (Signed)
Reviewed. Consider stepdown to asthma monotherapy at next OV.

## 2013-05-30 ENCOUNTER — Other Ambulatory Visit: Payer: Self-pay | Admitting: Family Medicine

## 2013-06-10 ENCOUNTER — Other Ambulatory Visit (INDEPENDENT_AMBULATORY_CARE_PROVIDER_SITE_OTHER): Payer: 59

## 2013-06-10 ENCOUNTER — Other Ambulatory Visit: Payer: Self-pay | Admitting: Family Medicine

## 2013-06-10 DIAGNOSIS — R740 Nonspecific elevation of levels of transaminase and lactic acid dehydrogenase [LDH]: Secondary | ICD-10-CM

## 2013-06-10 DIAGNOSIS — Z125 Encounter for screening for malignant neoplasm of prostate: Secondary | ICD-10-CM

## 2013-06-10 DIAGNOSIS — E785 Hyperlipidemia, unspecified: Secondary | ICD-10-CM

## 2013-06-10 DIAGNOSIS — N400 Enlarged prostate without lower urinary tract symptoms: Secondary | ICD-10-CM

## 2013-06-10 DIAGNOSIS — R7402 Elevation of levels of lactic acid dehydrogenase (LDH): Secondary | ICD-10-CM

## 2013-06-10 DIAGNOSIS — Z0000 Encounter for general adult medical examination without abnormal findings: Secondary | ICD-10-CM

## 2013-06-10 DIAGNOSIS — R7401 Elevation of levels of liver transaminase levels: Secondary | ICD-10-CM

## 2013-06-10 DIAGNOSIS — R7989 Other specified abnormal findings of blood chemistry: Secondary | ICD-10-CM

## 2013-06-10 DIAGNOSIS — I10 Essential (primary) hypertension: Secondary | ICD-10-CM

## 2013-06-10 LAB — COMPREHENSIVE METABOLIC PANEL
ALT: 63 U/L — ABNORMAL HIGH (ref 0–53)
AST: 40 U/L — ABNORMAL HIGH (ref 0–37)
Albumin: 3.8 g/dL (ref 3.5–5.2)
Alkaline Phosphatase: 55 U/L (ref 39–117)
BUN: 25 mg/dL — ABNORMAL HIGH (ref 6–23)
CO2: 28 mEq/L (ref 19–32)
CREATININE: 0.9 mg/dL (ref 0.4–1.5)
Calcium: 9 mg/dL (ref 8.4–10.5)
Chloride: 103 mEq/L (ref 96–112)
GFR: 91.68 mL/min (ref >60.00)
GLUCOSE: 99 mg/dL (ref 70–99)
Potassium: 4.6 mEq/L (ref 3.5–5.1)
Sodium: 138 mEq/L (ref 135–145)
Total Bilirubin: 0.2 mg/dL — ABNORMAL LOW (ref 0.3–1.2)
Total Protein: 6.4 g/dL (ref 6.0–8.3)

## 2013-06-10 LAB — LIPID PANEL
CHOLESTEROL: 164 mg/dL (ref 0–200)
HDL: 53 mg/dL (ref >39.00)
LDL CALC: 95 mg/dL (ref 0–99)
TRIGLYCERIDES: 79 mg/dL (ref 0.0–149.0)
Total CHOL/HDL Ratio: 3
VLDL: 15.8 mg/dL (ref 0.0–40.0)

## 2013-06-10 LAB — PSA: PSA: 0.13 ng/mL (ref 0.10–4.00)

## 2013-06-17 ENCOUNTER — Telehealth: Payer: Self-pay | Admitting: Family Medicine

## 2013-06-17 ENCOUNTER — Encounter: Payer: Self-pay | Admitting: Family Medicine

## 2013-06-17 ENCOUNTER — Ambulatory Visit (INDEPENDENT_AMBULATORY_CARE_PROVIDER_SITE_OTHER): Payer: 59 | Admitting: Family Medicine

## 2013-06-17 VITALS — BP 132/78 | HR 80 | Temp 98.0°F | Ht 69.75 in | Wt 246.2 lb

## 2013-06-17 DIAGNOSIS — R740 Nonspecific elevation of levels of transaminase and lactic acid dehydrogenase [LDH]: Secondary | ICD-10-CM

## 2013-06-17 DIAGNOSIS — J45909 Unspecified asthma, uncomplicated: Secondary | ICD-10-CM

## 2013-06-17 DIAGNOSIS — E785 Hyperlipidemia, unspecified: Secondary | ICD-10-CM

## 2013-06-17 DIAGNOSIS — I10 Essential (primary) hypertension: Secondary | ICD-10-CM

## 2013-06-17 DIAGNOSIS — R7402 Elevation of levels of lactic acid dehydrogenase (LDH): Secondary | ICD-10-CM

## 2013-06-17 DIAGNOSIS — K219 Gastro-esophageal reflux disease without esophagitis: Secondary | ICD-10-CM

## 2013-06-17 DIAGNOSIS — R7401 Elevation of levels of liver transaminase levels: Secondary | ICD-10-CM

## 2013-06-17 DIAGNOSIS — N400 Enlarged prostate without lower urinary tract symptoms: Secondary | ICD-10-CM

## 2013-06-17 DIAGNOSIS — Z0000 Encounter for general adult medical examination without abnormal findings: Secondary | ICD-10-CM

## 2013-06-17 DIAGNOSIS — E669 Obesity, unspecified: Secondary | ICD-10-CM

## 2013-06-17 NOTE — Assessment & Plan Note (Signed)
Continue meds. 

## 2013-06-17 NOTE — Assessment & Plan Note (Signed)
Continue to encourage increased exercise.

## 2013-06-17 NOTE — Progress Notes (Signed)
Pre visit review using our clinic review tool, if applicable. No additional management support is needed unless otherwise documented below in the visit note. 

## 2013-06-17 NOTE — Assessment & Plan Note (Signed)
Chronic, stable. Continue zocor. 

## 2013-06-17 NOTE — Assessment & Plan Note (Signed)
Preventative protocols reviewed and updated unless pt declined. Discussed healthy diet and lifestyle.  Continue to encourage regular exercise.

## 2013-06-17 NOTE — Assessment & Plan Note (Signed)
Anticipate fatty liver related.  Return in 2-3 mo for labwork (LFT, hep panel, iron panel, and TSH) and persistently elevated check abd [place].

## 2013-06-17 NOTE — Assessment & Plan Note (Signed)
Continue dexilant 

## 2013-06-17 NOTE — Assessment & Plan Note (Signed)
Chronic, stable. Continue meds. 

## 2013-06-17 NOTE — Assessment & Plan Note (Signed)
Stable. Continue proscar. Discussed adding flomax, will not do for now.

## 2013-06-17 NOTE — Progress Notes (Signed)
BP 132/78  Pulse 80  Temp(Src) 98 F (36.7 C) (Oral)  Ht 5' 9.75" (1.772 m)  Wt 246 lb 4 oz (111.698 kg)  BMI 35.57 kg/m2   CC: CPE  Subjective:    Patient ID: Erik Allen, male    DOB: [birth year], 58 y.o.   MRN: [id number]  HPI: Erik Allen is a 58 y.o. male presenting on 06/17/2013 for Annual Exam   Wt Readings from Last 3 Encounters:  06/17/13 246 lb 4 oz (111.698 kg)  03/26/13 243 lb (110.224 kg)  01/16/13 246 lb 4 oz (111.698 kg)   Body mass index is 35.57 kg/(m^2).   transaminitis - chronically mildly elevated.  Will need labwork for further eval.  Preventative: Colonsocopy - 2010 WNL, rpt due 10 yrs. No BM changes, blood in stool Prostate - last checked last year, normal. Not strong stream but "good stream", nocturia x1-3.  Known BPH on proscar. 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  Allergies and medications reviewed and updated. Current Outpatient Prescriptions on File Prior to Visit  Medication Sig  . ADVAIR DISKUS 250-50 MCG/DOSE AEPB IHALE 1 PUFF 2 TIMES A DAY  . albuterol (PROAIR HFA) 108 (90 BASE) MCG/ACT inhaler Inhale 2 puffs into the lungs every 6 (six) hours as needed for wheezing.  Marland Kitchen dexlansoprazole (DEXILANT) 60 MG capsule Take 60 mg by mouth daily.  . finasteride (PROSCAR) 5 MG tablet TAKE 1 TABLET BY MOUTH EVERY DAY  . losartan-hydrochlorothiazide (HYZAAR) 100-12.5 MG per tablet Take 1 tablet by mouth daily.  . montelukast (SINGULAIR) 10 MG tablet TAKE 1 TABLET BY MOUTH AT BEDTIME  . simvastatin (ZOCOR) 80 MG tablet TAKE  1 TABLET BY MOUTH AT BEDTIME   No current facility-administered medications on file prior to visit.    Review of Systems  Constitutional: Negative for fever, chills, activity change, appetite change, fatigue and unexpected weight change.  HENT: Negative for hearing loss.   Eyes: Negative for visual disturbance.  Respiratory: Positive for cough (mild) and chest tightness (occasional, not exertion related or relieved by rest). Negative for shortness of breath and wheezing.        Complaint with asthma meds (advair, singulair, and albuterol prn)  Cardiovascular: Negative for chest pain, palpitations and leg swelling.  Gastrointestinal: Negative for nausea, vomiting, abdominal pain, diarrhea, constipation, blood in stool and abdominal distention.  Genitourinary: Negative for hematuria and difficulty urinating.  Musculoskeletal: Negative for arthralgias, myalgias and neck pain.  Skin: Negative for rash.  Neurological: Positive for headaches (sinus related). Negative for dizziness, seizures and syncope.  Hematological: Negative for adenopathy. Does not bruise/bleed easily.  Psychiatric/Behavioral: Negative for dysphoric mood. The patient is not nervous/anxious.    Per HPI unless specifically indicated above    Objective:    BP 132/78  Pulse 80  Temp(Src) 98 F (36.7 C) (Oral)  Ht 5' 9.75" (1.772 m)  Wt 246 lb 4 oz (111.698 kg)  BMI 35.57 kg/m2  Physical Exam  Nursing note and vitals reviewed. Constitutional: He is oriented to person, place, and time. He appears well-developed and well-nourished. No distress.  obese  HENT:  Head:  Normocephalic and atraumatic.  Right Ear: Hearing, tympanic membrane, external ear and ear canal normal.  Left Ear: Hearing, tympanic membrane, external ear and ear canal normal.  Nose: Nose normal.  Mouth/Throat: Uvula is midline, oropharynx is clear and moist and mucous membranes are normal. No oropharyngeal exudate, posterior oropharyngeal edema or  posterior oropharyngeal erythema.  Eyes: Conjunctivae and EOM are normal. Pupils are equal, round, and reactive to light. No scleral icterus.  Neck: Normal range of motion. Neck supple.  Cardiovascular: Normal rate, regular rhythm, normal heart sounds and intact distal pulses.   No murmur heard. Pulses:      Radial pulses are 2+ on the right side, and 2+ on the left side.  Pulmonary/Chest: Effort normal and breath sounds normal. No respiratory distress. He has no wheezes. He has no rales.  Abdominal: Soft. Bowel sounds are normal. He exhibits no distension and no mass. There is no tenderness. There is no rebound and no guarding.  Genitourinary: Rectum normal and prostate normal. Rectal exam shows no external hemorrhoid, no internal hemorrhoid, no fissure, no mass, no tenderness and anal tone normal. Prostate is not enlarged (20gm) and not tender.  Musculoskeletal: Normal range of motion. He exhibits no edema.  Lymphadenopathy:    He has no cervical adenopathy.  Neurological: He is alert and oriented to person, place, and time.  CN grossly intact, station and gait intact  Skin: Skin is warm and dry. No rash noted.  Psychiatric: He has a normal mood and affect. His behavior is normal. Judgment and thought content normal.   Results for orders placed in visit on 06/10/13  LIPID PANEL      Result Value Ref Range   Cholesterol 164  0 - 200 mg/dL   Triglycerides 79.0  0.0 - 149.0 mg/dL   HDL 53.00  >39.00 mg/dL   VLDL 15.8  0.0 - 40.0 mg/dL   LDL Cholesterol 95  0 - 99 mg/dL   Total CHOL/HDL Ratio 3    COMPREHENSIVE METABOLIC PANEL      Result Value Ref Range   Sodium 138  135 - 145 mEq/L   Potassium 4.6  3.5 - 5.1 mEq/L   Chloride 103  96 - 112 mEq/L   CO2 28  19 - 32 mEq/L   Glucose, Bld 99  70 - 99 mg/dL   BUN 25 (*) 6 - 23 mg/dL   Creatinine, Ser 0.9  0.4 - 1.5 mg/dL   Total Bilirubin 0.2 (*) 0.3 - 1.2 mg/dL   Alkaline Phosphatase 55  39 - 117 U/L   AST 40 (*) 0 - 37 U/L   ALT 63  (*) 0 - 53 U/L   Total Protein 6.4  6.0 - 8.3 g/dL   Albumin 3.8  3.5 - 5.2 g/dL   Calcium 9.0  8.4 - 10.5 mg/dL   GFR 91.68  >60.00 mL/min  PSA      Result Value Ref Range   PSA 0.13  0.10 - 4.00 ng/mL      Assessment & Plan:   Problem List Items Addressed This Visit   HYPERLIPIDEMIA     Chronic, stable. Continue zocor.    HYPERTENSION     Chronic, stable. Continue meds.    ASTHMA     Continue meds.    GERD     Continue dexilant.    BENIGN PROSTATIC HYPERTROPHY     Stable. Continue proscar. Discussed adding flomax, will not do for now.    Healthcare maintenance - Primary  Preventative protocols reviewed and updated unless pt declined. Discussed healthy diet and lifestyle.  Continue to encourage regular exercise.    Obesity     Continue to encourage increased exercise.    Transaminitis     Anticipate fatty liver related.  Return in 2-3 mo for labwork (LFT, hep panel, iron panel, and TSH) and persistently elevated check abd [place].    Relevant Orders      Hepatitis panel, acute      Hepatic Function Panel      TSH      IBC panel       Follow up plan: Return in about 6 months (around 12/18/2013), or as needed, for follow up.

## 2013-06-17 NOTE — Telephone Encounter (Signed)
Relevant patient education mailed to patient.  

## 2013-06-17 NOTE — Patient Instructions (Signed)
Good to see you today, blood work looking well. I'd like you to return in 2-3 months for labwork to recheck liver function and some other tests.  If staying a bit elevated ,we will check liver ultrasound. In the meantime, continue avoiding sun, increase regular activity for goal weight loss.

## 2013-09-17 ENCOUNTER — Other Ambulatory Visit (INDEPENDENT_AMBULATORY_CARE_PROVIDER_SITE_OTHER): Payer: 59

## 2013-09-17 DIAGNOSIS — E785 Hyperlipidemia, unspecified: Secondary | ICD-10-CM

## 2013-09-17 DIAGNOSIS — R7989 Other specified abnormal findings of blood chemistry: Secondary | ICD-10-CM

## 2013-09-17 DIAGNOSIS — I10 Essential (primary) hypertension: Secondary | ICD-10-CM

## 2013-09-17 DIAGNOSIS — R740 Nonspecific elevation of levels of transaminase and lactic acid dehydrogenase [LDH]: Principal | ICD-10-CM

## 2013-09-17 DIAGNOSIS — R7401 Elevation of levels of liver transaminase levels: Secondary | ICD-10-CM

## 2013-09-17 LAB — IBC PANEL
IRON: 87 ug/dL (ref 42–165)
Saturation Ratios: 26.8 % (ref 20.0–50.0)
TRANSFERRIN: 232 mg/dL (ref 212.0–360.0)

## 2013-09-17 LAB — HEPATIC FUNCTION PANEL
ALK PHOS: 52 U/L (ref 39–117)
ALT: 55 U/L — ABNORMAL HIGH (ref 0–53)
AST: 34 U/L (ref 0–37)
Albumin: 3.8 g/dL (ref 3.5–5.2)
BILIRUBIN DIRECT: 0.1 mg/dL (ref 0.0–0.3)
BILIRUBIN TOTAL: 0.6 mg/dL (ref 0.2–1.2)
TOTAL PROTEIN: 7 g/dL (ref 6.0–8.3)

## 2013-09-17 LAB — TSH: TSH: 0.84 u[IU]/mL (ref 0.35–4.50)

## 2013-09-18 LAB — HEPATITIS PANEL, ACUTE
HCV Ab: NEGATIVE
HEP B C IGM: NONREACTIVE
Hep A IgM: NONREACTIVE
Hepatitis B Surface Ag: NEGATIVE

## 2013-09-19 ENCOUNTER — Encounter: Payer: Self-pay | Admitting: Family Medicine

## 2013-09-21 ENCOUNTER — Encounter: Payer: Self-pay | Admitting: *Deleted

## 2013-09-29 ENCOUNTER — Other Ambulatory Visit: Payer: Self-pay | Admitting: Family Medicine

## 2013-11-20 ENCOUNTER — Ambulatory Visit (INDEPENDENT_AMBULATORY_CARE_PROVIDER_SITE_OTHER): Payer: 59

## 2013-11-20 ENCOUNTER — Encounter: Payer: Self-pay | Admitting: Gastroenterology

## 2013-11-20 DIAGNOSIS — Z23 Encounter for immunization: Secondary | ICD-10-CM

## 2013-12-19 ENCOUNTER — Other Ambulatory Visit: Payer: Self-pay | Admitting: Family Medicine

## 2013-12-21 ENCOUNTER — Ambulatory Visit (INDEPENDENT_AMBULATORY_CARE_PROVIDER_SITE_OTHER): Payer: 59 | Admitting: Family Medicine

## 2013-12-21 ENCOUNTER — Encounter: Payer: Self-pay | Admitting: Family Medicine

## 2013-12-21 VITALS — BP 132/82 | HR 80 | Temp 98.6°F | Wt 241.5 lb

## 2013-12-21 DIAGNOSIS — E785 Hyperlipidemia, unspecified: Secondary | ICD-10-CM

## 2013-12-21 DIAGNOSIS — J4520 Mild intermittent asthma, uncomplicated: Secondary | ICD-10-CM

## 2013-12-21 DIAGNOSIS — M5416 Radiculopathy, lumbar region: Secondary | ICD-10-CM | POA: Insufficient documentation

## 2013-12-21 DIAGNOSIS — R7401 Elevation of levels of liver transaminase levels: Secondary | ICD-10-CM

## 2013-12-21 DIAGNOSIS — R740 Nonspecific elevation of levels of transaminase and lactic acid dehydrogenase [LDH]: Secondary | ICD-10-CM

## 2013-12-21 DIAGNOSIS — I10 Essential (primary) hypertension: Secondary | ICD-10-CM

## 2013-12-21 DIAGNOSIS — E669 Obesity, unspecified: Secondary | ICD-10-CM

## 2013-12-21 DIAGNOSIS — M5417 Radiculopathy, lumbosacral region: Secondary | ICD-10-CM

## 2013-12-21 DIAGNOSIS — K219 Gastro-esophageal reflux disease without esophagitis: Secondary | ICD-10-CM

## 2013-12-21 MED ORDER — METHOCARBAMOL 500 MG PO TABS: 500.0000 mg | ORAL_TABLET | Freq: Three times a day (TID) | ORAL | Status: DC | PRN

## 2013-12-21 MED ORDER — ALBUTEROL SULFATE HFA 108 (90 BASE) MCG/ACT IN AERS: 2.0000 | INHALATION_SPRAY | Freq: Four times a day (QID) | RESPIRATORY_TRACT | Status: DC | PRN

## 2013-12-21 NOTE — Patient Instructions (Addendum)
Continue meds as up to now. I have refilled albuteorl. I think you have combination of muscle strain and some pinching of nerve from back. Treat with aleve OTC twice daily with food for 5 days, muscle relaxant sent to pharmacy, and do back exercises provided today. If not improving over next 2 weeks, we will get xrays of lower back and may refer you to physical therapy. Keep walking.  Return as needed or in 6 months for physical.

## 2013-12-21 NOTE — Assessment & Plan Note (Signed)
Only mildly elevated last check. Recheck next CPE, if persistently elevated obtain abd [place]. Pt agrees with plan. Anticipate due to fatty liver.

## 2013-12-21 NOTE — Assessment & Plan Note (Signed)
Pt has recently restarted walking - encouraged continue this for goal weight loss. Body mass index is 34.89 kg/(m^2).

## 2013-12-21 NOTE — Assessment & Plan Note (Signed)
Continue dexilant. S/p eval by ENT.

## 2013-12-21 NOTE — Assessment & Plan Note (Signed)
Chronic, stable. Continue zocor. 

## 2013-12-21 NOTE — Progress Notes (Signed)
BP 132/82 mmHg  Pulse 80  Temp(Src) 98.6 F (37 C) (Oral)  Wt 241 lb 8 oz (109.544 kg)   CC: 6 mo f/u visit  Subjective:    Patient ID: Erik Allen, male    DOB: 07/25/1955, 58 y.o.   MRN: [id number]  HPI: Erik Allen is a 58 y.o. male presenting on 12/21/2013 for Follow-up   R lower back pain ongoing for last few months that radiates to R buttock worse with prolonged standing, improved with sitting. Has been treating with blue emu and other pain creams. Does not feel like previous kidney stones. No fevers, weakness/numbness, bowel/bladder accidents, denies inciting trauma.   Mild transaminitis - s/p normal initial lab workup. Has not had abd [place] in past.   HTN - Compliant with current antihypertensive regimen of hyzaar 100/12.5mg  daily.  Does check blood pressures at home: but doesn't trust cuff readings (elevated). No low blood pressure readings or symptoms of dizziness/syncope.  Denies vision changes, CP/tightness, SOB, leg swelling. Occasional headaches.  HLD - complaint with simvastatin without myalgias.   Asthma - compliant with advair, singulair, and albuterol prn. Cough present worse at night.   GERD - compliant with dexilant 60mg  every evening prior to supper without breakthrough sxs. This medicine is expensive.   Relevant past medical, surgical, family and social history reviewed and updated as indicated.  Allergies and medications reviewed and updated. Current Outpatient Prescriptions on File Prior to Visit  Medication Sig  . ADVAIR DISKUS 250-50 MCG/DOSE AEPB IHALE 1 PUFF 2 TIMES A DAY  . dexlansoprazole (DEXILANT) 60 MG capsule Take 60 mg by mouth daily.  . finasteride (PROSCAR) 5 MG tablet TAKE 1 TABLET BY MOUTH EVERY DAY  . losartan-hydrochlorothiazide (HYZAAR) 100-12.5 MG per tablet Take 1 tablet by mouth daily.  . montelukast (SINGULAIR) 10 MG tablet TAKE 1 TABLET BY MOUTH AT BEDTIME  . simvastatin (ZOCOR) 80 MG tablet TAKE 1 TABLET BY MOUTH AT BEDTIME    No current facility-administered medications on file prior to visit.    Review of Systems Per HPI unless specifically indicated above    Objective:    BP 132/82 mmHg  Pulse 80  Temp(Src) 98.6 F (37 C) (Oral)  Wt 241 lb 8 oz (109.544 kg)  Physical Exam  Constitutional: He appears well-developed and well-nourished. No distress.  HENT:  Mouth/Throat: Oropharynx is clear and moist. No oropharyngeal exudate.  Eyes: Conjunctivae and EOM are normal. Pupils are equal, round, and reactive to light. No scleral icterus.  Cardiovascular: Normal rate, regular rhythm, normal heart sounds and intact distal pulses.   No murmur heard. Pulmonary/Chest: Effort normal and breath sounds normal. No respiratory distress. He has no wheezes. He has no rales.  Musculoskeletal: He exhibits no edema.  No pain midline spine + pain at right lower thoracic and upper lumbar paraspinous mm as well as transverse process on right + SLR R>L (burning pain) No pain with int/ext rotation at hip. Neg FABER. No pain at SIJ, GTB or sciatic notch bilaterally.   Neurological: He has normal strength. No sensory deficit.  5/5 strength BLE  Skin: Skin is warm and dry. No rash noted.  Nursing note and vitals reviewed.  Results for orders placed or performed in visit on 09/17/13  Hepatitis panel, acute  Result Value Ref Range   Hepatitis B Surface Ag NEGATIVE NEGATIVE   HCV Ab NEGATIVE NEGATIVE   Hep B C IgM NON REACTIVE NON REACTIVE   Hep A IgM NON REACTIVE NON  REACTIVE  Hepatic Function Panel  Result Value Ref Range   Total Bilirubin 0.6 0.2 - 1.2 mg/dL   Bilirubin, Direct 0.1 0.0 - 0.3 mg/dL   Alkaline Phosphatase 52 39 - 117 U/L   AST 34 0 - 37 U/L   ALT 55 (H) 0 - 53 U/L   Total Protein 7.0 6.0 - 8.3 g/dL   Albumin 3.8 3.5 - 5.2 g/dL  TSH  Result Value Ref Range   TSH 0.84 0.35 - 4.50 uIU/mL  IBC panel  Result Value Ref Range   Iron 87 42 - 165 ug/dL   Transferrin 232.0 212.0 - 360.0 mg/dL    Saturation Ratios 26.8 20.0 - 50.0 %      Assessment & Plan:   Problem List Items Addressed This Visit    Transaminitis    Only mildly elevated last check. Recheck next CPE, if persistently elevated obtain abd [place]. Pt agrees with plan. Anticipate due to fatty liver.    Obesity    Pt has recently restarted walking - encouraged continue this for goal weight loss. Body mass index is 34.89 kg/(m^2).     Lumbar back pain with radiculopathy affecting right lower extremity - Primary    Exam consistent with MSK cause. Anticipate combination of lumbar strain and possible pinched nerve from arthritis vs HNP. Treat with aleve OTC x 5 days (short course given GERD hx), muscle relaxant and stretching exercises from [facility] pt advisor. If no better, discussed return for xray and would refer to PT. Pt agrees with plan.    Relevant Medications      methocarbamol (ROBAXIN) tablet   HLD (hyperlipidemia)    Chronic, stable. Continue zocor.    GERD    Continue dexilant. S/p eval by ENT.    Essential hypertension    Chronic, stable. Continue hyzaar.    Asthma    Chronic, stable - continue daily meds.    Relevant Medications      albuterol (PROAIR HFA) 108 (90 BASE) MCG/ACT inhaler       Follow up plan: Return in about 6 months (around 06/21/2014), or as needed, for annual exam, prior fasting for blood work.

## 2013-12-21 NOTE — Assessment & Plan Note (Signed)
Exam consistent with MSK cause. Anticipate combination of lumbar strain and possible pinched nerve from arthritis vs HNP. Treat with aleve OTC x 5 days (short course given GERD hx), muscle relaxant and stretching exercises from [facility] pt advisor. If no better, discussed return for xray and would refer to PT. Pt agrees with plan.

## 2013-12-21 NOTE — Progress Notes (Signed)
Pre visit review using our clinic review tool, if applicable. No additional management support is needed unless otherwise documented below in the visit note. 

## 2013-12-21 NOTE — Assessment & Plan Note (Signed)
Chronic, stable - continue daily meds.

## 2013-12-21 NOTE — Assessment & Plan Note (Signed)
Chronic, stable. Continue hyzaar.

## 2014-01-02 ENCOUNTER — Other Ambulatory Visit: Payer: Self-pay | Admitting: Family Medicine

## 2014-01-12 ENCOUNTER — Telehealth: Payer: Self-pay

## 2014-01-12 DIAGNOSIS — M5416 Radiculopathy, lumbar region: Secondary | ICD-10-CM

## 2014-01-12 NOTE — Telephone Encounter (Signed)
Xray and PT referral placed. plz have him come in for xrays and then to see Rosaria Ferries or Vaughan Basta to schedule PT.

## 2014-01-12 NOTE — Telephone Encounter (Signed)
Pt left v/m; pt was seen 12/21/13 with back pain; pt has done as advised at office visit but pts back pain is no better; pt was advised to cb in 2 weeks if no better for possible low back xrays and PT. Pt request cb.

## 2014-01-13 NOTE — Telephone Encounter (Signed)
Patient notified and will come in Friday for xrays.

## 2014-01-13 NOTE — Telephone Encounter (Signed)
Message left for patient to return my call.  

## 2014-01-15 ENCOUNTER — Ambulatory Visit (INDEPENDENT_AMBULATORY_CARE_PROVIDER_SITE_OTHER)
Admission: RE | Admit: 2014-01-15 | Discharge: 2014-01-15 | Disposition: A | Discharge status: Home / Self Care | Payer: 59 | Source: Ambulatory Visit | Attending: Family Medicine | Admitting: Family Medicine

## 2014-01-15 DIAGNOSIS — M5416 Radiculopathy, lumbar region: Secondary | ICD-10-CM

## 2014-01-15 DIAGNOSIS — M5417 Radiculopathy, lumbosacral region: Secondary | ICD-10-CM

## 2014-01-19 ENCOUNTER — Other Ambulatory Visit: Payer: Self-pay | Admitting: Family Medicine

## 2014-01-19 DIAGNOSIS — M5416 Radiculopathy, lumbar region: Secondary | ICD-10-CM

## 2014-01-19 DIAGNOSIS — R9389 Abnormal findings on diagnostic imaging of other specified body structures: Secondary | ICD-10-CM

## 2014-01-29 ENCOUNTER — Ambulatory Visit
Admission: RE | Admit: 2014-01-29 | Discharge: 2014-01-29 | Disposition: A | Discharge status: Home / Self Care | Payer: 59 | Source: Ambulatory Visit | Attending: Family Medicine | Admitting: Family Medicine

## 2014-01-29 DIAGNOSIS — R9389 Abnormal findings on diagnostic imaging of other specified body structures: Secondary | ICD-10-CM

## 2014-01-29 DIAGNOSIS — M5416 Radiculopathy, lumbar region: Secondary | ICD-10-CM

## 2014-02-04 ENCOUNTER — Other Ambulatory Visit: Payer: Self-pay | Admitting: Family Medicine

## 2014-03-24 ENCOUNTER — Other Ambulatory Visit: Payer: Self-pay | Admitting: Family Medicine

## 2014-06-12 ENCOUNTER — Other Ambulatory Visit: Payer: Self-pay | Admitting: Family Medicine

## 2014-06-12 DIAGNOSIS — N400 Enlarged prostate without lower urinary tract symptoms: Secondary | ICD-10-CM

## 2014-06-12 DIAGNOSIS — E785 Hyperlipidemia, unspecified: Secondary | ICD-10-CM

## 2014-06-14 ENCOUNTER — Other Ambulatory Visit (INDEPENDENT_AMBULATORY_CARE_PROVIDER_SITE_OTHER): Payer: 59

## 2014-06-14 DIAGNOSIS — E785 Hyperlipidemia, unspecified: Secondary | ICD-10-CM

## 2014-06-14 DIAGNOSIS — N400 Enlarged prostate without lower urinary tract symptoms: Secondary | ICD-10-CM | POA: Diagnosis not present

## 2014-06-14 LAB — COMPREHENSIVE METABOLIC PANEL
ALT: 61 U/L — ABNORMAL HIGH (ref 0–53)
AST: 35 U/L (ref 0–37)
Albumin: 3.8 g/dL (ref 3.5–5.2)
Alkaline Phosphatase: 54 U/L (ref 39–117)
BUN: 21 mg/dL (ref 6–23)
CALCIUM: 9 mg/dL (ref 8.4–10.5)
CHLORIDE: 102 meq/L (ref 96–112)
CO2: 29 mEq/L (ref 19–32)
Creatinine, Ser: 1.05 mg/dL (ref 0.40–1.50)
GFR: 77.44 mL/min (ref >60.00)
Glucose, Bld: 104 mg/dL — ABNORMAL HIGH (ref 70–99)
Potassium: 4.1 mEq/L (ref 3.5–5.1)
Sodium: 139 mEq/L (ref 135–145)
Total Bilirubin: 0.4 mg/dL (ref 0.2–1.2)
Total Protein: 6.6 g/dL (ref 6.0–8.3)

## 2014-06-14 LAB — LIPID PANEL
Cholesterol: 164 mg/dL (ref 0–200)
HDL: 49.4 mg/dL (ref >39.00)
LDL CALC: 91 mg/dL (ref 0–99)
NonHDL: 114.6
TRIGLYCERIDES: 119 mg/dL (ref 0.0–149.0)
Total CHOL/HDL Ratio: 3
VLDL: 23.8 mg/dL (ref 0.0–40.0)

## 2014-06-14 LAB — PSA: PSA: 0.14 ng/mL (ref 0.10–4.00)

## 2014-06-21 ENCOUNTER — Ambulatory Visit (INDEPENDENT_AMBULATORY_CARE_PROVIDER_SITE_OTHER): Payer: 59 | Admitting: Family Medicine

## 2014-06-21 ENCOUNTER — Encounter: Payer: Self-pay | Admitting: Family Medicine

## 2014-06-21 VITALS — BP 124/78 | HR 56 | Temp 98.2°F | Ht 69.75 in | Wt 245.5 lb

## 2014-06-21 DIAGNOSIS — I350 Nonrheumatic aortic (valve) stenosis: Secondary | ICD-10-CM

## 2014-06-21 DIAGNOSIS — Z0000 Encounter for general adult medical examination without abnormal findings: Secondary | ICD-10-CM

## 2014-06-21 DIAGNOSIS — N400 Enlarged prostate without lower urinary tract symptoms: Secondary | ICD-10-CM

## 2014-06-21 DIAGNOSIS — R739 Hyperglycemia, unspecified: Secondary | ICD-10-CM

## 2014-06-21 DIAGNOSIS — L570 Actinic keratosis: Secondary | ICD-10-CM | POA: Insufficient documentation

## 2014-06-21 DIAGNOSIS — R740 Nonspecific elevation of levels of transaminase and lactic acid dehydrogenase [LDH]: Secondary | ICD-10-CM

## 2014-06-21 DIAGNOSIS — Z23 Encounter for immunization: Secondary | ICD-10-CM

## 2014-06-21 DIAGNOSIS — IMO0001 Reserved for inherently not codable concepts without codable children: Secondary | ICD-10-CM

## 2014-06-21 DIAGNOSIS — R7401 Elevation of levels of liver transaminase levels: Secondary | ICD-10-CM

## 2014-06-21 DIAGNOSIS — I10 Essential (primary) hypertension: Secondary | ICD-10-CM

## 2014-06-21 DIAGNOSIS — R011 Cardiac murmur, unspecified: Secondary | ICD-10-CM

## 2014-06-21 DIAGNOSIS — K219 Gastro-esophageal reflux disease without esophagitis: Secondary | ICD-10-CM

## 2014-06-21 DIAGNOSIS — E785 Hyperlipidemia, unspecified: Secondary | ICD-10-CM

## 2014-06-21 DIAGNOSIS — J4520 Mild intermittent asthma, uncomplicated: Secondary | ICD-10-CM

## 2014-06-21 HISTORY — DX: Nonrheumatic aortic (valve) stenosis: I35.0

## 2014-06-21 NOTE — Assessment & Plan Note (Signed)
Pt to return for treatment.

## 2014-06-21 NOTE — Assessment & Plan Note (Addendum)
Chronic, stable. Continue zocor.

## 2014-06-21 NOTE — Assessment & Plan Note (Signed)
Chronic, stable. Continue regimen. 

## 2014-06-21 NOTE — Assessment & Plan Note (Addendum)
Very mild, continue to monitor. Remote echo normal

## 2014-06-21 NOTE — Assessment & Plan Note (Signed)
Mild elevation remains - discussed ultrasound, pt opts to work on weight loss first.

## 2014-06-21 NOTE — Progress Notes (Signed)
BP 124/78 mmHg  Pulse 56  Temp(Src) 98.2 F (36.8 C) (Oral)  Ht 5' 9.75" (1.772 m)  Wt 245 lb 8 oz (111.358 kg)  BMI 35.46 kg/m2   CC: CPE  Subjective:    Patient ID: Erik Allen, male    DOB: 02/25/1955, 59 y.o.   MRN: [id number]  HPI: Erik Allen is a 59 y.o. male presenting on 06/21/2014 for Annual Exam   Preventative: Colonsocopy - 2010 WNL, rpt due 10 yrs. No BM changes, blood in stool Prostate - yearly screen, normal. Not strong stream but "good stream", nocturia x1-3. Known BPH on proscar. 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 Occasional scaly spots on face that don't resolve.   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 Interim medical history since our last visit reviewed. Allergies and medications reviewed and updated. Current Outpatient Prescriptions on File Prior to Visit  Medication Sig  . ADVAIR DISKUS 250-50 MCG/DOSE AEPB INHALE 1 DOSE BY MOUTH TWICE DAILY. RINSE MOUTH AFTER USE  . albuterol (PROAIR HFA) 108 (90 BASE) MCG/ACT inhaler Inhale 2 puffs into the lungs every 6 (six) hours as needed for wheezing.  Marland Kitchen dexlansoprazole (DEXILANT) 60 MG capsule Take 60 mg by mouth daily.  . finasteride (PROSCAR) 5 MG tablet TAKE 1 TABLET BY MOUTH EVERY DAY  . losartan-hydrochlorothiazide (HYZAAR) 100-12.5 MG per tablet Take 1 tablet by mouth daily.  . montelukast (SINGULAIR) 10 MG tablet TAKE 1 TABLET BY MOUTH AT BEDTIME  . simvastatin (ZOCOR) 80 MG tablet TAKE 1 TABLET BY MOUTH AT BEDTIME   No current facility-administered medications on file prior to visit.    Review  of Systems  Constitutional: Negative for fever, chills, activity change, appetite change, fatigue and unexpected weight change.  HENT: Negative for hearing loss.   Eyes: Negative for visual disturbance.  Respiratory: Positive for cough (coughing fits, unclear trigger) and shortness of breath. Negative for chest tightness and wheezing.   Cardiovascular: Positive for chest pain (chest wall spasms). Negative for palpitations and leg swelling.  Gastrointestinal: Negative for nausea, vomiting, abdominal pain, diarrhea, constipation, blood in stool and abdominal distention.  Genitourinary: Negative for hematuria and difficulty urinating.  Musculoskeletal: Negative for myalgias, arthralgias and neck pain.  Skin: Negative for rash.  Neurological: Positive for dizziness. Negative for seizures, syncope and headaches.  Hematological: Negative for adenopathy. Does not bruise/bleed easily.  Psychiatric/Behavioral: Negative for dysphoric mood. The patient is not nervous/anxious.    Per HPI unless specifically indicated above     Objective:    BP 124/78 mmHg  Pulse 56  Temp(Src) 98.2 F (36.8 C) (Oral)  Ht 5' 9.75" (1.772 m)  Wt 245 lb 8 oz (111.358 kg)  BMI 35.46 kg/m2  Wt Readings from Last 3 Encounters:  06/21/14 245 lb 8 oz (111.358 kg)  01/29/14 235 lb (106.595 kg)  12/21/13 241 lb 8 oz (109.544 kg)    Physical Exam  Constitutional: He is oriented to person, place, and time. He appears well-developed and well-nourished. No distress.  HENT:  Head: Normocephalic and atraumatic.  Right Ear: Hearing, tympanic membrane, external ear and ear canal normal.  Left Ear: Hearing, tympanic membrane,  external ear and ear canal normal.  Nose: Nose normal.  Mouth/Throat: Uvula is midline, oropharynx is clear and moist and mucous membranes are normal. No oropharyngeal exudate, posterior oropharyngeal edema or posterior oropharyngeal erythema.  Eyes: Conjunctivae and EOM are normal. Pupils are equal,  round, and reactive to light. No scleral icterus.  Neck: Normal range of motion. Neck supple. No thyromegaly present.  Cardiovascular: Normal rate, regular rhythm and intact distal pulses.   Murmur (1/6 SEM best at LUSB) heard. Pulses:      Radial pulses are 2+ on the right side, and 2+ on the left side.  Pulmonary/Chest: Effort normal and breath sounds normal. No respiratory distress. He has no wheezes. He has no rales.  Abdominal: Soft. Bowel sounds are normal. He exhibits no distension and no mass. There is no tenderness. There is no rebound and no guarding.  Genitourinary: Rectum normal and prostate normal. Rectal exam shows no external hemorrhoid, no internal hemorrhoid, no fissure, no mass, no tenderness and anal tone normal. Prostate is not enlarged (15gm) and not tender.  Musculoskeletal: Normal range of motion. He exhibits no edema.  Lymphadenopathy:    He has no cervical adenopathy.  Neurological: He is alert and oriented to person, place, and time.  CN grossly intact, station and gait intact  Skin: Skin is warm and dry. No rash noted.  AKs on face x4  Psychiatric: He has a normal mood and affect. His behavior is normal. Judgment and thought content normal.  Nursing note and vitals reviewed.  Results for orders placed or performed in visit on 06/14/14  Lipid panel  Result Value Ref Range   Cholesterol 164 0 - 200 mg/dL   Triglycerides 119.0 0.0 - 149.0 mg/dL   HDL 49.40 >39.00 mg/dL   VLDL 23.8 0.0 - 40.0 mg/dL   LDL Cholesterol 91 0 - 99 mg/dL   Total CHOL/HDL Ratio 3    NonHDL 114.60   Comprehensive metabolic panel  Result Value Ref Range   Sodium 139 135 - 145 mEq/L   Potassium 4.1 3.5 - 5.1 mEq/L   Chloride 102 96 - 112 mEq/L   CO2 29 19 - 32 mEq/L   Glucose, Bld 104 (H) 70 - 99 mg/dL   BUN 21 6 - 23 mg/dL   Creatinine, Ser 1.05 0.40 - 1.50 mg/dL   Total Bilirubin 0.4 0.2 - 1.2 mg/dL   Alkaline Phosphatase 54 39 - 117 U/L   AST 35 0 - 37 U/L   ALT 61 (H) 0 - 53  U/L   Total Protein 6.6 6.0 - 8.3 g/dL   Albumin 3.8 3.5 - 5.2 g/dL   Calcium 9.0 8.4 - 10.5 mg/dL   GFR 77.44 >60.00 mL/min  PSA  Result Value Ref Range   PSA 0.14 0.10 - 4.00 ng/mL      Assessment & Plan:   Problem List Items Addressed This Visit    Transaminitis    Mild elevation remains - discussed ultrasound, pt opts to work on weight loss first.      Systolic murmur    Very mild, continue to monitor. Remote echo normal      Obesity, Class II, BMI 35-39.9, with comorbidity    Discussed healthy diet and lifestyle changes to affect sustainable weight loss.  Body mass index is 35.46 kg/(m^2).      Hyperglycemia    Reviewed dx with patient, encouraged weight loss.      HLD (hyperlipidemia)    Chronic, stable. Continue zocor.  Healthcare maintenance - Primary    Preventative protocols reviewed and updated unless pt declined. Discussed healthy diet and lifestyle.       GERD    Great control on dexilant - continue.      Essential hypertension    Chronic, stable. Continue regimen.      BPH (benign prostatic hypertrophy)    Stable on proscar - continue.      Asthma    Chronic, stable. Continue daily advair and singulair.      Actinic keratosis    Pt to return for treatment.          Follow up plan: Return in about 1 year (around 06/21/2015), or as needed, for follow up visit.

## 2014-06-21 NOTE — Assessment & Plan Note (Addendum)
Chronic, stable. Continue daily advair and singulair.

## 2014-06-21 NOTE — Assessment & Plan Note (Signed)
Great control on dexilant - continue.

## 2014-06-21 NOTE — Progress Notes (Signed)
Pre visit review using our clinic review tool, if applicable. No additional management support is needed unless otherwise documented below in the visit note. 

## 2014-06-21 NOTE — Assessment & Plan Note (Addendum)
Discussed healthy diet and lifestyle changes to affect sustainable weight loss.  Body mass index is 35.46 kg/(m^2).

## 2014-06-21 NOTE — Assessment & Plan Note (Signed)
Reviewed dx with patient, encouraged weight loss.

## 2014-06-21 NOTE — Assessment & Plan Note (Signed)
Preventative protocols reviewed and updated unless pt declined. Discussed healthy diet and lifestyle.  

## 2014-06-21 NOTE — Addendum Note (Signed)
Addended by: Royann Shivers A on: 06/21/2014 09:08 AM   Modules accepted: Orders

## 2014-06-21 NOTE — Assessment & Plan Note (Signed)
Stable on proscar - continue.

## 2014-06-21 NOTE — Patient Instructions (Addendum)
Tdap today (tetanus and whooping cough). Work on weight loss to help sugar levels and liver function. Good to see you today, call us with quesitons. Return as needed or in 1 year for next physical. Return at your convenience to treat skin spots on face.  Fatty Liver Fatty liver is the accumulation of fat in liver cells. It is also called hepatosteatosis or steatohepatitis. It is normal for your liver to contain some fat. If fat is more than 5 to 10% of your liver's weight, you have fatty liver.  There are often no symptoms (problems) for years while damage is still occurring. People often learn about their fatty liver when they have medical tests for other reasons. Fat can damage your liver for years or even decades without causing problems. When it becomes severe, it can cause fatigue, weight loss, weakness, and confusion. This makes you more likely to develop more serious liver problems. The liver is the largest organ in the body. It does a lot of work and often gives no warning signs when it is sick until late in a disease. The liver has many important jobs including:  Breaking down foods.  Storing vitamins, iron, and other minerals.  Making proteins.  Making bile for food digestion.  Breaking down many products including medications, alcohol and some poisons. CAUSES  There are a number of different conditions, medications, and poisons that can cause a fatty liver. Eating too many calories causes fat to build up in the liver. Not processing and breaking fats down normally may also cause this. Certain conditions, such as obesity, diabetes, and high triglycerides also cause this. Most fatty liver patients tend to be middle-aged and over weight.  Some causes of fatty liver are:  Alcohol over consumption.  Malnutrition.  Steroid use.  Valproic acid toxicity.  Obesity.  Cushing's syndrome.  Poisons.  Tetracycline in high  dosages.  Pregnancy.  Diabetes.  Hyperlipidemia.  Rapid weight loss. Some people develop fatty liver even having none of these conditions. SYMPTOMS  Fatty liver most often causes no problems. This is called asymptomatic.  It can be diagnosed with blood tests and also by a liver biopsy.  It is one of the most common causes of minor elevations of liver enzymes on routine blood tests.  Specialized Imaging of the liver using ultrasound, CT (computed tomography) scan, or MRI (magnetic resonance imaging) can suggest a fatty liver but a biopsy is needed to confirm it.  A biopsy involves taking a small sample of liver tissue. This is done by using a needle. It is then looked at under a microscope by a specialist. TREATMENT  It is important to treat the cause. Simple fatty liver without a medical reason may not need treatment.  Weight loss, fat restriction, and exercise in overweight patients produces inconsistent results but is worth trying.  Fatty liver due to alcohol toxicity may not improve even with stopping drinking.  Good control of diabetes may reduce fatty liver.  Lower your triglycerides through diet, medication or both.  Eat a balanced, healthy diet.  Increase your physical activity.  Get regular checkups from a liver specialist.  There are no medical or surgical treatments for a fatty liver or NASH, but improving your diet and increasing your exercise may help prevent or reverse some of the damage. PROGNOSIS  Fatty liver may cause no damage or it can lead to an inflammation of the liver. This is, called steatohepatitis. When it is linked to alcohol abuse, it is called  alcoholic steatohepatitis. It often is not linked to alcohol. It is then called nonalcoholic steatohepatitis, or NASH. Over time the liver may become scarred and hardened. This condition is called cirrhosis. Cirrhosis is serious and may lead to liver failure or cancer. NASH is one of the leading causes of  cirrhosis. About 10-20% of Americans have fatty liver and a smaller 2-5% has NASH. Document Released: 03/16/2005 Document Revised: 04/23/2011 Document Reviewed: 06/10/2013 [facility] Patient Information 2015 [place], [place]. This information is not intended to replace advice given to you by your health care provider. Make sure you discuss any questions you have with your health care provider.

## 2014-06-25 ENCOUNTER — Ambulatory Visit (INDEPENDENT_AMBULATORY_CARE_PROVIDER_SITE_OTHER): Payer: 59 | Admitting: Family Medicine

## 2014-06-25 ENCOUNTER — Encounter: Payer: Self-pay | Admitting: Family Medicine

## 2014-06-25 VITALS — BP 144/84 | HR 80 | Temp 98.1°F | Wt 244.8 lb

## 2014-06-25 DIAGNOSIS — L570 Actinic keratosis: Secondary | ICD-10-CM | POA: Diagnosis not present

## 2014-06-25 NOTE — Patient Instructions (Signed)
Actinic Keratosis Actinic keratosis is a precancerous growth on the skin. This means it could develop into skin cancer if it is not treated. About 1% of actinic keratoses turn into skin cancer within a year. It is important to have all such growths removed to prevent them from developing into skin cancer. CAUSES  Actinic keratosis is caused by getting too much ultraviolet (UV) radiation from the sun or other UV light sources. RISK FACTORS Factors that increase your chances of getting actinic keratosis include:  Having light-colored skin and blue eyes.  Having blonde or red hair.  Spending a lot of time in the sun.  Age. The risk of actinic keratosis increases with age. SYMPTOMS  Actinic keratosis growths look like scaly, rough spots of skin. They can be as small as a pinhead or as big as a quarter. They may itch, hurt, or feel sensitive. Sometimes there is a little tag of pink or gray skin growing off them. In some cases, actinic keratoses are easier felt than seen. They do not go away with the use of moisturizing lotions or creams. Actinic keratoses appear most often on areas of skin that get a lot of sun exposure. These areas include the:  Scalp.  Face.  Ears.  Lips.  Upper back.  Backs of the hands.  Forearms. DIAGNOSIS  Your health care provider can usually tell what is wrong by performing a physical exam. A tissue sample (biopsy) may also be taken and examined under a microscope. TREATMENT  Actinic keratosis can be treated several ways. Most treatments can be done in your health care provider's office. Treatment options may include:  Curettage. A tool is used to gently scrape off the growth.  Cryosurgery. Liquid nitrogen is applied to the growth to freeze it. The growth eventually falls off the skin.  Medicated creams, such as 5-fluorouracil or imiquimod. The medicine destroys the cells in the growth.  Chemical peels. Chemicals are applied to the growth and the outer  layers of skin are peeled off.  Photodynamic therapy. A drug that makes your skin more sensitive to light is applied to the skin. A strong, blue light is aimed at the skin and destroys the growth. PREVENTION  To prevent future sun damage:  Try to avoid the sun between 10:00 a.m. and 4:00 p.m. when it is the strongest.  Use a sunscreen or sunblock with SPF 30 or greater.  Apply sunscreen at least 30 minutes before exposure to the sun.  Always wear protective hats, clothing, and sunglasses with UV protection.  Avoid medicines, herbs, and foods that increase your sensitivity to sunlight.  Avoid tanning beds. HOME CARE INSTRUCTIONS   If your skin was covered with a bandage, change and remove the bandage as directed by your health care provider.  Keep the treated area dry as directed by your health care provider.  Apply any creams as prescribed by your health care provider. Follow the directions carefully.  Check your skin regularly for any changes.  Visit a skin doctor (dermatologist) every year for a skin exam. SEEK MEDICAL CARE IF:   Your skin does not heal and becomes irritated, red, or bleeds.  You notice any changes or new growths on your skin. Document Released: 04/27/2008 Document Revised: 06/15/2013 Document Reviewed: 03/12/2011 [facility] Patient Information 2015 [place], [place]. This information is not intended to replace advice given to you by your health care provider. Make sure you discuss any questions you have with your health care provider.

## 2014-06-25 NOTE — Assessment & Plan Note (Addendum)
LN2 applied to skin lesions x6, 3 on each side of face. Anticipated blistering/peeling reaction discussed. Wound care discussed. RTC PRN or if signs of infection develop.

## 2014-06-25 NOTE — Progress Notes (Signed)
   BP 144/84 mmHg  Pulse 80  Temp(Src) 98.1 F (36.7 C) (Oral)  Wt 244 lb 12 oz (111.018 kg)   CC: AK freezing  Subjective:    Patient ID: Erik Allen, male    DOB: [birth year], 59 y.o.   MRN: [id number]  HPI: [name] is a 59 y.o. male presenting on 06/25/2014 for Skin Problem   Here to have several AKs frozen off of skin.   Relevant past medical, surgical, family and social history reviewed and updated as indicated. Interim medical history since our last visit reviewed. Allergies and medications reviewed and updated. Current Outpatient Prescriptions on File Prior to Visit  Medication Sig  . ADVAIR DISKUS 250-50 MCG/DOSE AEPB INHALE 1 DOSE BY MOUTH TWICE DAILY. RINSE MOUTH AFTER USE  . albuterol (PROAIR HFA) 108 (90 BASE) MCG/ACT inhaler Inhale 2 puffs into the lungs every 6 (six) hours as needed for wheezing.  Marland Kitchen dexlansoprazole (DEXILANT) 60 MG capsule Take 60 mg by mouth daily.  . finasteride (PROSCAR) 5 MG tablet TAKE 1 TABLET BY MOUTH EVERY DAY  . losartan-hydrochlorothiazide (HYZAAR) 100-12.5 MG per tablet Take 1 tablet by mouth daily.  . montelukast (SINGULAIR) 10 MG tablet TAKE 1 TABLET BY MOUTH AT BEDTIME  . simvastatin (ZOCOR) 80 MG tablet TAKE 1 TABLET BY MOUTH AT BEDTIME   No current facility-administered medications on file prior to visit.    Review of Systems Per HPI unless specifically indicated above     Objective:    BP 144/84 mmHg  Pulse 80  Temp(Src) 98.1 F (36.7 C) (Oral)  Wt 244 lb 12 oz (111.018 kg)  Wt Readings from Last 3 Encounters:  06/25/14 244 lb 12 oz (111.018 kg)  06/21/14 245 lb 8 oz (111.358 kg)  01/29/14 235 lb (106.595 kg)    Physical Exam  HENT:  Head:    Several rough spots on skin frozen off with LN2 therapy  Nursing note and vitals reviewed.  Liquid nitrogen was applied for 5 seconds to the skin lesions and the expected blistering or scabbing reaction explained. Do not pick at the area. Patient reminded to expect  hypopigmented scars from the procedure. Return if lesion fails to fully resolve.    Assessment & Plan:   Problem List Items Addressed This Visit    Actinic keratosis - Primary    LN2 applied to skin lesions x6, 3 on each side of face. Anticipated blistering/peeling reaction discussed. Wound care discussed. RTC PRN or if signs of infection develop.          Follow up plan: Return if symptoms worsen or fail to improve.

## 2014-06-25 NOTE — Progress Notes (Signed)
Pre visit review using our clinic review tool, if applicable. No additional management support is needed unless otherwise documented below in the visit note. 

## 2014-06-27 ENCOUNTER — Other Ambulatory Visit: Payer: Self-pay | Admitting: Family Medicine

## 2014-06-28 NOTE — Addendum Note (Signed)
Addended by: Ria Bush on: 06/28/2014 01:45 PM   Modules accepted: Level of Service

## 2014-06-28 NOTE — Telephone Encounter (Signed)
Rx sent to the pharmacy by e-script.//AB/CMA 

## 2014-07-04 ENCOUNTER — Other Ambulatory Visit: Payer: Self-pay | Admitting: Family Medicine

## 2014-07-05 ENCOUNTER — Other Ambulatory Visit: Payer: Self-pay | Admitting: Family Medicine

## 2014-11-01 ENCOUNTER — Other Ambulatory Visit: Payer: Self-pay | Admitting: Family Medicine

## 2014-11-21 ENCOUNTER — Other Ambulatory Visit: Payer: Self-pay | Admitting: Family Medicine

## 2014-12-07 ENCOUNTER — Other Ambulatory Visit: Payer: Self-pay | Admitting: Family Medicine

## 2015-01-16 ENCOUNTER — Other Ambulatory Visit: Payer: Self-pay | Admitting: Family Medicine

## 2015-01-24 ENCOUNTER — Encounter: Payer: Self-pay | Admitting: Family Medicine

## 2015-01-24 ENCOUNTER — Ambulatory Visit (INDEPENDENT_AMBULATORY_CARE_PROVIDER_SITE_OTHER): Payer: 59 | Admitting: Family Medicine

## 2015-01-24 VITALS — BP 148/90 | HR 106 | Temp 99.1°F | Wt 242.0 lb

## 2015-01-24 DIAGNOSIS — J4521 Mild intermittent asthma with (acute) exacerbation: Secondary | ICD-10-CM | POA: Diagnosis not present

## 2015-01-24 DIAGNOSIS — J209 Acute bronchitis, unspecified: Secondary | ICD-10-CM | POA: Diagnosis not present

## 2015-01-24 DIAGNOSIS — J019 Acute sinusitis, unspecified: Secondary | ICD-10-CM | POA: Diagnosis not present

## 2015-01-24 DIAGNOSIS — J45901 Unspecified asthma with (acute) exacerbation: Secondary | ICD-10-CM | POA: Insufficient documentation

## 2015-01-24 MED ORDER — IPRATROPIUM BROMIDE 0.02 % IN SOLN
0.5000 mg | Freq: Once | RESPIRATORY_TRACT | Status: AC
  Administered 2015-01-24: 0.5 mg via RESPIRATORY_TRACT

## 2015-01-24 MED ORDER — GUAIFENESIN-CODEINE 100-10 MG/5ML PO SYRP: 5.0000 mL | ORAL_SOLUTION | Freq: Two times a day (BID) | ORAL | Status: DC | PRN

## 2015-01-24 MED ORDER — AZITHROMYCIN 250 MG PO TABS: ORAL_TABLET | ORAL | Status: DC

## 2015-01-24 MED ORDER — PREDNISONE 20 MG PO TABS: ORAL_TABLET | ORAL | Status: DC

## 2015-01-24 MED ORDER — ALBUTEROL SULFATE (2.5 MG/3ML) 0.083% IN NEBU
2.5000 mg | INHALATION_SOLUTION | Freq: Once | RESPIRATORY_TRACT | Status: AC
  Administered 2015-01-24: 2.5 mg via RESPIRATORY_TRACT

## 2015-01-24 NOTE — Patient Instructions (Addendum)
Albuterol/atrovent neb today I do think you have bronchitis with sinusitis and asthma flare. Treat with zpack, prednisone, and codeine cough syrup for night time. Schedule albuterol 2 puffs every 4-6 hours for next 2 days. Push fluids and rest.  Good to see you today, let us know if not improving with treatment.

## 2015-01-24 NOTE — Progress Notes (Signed)
Pre visit review using our clinic review tool, if applicable. No additional management support is needed unless otherwise documented below in the visit note. 

## 2015-01-24 NOTE — Progress Notes (Signed)
BP 148/90 mmHg  Pulse 106  Temp(Src) 99.1 F (37.3 C) (Oral)  Wt 242 lb (109.77 kg)  SpO2 96%   CC: cough  Subjective:    Patient ID: Erik Allen, male    DOB: [birth year], 59 y.o.   MRN: [id number]  HPI: Erik Allen is a 59 y.o. male presenting on 01/24/2015 for Cough and Shortness of Breath   Several week h/o mild dry cough, acutely worse 2 days ago. Now more mucous production, sore from coughing, short winded, wheezy and rattly.+ fever/chills. Headache and congestion, ears stopped up. + coughing fits. Albuterol not helpful currently.   No ear or tooth pain.  Cancelled dental appointment this week because he was feeling ill.  No sick contacts at home. No smokers at home.   Ho GERD, asthma, allergic rhinitis. On advair, albuterol prn, singulair and dexilant. No albuterol today.  High doses codeine cause headache/dizziness. States he's done well with codeine cough syrup before.  Relevant past medical, surgical, family and social history reviewed and updated as indicated. Interim medical history since our last visit reviewed. Allergies and medications reviewed and updated. Current Outpatient Prescriptions on File Prior to Visit  Medication Sig  . ADVAIR DISKUS 250-50 MCG/DOSE AEPB INHALE 1 DOSE BY MOUTH TWICE DAILY. RINSE MOUTH AFTER USE  . albuterol (PROAIR HFA) 108 (90 BASE) MCG/ACT inhaler Inhale 2 puffs into the lungs every 6 (six) hours as needed for wheezing.  Marland Kitchen dexlansoprazole (DEXILANT) 60 MG capsule Take 60 mg by mouth daily.  . finasteride (PROSCAR) 5 MG tablet TAKE 1 TABLET BY MOUTH EVERY DAY  . losartan-hydrochlorothiazide (HYZAAR) 100-12.5 MG per tablet Take 1 tablet by mouth daily.  Marland Kitchen losartan-hydrochlorothiazide (HYZAAR) 100-12.5 MG tablet TAKE 1 TABLET BY MOUTH EVERY DAY  . montelukast (SINGULAIR) 10 MG tablet TAKE 1 TABLET BY MOUTH AT BEDTIME  . simvastatin (ZOCOR) 80 MG tablet TAKE 1 TABLET BY MOUTH AT BEDTIME   No current facility-administered  medications on file prior to visit.    Review of Systems Per HPI unless specifically indicated in ROS section     Objective:    BP 148/90 mmHg  Pulse 106  Temp(Src) 99.1 F (37.3 C) (Oral)  Wt 242 lb (109.77 kg)  SpO2 96%  Wt Readings from Last 3 Encounters:  01/24/15 242 lb (109.77 kg)  06/25/14 244 lb 12 oz (111.018 kg)  06/21/14 245 lb 8 oz (111.358 kg)    Physical Exam  Constitutional: He appears well-developed and well-nourished. No distress.  HENT:  Head: Normocephalic and atraumatic.  Right Ear: Hearing, external ear and ear canal normal.  Left Ear: Hearing, external ear and ear canal normal.  Nose: Mucosal edema present. No rhinorrhea. Right sinus exhibits maxillary sinus tenderness and frontal sinus tenderness. Left sinus exhibits maxillary sinus tenderness and frontal sinus tenderness.  Mouth/Throat: Uvula is midline and mucous membranes are normal. Posterior oropharyngeal edema and posterior oropharyngeal erythema present. No oropharyngeal exudate or tonsillar abscesses.  Congestion behind TMs R>L Nasal mucosal congestion/injection, purulent drainage L>R  Eyes: Conjunctivae and EOM are normal. Pupils are equal, round, and reactive to light. No scleral icterus.  Neck: Normal range of motion. Neck supple.  Cardiovascular: Normal rate, regular rhythm, normal heart sounds and intact distal pulses.   No murmur heard. Pulmonary/Chest: He is in respiratory distress. He has decreased breath sounds. He has wheezes (mild). He has rhonchi. He has no rales.  Tight airways throughout, coughing fits with deep breath Albuterol/atrovent neb today - improved air  movement, minimal wheezing, no rales.  Lymphadenopathy:    He has no cervical adenopathy.  Skin: Skin is warm and dry. No rash noted.  Nursing note and vitals reviewed.      Assessment & Plan:   Problem List Items Addressed This Visit    Asthma with acute exacerbation - Primary    Acute sinusitis/bronchitis leading  to asthma exacerbation. Improved air movement after alb/atrovent neb, decreased coughing fits.  Treat with zpack, prednisone, albuterol scheduled, codeine for night time. Update if no improvement with treatment. Pt agrees with plan. No evidence of PNA today so xray not obtained.       Relevant Medications   ipratropium (ATROVENT) nebulizer solution 0.5 mg (Completed)   albuterol (PROVENTIL) (2.5 MG/3ML) 0.083% nebulizer solution 2.5 mg (Completed)   predniSONE (DELTASONE) 20 MG tablet    Other Visit Diagnoses    Acute bronchitis, unspecified organism        Acute sinusitis, recurrence not specified, unspecified location        Relevant Medications    guaiFENesin-codeine (CHERATUSSIN AC) 100-10 MG/5ML syrup    azithromycin (ZITHROMAX) 250 MG tablet    predniSONE (DELTASONE) 20 MG tablet        Follow up plan: Return if symptoms worsen or fail to improve.

## 2015-01-24 NOTE — Assessment & Plan Note (Signed)
Acute sinusitis/bronchitis leading to asthma exacerbation. Improved air movement after alb/atrovent neb, decreased coughing fits.  Treat with zpack, prednisone, albuterol scheduled, codeine for night time. Update if no improvement with treatment. Pt agrees with plan. No evidence of PNA today so xray not obtained.

## 2015-01-31 ENCOUNTER — Ambulatory Visit (INDEPENDENT_AMBULATORY_CARE_PROVIDER_SITE_OTHER)
Admission: RE | Admit: 2015-01-31 | Discharge: 2015-01-31 | Disposition: A | Discharge status: Home / Self Care | Payer: 59 | Source: Ambulatory Visit | Attending: Family Medicine | Admitting: Family Medicine

## 2015-01-31 ENCOUNTER — Encounter: Payer: Self-pay | Admitting: Family Medicine

## 2015-01-31 ENCOUNTER — Ambulatory Visit (INDEPENDENT_AMBULATORY_CARE_PROVIDER_SITE_OTHER): Payer: 59 | Admitting: Family Medicine

## 2015-01-31 VITALS — BP 144/84 | HR 100 | Temp 97.9°F | Wt 246.5 lb

## 2015-01-31 DIAGNOSIS — J189 Pneumonia, unspecified organism: Secondary | ICD-10-CM

## 2015-01-31 DIAGNOSIS — R05 Cough: Secondary | ICD-10-CM | POA: Diagnosis not present

## 2015-01-31 DIAGNOSIS — J4521 Mild intermittent asthma with (acute) exacerbation: Secondary | ICD-10-CM | POA: Diagnosis not present

## 2015-01-31 DIAGNOSIS — R059 Cough, unspecified: Secondary | ICD-10-CM

## 2015-01-31 HISTORY — DX: Pneumonia, unspecified organism: J18.9

## 2015-01-31 MED ORDER — IPRATROPIUM BROMIDE 0.02 % IN SOLN
0.5000 mg | Freq: Once | RESPIRATORY_TRACT | Status: AC
Start: 2015-01-31 — End: 2015-01-31
  Administered 2015-01-31: 0.5 mg via RESPIRATORY_TRACT

## 2015-01-31 MED ORDER — ALBUTEROL SULFATE (2.5 MG/3ML) 0.083% IN NEBU
2.5000 mg | INHALATION_SOLUTION | Freq: Once | RESPIRATORY_TRACT | Status: AC
  Administered 2015-01-31: 2.5 mg via RESPIRATORY_TRACT

## 2015-01-31 MED ORDER — LEVOFLOXACIN 500 MG PO TABS: 500.0000 mg | ORAL_TABLET | Freq: Every day | ORAL | Status: DC

## 2015-01-31 MED ORDER — PREDNISONE 20 MG PO TABS: ORAL_TABLET | ORAL | Status: DC

## 2015-01-31 NOTE — Progress Notes (Signed)
Pre visit review using our clinic review tool, if applicable. No additional management support is needed unless otherwise documented below in the visit note. 

## 2015-01-31 NOTE — Patient Instructions (Addendum)
I am worried about pneumonia - treat with levaquin once daily for 7 days, another prednisone course, and schedule albuterol. Let me know if not improvement over next 2-3 days.  Community-Acquired Pneumonia, Adult Pneumonia is an infection of the lungs. There are different types of pneumonia. One type can develop while a person is in a hospital. A different type, called community-acquired pneumonia, develops in people who are not, or have not recently been, in the hospital or other health care facility.  CAUSES Pneumonia may be caused by bacteria, viruses, or funguses. Community-acquired pneumonia is often caused by Streptococcus pneumonia bacteria. These bacteria are often passed from one person to another by breathing in droplets from the cough or sneeze of an infected person. RISK FACTORS The condition is more likely to develop in:  People who havechronic diseases, such as chronic obstructive pulmonary disease (COPD), asthma, congestive heart failure, cystic fibrosis, diabetes, or kidney disease.  People who haveearly-stage or late-stage HIV.  People who havesickle cell disease.  People who havehad their spleen removed (splenectomy).  People who havepoor Human resources officer.  People who havemedical conditions that increase the risk of breathing in (aspirating) secretions their own mouth and nose.   People who havea weakened immune system (immunocompromised).  People who smoke.  People whotravel to areas where pneumonia-causing germs commonly exist.  People whoare around animal habitats or animals that have pneumonia-causing germs, including birds, bats, rabbits, cats, and farm animals. SYMPTOMS Symptoms of this condition include:  Adry cough.  A wet (productive) cough.  Fever.  Sweating.  Chest pain, especially when breathing deeply or coughing.  Rapid breathing or difficulty breathing.  Shortness of breath.  Shaking chills.  Fatigue.  Muscle  aches. DIAGNOSIS Your health care provider will take a medical history and perform a physical exam. You may also have other tests, including:  Imaging studies of your chest, including X-rays.  Tests to check your blood oxygen level and other blood gases.  Other tests on blood, mucus (sputum), fluid around your lungs (pleural fluid), and urine. If your pneumonia is severe, other tests may be done to identify the specific cause of your illness. TREATMENT The type of treatment that you receive depends on many factors, such as the cause of your pneumonia, the medicines you take, and other medical conditions that you have. For most adults, treatment and recovery from pneumonia may occur at home. In some cases, treatment must happen in a hospital. Treatment may include:  Antibiotic medicines, if the pneumonia was caused by bacteria.  Antiviral medicines, if the pneumonia was caused by a virus.  Medicines that are given by mouth or through an IV tube.  Oxygen.  Respiratory therapy. Although rare, treating severe pneumonia may include:  Mechanical ventilation. This is done if you are not breathing well on your own and you cannot maintain a safe blood oxygen level.  Thoracentesis. This procedureremoves fluid around one lung or both lungs to help you breathe better. HOME CARE INSTRUCTIONS  Take over-the-counter and prescription medicines only as told by your health care provider.  Only takecough medicine if you are losing sleep. Understand that cough medicine can prevent your body's natural ability to remove mucus from your lungs.  If you were prescribed an antibiotic medicine, take it as told by your health care provider. Do not stop taking the antibiotic even if you start to feel better.  Sleep in a semi-upright position at night. Try sleeping in a reclining chair, or place a few pillows under your  head.  Do not use tobacco products, including cigarettes, chewing tobacco, and  e-cigarettes. If you need help quitting, ask your health care provider.  Drink enough water to keep your urine clear or pale yellow. This will help to thin out mucus secretions in your lungs. PREVENTION There are ways that you can decrease your risk of developing community-acquired pneumonia. Consider getting a pneumococcal vaccine if:  You are older than 57 years of age.  You are older than 57 years of age and are undergoing cancer treatment, have chronic lung disease, or have other medical conditions that affect your immune system. Ask your health care provider if this applies to you. There are different types and schedules of pneumococcal vaccines. Ask your health care provider which vaccination option is best for you. You may also prevent community-acquired pneumonia if you take these actions:  Get an influenza vaccine every year. Ask your health care provider which type of influenza vaccine is best for you.  Go to the dentist on a regular basis.  Wash your hands often. Use hand sanitizer if soap and water are not available. SEEK MEDICAL CARE IF:  You have a fever.  You are losing sleep because you cannot control your cough with cough medicine. SEEK IMMEDIATE MEDICAL CARE IF:  You have worsening shortness of breath.  You have increased chest pain.  Your sickness becomes worse, especially if you are an older adult or have a weakened immune system.  You cough up blood.   This information is not intended to replace advice given to you by your health care provider. Make sure you discuss any questions you have with your health care provider.   Document Released: 01/29/2005 Document Revised: 10/20/2014 Document Reviewed: 05/26/2014 Elsevier Interactive Patient Education [employer].

## 2015-01-31 NOTE — Assessment & Plan Note (Signed)
Persistent - treated with albuterol/atrovent neb in office. Continue scheduled albuterol, extend prednisone course.

## 2015-01-31 NOTE — Progress Notes (Signed)
BP 144/84 mmHg  Pulse 100  Temp(Src) 97.9 F (36.6 C) (Oral)  Wt 246 lb 8 oz (111.812 kg)  SpO2 95%   CC: f/u visit  Subjective:    Patient ID: Erik Allen, male    DOB: 1955/10/18, 59 y.o.   MRN: [id number]  HPI: Erik Allen is a 59 y.o. male presenting on 01/31/2015 for Follow-up   See prior note for details. Seen here 01/24/2015 with dx acute bronchitis and sinusitis that led to asthma exacerbation. Treated with azithromycin, prednisone, scheduled albuterol, and codeine cough syrup. Also given albuterol/atrovent neb in office.   Some better, but still with dyspnea, sinus congestion with muffled hearing, and gurgling. Codeine cough syrup was not effective - still with trouble sleeping.   Continues scheduling albuterol.  Denies fevers/chills since zpack.   Relevant past medical, surgical, family and social history reviewed and updated as indicated. Interim medical history since our last visit reviewed. Allergies and medications reviewed and updated. Current Outpatient Prescriptions on File Prior to Visit  Medication Sig  . ADVAIR DISKUS 250-50 MCG/DOSE AEPB INHALE 1 DOSE BY MOUTH TWICE DAILY. RINSE MOUTH AFTER USE  . albuterol (PROAIR HFA) 108 (90 BASE) MCG/ACT inhaler Inhale 2 puffs into the lungs every 6 (six) hours as needed for wheezing.  Marland Kitchen dexlansoprazole (DEXILANT) 60 MG capsule Take 60 mg by mouth daily.  . finasteride (PROSCAR) 5 MG tablet TAKE 1 TABLET BY MOUTH EVERY DAY  . guaiFENesin-codeine (CHERATUSSIN AC) 100-10 MG/5ML syrup Take 5 mLs by mouth 2 (two) times daily as needed for cough (sedation precautions).  . losartan-hydrochlorothiazide (HYZAAR) 100-12.5 MG tablet TAKE 1 TABLET BY MOUTH EVERY DAY  . montelukast (SINGULAIR) 10 MG tablet TAKE 1 TABLET BY MOUTH AT BEDTIME  . simvastatin (ZOCOR) 80 MG tablet TAKE 1 TABLET BY MOUTH AT BEDTIME   No current facility-administered medications on file prior to visit.    Review of Systems Per HPI unless  specifically indicated in ROS section     Objective:    BP 144/84 mmHg  Pulse 100  Temp(Src) 97.9 F (36.6 C) (Oral)  Wt 246 lb 8 oz (111.812 kg)  SpO2 95%  Wt Readings from Last 3 Encounters:  01/31/15 246 lb 8 oz (111.812 kg)  01/24/15 242 lb (109.77 kg)  06/25/14 244 lb 12 oz (111.018 kg)    Physical Exam  Constitutional: He appears well-developed and well-nourished. No distress.  HENT:  Head: Normocephalic and atraumatic.  Right Ear: Hearing, external ear and ear canal normal.  Left Ear: Hearing, tympanic membrane, external ear and ear canal normal.  Nose: Mucosal edema present. No rhinorrhea. Right sinus exhibits frontal sinus tenderness. Right sinus exhibits no maxillary sinus tenderness. Left sinus exhibits frontal sinus tenderness. Left sinus exhibits no maxillary sinus tenderness.  Mouth/Throat: Uvula is midline, oropharynx is clear and moist and mucous membranes are normal. No oropharyngeal exudate, posterior oropharyngeal edema, posterior oropharyngeal erythema or tonsillar abscesses.  Fluid behind R ear  Eyes: Conjunctivae and EOM are normal. Pupils are equal, round, and reactive to light. No scleral icterus.  Neck: Normal range of motion. Neck supple.  Cardiovascular: Normal rate, regular rhythm, normal heart sounds and intact distal pulses.   No murmur heard. Pulmonary/Chest: Effort normal. No respiratory distress. He has decreased breath sounds. He has wheezes (faint). He has rhonchi. He has rales (bibasilar) in the left lower field.  Lymphadenopathy:    He has no cervical adenopathy.  Skin: Skin is warm and dry. No rash noted.  Nursing note and vitals reviewed.  Albuterol/atrovent neb: After treatment, improved air movement, persistent rhonchi/rales, minimal polysymphonic wheezing     Assessment & Plan:   Problem List Items Addressed This Visit    CAP (community acquired pneumonia) - Primary    Given no significant improvement after 1wk + lung exam findings  today, concern for CAP. Check CXR today - suspicion for LLL infiltrate - will treat with levaquin course today. Update if not improving over next 1/2 week. Pt agrees with plan.      Relevant Medications   albuterol (PROVENTIL) (2.5 MG/3ML) 0.083% nebulizer solution 2.5 mg (Completed)   ipratropium (ATROVENT) nebulizer solution 0.5 mg (Completed)   levofloxacin (LEVAQUIN) 500 MG tablet   Other Relevant Orders   DG Chest 2 View (Completed)   Asthma with acute exacerbation    Persistent - treated with albuterol/atrovent neb in office. Continue scheduled albuterol, extend prednisone course.      Relevant Medications   albuterol (PROVENTIL) (2.5 MG/3ML) 0.083% nebulizer solution 2.5 mg (Completed)   ipratropium (ATROVENT) nebulizer solution 0.5 mg (Completed)   predniSONE (DELTASONE) 20 MG tablet   Other Relevant Orders   DG Chest 2 View (Completed)       Follow up plan: Return if symptoms worsen or fail to improve.

## 2015-01-31 NOTE — Assessment & Plan Note (Addendum)
Given no significant improvement after 1wk + lung exam findings today, concern for CAP. Check CXR today - suspicion for LLL infiltrate - will treat with levaquin course today. Update if not improving over next 1/2 week. Pt agrees with plan.

## 2015-06-17 ENCOUNTER — Other Ambulatory Visit: Payer: Self-pay | Admitting: Family Medicine

## 2015-06-19 ENCOUNTER — Other Ambulatory Visit: Payer: Self-pay | Admitting: Family Medicine

## 2015-06-19 DIAGNOSIS — R7401 Elevation of levels of liver transaminase levels: Secondary | ICD-10-CM

## 2015-06-19 DIAGNOSIS — E559 Vitamin D deficiency, unspecified: Secondary | ICD-10-CM

## 2015-06-19 DIAGNOSIS — R740 Nonspecific elevation of levels of transaminase and lactic acid dehydrogenase [LDH]: Secondary | ICD-10-CM

## 2015-06-19 DIAGNOSIS — E785 Hyperlipidemia, unspecified: Secondary | ICD-10-CM

## 2015-06-19 DIAGNOSIS — N400 Enlarged prostate without lower urinary tract symptoms: Secondary | ICD-10-CM

## 2015-06-19 DIAGNOSIS — I10 Essential (primary) hypertension: Secondary | ICD-10-CM

## 2015-06-19 DIAGNOSIS — R739 Hyperglycemia, unspecified: Secondary | ICD-10-CM

## 2015-06-23 ENCOUNTER — Other Ambulatory Visit (INDEPENDENT_AMBULATORY_CARE_PROVIDER_SITE_OTHER): Payer: 59

## 2015-06-23 DIAGNOSIS — R740 Nonspecific elevation of levels of transaminase and lactic acid dehydrogenase [LDH]: Secondary | ICD-10-CM | POA: Diagnosis not present

## 2015-06-23 DIAGNOSIS — E785 Hyperlipidemia, unspecified: Secondary | ICD-10-CM

## 2015-06-23 DIAGNOSIS — E559 Vitamin D deficiency, unspecified: Secondary | ICD-10-CM

## 2015-06-23 DIAGNOSIS — N400 Enlarged prostate without lower urinary tract symptoms: Secondary | ICD-10-CM

## 2015-06-23 DIAGNOSIS — R739 Hyperglycemia, unspecified: Secondary | ICD-10-CM

## 2015-06-23 DIAGNOSIS — R7401 Elevation of levels of liver transaminase levels: Secondary | ICD-10-CM

## 2015-06-23 LAB — LIPID PANEL
CHOL/HDL RATIO: 3
Cholesterol: 161 mg/dL (ref 0–200)
HDL: 51.5 mg/dL (ref >39.00)
LDL CALC: 88 mg/dL (ref 0–99)
NonHDL: 109.09
TRIGLYCERIDES: 104 mg/dL (ref 0.0–149.0)
VLDL: 20.8 mg/dL (ref 0.0–40.0)

## 2015-06-23 LAB — COMPREHENSIVE METABOLIC PANEL
ALT: 69 U/L — ABNORMAL HIGH (ref 0–53)
AST: 45 U/L — AB (ref 0–37)
Albumin: 4.2 g/dL (ref 3.5–5.2)
Alkaline Phosphatase: 46 U/L (ref 39–117)
BUN: 20 mg/dL (ref 6–23)
CALCIUM: 9.2 mg/dL (ref 8.4–10.5)
CHLORIDE: 103 meq/L (ref 96–112)
CO2: 27 meq/L (ref 19–32)
CREATININE: 0.9 mg/dL (ref 0.40–1.50)
GFR: 92.18 mL/min (ref >60.00)
Glucose, Bld: 115 mg/dL — ABNORMAL HIGH (ref 70–99)
POTASSIUM: 4.3 meq/L (ref 3.5–5.1)
Sodium: 140 mEq/L (ref 135–145)
Total Bilirubin: 0.4 mg/dL (ref 0.2–1.2)
Total Protein: 6.7 g/dL (ref 6.0–8.3)

## 2015-06-23 LAB — HEMOGLOBIN A1C: HEMOGLOBIN A1C: 6.6 % — AB (ref 4.6–6.5)

## 2015-06-23 LAB — VITAMIN D 25 HYDROXY (VIT D DEFICIENCY, FRACTURES): VITD: 26.31 ng/mL — AB (ref 30.00–100.00)

## 2015-06-23 LAB — PSA: PSA: 0.1 ng/mL (ref 0.10–4.00)

## 2015-06-27 ENCOUNTER — Encounter: Payer: Self-pay | Admitting: Family Medicine

## 2015-06-27 ENCOUNTER — Ambulatory Visit (INDEPENDENT_AMBULATORY_CARE_PROVIDER_SITE_OTHER): Payer: 59 | Admitting: Family Medicine

## 2015-06-27 VITALS — BP 150/90 | HR 88 | Temp 98.2°F | Ht 69.75 in | Wt 249.8 lb

## 2015-06-27 DIAGNOSIS — N400 Enlarged prostate without lower urinary tract symptoms: Secondary | ICD-10-CM

## 2015-06-27 DIAGNOSIS — E559 Vitamin D deficiency, unspecified: Secondary | ICD-10-CM

## 2015-06-27 DIAGNOSIS — I10 Essential (primary) hypertension: Secondary | ICD-10-CM

## 2015-06-27 DIAGNOSIS — Z0000 Encounter for general adult medical examination without abnormal findings: Secondary | ICD-10-CM

## 2015-06-27 DIAGNOSIS — J4520 Mild intermittent asthma, uncomplicated: Secondary | ICD-10-CM

## 2015-06-27 DIAGNOSIS — R7401 Elevation of levels of liver transaminase levels: Secondary | ICD-10-CM

## 2015-06-27 DIAGNOSIS — L719 Rosacea, unspecified: Secondary | ICD-10-CM

## 2015-06-27 DIAGNOSIS — E119 Type 2 diabetes mellitus without complications: Secondary | ICD-10-CM

## 2015-06-27 DIAGNOSIS — R011 Cardiac murmur, unspecified: Secondary | ICD-10-CM

## 2015-06-27 DIAGNOSIS — E785 Hyperlipidemia, unspecified: Secondary | ICD-10-CM

## 2015-06-27 DIAGNOSIS — L570 Actinic keratosis: Secondary | ICD-10-CM

## 2015-06-27 DIAGNOSIS — IMO0001 Reserved for inherently not codable concepts without codable children: Secondary | ICD-10-CM

## 2015-06-27 DIAGNOSIS — R740 Nonspecific elevation of levels of transaminase and lactic acid dehydrogenase [LDH]: Secondary | ICD-10-CM

## 2015-06-27 MED ORDER — HYDROCHLOROTHIAZIDE 12.5 MG PO CAPS: 12.5000 mg | ORAL_CAPSULE | Freq: Every day | ORAL | Status: DC

## 2015-06-27 NOTE — Progress Notes (Signed)
Pre visit review using our clinic review tool, if applicable. No additional management support is needed unless otherwise documented below in the visit note. 

## 2015-06-27 NOTE — Assessment & Plan Note (Addendum)
Chronic, stable. Continue advair and singulair.

## 2015-06-27 NOTE — Assessment & Plan Note (Signed)
No significant enlargement on exam. Continue proscar. PSA reassuring.

## 2015-06-27 NOTE — Assessment & Plan Note (Signed)
S/p LN2 therapy last year. Pt requests derm referral this year. H/o rosacea as well, not under treatment currently.

## 2015-06-27 NOTE — Assessment & Plan Note (Signed)
Check abd [place]. Anticipate hyperglycemia contributing.

## 2015-06-27 NOTE — Assessment & Plan Note (Signed)
rec increase vit D to 2000 IU daily.  

## 2015-06-27 NOTE — Assessment & Plan Note (Addendum)
New diagnosis. Reviewed with patient as well as pathophysiology of type 2 diabetes. Pt declines DSME referral today, will pursue diabetes resources through work. Provided with diabetes and food handout.  RTC 4 mo f/u visit.

## 2015-06-27 NOTE — Patient Instructions (Addendum)
Work on incorporating exercise into your daily regimen.  Goal 150 min/wk moderate intensity aerobic exercise.  We will order liver ultrasound to further evaluate for fatty liver.  Return in 4 months for diabetes check.  Increase vitamin D.  Blood pressure staying elevated - start extra hctz 12.5mg  at night. Prior to next refill let us know and we will check dosing of blood pressure medicine.  A1c was in diet-controlled diabetes range - work on low sugar diet and weight loss.   Diabetes Mellitus and Food It is important for you to manage your blood sugar (glucose) level. Your blood glucose level can be greatly affected by what you eat. Eating healthier foods in the appropriate amounts throughout the day at about the same time each day will help you control your blood glucose level. It can also help slow or prevent worsening of your diabetes mellitus. Healthy eating may even help you improve the level of your blood pressure and reach or maintain a healthy weight.  General recommendations for healthful eating and cooking habits include:  Eating meals and snacks regularly. Avoid going long periods of time without eating to lose weight.  Eating a diet that consists mainly of plant-based foods, such as fruits, vegetables, nuts, legumes, and whole grains.  Using low-heat cooking methods, such as baking, instead of high-heat cooking methods, such as deep frying. Work with your dietitian to make sure you understand how to use the Nutrition Facts information on food labels. HOW CAN FOOD AFFECT ME? Carbohydrates Carbohydrates affect your blood glucose level more than any other type of food. Your dietitian will help you determine how many carbohydrates to eat at each meal and teach you how to count carbohydrates. Counting carbohydrates is important to keep your blood glucose at a healthy level, especially if you are using insulin or taking certain medicines for diabetes mellitus. Alcohol Alcohol can cause  sudden decreases in blood glucose (hypoglycemia), especially if you use insulin or take certain medicines for diabetes mellitus. Hypoglycemia can be a life-threatening condition. Symptoms of hypoglycemia (sleepiness, dizziness, and disorientation) are similar to symptoms of having too much alcohol.  If your health care provider has given you approval to drink alcohol, do so in moderation and use the following guidelines:  Women should not have more than one drink per day, and men should not have more than two drinks per day. One drink is equal to:  12 oz of beer.  5 oz of wine.  1 oz of hard liquor.  Do not drink on an empty stomach.  Keep yourself hydrated. Have water, diet soda, or unsweetened iced tea.  Regular soda, juice, and other mixers might contain a lot of carbohydrates and should be counted. WHAT FOODS ARE NOT RECOMMENDED? As you make food choices, it is important to remember that all foods are not the same. Some foods have fewer nutrients per serving than other foods, even though they might have the same number of calories or carbohydrates. It is difficult to get your body what it needs when you eat foods with fewer nutrients. Examples of foods that you should avoid that are high in calories and carbohydrates but low in nutrients include:  Trans fats (most processed foods list trans fats on the Nutrition Facts label).  Regular soda.  Juice.  Candy.  Sweets, such as cake, pie, doughnuts, and cookies.  Fried foods. WHAT FOODS CAN I EAT? Eat nutrient-rich foods, which will nourish your body and keep you healthy. The food you should  eat also will depend on several factors, including:  The calories you need.  The medicines you take.  Your weight.  Your blood glucose level.  Your blood pressure level.  Your cholesterol level. You should eat a variety of foods, including:  Protein.  Lean cuts of meat.  Proteins low in saturated fats, such as fish, egg whites,  and beans. Avoid processed meats.  Fruits and vegetables.  Fruits and vegetables that may help control blood glucose levels, such as apples, mangoes, and yams.  Dairy products.  Choose fat-free or low-fat dairy products, such as milk, yogurt, and cheese.  Grains, bread, pasta, and rice.  Choose whole grain products, such as multigrain bread, whole oats, and brown rice. These foods may help control blood pressure.  Fats.  Foods containing healthful fats, such as nuts, avocado, olive oil, canola oil, and fish. DOES EVERYONE WITH DIABETES MELLITUS HAVE THE SAME MEAL PLAN? Because every person with diabetes mellitus is different, there is not one meal plan that works for everyone. It is very important that you meet with a dietitian who will help you create a meal plan that is just right for you.   This information is not intended to replace advice given to you by your health care provider. Make sure you discuss any questions you have with your health care provider.   Document Released: 10/26/2004 Document Revised: 02/19/2014 Document Reviewed: 12/26/2012 Elsevier Interactive Patient Education [employer].

## 2015-06-27 NOTE — Assessment & Plan Note (Signed)
Chronic, uncontrolled. Just filled 90d supply of hyzaar. Will add hctz 12.5mg  nightly for 3 mo then increase combo pill dosing prior to next refill - pt will let us know when next refill due.

## 2015-06-27 NOTE — Assessment & Plan Note (Signed)
Discussed healthy diet and lifestyle changes to affect sustainable weight loss  

## 2015-06-27 NOTE — Assessment & Plan Note (Signed)
Preventative protocols reviewed and updated unless pt declined. Discussed healthy diet and lifestyle.  

## 2015-06-27 NOTE — Assessment & Plan Note (Signed)
Chronic, stable. Continue current regimen. 

## 2015-06-27 NOTE — Progress Notes (Signed)
BP 150/90 mmHg  Pulse 88  Temp(Src) 98.2 F (36.8 C) (Oral)  Ht 5' 9.75" (1.772 m)  Wt 249 lb 12 oz (113.286 kg)  BMI 36.08 kg/m2   CC: CPE  Subjective:    Patient ID: Erik Allen, male    DOB: 1955/09/18, 60 y.o.   MRN: [id number]  HPI: Erik Allen is a 60 y.o. male presenting on 06/27/2015 for Annual Exam   Increased stress at work - division being sold.   Preventative: Colonsocopy - 2010 WNL, rpt due 10 yrs. No BM changes, blood in stool Prostate - yearly screen, normal. Not strong stream but "good stream", nocturia x1-3. Known BPH on proscar. Flu shot - 11/2013 Pneumovax 2014 Td 1996, 2006, Tdap 2016 Seat belt use discussed Sunscreen use discussed, no suspicious moles. Occasional AKs on face. We did LN2 therapy 06/2014 to AKs on face - these resolved.   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 Interim medical history since our last visit reviewed. Allergies and medications reviewed and updated. Current Outpatient Prescriptions on File Prior to Visit  Medication Sig  . ADVAIR DISKUS 250-50 MCG/DOSE AEPB INHALE 1 DOSE BY MOUTH TWICE DAILY. RINSE MOUTH AFTER USE  . albuterol (PROAIR HFA) 108 (90 BASE) MCG/ACT inhaler Inhale 2 puffs into the lungs every 6 (six) hours as needed for wheezing.  Marland Kitchen dexlansoprazole (DEXILANT) 60 MG capsule Take 60 mg by mouth daily.  . finasteride (PROSCAR) 5 MG tablet TAKE 1 TABLET BY MOUTH EVERY DAY  . losartan-hydrochlorothiazide (HYZAAR) 100-12.5 MG tablet TAKE 1 TABLET BY MOUTH EVERY DAY  . montelukast (SINGULAIR) 10 MG tablet TAKE 1 TABLET BY MOUTH AT BEDTIME  . simvastatin (ZOCOR) 80 MG tablet TAKE 1 TABLET BY MOUTH AT BEDTIME   No  current facility-administered medications on file prior to visit.    Review of Systems  Constitutional: Negative for fever, chills, activity change, appetite change, fatigue and unexpected weight change.  HENT: Negative for hearing loss.   Eyes: Negative for visual disturbance.  Respiratory: Positive for cough (ongoing - known asthmatic). Negative for chest tightness, shortness of breath and wheezing.   Cardiovascular: Negative for chest pain, palpitations and leg swelling.  Gastrointestinal: Negative for nausea, vomiting, abdominal pain, diarrhea, constipation, blood in stool and abdominal distention.  Genitourinary: Negative for hematuria and difficulty urinating.  Musculoskeletal: Negative for myalgias, arthralgias and neck pain.  Skin: Negative for rash.  Neurological: Negative for dizziness, seizures, syncope and headaches.  Hematological: Negative for adenopathy. Does not bruise/bleed easily.  Psychiatric/Behavioral: Negative for dysphoric mood. The patient is not nervous/anxious.    Per HPI unless specifically indicated in ROS section     Objective:    BP 150/90 mmHg  Pulse 88  Temp(Src) 98.2 F (36.8 C) (Oral)  Ht 5' 9.75" (1.772 m)  Wt 249 lb 12 oz (113.286 kg)  BMI 36.08 kg/m2  Wt Readings from Last 3 Encounters:  06/27/15 249 lb 12 oz (113.286 kg)  01/31/15 246 lb 8 oz (111.812 kg)  01/24/15 242 lb (109.77 kg)    Physical Exam  Constitutional: He is oriented to person, place, and time. He appears well-developed and well-nourished. No distress.  HENT:  Head: Normocephalic and atraumatic.  Right Ear: Hearing, tympanic  membrane, external ear and ear canal normal.  Left Ear: Hearing, tympanic membrane, external ear and ear canal normal.  Nose: Nose normal.  Mouth/Throat: Uvula is midline, oropharynx is clear and moist and mucous membranes are normal. No oropharyngeal exudate, posterior oropharyngeal edema or posterior oropharyngeal erythema.  Eyes: Conjunctivae and  EOM are normal. Pupils are equal, round, and reactive to light. No scleral icterus.  Neck: Normal range of motion. Neck supple. No thyromegaly present.  Cardiovascular: Normal rate, regular rhythm and intact distal pulses.   Murmur (2/6 SEM best at RUSB) heard. Pulses:      Radial pulses are 2+ on the right side, and 2+ on the left side.  Pulmonary/Chest: Effort normal and breath sounds normal. No respiratory distress. He has no wheezes. He has no rales.  Abdominal: Soft. Bowel sounds are normal. He exhibits no distension and no mass. There is no tenderness. There is no rebound and no guarding.  Genitourinary: Rectum normal and prostate normal. Rectal exam shows no external hemorrhoid, no internal hemorrhoid, no fissure, no mass, no tenderness and anal tone normal. Prostate is not enlarged (10gm) and not tender.  Musculoskeletal: Normal range of motion. He exhibits no edema.  Lymphadenopathy:    He has no cervical adenopathy.  Neurological: He is alert and oriented to person, place, and time.  CN grossly intact, station and gait intact  Skin: Skin is warm and dry. No rash noted.  AKs on skin on face Nasal telangectasia  Psychiatric: He has a normal mood and affect. His behavior is normal. Judgment and thought content normal.  Nursing note and vitals reviewed.  Results for orders placed or performed in visit on 06/23/15  Lipid panel  Result Value Ref Range   Cholesterol 161 0 - 200 mg/dL   Triglycerides 104.0 0.0 - 149.0 mg/dL   HDL 51.50 >39.00 mg/dL   VLDL 20.8 0.0 - 40.0 mg/dL   LDL Cholesterol 88 0 - 99 mg/dL   Total CHOL/HDL Ratio 3    NonHDL 109.09   Comprehensive metabolic panel  Result Value Ref Range   Sodium 140 135 - 145 mEq/L   Potassium 4.3 3.5 - 5.1 mEq/L   Chloride 103 96 - 112 mEq/L   CO2 27 19 - 32 mEq/L   Glucose, Bld 115 (H) 70 - 99 mg/dL   BUN 20 6 - 23 mg/dL   Creatinine, Ser 0.90 0.40 - 1.50 mg/dL   Total Bilirubin 0.4 0.2 - 1.2 mg/dL   Alkaline  Phosphatase 46 39 - 117 U/L   AST 45 (H) 0 - 37 U/L   ALT 69 (H) 0 - 53 U/L   Total Protein 6.7 6.0 - 8.3 g/dL   Albumin 4.2 3.5 - 5.2 g/dL   Calcium 9.2 8.4 - 10.5 mg/dL   GFR 92.18 >60.00 mL/min  PSA  Result Value Ref Range   PSA 0.10 0.10 - 4.00 ng/mL  Hemoglobin A1c  Result Value Ref Range   Hgb A1c MFr Bld 6.6 (H) 4.6 - 6.5 %  VITAMIN D 25 Hydroxy (Vit-D Deficiency, Fractures)  Result Value Ref Range   VITD 26.31 (L) 30.00 - 100.00 ng/mL      Assessment & Plan:   Problem List Items Addressed This Visit    Vitamin D deficiency    rec increase vit D to 2000 IU daily.      HLD (hyperlipidemia)    Chronic, stable. Continue current regimen.      Relevant Medications   hydrochlorothiazide (MICROZIDE) 12.5  MG capsule   Essential hypertension    Chronic, uncontrolled. Just filled 90d supply of hyzaar. Will add hctz 12.5mg  nightly for 3 mo then increase combo pill dosing prior to next refill - pt will let us know when next refill due.      Relevant Medications   hydrochlorothiazide (MICROZIDE) 12.5 MG capsule   Asthma    Chronic, stable. Continue advair and singulair.      BPH (benign prostatic hypertrophy)    No significant enlargement on exam. Continue proscar. PSA reassuring.      Rosacea   Relevant Orders   Ambulatory referral to Dermatology   Diet-controlled diabetes mellitus ([place])    New diagnosis. Reviewed with patient as well as pathophysiology of type 2 diabetes. Pt declines DSME referral today, will pursue diabetes resources through work. Provided with diabetes and food handout.  RTC 4 mo f/u visit.       Healthcare maintenance - Primary    Preventative protocols reviewed and updated unless pt declined. Discussed healthy diet and lifestyle.       Obesity, Class II, BMI 35-39.9, with comorbidity (HCC)    Discussed healthy diet and lifestyle changes to affect sustainable weight loss      Transaminitis    Check abd [place]. Anticipate hyperglycemia  contributing.       Relevant Orders   US Abdomen Limited RUQ   Systolic murmur    Mild.       Actinic keratosis    S/p LN2 therapy last year. Pt requests derm referral this year. H/o rosacea as well, not under treatment currently.      Relevant Orders   Ambulatory referral to Dermatology       Follow up plan: Return in about 4 months (around 10/28/2015), or as needed, for follow up visit.  Ria Bush, MD

## 2015-06-27 NOTE — Assessment & Plan Note (Signed)
Mild.

## 2015-07-08 ENCOUNTER — Ambulatory Visit
Admission: RE | Admit: 2015-07-08 | Discharge: 2015-07-08 | Disposition: A | Discharge status: Home / Self Care | Payer: 59 | Source: Ambulatory Visit | Attending: Family Medicine | Admitting: Family Medicine

## 2015-07-08 DIAGNOSIS — R740 Nonspecific elevation of levels of transaminase and lactic acid dehydrogenase [LDH]: Principal | ICD-10-CM

## 2015-07-08 DIAGNOSIS — R7401 Elevation of levels of liver transaminase levels: Secondary | ICD-10-CM

## 2015-07-14 ENCOUNTER — Encounter: Payer: Self-pay | Admitting: Family Medicine

## 2015-07-20 ENCOUNTER — Other Ambulatory Visit: Payer: Self-pay | Admitting: Family Medicine

## 2015-09-12 ENCOUNTER — Telehealth: Payer: Self-pay | Admitting: Family Medicine

## 2015-09-12 MED ORDER — ALBUTEROL SULFATE HFA 108 (90 BASE) MCG/ACT IN AERS: 2.0000 | INHALATION_SPRAY | Freq: Four times a day (QID) | RESPIRATORY_TRACT | 11 refills | Status: DC | PRN

## 2015-09-12 MED ORDER — LOSARTAN POTASSIUM-HCTZ 100-25 MG PO TABS: 1.0000 | ORAL_TABLET | Freq: Every day | ORAL | 3 refills | Status: DC

## 2015-09-12 NOTE — Telephone Encounter (Signed)
Sent new meds into pharmacy.

## 2015-09-12 NOTE — Telephone Encounter (Signed)
Pt is at the end of his 90 days of his bp medication.  Pt states Dr. Danise Mina wanted to write a new rx for a new medication for his bp.  He also said he needs a new rx for his rescue inhaler.

## 2015-09-29 ENCOUNTER — Other Ambulatory Visit: Payer: Self-pay | Admitting: Family Medicine

## 2015-10-14 ENCOUNTER — Other Ambulatory Visit: Payer: Self-pay | Admitting: Family Medicine

## 2015-10-31 ENCOUNTER — Encounter: Payer: Self-pay | Admitting: Family Medicine

## 2015-10-31 ENCOUNTER — Ambulatory Visit (INDEPENDENT_AMBULATORY_CARE_PROVIDER_SITE_OTHER): Payer: 59 | Admitting: Family Medicine

## 2015-10-31 VITALS — BP 134/82 | HR 80 | Temp 98.1°F | Wt 241.5 lb

## 2015-10-31 DIAGNOSIS — E119 Type 2 diabetes mellitus without complications: Secondary | ICD-10-CM

## 2015-10-31 DIAGNOSIS — I10 Essential (primary) hypertension: Secondary | ICD-10-CM | POA: Diagnosis not present

## 2015-10-31 DIAGNOSIS — IMO0001 Reserved for inherently not codable concepts without codable children: Secondary | ICD-10-CM

## 2015-10-31 DIAGNOSIS — B353 Tinea pedis: Secondary | ICD-10-CM | POA: Insufficient documentation

## 2015-10-31 DIAGNOSIS — Z23 Encounter for immunization: Secondary | ICD-10-CM

## 2015-10-31 LAB — BASIC METABOLIC PANEL
BUN: 16 mg/dL (ref 6–23)
CALCIUM: 8.9 mg/dL (ref 8.4–10.5)
CO2: 30 meq/L (ref 19–32)
CREATININE: 0.92 mg/dL (ref 0.40–1.50)
Chloride: 105 mEq/L (ref 96–112)
GFR: 89.76 mL/min (ref >60.00)
Glucose, Bld: 111 mg/dL — ABNORMAL HIGH (ref 70–99)
Potassium: 4 mEq/L (ref 3.5–5.1)
SODIUM: 140 meq/L (ref 135–145)

## 2015-10-31 LAB — HEMOGLOBIN A1C: HEMOGLOBIN A1C: 6.4 % (ref 4.6–6.5)

## 2015-10-31 NOTE — Assessment & Plan Note (Addendum)
Chronic, anticipate well controlled per pt report of healthy diet changes. Update A1c.

## 2015-10-31 NOTE — Patient Instructions (Addendum)
Flu shot today. Labs today. Try OTC clotrimazole cream daily for athlete's foot. You are doing well today, return as needed or in 4 months for follow up visit.

## 2015-10-31 NOTE — Progress Notes (Signed)
BP 134/82   Pulse 80   Temp 98.1 F (36.7 C) (Oral)   Wt 241 lb 8 oz (109.5 kg)   BMI 34.90 kg/m    CC: 3 mo f/u visit Subjective:    Patient ID: Erik Allen, male    DOB: Jun 18, 1955, 60 y.o.   MRN: [id number]  HPI: Erik Allen is a 60 y.o. male presenting on 10/31/2015 for Follow-up   HTN - Compliant with current antihypertensive regimen of hyzaar 100/25 daily. Does check blood pressures at home: good readings. No low blood pressure readings or symptoms of dizziness/syncope. Denies HA, vision changes, CP/tightness, SOB, leg swelling.   DM - new diagnosis last visit. Pt was to pursue diabetes resources through work. Regularly does not check sugars. Compliant with antihyperglycemic regimen which includes: diet controlled. Denies low sugars or hypoglycemic symptoms.  Denies paresthesias.Last diabetic eye exam DUE.  Pneumovax: 01/2013.  Prevnar: not due yet. Lab Results  Component Value Date   HGBA1C 6.6 (H) 06/23/2015   Diabetic Foot Exam - Simple   Simple Foot Form Diabetic Foot exam was performed with the following findings:  Yes 10/31/2015  8:17 AM  Visual Inspection No deformities, no ulcerations, no other skin breakdown bilaterally:  Yes See comments:  Yes Sensation Testing Intact to touch and monofilament testing bilaterally:  Yes Pulse Check Posterior Tibialis and Dorsalis pulse intact bilaterally:  Yes Comments Mildly erythematous scaling of bilateral soles, pruritic.      Walking daily. Backing off sugars. 7lb weight loss.   Relevant past medical, surgical, family and social history reviewed and updated as indicated. Interim medical history since our last visit reviewed. Allergies and medications reviewed and updated. Current Outpatient Prescriptions on File Prior to Visit  Medication Sig  . ADVAIR DISKUS 250-50 MCG/DOSE AEPB INHALE 1 DOSE BY MOUTH TWICE DAILY. RINSE MOUTH AFTER USE  . albuterol (PROAIR HFA) 108 (90 Base) MCG/ACT inhaler Inhale 2 puffs  into the lungs every 6 (six) hours as needed for wheezing.  . cholecalciferol (VITAMIN D) 1000 units tablet Take 2,000 Units by mouth daily.  Marland Kitchen dexlansoprazole (DEXILANT) 60 MG capsule Take 60 mg by mouth daily.  . finasteride (PROSCAR) 5 MG tablet TAKE 1 TABLET BY MOUTH EVERY DAY  . losartan-hydrochlorothiazide (HYZAAR) 100-25 MG tablet Take 1 tablet by mouth daily.  . montelukast (SINGULAIR) 10 MG tablet TAKE 1 TABLET BY MOUTH AT BEDTIME  . simvastatin (ZOCOR) 80 MG tablet TAKE 1 TABLET BY MOUTH AT BEDTIME   No current facility-administered medications on file prior to visit.     Review of Systems Per HPI unless specifically indicated in ROS section     Objective:    BP 134/82   Pulse 80   Temp 98.1 F (36.7 C) (Oral)   Wt 241 lb 8 oz (109.5 kg)   BMI 34.90 kg/m   Wt Readings from Last 3 Encounters:  10/31/15 241 lb 8 oz (109.5 kg)  06/27/15 249 lb 12 oz (113.3 kg)  01/31/15 246 lb 8 oz (111.8 kg)    Physical Exam  Constitutional: He appears well-developed and well-nourished. No distress.  HENT:  Head: Normocephalic and atraumatic.  Nose: Nose normal.  Mouth/Throat: Oropharynx is clear and moist. No oropharyngeal exudate.  Eyes: Conjunctivae and EOM are normal. Pupils are equal, round, and reactive to light. No scleral icterus.  Neck: Normal range of motion. Neck supple.  Cardiovascular: Normal rate, regular rhythm and intact distal pulses.   Murmur (3/6 systolic) heard. Pulmonary/Chest: Effort normal  and breath sounds normal. No respiratory distress. He has no wheezes. He has no rales.  Musculoskeletal: He exhibits no edema.  See HPI for foot exam if done  Lymphadenopathy:    He has no cervical adenopathy.  Skin: Skin is warm and dry. No rash noted.  Psychiatric: He has a normal mood and affect.  Nursing note and vitals reviewed.  Results for orders placed or performed in visit on 06/23/15  Lipid panel  Result Value Ref Range   Cholesterol 161 0 - 200 mg/dL    Triglycerides 104.0 0.0 - 149.0 mg/dL   HDL 51.50 >39.00 mg/dL   VLDL 20.8 0.0 - 40.0 mg/dL   LDL Cholesterol 88 0 - 99 mg/dL   Total CHOL/HDL Ratio 3    NonHDL 109.09   Comprehensive metabolic panel  Result Value Ref Range   Sodium 140 135 - 145 mEq/L   Potassium 4.3 3.5 - 5.1 mEq/L   Chloride 103 96 - 112 mEq/L   CO2 27 19 - 32 mEq/L   Glucose, Bld 115 (H) 70 - 99 mg/dL   BUN 20 6 - 23 mg/dL   Creatinine, Ser 0.90 0.40 - 1.50 mg/dL   Total Bilirubin 0.4 0.2 - 1.2 mg/dL   Alkaline Phosphatase 46 39 - 117 U/L   AST 45 (H) 0 - 37 U/L   ALT 69 (H) 0 - 53 U/L   Total Protein 6.7 6.0 - 8.3 g/dL   Albumin 4.2 3.5 - 5.2 g/dL   Calcium 9.2 8.4 - 10.5 mg/dL   GFR 92.18 >60.00 mL/min  PSA  Result Value Ref Range   PSA 0.10 0.10 - 4.00 ng/mL  Hemoglobin A1c  Result Value Ref Range   Hgb A1c MFr Bld 6.6 (H) 4.6 - 6.5 %  VITAMIN D 25 Hydroxy (Vit-D Deficiency, Fractures)  Result Value Ref Range   VITD 26.31 (L) 30.00 - 100.00 ng/mL      Assessment & Plan:   Problem List Items Addressed This Visit    Diet-controlled diabetes mellitus ([place]) - Primary    Chronic, anticipate well controlled per pt report of healthy diet changes. Update A1c.      Relevant Orders   Hemoglobin 123456   Basic metabolic panel   Essential hypertension    Chronic, stable. Continue current regimen.       Obesity, Class II, BMI 35-39.9, with comorbidity ([place])    Congratulated on weight loss to date. Continue regular walking.       Tinea pedis    rec try clotrimazole daily. Update with effect. Consider oral antifungal. Caution with h/o fatty liver.       Other Visit Diagnoses    Need for influenza vaccination       Relevant Orders   Flu Vaccine QUAD 36+ mos PF IM (Fluarix & Fluzone Quad PF)       Follow up plan: Return in about 4 months (around 03/01/2016) for follow up visit.  Ria Bush, MD

## 2015-10-31 NOTE — Progress Notes (Signed)
Pre visit review using our clinic review tool, if applicable. No additional management support is needed unless otherwise documented below in the visit note. 

## 2015-10-31 NOTE — Assessment & Plan Note (Addendum)
rec try clotrimazole daily. Update with effect. Consider oral antifungal. Caution with h/o fatty liver.

## 2015-10-31 NOTE — Assessment & Plan Note (Signed)
Chronic, stable. Continue current regimen. 

## 2015-10-31 NOTE — Assessment & Plan Note (Signed)
Congratulated on weight loss to date. Continue regular walking.

## 2015-11-01 ENCOUNTER — Encounter: Payer: Self-pay | Admitting: *Deleted

## 2015-12-11 ENCOUNTER — Other Ambulatory Visit: Payer: Self-pay | Admitting: Family Medicine

## 2016-01-16 ENCOUNTER — Encounter: Payer: Self-pay | Admitting: Internal Medicine

## 2016-01-16 ENCOUNTER — Ambulatory Visit (INDEPENDENT_AMBULATORY_CARE_PROVIDER_SITE_OTHER): Payer: 59 | Admitting: Internal Medicine

## 2016-01-16 VITALS — BP 140/82 | HR 104 | Temp 99.5°F | Wt 238.0 lb

## 2016-01-16 DIAGNOSIS — R509 Fever, unspecified: Secondary | ICD-10-CM | POA: Diagnosis not present

## 2016-01-16 DIAGNOSIS — J4541 Moderate persistent asthma with (acute) exacerbation: Secondary | ICD-10-CM | POA: Diagnosis not present

## 2016-01-16 LAB — POC INFLUENZA A&B (BINAX/QUICKVUE)
INFLUENZA A, POC: NEGATIVE
INFLUENZA B, POC: NEGATIVE

## 2016-01-16 MED ORDER — ALBUTEROL SULFATE (2.5 MG/3ML) 0.083% IN NEBU
2.5000 mg | INHALATION_SOLUTION | Freq: Once | RESPIRATORY_TRACT | Status: AC
  Administered 2016-01-16: 2.5 mg via RESPIRATORY_TRACT

## 2016-01-16 MED ORDER — IPRATROPIUM BROMIDE 0.02 % IN SOLN
0.5000 mg | Freq: Once | RESPIRATORY_TRACT | Status: AC
  Administered 2016-01-16: 0.5 mg via RESPIRATORY_TRACT

## 2016-01-16 MED ORDER — PREDNISONE 10 MG PO TABS: ORAL_TABLET | ORAL | 0 refills | Status: DC

## 2016-01-16 NOTE — Progress Notes (Signed)
HPI  Pt presents to the clinic today with c/o cough, chest congestion and shortness of breath. He reports this started 4 days ago. The cough is non productive. He gets short of breath with "coughing fits", no other time. He denies runny nose, nasal congestion, ear fullness or sore throat. He has run fever up to 100.7, had chills and body aches. He has tried Mucinex, Singulair, Advair and Albuterol without any relief. He has a history of asthma and is UTD on his flu and pneumonia vaccines. He has not had sick contacts that he is aware of.  Review of Systems        Past Medical History:  Diagnosis Date  . Asthma 09/13/99  . BENIGN PROSTATIC HYPERTROPHY 05/14/2003  . CAP (community acquired pneumonia) 01/31/2015  . Disorder of vocal cord 2012   h/o thrush on vocal cords per patient  . GERD (gastroesophageal reflux disease) 10/13/01   ENT eval for GERD and thrush 2011  . History of kidney stones   . HTN (hypertension)   . Hyperlipemia 10/13/97  . Seasonal allergic rhinitis    worse in spring  . Systolic murmur   . Transaminitis 2014   presumed fatty liver without [place], normal iron and viral hep panel    Family History  Problem Relation Age of Onset  . Hypertension Mother   . Cancer Father 74    Lymphoma, recurrent  . Hypertension Father   . Hypertension Brother     Social History   Social History  . Marital status: Single    Spouse name: N/A  . Number of children: N/A  . Years of education: N/A   Occupational History  . 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 Smoking status: Never Smoker  . Smokeless tobacco: Never Used  . Alcohol use 0.5 - 1.0 oz/week    1 - 2 drink(s) per week     Comment: occassionally  . Drug use: No  . Sexual activity: Yes   Other Topics Concern  . 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 Ace Inhibitors Cough  . Codeine     REACTION: DIZZY / HEADACHE  . Tramadol     REACTION: NAUSEA/ VOMITING     Constitutional: Positive fatigue and fever. Denies headache. abrupt weight changes.  HEENT:  Denies eye redness, eye pain, pressure behind the eyes, facial pain, nasal congestion, ear pain, ringing in the ears, wax buildup, runny nose or bloody nose. Respiratory: Positive cough and shortness of breath. Denies difficulty breathing Cardiovascular: Denies chest pain, chest tightness, palpitations or swelling in the hands or feet.   No other specific complaints in a complete review of systems (except as listed in HPI above).  Objective:   BP 140/82   Pulse (!) 104   Temp 99.5 F (37.5 C) (Oral)   Wt 238 lb (108 kg)   SpO2 95%   BMI 34.39 kg/m  Wt Readings from Last 3 Encounters:  01/16/16 238 lb (108 kg)  10/31/15 241 lb 8 oz (109.5 kg)  06/27/15 249 lb 12 oz (113.3 kg)     General: Appears his stated  age, ill appearing in NAD. HEENT: Head: normal shape and size, no sinus tenderness noted; Eyes: sclera white, no icterus, conjunctiva pink; Ears: Tm's gray and intact, normal light reflex; Nose: mucosa pink and moist, septum midline; Throat/Mouth: + PND. Teeth present, mucosa erythematous and moist, no exudate noted, no lesions or ulcerations noted.  Neck: No cervical lymphadenopathy.  Pulmonary/Chest: Normal effort and bilateral expiratory wheezing noted. No respiratory distress. No  rales or ronchi noted.       Assessment & Plan:   Asthma Exacerbation:  Rapid Flu: negative Get some rest and drink plenty of water Continue Singulair and Advair as prescribed Duoneb in office today eRx for Pred Taper x 6 days  RTC as needed or if symptoms persist.   Webb Silversmith, NP

## 2016-01-16 NOTE — Addendum Note (Signed)
Addended by: Lurlean Nanny on: 01/16/2016 04:44 PM   Modules accepted: Orders

## 2016-01-16 NOTE — Patient Instructions (Signed)
Cough, Adult Introduction A cough helps to clear your throat and lungs. A cough may last only 2-3 weeks (acute), or it may last longer than 8 weeks (chronic). Many different things can cause a cough. A cough may be a sign of an illness or another medical condition. Follow these instructions at home:  Pay attention to any changes in your cough.  Take medicines only as told by your doctor.  If you were prescribed an antibiotic medicine, take it as told by your doctor. Do not stop taking it even if you start to feel better.  Talk with your doctor before you try using a cough medicine.  Drink enough fluid to keep your pee (urine) clear or pale yellow.  If the air is dry, use a cold steam vaporizer or humidifier in your home.  Stay away from things that make you cough at work or at home.  If your cough is worse at night, try using extra pillows to raise your head up higher while you sleep.  Do not smoke, and try not to be around smoke. If you need help quitting, ask your doctor.  Do not have caffeine.  Do not drink alcohol.  Rest as needed. Contact a doctor if:  You have new problems (symptoms).  You cough up yellow fluid (pus).  Your cough does not get better after 2-3 weeks, or your cough gets worse.  Medicine does not help your cough and you are not sleeping well.  You have pain that gets worse or pain that is not helped with medicine.  You have a fever.  You are losing weight and you do not know why.  You have night sweats. Get help right away if:  You cough up blood.  You have trouble breathing.  Your heartbeat is very fast. This information is not intended to replace advice given to you by your health care provider. Make sure you discuss any questions you have with your health care provider. Document Released: 10/12/2010 Document Revised: 07/07/2015 Document Reviewed: 04/07/2014  2017 Elsevier  

## 2016-01-16 NOTE — Addendum Note (Signed)
Addended by: Lurlean Nanny on: 01/16/2016 04:35 PM   Modules accepted: Orders

## 2016-03-07 ENCOUNTER — Ambulatory Visit: Payer: 59 | Admitting: Family Medicine

## 2016-03-07 ENCOUNTER — Encounter: Payer: Self-pay | Admitting: Family Medicine

## 2016-03-07 ENCOUNTER — Ambulatory Visit (INDEPENDENT_AMBULATORY_CARE_PROVIDER_SITE_OTHER): Payer: 59 | Admitting: Family Medicine

## 2016-03-07 VITALS — BP 130/80 | HR 84 | Temp 98.5°F | Wt 241.5 lb

## 2016-03-07 DIAGNOSIS — E669 Obesity, unspecified: Secondary | ICD-10-CM

## 2016-03-07 DIAGNOSIS — J302 Other seasonal allergic rhinitis: Secondary | ICD-10-CM | POA: Diagnosis not present

## 2016-03-07 DIAGNOSIS — R011 Cardiac murmur, unspecified: Secondary | ICD-10-CM

## 2016-03-07 DIAGNOSIS — J4540 Moderate persistent asthma, uncomplicated: Secondary | ICD-10-CM

## 2016-03-07 DIAGNOSIS — E119 Type 2 diabetes mellitus without complications: Secondary | ICD-10-CM | POA: Diagnosis not present

## 2016-03-07 DIAGNOSIS — I10 Essential (primary) hypertension: Secondary | ICD-10-CM | POA: Diagnosis not present

## 2016-03-07 DIAGNOSIS — IMO0001 Reserved for inherently not codable concepts without codable children: Secondary | ICD-10-CM

## 2016-03-07 MED ORDER — ALBUTEROL SULFATE (2.5 MG/3ML) 0.083% IN NEBU: 2.5000 mg | INHALATION_SOLUTION | Freq: Four times a day (QID) | RESPIRATORY_TRACT | 1 refills | Status: DC | PRN

## 2016-03-07 NOTE — Assessment & Plan Note (Signed)
Discussed healthy diet and lifestyle changes to affect sustainable weight loss  

## 2016-03-07 NOTE — Progress Notes (Signed)
BP 130/80   Pulse 84   Temp 98.5 F (36.9 C) (Oral)   Wt 241 lb 8 oz (109.5 kg)   BMI 34.90 kg/m    CC: 24mo f/u visit Subjective:    Patient ID: Erik Allen, male    DOB: 04-May-1955, 61 y.o.   MRN: [id number]  HPI: Erik Allen is a 61 y.o. male presenting on 03/07/2016 for Follow-up   HTN - Compliant with current antihypertensive regimen of hyzaar 100/25 daily. Does check blood pressures at home: good readings. No low blood pressure readings or symptoms of dizziness/syncope.  Denies HA, vision changes, CP/tightness, SOB, leg swelling.    DM - new dx last year. Regularly does not check sugars. Compliant with antihyperglycemic regimen which includes: diet controlled. Denies low sugars or hypoglycemic symptoms.  Denies paresthesias. Last diabetic eye exam - states he will be due 04/2016.  Pneumovax: 01/2013.  Prevnar: not due yet. Weight gain noted. Continues walking 1 mile per day, avoiding added sugars.  Lab Results  Component Value Date   HGBA1C 6.4 10/31/2015   Diabetic Foot Exam - Simple   No data filed    -not done  Asthma - had a flare last month, treated with prednisone course. Finds he does better with nebulization than with his proair inhaler, which causes headache. Continues singulair and advair. Asks for nebulizer machine to use at home. Wakes up at night coughing twice weekly. Triggers are diesel smoke, dust, any inhalants "when air is compromised."   Relevant past medical, surgical, family and social history reviewed and updated as indicated. Interim medical history since our last visit reviewed. Allergies and medications reviewed and updated. Current Outpatient Prescriptions on File Prior to Visit  Medication Sig  . ADVAIR DISKUS 250-50 MCG/DOSE AEPB INHALE 1 DOSE BY MOUTH TWICE DAILY. RINSE MOUTH AFTER USE  . albuterol (PROAIR HFA) 108 (90 Base) MCG/ACT inhaler Inhale 2 puffs into the lungs every 6 (six) hours as needed for wheezing.  . cholecalciferol  (VITAMIN D) 1000 units tablet Take 2,000 Units by mouth daily.  Marland Kitchen dexlansoprazole (DEXILANT) 60 MG capsule Take 60 mg by mouth daily.  . finasteride (PROSCAR) 5 MG tablet TAKE 1 TABLET BY MOUTH EVERY DAY  . losartan-hydrochlorothiazide (HYZAAR) 100-25 MG tablet Take 1 tablet by mouth daily.  . montelukast (SINGULAIR) 10 MG tablet TAKE 1 TABLET BY MOUTH AT BEDTIME  . simvastatin (ZOCOR) 80 MG tablet TAKE 1 TABLET BY MOUTH AT BEDTIME   No current facility-administered medications on file prior to visit.     Review of Systems Per HPI unless specifically indicated in ROS section     Objective:    BP 130/80   Pulse 84   Temp 98.5 F (36.9 C) (Oral)   Wt 241 lb 8 oz (109.5 kg)   BMI 34.90 kg/m   Wt Readings from Last 3 Encounters:  03/07/16 241 lb 8 oz (109.5 kg)  01/16/16 238 lb (108 kg)  10/31/15 241 lb 8 oz (109.5 kg)    Physical Exam  Constitutional: He appears well-developed and well-nourished. No distress.  HENT:  Mouth/Throat: Oropharynx is clear and moist. No oropharyngeal exudate.  Cardiovascular: Normal rate, regular rhythm and intact distal pulses.   Murmur (2/6 systolic) heard. Pulmonary/Chest: Effort normal and breath sounds normal. No respiratory distress. He has no wheezes. He has no rales.  Musculoskeletal: He exhibits no edema.  Skin: Skin is warm and dry. No rash noted.  Psychiatric: He has a normal mood and affect.  Nursing note and vitals reviewed.     Assessment & Plan:   Problem List Items Addressed This Visit    Allergic rhinitis   Asthma - Primary    Chronic, stable. Continue current regimen. Discussed increased possible advair dosing in spring      Relevant Medications   albuterol (PROVENTIL) (2.5 MG/3ML) 0.083% nebulizer solution   Diet-controlled diabetes mellitus ([facility])    Chronic, stable. Continue current regimen.       Essential hypertension    Chronic, stable. Continue current regimen.      Obesity, Class II, BMI 35-39.9, with  comorbidity    Discussed healthy diet and lifestyle changes to affect sustainable weight loss.      Systolic murmur    Stable period.          Follow up plan: Return in about 4 months (around 07/05/2016) for annual exam, prior fasting for blood work.  Erik Bush, MD

## 2016-03-07 NOTE — Assessment & Plan Note (Signed)
Chronic, stable. Continue current regimen. 

## 2016-03-07 NOTE — Assessment & Plan Note (Signed)
Stable period.  

## 2016-03-07 NOTE — Patient Instructions (Addendum)
I have sent albuterol nebulizer to pharmacy Continue advair and singulair, consider increased dose of advair with spring coming on.  Return in 4 months for physical

## 2016-03-07 NOTE — Progress Notes (Signed)
Pre visit review using our clinic review tool, if applicable. No additional management support is needed unless otherwise documented below in the visit note. 

## 2016-03-07 NOTE — Assessment & Plan Note (Signed)
Chronic, stable. Continue current regimen. Discussed increased possible advair dosing in spring

## 2016-05-29 ENCOUNTER — Other Ambulatory Visit: Payer: Self-pay | Admitting: Family Medicine

## 2016-06-30 ENCOUNTER — Other Ambulatory Visit: Payer: Self-pay | Admitting: Family Medicine

## 2016-06-30 DIAGNOSIS — E119 Type 2 diabetes mellitus without complications: Secondary | ICD-10-CM

## 2016-06-30 DIAGNOSIS — E785 Hyperlipidemia, unspecified: Secondary | ICD-10-CM

## 2016-06-30 DIAGNOSIS — K760 Fatty (change of) liver, not elsewhere classified: Secondary | ICD-10-CM

## 2016-06-30 DIAGNOSIS — E559 Vitamin D deficiency, unspecified: Secondary | ICD-10-CM

## 2016-06-30 DIAGNOSIS — N400 Enlarged prostate without lower urinary tract symptoms: Secondary | ICD-10-CM

## 2016-07-03 ENCOUNTER — Encounter (INDEPENDENT_AMBULATORY_CARE_PROVIDER_SITE_OTHER): Payer: Self-pay

## 2016-07-03 ENCOUNTER — Other Ambulatory Visit: Payer: Self-pay | Admitting: Family Medicine

## 2016-07-03 ENCOUNTER — Other Ambulatory Visit (INDEPENDENT_AMBULATORY_CARE_PROVIDER_SITE_OTHER): Payer: 59

## 2016-07-03 DIAGNOSIS — N400 Enlarged prostate without lower urinary tract symptoms: Secondary | ICD-10-CM

## 2016-07-03 DIAGNOSIS — E785 Hyperlipidemia, unspecified: Secondary | ICD-10-CM | POA: Diagnosis not present

## 2016-07-03 DIAGNOSIS — E559 Vitamin D deficiency, unspecified: Secondary | ICD-10-CM

## 2016-07-03 DIAGNOSIS — K760 Fatty (change of) liver, not elsewhere classified: Secondary | ICD-10-CM

## 2016-07-03 DIAGNOSIS — E119 Type 2 diabetes mellitus without complications: Secondary | ICD-10-CM

## 2016-07-03 LAB — LIPID PANEL
Cholesterol: 166 mg/dL (ref 0–200)
HDL: 53.9 mg/dL (ref >39.00)
LDL Cholesterol: 81 mg/dL (ref 0–99)
NONHDL: 111.86
TRIGLYCERIDES: 154 mg/dL — AB (ref 0.0–149.0)
Total CHOL/HDL Ratio: 3
VLDL: 30.8 mg/dL (ref 0.0–40.0)

## 2016-07-03 LAB — COMPREHENSIVE METABOLIC PANEL
ALK PHOS: 48 U/L (ref 39–117)
ALT: 66 U/L — AB (ref 0–53)
AST: 43 U/L — ABNORMAL HIGH (ref 0–37)
Albumin: 4.2 g/dL (ref 3.5–5.2)
BILIRUBIN TOTAL: 0.5 mg/dL (ref 0.2–1.2)
BUN: 22 mg/dL (ref 6–23)
CALCIUM: 9.5 mg/dL (ref 8.4–10.5)
CO2: 30 mEq/L (ref 19–32)
Chloride: 102 mEq/L (ref 96–112)
Creatinine, Ser: 1.02 mg/dL (ref 0.40–1.50)
GFR: 79.5 mL/min (ref >60.00)
Glucose, Bld: 103 mg/dL — ABNORMAL HIGH (ref 70–99)
Potassium: 4.3 mEq/L (ref 3.5–5.1)
SODIUM: 140 meq/L (ref 135–145)
TOTAL PROTEIN: 6.7 g/dL (ref 6.0–8.3)

## 2016-07-03 LAB — HEMOGLOBIN A1C: Hgb A1c MFr Bld: 6.4 % (ref 4.6–6.5)

## 2016-07-03 LAB — PSA: PSA: 0.14 ng/mL (ref 0.10–4.00)

## 2016-07-03 LAB — VITAMIN D 25 HYDROXY (VIT D DEFICIENCY, FRACTURES): VITD: 37.56 ng/mL (ref 30.00–100.00)

## 2016-07-06 ENCOUNTER — Ambulatory Visit (INDEPENDENT_AMBULATORY_CARE_PROVIDER_SITE_OTHER): Payer: 59 | Admitting: Family Medicine

## 2016-07-06 VITALS — BP 142/82 | HR 72 | Temp 98.2°F | Ht 68.0 in | Wt 242.2 lb

## 2016-07-06 DIAGNOSIS — E119 Type 2 diabetes mellitus without complications: Secondary | ICD-10-CM

## 2016-07-06 DIAGNOSIS — N400 Enlarged prostate without lower urinary tract symptoms: Secondary | ICD-10-CM

## 2016-07-06 DIAGNOSIS — E559 Vitamin D deficiency, unspecified: Secondary | ICD-10-CM | POA: Diagnosis not present

## 2016-07-06 DIAGNOSIS — IMO0001 Reserved for inherently not codable concepts without codable children: Secondary | ICD-10-CM

## 2016-07-06 DIAGNOSIS — K760 Fatty (change of) liver, not elsewhere classified: Secondary | ICD-10-CM

## 2016-07-06 DIAGNOSIS — E785 Hyperlipidemia, unspecified: Secondary | ICD-10-CM

## 2016-07-06 DIAGNOSIS — E669 Obesity, unspecified: Secondary | ICD-10-CM | POA: Diagnosis not present

## 2016-07-06 DIAGNOSIS — M67442 Ganglion, left hand: Secondary | ICD-10-CM | POA: Diagnosis not present

## 2016-07-06 DIAGNOSIS — Z0001 Encounter for general adult medical examination with abnormal findings: Secondary | ICD-10-CM

## 2016-07-06 DIAGNOSIS — R5383 Other fatigue: Secondary | ICD-10-CM | POA: Diagnosis not present

## 2016-07-06 DIAGNOSIS — Z0000 Encounter for general adult medical examination without abnormal findings: Secondary | ICD-10-CM

## 2016-07-06 DIAGNOSIS — I10 Essential (primary) hypertension: Secondary | ICD-10-CM

## 2016-07-06 MED ORDER — METFORMIN HCL 500 MG PO TABS: 500.0000 mg | ORAL_TABLET | Freq: Two times a day (BID) | ORAL | 3 refills | Status: DC

## 2016-07-06 NOTE — Assessment & Plan Note (Signed)
Chronic ,continue current regimen.

## 2016-07-06 NOTE — Assessment & Plan Note (Signed)
Discussed healthy diet and lifestyle changes to affect sustainable weight loss  

## 2016-07-06 NOTE — Assessment & Plan Note (Addendum)
ESS today = 2 - low risk OSA although does have body habitus for this. Will continue to monitor.  Consider further eval next labs (B12, CBC, TSH).

## 2016-07-06 NOTE — Assessment & Plan Note (Signed)
S/p aspiration x2 with recurrence. Will refer to hand surg for definitive management

## 2016-07-06 NOTE — Patient Instructions (Addendum)
Sleep questionairre today.  Start metformin 500mg  once daily with meals.  Return in 4 months for follow up visit.  You are doing well today, call us with questions.  Health Maintenance, Male A healthy lifestyle and preventive care is important for your health and wellness. Ask your health care provider about what schedule of regular examinations is right for you. What should I know about weight and diet?  Eat a Healthy Diet  Eat plenty of vegetables, fruits, whole grains, low-fat dairy products, and lean protein.  Do not eat a lot of foods high in solid fats, added sugars, or salt. Maintain a Healthy Weight  Regular exercise can help you achieve or maintain a healthy weight. You should:  Do at least 150 minutes of exercise each week. The exercise should increase your heart rate and make you sweat (moderate-intensity exercise).  Do strength-training exercises at least twice a week. Watch Your Levels of Cholesterol and Blood Lipids  Have your blood tested for lipids and cholesterol every 5 years starting at 59 years of age. If you are at high risk for heart disease, you should start having your blood tested when you are 59 years old. You may need to have your cholesterol levels checked more often if:  Your lipid or cholesterol levels are high.  You are older than 59 years of age.  You are at high risk for heart disease. What should I know about cancer screening? Many types of cancers can be detected early and may often be prevented. Lung Cancer  You should be screened every year for lung cancer if:  You are a current smoker who has smoked for at least 30 years.  You are a former smoker who has quit within the past 15 years.  Talk to your health care provider about your screening options, when you should start screening, and how often you should be screened. Colorectal Cancer  Routine colorectal cancer screening usually begins at 59 years of age and should be repeated every 5-10  years until you are 59 years old. You may need to be screened more often if early forms of precancerous polyps or small growths are found. Your health care provider may recommend screening at an earlier age if you have risk factors for colon cancer.  Your health care provider may recommend using home test kits to check for hidden blood in the stool.  A small camera at the end of a tube can be used to examine your colon (sigmoidoscopy or colonoscopy). This checks for the earliest forms of colorectal cancer. Prostate and Testicular Cancer  Depending on your age and overall health, your health care provider may do certain tests to screen for prostate and testicular cancer.  Talk to your health care provider about any symptoms or concerns you have about testicular or prostate cancer. Skin Cancer  Check your skin from head to toe regularly.  Tell your health care provider about any new moles or changes in moles, especially if:  There is a change in a mole's size, shape, or color.  You have a mole that is larger than a pencil eraser.  Always use sunscreen. Apply sunscreen liberally and repeat throughout the day.  Protect yourself by wearing long sleeves, pants, a wide-brimmed hat, and sunglasses when outside. What should I know about heart disease, diabetes, and high blood pressure?  If you are 65-72 years of age, have your blood pressure checked every 3-5 years. If you are 30 years of age or older,  have your blood pressure checked every year. You should have your blood pressure measured twice-once when you are at a hospital or clinic, and once when you are not at a hospital or clinic. Record the average of the two measurements. To check your blood pressure when you are not at a hospital or clinic, you can use:  An automated blood pressure machine at a pharmacy.  A home blood pressure monitor.  Talk to your health care provider about your target blood pressure.  If you are between 60-80  years old, ask your health care provider if you should take aspirin to prevent heart disease.  Have regular diabetes screenings by checking your fasting blood sugar level.  If you are at a normal weight and have a low risk for diabetes, have this test once every three years after the age of 62.  If you are overweight and have a high risk for diabetes, consider being tested at a younger age or more often.  A one-time screening for abdominal aortic aneurysm (AAA) by ultrasound is recommended for men aged 64-75 years who are current or former smokers. What should I know about preventing infection? Hepatitis B  If you have a higher risk for hepatitis B, you should be screened for this virus. Talk with your health care provider to find out if you are at risk for hepatitis B infection. Hepatitis C  Blood testing is recommended for:  Everyone born from [birth year] through 1963.  Anyone with known risk factors for hepatitis C. Sexually Transmitted Diseases (STDs)  You should be screened each year for STDs including gonorrhea and chlamydia if:  You are sexually active and are younger than 59 years of age.  You are older than 59 years of age and your health care provider tells you that you are at risk for this type of infection.  Your sexual activity has changed since you were last screened and you are at an increased risk for chlamydia or gonorrhea. Ask your health care provider if you are at risk.  Talk with your health care provider about whether you are at high risk of being infected with HIV. Your health care provider may recommend a prescription medicine to help prevent HIV infection. What else can I do?  Schedule regular health, dental, and eye exams.  Stay current with your vaccines (immunizations).  Do not use any tobacco products, such as cigarettes, chewing tobacco, and e-cigarettes. If you need help quitting, ask your health care provider.  Limit alcohol intake to no more than 2  drinks per day. One drink equals 12 ounces of beer, 5 ounces of wine, or 1 ounces of hard liquor.  Do not use street drugs.  Do not share needles.  Ask your health care provider for help if you need support or information about quitting drugs.  Tell your health care provider if you often feel depressed.  Tell your health care provider if you have ever been abused or do not feel safe at home. This information is not intended to replace advice given to you by your health care provider. Make sure you discuss any questions you have with your health care provider. Document Released: 07/28/2007 Document Revised: 09/28/2015 Document Reviewed: 11/02/2014 Elsevier Interactive Patient Education  2017 [employer].

## 2016-07-06 NOTE — Assessment & Plan Note (Signed)
With mild transaminitis

## 2016-07-06 NOTE — Progress Notes (Signed)
BP (!) 142/82   Pulse 72   Temp 98.2 F (36.8 C) (Oral)   Ht 5\' 8"  (1.727 m)   Wt 242 lb 4 oz (109.9 kg)   SpO2 95%   BMI 36.83 kg/m    CC: CPE Subjective:    Patient ID: Erik Allen, male    DOB: November 24, 1955, 61 y.o.   MRN: [id number]  HPI: Erik Allen is a 61 y.o. male presenting on 07/06/2016 for Annual Exam; Hand Pain (knot on L hand); and Fatigue   "I'm extremely tired" - no energy. Has tried 5 hr energy. No significant daytime somnolence. Poor sleep, sleeps on side. Non-restorative sleep. No known snoring. No PNdyspnea.   L middle finger DIP nodule - saw derm told ganglion cyst s/p aspiration, reaccumulated. Would like definitive treatment.   Preventative: Colonsocopy - 2010 WNL, rpt due 10 yrs. No BM changes, blood in stool Prostate - yearly screen, normal. Not strong stream but "good stream", nocturia x1-3. Known BPH on proscar. Flu shot yearly Pneumovax 2014 Td 1996, 2006, Tdap 2016 Seat belt use discussed Sunscreen use discussed, no suspicious moles.  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 Interim medical history since our last visit reviewed. Allergies and medications reviewed and updated. Outpatient Medications Prior to Visit  Medication Sig Dispense Refill  . ADVAIR DISKUS 250-50 MCG/DOSE AEPB INHALE 1 DOSE BY MOUTH TWICE DAILY. RINSE MOUTH AFTER USE 60 each 11  . albuterol (PROAIR HFA) 108 (90 Base) MCG/ACT inhaler Inhale 2 puffs into the lungs every 6 (six) hours as needed for wheezing. 1 Inhaler 11  . albuterol (PROVENTIL) (2.5 MG/3ML) 0.083% nebulizer solution Take 3 mLs (2.5 mg total) by nebulization every 6 (six) hours as needed for  wheezing or shortness of breath. 150 mL 1  . cholecalciferol (VITAMIN D) 1000 units tablet Take 2,000 Units by mouth daily.    Marland Kitchen dexlansoprazole (DEXILANT) 60 MG capsule Take 60 mg by mouth daily.    . finasteride (PROSCAR) 5 MG tablet TAKE 1 TABLET BY MOUTH EVERY DAY 90 tablet 0  . losartan-hydrochlorothiazide (HYZAAR) 100-25 MG tablet Take 1 tablet by mouth daily. 90 tablet 3  . montelukast (SINGULAIR) 10 MG tablet TAKE 1 TABLET BY MOUTH AT BEDTIME 30 tablet 11  . simvastatin (ZOCOR) 80 MG tablet TAKE 1 TABLET BY MOUTH AT BEDTIME 90 tablet 1   No facility-administered medications prior to visit.      Per HPI unless specifically indicated in ROS section below Review of Systems  Constitutional: Negative for activity change, appetite change, chills, fatigue, fever and unexpected weight change.  HENT: Negative for hearing loss.   Eyes: Negative for visual disturbance.  Respiratory: Positive for cough and shortness of breath. Negative for chest tightness and wheezing.   Cardiovascular: Negative for chest pain, palpitations and leg swelling.  Gastrointestinal: Negative for abdominal distention, abdominal pain, blood in stool, constipation, diarrhea, nausea and vomiting.  Genitourinary: Negative for difficulty urinating and hematuria.  Musculoskeletal: Negative for arthralgias, myalgias and neck pain.  Skin: Negative for rash.  Neurological: Positive for dizziness (few episodes of vertigo). Negative for seizures, syncope and headaches.  Hematological: Negative for adenopathy. Does not bruise/bleed easily.  Psychiatric/Behavioral: Negative for dysphoric mood.  The patient is not nervous/anxious.        Objective:    BP (!) 142/82   Pulse 72   Temp 98.2 F (36.8 C) (Oral)   Ht 5\' 8"  (1.727 m)   Wt 242 lb 4 oz (109.9 kg)   SpO2 95%   BMI 36.83 kg/m   Wt Readings from Last 3 Encounters:  07/06/16 242 lb 4 oz (109.9 kg)  03/07/16 241 lb 8 oz (109.5 kg)  01/16/16 238 lb (108 kg)      Physical Exam  Constitutional: He is oriented to person, place, and time. He appears well-developed and well-nourished. No distress.  HENT:  Head: Normocephalic and atraumatic.  Right Ear: Hearing, tympanic membrane, external ear and ear canal normal.  Left Ear: Hearing, tympanic membrane, external ear and ear canal normal.  Nose: Nose normal.  Mouth/Throat: Uvula is midline, oropharynx is clear and moist and mucous membranes are normal. No oropharyngeal exudate, posterior oropharyngeal edema or posterior oropharyngeal erythema.  Eyes: Conjunctivae and EOM are normal. Pupils are equal, round, and reactive to light. No scleral icterus.  Neck: Normal range of motion. Neck supple. No thyromegaly present.  Cardiovascular: Normal rate, regular rhythm, normal heart sounds and intact distal pulses.   No murmur heard. Pulses:      Radial pulses are 2+ on the right side, and 2+ on the left side.  Pulmonary/Chest: Effort normal and breath sounds normal. No respiratory distress. He has no wheezes. He has no rales.  Abdominal: Soft. Bowel sounds are normal. He exhibits no distension and no mass. There is no tenderness. There is no rebound and no guarding.  Genitourinary: Rectum normal and prostate normal. Rectal exam shows no external hemorrhoid, no fissure, no mass, no tenderness and anal tone normal. Prostate is not enlarged (20gm) and not tender.  Musculoskeletal: Normal range of motion. He exhibits no edema.  L 3rd digit DIP with ganglion cyst that affects full flexion at DIP  Lymphadenopathy:    He has no cervical adenopathy.  Neurological: He is alert and oriented to person, place, and time.  CN grossly intact, station and gait intact  Skin: Skin is warm and dry. No rash noted.  Psychiatric: He has a normal mood and affect. His behavior is normal. Judgment and thought content normal.  Nursing note and vitals reviewed.  Results for orders placed or performed in visit on 07/03/16  Lipid panel   Result Value Ref Range   Cholesterol 166 0 - 200 mg/dL   Triglycerides 154.0 (H) 0.0 - 149.0 mg/dL   HDL 53.90 >39.00 mg/dL   VLDL 30.8 0.0 - 40.0 mg/dL   LDL Cholesterol 81 0 - 99 mg/dL   Total CHOL/HDL Ratio 3    NonHDL 111.86   Hemoglobin A1c  Result Value Ref Range   Hgb A1c MFr Bld 6.4 4.6 - 6.5 %  PSA  Result Value Ref Range   PSA 0.14 0.10 - 4.00 ng/mL  VITAMIN D 25 Hydroxy (Vit-D Deficiency, Fractures)  Result Value Ref Range   VITD 37.56 30.00 - 100.00 ng/mL  Comprehensive metabolic panel  Result Value Ref Range   Sodium 140 135 - 145 mEq/L   Potassium 4.3 3.5 - 5.1 mEq/L   Chloride 102 96 - 112 mEq/L   CO2 30 19 - 32 mEq/L   Glucose, Bld 103 (H) 70 - 99 mg/dL   BUN 22 6 - 23 mg/dL   Creatinine, Ser 1.02 0.40 - 1.50 mg/dL   Total Bilirubin 0.5  0.2 - 1.2 mg/dL   Alkaline Phosphatase 48 39 - 117 U/L   AST 43 (H) 0 - 37 U/L   ALT 66 (H) 0 - 53 U/L   Total Protein 6.7 6.0 - 8.3 g/dL   Albumin 4.2 3.5 - 5.2 g/dL   Calcium 9.5 8.4 - 10.5 mg/dL   GFR 79.50 >60.00 mL/min      Assessment & Plan:   Problem List Items Addressed This Visit    Benign prostatic hyperplasia    Chronic, stable. Continue finasteride.       Diet-controlled diabetes mellitus ([place])    Pt desires to start metformin which is reasonable. 500mg  sent to pharmacy to take daily. RTC 4 mo f/u visit      Relevant Medications   metFORMIN (GLUCOPHAGE) 500 MG tablet   Essential hypertension    Chronic ,continue current regimen.       Fatigue    ESS today = 2 - low risk OSA although does have body habitus for this. Will continue to monitor.  Consider further eval next labs (B12, CBC, TSH).       Fatty liver disease, nonalcoholic    With mild transaminitis      Ganglion cyst of finger of left hand    S/p aspiration x2 with recurrence. Will refer to hand surg for definitive management      Relevant Orders   Ambulatory referral to [place] maintenance - Primary     Preventative protocols reviewed and updated unless pt declined. Discussed healthy diet and lifestyle.       HLD (hyperlipidemia)    Chronic, stable. Continue current regimen       Obesity, Class II, BMI 35-39.9, with comorbidity    Discussed healthy diet and lifestyle changes to affect sustainable weight loss.       Relevant Medications   metFORMIN (GLUCOPHAGE) 500 MG tablet   Vitamin D deficiency    Continue 2000-5000 IU daily.          Follow up plan: Return in about 4 months (around 11/06/2016) for follow up visit.  Ria Bush, MD

## 2016-07-06 NOTE — Assessment & Plan Note (Signed)
Continue 2000-5000 IU daily.

## 2016-07-06 NOTE — Assessment & Plan Note (Signed)
Chronic, stable. Continue finasteride.  

## 2016-07-06 NOTE — Assessment & Plan Note (Signed)
Chronic, stable. Continue current regimen. 

## 2016-07-06 NOTE — Assessment & Plan Note (Signed)
Pt desires to start metformin which is reasonable. 500mg  sent to pharmacy to take daily. RTC 4 mo f/u visit

## 2016-07-06 NOTE — Assessment & Plan Note (Signed)
Preventative protocols reviewed and updated unless pt declined. Discussed healthy diet and lifestyle.  

## 2016-07-25 ENCOUNTER — Other Ambulatory Visit: Payer: Self-pay | Admitting: Family Medicine

## 2016-07-27 ENCOUNTER — Encounter (HOSPITAL_BASED_OUTPATIENT_CLINIC_OR_DEPARTMENT_OTHER): Payer: Self-pay | Admitting: *Deleted

## 2016-07-30 ENCOUNTER — Encounter (HOSPITAL_BASED_OUTPATIENT_CLINIC_OR_DEPARTMENT_OTHER)
Admission: RE | Admit: 2016-07-30 | Discharge: 2016-07-30 | Disposition: A | Discharge status: Home / Self Care | Payer: 59 | Source: Ambulatory Visit | Attending: Orthopedic Surgery | Admitting: Orthopedic Surgery

## 2016-07-30 DIAGNOSIS — K219 Gastro-esophageal reflux disease without esophagitis: Secondary | ICD-10-CM | POA: Diagnosis not present

## 2016-07-30 DIAGNOSIS — Z79899 Other long term (current) drug therapy: Secondary | ICD-10-CM | POA: Diagnosis not present

## 2016-07-30 DIAGNOSIS — E785 Hyperlipidemia, unspecified: Secondary | ICD-10-CM | POA: Diagnosis not present

## 2016-07-30 DIAGNOSIS — J45909 Unspecified asthma, uncomplicated: Secondary | ICD-10-CM | POA: Diagnosis not present

## 2016-07-30 DIAGNOSIS — M25842 Other specified joint disorders, left hand: Secondary | ICD-10-CM | POA: Diagnosis present

## 2016-07-30 DIAGNOSIS — I10 Essential (primary) hypertension: Secondary | ICD-10-CM | POA: Diagnosis not present

## 2016-07-30 DIAGNOSIS — N400 Enlarged prostate without lower urinary tract symptoms: Secondary | ICD-10-CM | POA: Diagnosis not present

## 2016-07-30 DIAGNOSIS — Z7984 Long term (current) use of oral hypoglycemic drugs: Secondary | ICD-10-CM | POA: Diagnosis not present

## 2016-07-30 DIAGNOSIS — M19042 Primary osteoarthritis, left hand: Secondary | ICD-10-CM | POA: Diagnosis not present

## 2016-07-30 DIAGNOSIS — E119 Type 2 diabetes mellitus without complications: Secondary | ICD-10-CM | POA: Diagnosis not present

## 2016-07-30 DIAGNOSIS — M71342 Other bursal cyst, left hand: Secondary | ICD-10-CM | POA: Diagnosis not present

## 2016-07-30 MED ORDER — LIDOCAINE HCL (CARDIAC) 20 MG/ML IV SOLN
INTRAVENOUS | Status: AC
  Filled 2016-07-30: qty 5

## 2016-07-31 ENCOUNTER — Other Ambulatory Visit: Payer: Self-pay | Admitting: Orthopedic Surgery

## 2016-08-02 ENCOUNTER — Ambulatory Visit (HOSPITAL_BASED_OUTPATIENT_CLINIC_OR_DEPARTMENT_OTHER)
Admission: RE | Admit: 2016-08-02 | Discharge: 2016-08-02 | Disposition: A | Discharge status: Home / Self Care | Payer: 59 | Source: Ambulatory Visit | Attending: Orthopedic Surgery | Admitting: Orthopedic Surgery

## 2016-08-02 ENCOUNTER — Encounter (HOSPITAL_BASED_OUTPATIENT_CLINIC_OR_DEPARTMENT_OTHER)
Admission: RE | Disposition: A | Discharge status: Home / Self Care | Payer: Self-pay | Source: Ambulatory Visit | Attending: Orthopedic Surgery

## 2016-08-02 ENCOUNTER — Ambulatory Visit (HOSPITAL_BASED_OUTPATIENT_CLINIC_OR_DEPARTMENT_OTHER): Payer: 59 | Admitting: Anesthesiology

## 2016-08-02 ENCOUNTER — Encounter (HOSPITAL_BASED_OUTPATIENT_CLINIC_OR_DEPARTMENT_OTHER): Payer: Self-pay | Admitting: *Deleted

## 2016-08-02 DIAGNOSIS — M71342 Other bursal cyst, left hand: Secondary | ICD-10-CM | POA: Insufficient documentation

## 2016-08-02 DIAGNOSIS — E785 Hyperlipidemia, unspecified: Secondary | ICD-10-CM | POA: Insufficient documentation

## 2016-08-02 DIAGNOSIS — N400 Enlarged prostate without lower urinary tract symptoms: Secondary | ICD-10-CM | POA: Insufficient documentation

## 2016-08-02 DIAGNOSIS — Z7984 Long term (current) use of oral hypoglycemic drugs: Secondary | ICD-10-CM | POA: Insufficient documentation

## 2016-08-02 DIAGNOSIS — Z79899 Other long term (current) drug therapy: Secondary | ICD-10-CM | POA: Insufficient documentation

## 2016-08-02 DIAGNOSIS — M19042 Primary osteoarthritis, left hand: Secondary | ICD-10-CM | POA: Insufficient documentation

## 2016-08-02 DIAGNOSIS — I10 Essential (primary) hypertension: Secondary | ICD-10-CM | POA: Insufficient documentation

## 2016-08-02 DIAGNOSIS — K219 Gastro-esophageal reflux disease without esophagitis: Secondary | ICD-10-CM | POA: Insufficient documentation

## 2016-08-02 DIAGNOSIS — E119 Type 2 diabetes mellitus without complications: Secondary | ICD-10-CM | POA: Insufficient documentation

## 2016-08-02 DIAGNOSIS — J45909 Unspecified asthma, uncomplicated: Secondary | ICD-10-CM | POA: Insufficient documentation

## 2016-08-02 HISTORY — PX: ROTATION FLAP: SHX6211

## 2016-08-02 HISTORY — PX: EAR CYST EXCISION: SHX22

## 2016-08-02 LAB — GLUCOSE, CAPILLARY
GLUCOSE-CAPILLARY: 107 mg/dL — AB (ref 65–99)
GLUCOSE-CAPILLARY: 99 mg/dL (ref 65–99)

## 2016-08-02 SURGERY — EXCISION, SYNOVIAL CYST, POPLITEAL SPACE
Anesthesia: Monitor Anesthesia Care | Site: Finger | Laterality: Left

## 2016-08-02 MED ORDER — MIDAZOLAM HCL 2 MG/2ML IJ SOLN
INTRAMUSCULAR | Status: AC
  Filled 2016-08-02: qty 2

## 2016-08-02 MED ORDER — MIDAZOLAM HCL 2 MG/2ML IJ SOLN
1.0000 mg | INTRAMUSCULAR | Status: DC | PRN
  Administered 2016-08-02: 2 mg via INTRAVENOUS

## 2016-08-02 MED ORDER — CHLORHEXIDINE GLUCONATE 4 % EX LIQD: 60.0000 mL | Freq: Once | CUTANEOUS | Status: DC

## 2016-08-02 MED ORDER — CEFAZOLIN SODIUM-DEXTROSE 2-4 GM/100ML-% IV SOLN
2.0000 g | INTRAVENOUS | Status: AC
  Administered 2016-08-02: 2 g via INTRAVENOUS

## 2016-08-02 MED ORDER — OXYCODONE-ACETAMINOPHEN 5-325 MG PO TABS: 1.0000 | ORAL_TABLET | Freq: Four times a day (QID) | ORAL | 0 refills | Status: DC | PRN

## 2016-08-02 MED ORDER — PROPOFOL 10 MG/ML IV BOLUS
INTRAVENOUS | Status: DC | PRN
  Administered 2016-08-02: 20 mg via INTRAVENOUS

## 2016-08-02 MED ORDER — BUPIVACAINE HCL (PF) 0.25 % IJ SOLN
INTRAMUSCULAR | Status: DC | PRN
  Administered 2016-08-02: 10 mL

## 2016-08-02 MED ORDER — CEFAZOLIN SODIUM-DEXTROSE 2-4 GM/100ML-% IV SOLN
INTRAVENOUS | Status: AC
  Filled 2016-08-02: qty 100

## 2016-08-02 MED ORDER — FENTANYL CITRATE (PF) 100 MCG/2ML IJ SOLN: 25.0000 ug | INTRAMUSCULAR | Status: DC | PRN

## 2016-08-02 MED ORDER — LACTATED RINGERS IV SOLN
INTRAVENOUS | Status: DC
  Administered 2016-08-02: 10:00:00 via INTRAVENOUS

## 2016-08-02 MED ORDER — PROPOFOL 500 MG/50ML IV EMUL
INTRAVENOUS | Status: DC | PRN
  Administered 2016-08-02: 150 ug/kg/min via INTRAVENOUS

## 2016-08-02 MED ORDER — PROMETHAZINE HCL 25 MG/ML IJ SOLN: 6.2500 mg | INTRAMUSCULAR | Status: DC | PRN

## 2016-08-02 MED ORDER — LIDOCAINE HCL (PF) 0.5 % IJ SOLN
INTRAMUSCULAR | Status: DC | PRN
  Administered 2016-08-02: 50 mL via INTRAVENOUS

## 2016-08-02 MED ORDER — HYDROCODONE-ACETAMINOPHEN 7.5-325 MG PO TABS: 1.0000 | ORAL_TABLET | Freq: Once | ORAL | Status: DC | PRN

## 2016-08-02 MED ORDER — OXYCODONE HCL 5 MG PO TABS
5.0000 mg | ORAL_TABLET | Freq: Once | ORAL | Status: AC
Start: 2016-08-02 — End: 2016-08-02
  Administered 2016-08-02: 5 mg via ORAL

## 2016-08-02 MED ORDER — HYDROCODONE-ACETAMINOPHEN 5-325 MG PO TABS
ORAL_TABLET | ORAL | Status: AC
  Filled 2016-08-02: qty 1

## 2016-08-02 MED ORDER — OXYCODONE HCL 5 MG PO TABS
ORAL_TABLET | ORAL | Status: AC
  Filled 2016-08-02: qty 1

## 2016-08-02 MED ORDER — SCOPOLAMINE 1 MG/3DAYS TD PT72: 1.0000 | MEDICATED_PATCH | Freq: Once | TRANSDERMAL | Status: DC | PRN

## 2016-08-02 MED ORDER — FENTANYL CITRATE (PF) 100 MCG/2ML IJ SOLN
INTRAMUSCULAR | Status: AC
  Filled 2016-08-02: qty 2

## 2016-08-02 MED ORDER — FENTANYL CITRATE (PF) 100 MCG/2ML IJ SOLN
50.0000 ug | INTRAMUSCULAR | Status: DC | PRN
  Administered 2016-08-02: 100 ug via INTRAVENOUS

## 2016-08-02 SURGICAL SUPPLY — 47 items
BANDAGE COBAN STERILE 2 (GAUZE/BANDAGES/DRESSINGS) IMPLANT
BLADE MINI RND TIP GREEN BEAV (BLADE) IMPLANT
BLADE SURG 15 STRL LF DISP TIS (BLADE) ×1 IMPLANT
BLADE SURG 15 STRL SS (BLADE) ×2
BNDG CMPR 9X4 STRL LF SNTH (GAUZE/BANDAGES/DRESSINGS) ×1
BNDG COHESIVE 1X5 TAN STRL LF (GAUZE/BANDAGES/DRESSINGS) ×1 IMPLANT
BNDG COHESIVE 3X5 TAN STRL LF (GAUZE/BANDAGES/DRESSINGS) IMPLANT
BNDG ESMARK 4X9 LF (GAUZE/BANDAGES/DRESSINGS) ×1 IMPLANT
BNDG GAUZE ELAST 4 BULKY (GAUZE/BANDAGES/DRESSINGS) ×1 IMPLANT
CHLORAPREP W/TINT 26ML (MISCELLANEOUS) ×2 IMPLANT
CORDS BIPOLAR (ELECTRODE) ×2 IMPLANT
COVER BACK TABLE 60X90IN (DRAPES) ×2 IMPLANT
COVER MAYO STAND STRL (DRAPES) ×2 IMPLANT
CUFF TOURNIQUET SINGLE 18IN (TOURNIQUET CUFF) ×2 IMPLANT
DECANTER SPIKE VIAL GLASS SM (MISCELLANEOUS) IMPLANT
DRAIN PENROSE 1/2X12 LTX STRL (WOUND CARE) IMPLANT
DRAPE EXTREMITY T 121X128X90 (DRAPE) ×2 IMPLANT
DRAPE SURG 17X23 STRL (DRAPES) ×2 IMPLANT
GAUZE SPONGE 4X4 12PLY STRL (GAUZE/BANDAGES/DRESSINGS) ×2 IMPLANT
GAUZE XEROFORM 1X8 LF (GAUZE/BANDAGES/DRESSINGS) ×2 IMPLANT
GLOVE BIOGEL PI IND STRL 8.5 (GLOVE) ×1 IMPLANT
GLOVE BIOGEL PI INDICATOR 8.5 (GLOVE) ×1
GLOVE SURG ORTHO 8.0 STRL STRW (GLOVE) ×2 IMPLANT
GOWN STRL REUS W/ TWL LRG LVL3 (GOWN DISPOSABLE) ×1 IMPLANT
GOWN STRL REUS W/TWL LRG LVL3 (GOWN DISPOSABLE) ×2
GOWN STRL REUS W/TWL XL LVL3 (GOWN DISPOSABLE) ×2 IMPLANT
NDL PRECISIONGLIDE 27X1.5 (NEEDLE) IMPLANT
NEEDLE PRECISIONGLIDE 27X1.5 (NEEDLE) IMPLANT
NS IRRIG 1000ML POUR BTL (IV SOLUTION) ×2 IMPLANT
PACK BASIN DAY SURGERY FS (CUSTOM PROCEDURE TRAY) ×2 IMPLANT
PAD CAST 3X4 CTTN HI CHSV (CAST SUPPLIES) IMPLANT
PADDING CAST ABS 3INX4YD NS (CAST SUPPLIES)
PADDING CAST ABS 4INX4YD NS (CAST SUPPLIES) ×1
PADDING CAST ABS COTTON 3X4 (CAST SUPPLIES) IMPLANT
PADDING CAST ABS COTTON 4X4 ST (CAST SUPPLIES) ×1 IMPLANT
PADDING CAST COTTON 3X4 STRL (CAST SUPPLIES)
SPLINT FINGER 5.25 BULB (SOFTGOODS) ×1 IMPLANT
SPLINT PLASTER CAST XFAST 3X15 (CAST SUPPLIES) IMPLANT
SPLINT PLASTER XTRA FASTSET 3X (CAST SUPPLIES)
STOCKINETTE 4X48 STRL (DRAPES) ×2 IMPLANT
SUT ETHILON 4 0 PS 2 18 (SUTURE) ×2 IMPLANT
SUT VIC AB 4-0 P2 18 (SUTURE) IMPLANT
SUT VICRYL 4-0 PS2 18IN ABS (SUTURE) IMPLANT
SYR BULB 3OZ (MISCELLANEOUS) ×2 IMPLANT
SYR CONTROL 10ML LL (SYRINGE) IMPLANT
TOWEL OR 17X24 6PK STRL BLUE (TOWEL DISPOSABLE) ×4 IMPLANT
UNDERPAD 30X30 (UNDERPADS AND DIAPERS) ×2 IMPLANT

## 2016-08-02 NOTE — Discharge Instructions (Addendum)

## 2016-08-02 NOTE — Op Note (Signed)
NAME:  Erik Allen, Erik Allen NO.:  [id number]  MEDICAL RECORD NO.:  [id number]  LOCATION:                                 FACILITY:  PHYSICIAN:  Daryll Brod, M.D.            DATE OF BIRTH:  DATE OF PROCEDURE:  08/02/2016 DATE OF DISCHARGE:                              OPERATIVE REPORT   PREOPERATIVE DIAGNOSIS:  Mucoid cyst, left middle finger, extremely large with involution.  POSTOPERATIVE DIAGNOSIS:  Mucoid cyst, left middle finger, extremely large with involution.  OPERATION:  Excision of mucoid cyst; debridement of distal interphalangeal joint, with rotation dorsal flap, left middle finger.  SURGEON:  Daryll Brod, M.D.  ANESTHESIA:  Upper arm IV regional with metacarpal block.  PLACE OF SURGERY:  [facility].  ANESTHESIOLOGISTOla Spurr.  HISTORY:  The patient is a 61 year old male with a history of a large mucoid cyst on the dorsal aspect of his distal interphalangeal joint, left middle finger.  This has recently involuted, but leaves a large defect requiring rotation of a flap for closure.  Preoperative, perioperative, and postoperative courses have been discussed along with risks and complications.  He is aware there is no guarantee to the surgery; the possibility of infection; recurrence of injury to arteries, nerves, tendons; incomplete relief of symptoms; dystrophy.  In the preoperative area, the patient was seen, the extremity marked by both the patient and surgeon.  Antibiotic given.  PROCEDURE IN DETAIL:  The patient was brought to the operating room, where an upper arm IV regional anesthetic was carried out without difficulty under the direction of the Anesthesia Department.  He was prepped using ChloraPrep in a supine position with left arm free.  A 3- minute dry time was allowed.  Time-out taken, confirming the patient and procedure.  A metacarpal block was then given with 0.25% bupivacaine without epinephrine, 10 mL was used.   A curvilinear incision with elliptical excision of the tenuous skin was performed, this was brought down from the distal interphalangeal joint on the mid lateral line and then across just distal to the PIP joint.  This was carried down through subcutaneous tissue taking care to protect the neurovascular structures proximally.  The flap was then elevated.  The dissection of the cyst was carried out with sharp dissection down to the joint.  The joint was opened on its radial aspect.  This allowed visualization of the osteophytes beneath on the middle phalanx.  These were removed with House curette and hemostatic rongeur after opening the joint.  The specimen was sent to Pathology.  This was sent along with the skin. This left a defect distally which was unable to be closed.  The area was irrigated.  The flap was then undermined allowing it to be rotated distally.  This was then sutured into position with multiple 4-0 nylon sutures.  Care was taken to protect neurovascular structures throughout the procedure.  In the beginning of the case, the patient had an episode of coughing which forced blood through the tourniquet on his upper arm. This necessitated placing a Penrose drain proximally for hemostasis on the middle finger.  The Penrose  drain was removed, the finger flap pinked up with the blood that was in his forearm and hand. A sterile compressive dressing and splint to the finger were applied. The tourniquet was deflated.  He was brought to the recovery room for observation in satisfactory condition.  He will be discharged to home to return to the [place] in 1 week, on Percocet.          ______________________________ Daryll Brod, M.D.     GK/MEDQ  D:  08/02/2016  T:  08/02/2016  Job:  [id number]

## 2016-08-02 NOTE — Op Note (Signed)
Dictation Number [id number]

## 2016-08-02 NOTE — Anesthesia Preprocedure Evaluation (Addendum)
Anesthesia Evaluation  Patient identified by MRN, date of birth, ID band Patient awake    Reviewed: Allergy & Precautions, NPO status , Patient's Chart, lab work & pertinent test results  Airway Mallampati: II  TM Distance: >3 FB Neck ROM: Full    Dental   Pulmonary asthma ,    breath sounds clear to auscultation       Cardiovascular hypertension, Pt. on medications  Rhythm:Regular Rate:Normal     Neuro/Psych  Neuromuscular disease    GI/Hepatic Neg liver ROS, GERD  Medicated,  Endo/Other  diabetes, Type 2, Oral Hypoglycemic Agents  Renal/GU negative Renal ROS     Musculoskeletal   Abdominal   Peds  Hematology negative hematology ROS (+)   Anesthesia Other Findings   Reproductive/Obstetrics                            Anesthesia Physical Anesthesia Plan  ASA: II  Anesthesia Plan: Bier Block and MAC   Post-op Pain Management:    Induction: Intravenous  PONV Risk Score and Plan: 1 and Ondansetron and Dexamethasone  Airway Management Planned: Natural Airway and Simple Face Mask  Additional Equipment:   Intra-op Plan:   Post-operative Plan:   Informed Consent: I have reviewed the patients History and Physical, chart, labs and discussed the procedure including the risks, benefits and alternatives for the proposed anesthesia with the patient or authorized representative who has indicated his/her understanding and acceptance.     Plan Discussed with: CRNA and Surgeon  Anesthesia Plan Comments:       Anesthesia Quick Evaluation

## 2016-08-02 NOTE — H&P (Signed)
Erik Allen is an 61 y.o. male.   Chief Complaint:mass HPI: Erik Allen is a 61 year old right-hand-dominant former patient has not been seen in 6 years. He comes in with a complaint of a new problem. He has a large mass on the dorsal aspect distal interphalangeal joint left middle finger which is been present for approximately 6 weeks. He recalls no history of injury. He states that his dermatologist lanced this approximately 2 weeks ago and he has lanced once more since that time. He is referred by his primary care physician. Is Dr.Javier Danise Mina, Md it is not complaining of significant pain at the present time. Is not complaining of any evidence of infection. He has a history of diabetes no history of thyroid problems he does have a history of arthritis he has no history of gout. Family history is negative for each of these.      Past Medical History:  Diagnosis Date  . Asthma 09/13/99  . BENIGN PROSTATIC HYPERTROPHY 05/14/2003  . CAP (community acquired pneumonia) 01/31/2015  . Disorder of vocal cord 2012   h/o thrush on vocal cords per patient  . GERD (gastroesophageal reflux disease) 10/13/01   ENT eval for GERD and thrush 2011  . History of kidney stones   . HTN (hypertension)   . Hyperlipemia 10/13/97  . Seasonal allergic rhinitis    worse in spring  . Systolic murmur   . Transaminitis 2014   presumed fatty liver without [place], normal iron and viral hep panel    Past Surgical History:  Procedure Laterality Date  . A1 pulley release  10/2010   stensosing tenosynovitis, R milddle, ring, little fingers  . COLONOSCOPY  2010   WNL, rpt due 10 yrs  . CYSTECTOMY     from back  . CYSTOSCOPY  08/02   Stone retrieval  . FOOT CAPSULE RELEASE W/ PERCUTANEOUS HEEL CORD LENGTHENING, TIBIAL TENDON TRANSFER  04/09   bilateral, Dr. Milinda Allen  . INGUINAL HERNIA REPAIR  06/28/09   left, with mesh, Dr. Bary Allen  . US ECHOCARDIOGRAPHY  09/01   TR, MR    Family History  Problem Relation Age  of Onset  . Hypertension Mother   . Cancer Father 59       Lymphoma, recurrent  . Hypertension Father   . Hypertension Brother    Social History:  reports that he has never smoked. He has never used smokeless tobacco. He reports that he drinks about 0.5 - 1.0 oz of alcohol per week . He reports that he does not use drugs.  Allergies:  Allergies  Allergen Reactions  . Ace Inhibitors Cough  . Codeine     REACTION: DIZZY / HEADACHE  . Tramadol     REACTION: NAUSEA/ VOMITING    Medications Prior to Admission  Medication Sig Dispense Refill  . ADVAIR DISKUS 250-50 MCG/DOSE AEPB INHALE 1 DOSE BY MOUTH TWICE DAILY. RINSE MOUTH AFTER USE 60 each 11  . albuterol (PROAIR HFA) 108 (90 Base) MCG/ACT inhaler Inhale 2 puffs into the lungs every 6 (six) hours as needed for wheezing. 1 Inhaler 11  . albuterol (PROVENTIL) (2.5 MG/3ML) 0.083% nebulizer solution Take 3 mLs (2.5 mg total) by nebulization every 6 (six) hours as needed for wheezing or shortness of breath. 150 mL 1  . cholecalciferol (VITAMIN D) 1000 units tablet Take 2,000 Units by mouth daily.    Marland Kitchen dexlansoprazole (DEXILANT) 60 MG capsule Take 60 mg by mouth daily.    . finasteride (PROSCAR) 5 MG  tablet TAKE 1 TABLET BY MOUTH EVERY DAY 90 tablet 0  . losartan-hydrochlorothiazide (HYZAAR) 100-25 MG tablet Take 1 tablet by mouth daily. 90 tablet 3  . metFORMIN (GLUCOPHAGE) 500 MG tablet Take 1 tablet (500 mg total) by mouth 2 (two) times daily with a meal. 90 tablet 3  . montelukast (SINGULAIR) 10 MG tablet TAKE 1 TABLET BY MOUTH AT BEDTIME 30 tablet 5  . simvastatin (ZOCOR) 80 MG tablet TAKE 1 TABLET BY MOUTH AT BEDTIME 90 tablet 1    No results found for this or any previous visit (from the past 48 hour(s)).  No results found.   Pertinent items are noted in HPI.  Height 5\' 8"  (1.727 m), weight 108.9 kg (240 lb).  General appearance: alert, cooperative and appears stated age Head: Normocephalic, without obvious  abnormality Neck: no JVD Resp: clear to auscultation bilaterally Cardio: regular rate and rhythm, S1, S2 normal, no murmur, click, rub or gallop GI: soft, non-tender; bowel sounds normal; no masses,  no organomegaly Extremities: mass left middle finger Pulses: 2+ and symmetric Skin: Skin color, texture, turgor normal. No rashes or lesions Neurologic: Grossly normal Incision/Wound: na  Assessment/Plan Assessment:  1. Mass  Left middle 2. Osteoarthritis of finger of left hand  3. Mucoid cyst, joint    Plan: We have discussed surgical excision with him. Would recommend rotation of the dorsal flap and the due to the large size of the cyst which measures approximately a centimeter in diameter. Prepare and postoperative course are discussed along with risks and complications. He is aware that there is no guarantee to the surgery the possibility of infection recurrence injury to arteries nerves tendons complete relief symptoms dystrophy the possibility of partial loss of the flap. Questions are encouraged and answered to his satisfaction. He would like to proceed. This scheduled as an outpatient under regional anesthesia.      Erik Allen R 08/02/2016, 9:12 AM

## 2016-08-02 NOTE — Anesthesia Postprocedure Evaluation (Signed)
Anesthesia Post Note  Patient: HALVOR BEHREND  Procedure(s) Performed: Procedure(s) (LRB): EXCISION MUCOID CYST LEFT MIDDLE FINGER WITH DISTAL INTERPHALANGEAL JOINT ARHTROTOMY (Left) ROTATION DORSAL FLAP (Left)     Patient location during evaluation: PACU Anesthesia Type: MAC Level of consciousness: awake and alert Pain management: pain level controlled Vital Signs Assessment: post-procedure vital signs reviewed and stable Respiratory status: spontaneous breathing, nonlabored ventilation, respiratory function stable and patient connected to nasal cannula oxygen Cardiovascular status: stable and blood pressure returned to baseline Anesthetic complications: no    Last Vitals:  Vitals:   08/02/16 1145 08/02/16 1214  BP: 120/79 130/83  Pulse: 95 82  Resp: (!) 25 16  Temp:  36.6 C    Last Pain:  Vitals:   08/02/16 1214  TempSrc:   PainSc: 4                  Tiajuana Amass

## 2016-08-02 NOTE — Brief Op Note (Signed)
08/02/2016  11:10 AM  PATIENT:  Erik Allen  61 y.o. male  PRE-OPERATIVE DIAGNOSIS:  MUCOID CYST LEFT MIDDLE FINGER-DEGENERATIVE JOINT DESEASE DISTAL INTERPHALANGEAL  POST-OPERATIVE DIAGNOSIS:  MUCIOID CYST LEFT MIDDLE FINGER-DEGENERATIVE JOINT DISEASE DISTAL  PROCEDURE:  Procedure(s): EXCISION MUCOID CYST LEFT MIDDLE FINGER WITH DISTAL INTERPHALANGEAL JOINT ARHTROTOMY (Left) ROTATION DORSAL FLAP (Left)  SURGEON:  Surgeon(s) and Role:    Daryll Brod, MD - Primary  PHYSICIAN ASSISTANT:   ASSISTANTS: none   ANESTHESIA:   local and regional  EBL:  No intake/output data recorded.  BLOOD ADMINISTERED:none  DRAINS: none   LOCAL MEDICATIONS USED:  BUPIVICAINE   SPECIMEN:  Excision  DISPOSITION OF SPECIMEN:  PATHOLOGY  COUNTS:  YES  TOURNIQUET:  * Missing tourniquet times found for documented tourniquets in log:  [id number] *  DICTATION: .Other Dictation: Dictation Number [id number]  PLAN OF CARE: Discharge to home after PACU  PATIENT DISPOSITION:  PACU - hemodynamically stable.

## 2016-08-02 NOTE — Anesthesia Procedure Notes (Signed)
Anesthesia Regional Block: Bier block (IV Regional)   Pre-Anesthetic Checklist: ,, timeout performed, Correct Patient, Correct Site, Correct Laterality, Correct Procedure, Correct Position, site marked, Risks and benefits discussed, pre-op evaluation,  At surgeon's request and post-op pain management  Laterality: Right  Prep: chloraprep       Needles:       Needle Gauge: 20     Additional Needles:   Procedures:,,,,,,, Esmarch exsanguination, #20gu IV placed and double tourniquet utilized  Narrative:

## 2016-08-02 NOTE — Transfer of Care (Signed)
Immediate Anesthesia Transfer of Care Note  Patient: NAVID LENZEN  Procedure(s) Performed: Procedure(s): EXCISION MUCOID CYST LEFT MIDDLE FINGER WITH DISTAL INTERPHALANGEAL JOINT ARHTROTOMY (Left) ROTATION DORSAL FLAP (Left)  Patient Location: PACU  Anesthesia Type:MAC and Bier block  Level of Consciousness: awake, alert  and oriented  Airway & Oxygen Therapy: Patient Spontanous Breathing and Patient connected to face mask oxygen  Post-op Assessment: Report given to RN and Post -op Vital signs reviewed and stable  Post vital signs: Reviewed and stable  Last Vitals:  Vitals:   08/02/16 0934  BP: (!) 144/79  Pulse: 80  Resp: 18  Temp: 36.8 C    Last Pain:  Vitals:   08/02/16 0934  TempSrc: Oral      Patients Stated Pain Goal: 0 (09/47/09 6283)  Complications: No apparent anesthesia complications

## 2016-08-03 ENCOUNTER — Encounter (HOSPITAL_BASED_OUTPATIENT_CLINIC_OR_DEPARTMENT_OTHER): Payer: Self-pay | Admitting: Orthopedic Surgery

## 2016-08-03 NOTE — Addendum Note (Signed)
Addendum  created 08/03/16 0930 by Marrianne Mood, CRNA   Charge Capture section accepted

## 2016-08-24 ENCOUNTER — Other Ambulatory Visit: Payer: Self-pay | Admitting: Family Medicine

## 2016-09-04 ENCOUNTER — Telehealth: Payer: Self-pay

## 2016-09-04 NOTE — Telephone Encounter (Signed)
Received a PA request from [facility] for [facility]atin. I called to find out why simvastatin was needing a PA. They said he has to have it filled at [facility] or it would need a PA. I called the pt to let him know. His company just got bought out and they have new insurance coverage that requires they use [facility]. He will have his Rxs transferred.

## 2016-09-12 NOTE — Telephone Encounter (Signed)
Pt left v/m; pt has spoken with ins provider for meds. Pt does not have simvastatin and pt request call to [phone] for prior auth. Pt request cb.

## 2016-09-12 NOTE — Telephone Encounter (Signed)
Fax received indicating a PA required for pts Simvastatin. Reviewed previous note and contacted [facility]. They say they recently switched all Rx to [facility] and to disregard PA request. Pharmacy updated

## 2016-09-17 NOTE — Telephone Encounter (Signed)
Form received for completion of PA.Originally we, along with pt were advised that it was triggered for meds being sent to [facility] instead of [facility].   Spoke to [employer] who states they are unaware of why PA is required, but that it may have to go mail order. Advised pharmacist I would complete PA form and await decision.

## 2016-09-25 NOTE — Telephone Encounter (Signed)
Fax received indicating approval valid through 09/17/2017. Copy faxed to [facility]

## 2016-10-08 DIAGNOSIS — L570 Actinic keratosis: Secondary | ICD-10-CM | POA: Diagnosis not present

## 2016-10-12 ENCOUNTER — Other Ambulatory Visit: Payer: Self-pay | Admitting: *Deleted

## 2016-10-12 MED ORDER — FLUTICASONE-SALMETEROL 250-50 MCG/DOSE IN AEPB: INHALATION_SPRAY | RESPIRATORY_TRACT | 5 refills | Status: DC

## 2016-10-12 MED ORDER — FINASTERIDE 5 MG PO TABS: 5.0000 mg | ORAL_TABLET | Freq: Every day | ORAL | 1 refills | Status: DC

## 2016-10-12 NOTE — Telephone Encounter (Signed)
Patient left a voicemail stating that he is changing his pharmacy to [facility]. Patient requested refills on Advair and Finasteride. Patient stated that he is out of refills. Refills sent to pharmacy electronically per patient's request.  Patient notified by telephone that scripts have been sent in.

## 2016-11-05 ENCOUNTER — Ambulatory Visit (INDEPENDENT_AMBULATORY_CARE_PROVIDER_SITE_OTHER): Payer: 59 | Admitting: Family Medicine

## 2016-11-05 ENCOUNTER — Encounter (INDEPENDENT_AMBULATORY_CARE_PROVIDER_SITE_OTHER): Payer: Self-pay

## 2016-11-05 ENCOUNTER — Encounter: Payer: Self-pay | Admitting: Family Medicine

## 2016-11-05 VITALS — BP 140/80 | HR 73 | Temp 97.6°F | Wt 244.0 lb

## 2016-11-05 DIAGNOSIS — L570 Actinic keratosis: Secondary | ICD-10-CM

## 2016-11-05 DIAGNOSIS — E119 Type 2 diabetes mellitus without complications: Secondary | ICD-10-CM | POA: Diagnosis not present

## 2016-11-05 DIAGNOSIS — E559 Vitamin D deficiency, unspecified: Secondary | ICD-10-CM | POA: Diagnosis not present

## 2016-11-05 DIAGNOSIS — R5383 Other fatigue: Secondary | ICD-10-CM | POA: Diagnosis not present

## 2016-11-05 DIAGNOSIS — Z23 Encounter for immunization: Secondary | ICD-10-CM

## 2016-11-05 DIAGNOSIS — I10 Essential (primary) hypertension: Secondary | ICD-10-CM | POA: Diagnosis not present

## 2016-11-05 DIAGNOSIS — E785 Hyperlipidemia, unspecified: Secondary | ICD-10-CM

## 2016-11-05 DIAGNOSIS — K760 Fatty (change of) liver, not elsewhere classified: Secondary | ICD-10-CM | POA: Diagnosis not present

## 2016-11-05 LAB — CBC WITH DIFFERENTIAL/PLATELET
BASOS PCT: 0.7 % (ref 0.0–3.0)
Basophils Absolute: 0.1 10*3/uL (ref 0.0–0.1)
EOS ABS: 0.1 10*3/uL (ref 0.0–0.7)
Eosinophils Relative: 1.3 % (ref 0.0–5.0)
HCT: 43.2 % (ref 39.0–52.0)
HEMOGLOBIN: 14.6 g/dL (ref 13.0–17.0)
LYMPHS ABS: 1.9 10*3/uL (ref 0.7–4.0)
Lymphocytes Relative: 23.6 % (ref 12.0–46.0)
MCHC: 33.7 g/dL (ref 30.0–36.0)
MCV: 94.8 fl (ref 78.0–100.0)
MONO ABS: 0.5 10*3/uL (ref 0.1–1.0)
Monocytes Relative: 6.8 % (ref 3.0–12.0)
NEUTROS PCT: 67.6 % (ref 43.0–77.0)
Neutro Abs: 5.4 10*3/uL (ref 1.4–7.7)
PLATELETS: 219 10*3/uL (ref 150.0–400.0)
RBC: 4.56 Mil/uL (ref 4.22–5.81)
RDW: 13.4 % (ref 11.5–15.5)
WBC: 8 10*3/uL (ref 4.0–10.5)

## 2016-11-05 LAB — COMPREHENSIVE METABOLIC PANEL
ALBUMIN: 4.3 g/dL (ref 3.5–5.2)
ALT: 51 U/L (ref 0–53)
AST: 32 U/L (ref 0–37)
Alkaline Phosphatase: 44 U/L (ref 39–117)
BUN: 18 mg/dL (ref 6–23)
CHLORIDE: 101 meq/L (ref 96–112)
CO2: 27 meq/L (ref 19–32)
CREATININE: 0.92 mg/dL (ref 0.40–1.50)
Calcium: 9.7 mg/dL (ref 8.4–10.5)
GFR: 89.44 mL/min (ref >60.00)
GLUCOSE: 101 mg/dL — AB (ref 70–99)
POTASSIUM: 3.8 meq/L (ref 3.5–5.1)
Sodium: 138 mEq/L (ref 135–145)
Total Bilirubin: 0.5 mg/dL (ref 0.2–1.2)
Total Protein: 7.1 g/dL (ref 6.0–8.3)

## 2016-11-05 LAB — VITAMIN B12: Vitamin B-12: 378 pg/mL (ref 211–911)

## 2016-11-05 LAB — VITAMIN D 25 HYDROXY (VIT D DEFICIENCY, FRACTURES): VITD: 42.27 ng/mL (ref 30.00–100.00)

## 2016-11-05 LAB — TSH: TSH: 1.64 u[IU]/mL (ref 0.35–4.50)

## 2016-11-05 LAB — HEMOGLOBIN A1C: Hgb A1c MFr Bld: 6.2 % (ref 4.6–6.5)

## 2016-11-05 NOTE — Assessment & Plan Note (Signed)
Chronic, stable. Continue current regimen. 

## 2016-11-05 NOTE — Progress Notes (Signed)
BP 140/80 (BP Location: Right Arm, Cuff Size: Large)   Pulse 73   Temp 97.6 F (36.4 C) (Oral)   Wt 244 lb (110.7 kg)   SpO2 96%   BMI 37.10 kg/m    CC: 4 mo f/u vist Subjective:    Patient ID: Erik Allen, male    DOB: 02/03/1956, 61 y.o.   MRN: [id number]  HPI: CIRE CLUTE is a 61 y.o. male presenting on 11/05/2016 for 4 mo follow-up   DM - does not regularly check sugars. Fasting today. Compliant with antihyperglycemic regimen which includes: metformin 500mg  bid. Denies low sugars or hypoglycemic symptoms. Denies paresthesias. Last diabetic eye exam DUE - will schedule appt. Pneumovax: 2014. Prevnar: not due. Glucometer brand: doesn't have one. DSME: has not completed. Lab Results  Component Value Date   HGBA1C 6.4 07/03/2016   Diabetic Foot Exam - Simple   Simple Foot Form Diabetic Foot exam was performed with the following findings:  Yes 11/05/2016  8:44 AM  Visual Inspection No deformities, no ulcerations, no other skin breakdown bilaterally:  Yes Sensation Testing Intact to touch and monofilament testing bilaterally:  Yes Pulse Check Posterior Tibialis and Dorsalis pulse intact bilaterally:  Yes Comments Slightly diminished pulses    Lab Results  Component Value Date   MICROALBUR 1.3 12/27/2011     HTN - compliant with losartan hctz 100/25mg  daily. No HA, vision changes, CP/tightness, SOB, leg swelling. No low BP sxs of dizziness or fatigue.   HLD - compliant with simvastatin 80mg  daily without myalgias.   Ongoing fatigue - more noticeable after 11am. Tried 5 hr enregy drinks. No daytime somnolence. More marked fatigue. Endorses restorative sleep, averages 7 hours. Prior ESS = 2.   Relevant past medical, surgical, family and social history reviewed and updated as indicated. Interim medical history since our last visit reviewed. Allergies and medications reviewed and updated. Outpatient Medications Prior to Visit  Medication Sig Dispense Refill  .  albuterol (PROAIR HFA) 108 (90 Base) MCG/ACT inhaler Inhale 2 puffs into the lungs every 6 (six) hours as needed for wheezing. 1 Inhaler 11  . albuterol (PROVENTIL) (2.5 MG/3ML) 0.083% nebulizer solution Take 3 mLs (2.5 mg total) by nebulization every 6 (six) hours as needed for wheezing or shortness of breath. 150 mL 1  . cholecalciferol (VITAMIN D) 1000 units tablet Take 2,000 Units by mouth daily.    Marland Kitchen dexlansoprazole (DEXILANT) 60 MG capsule Take 60 mg by mouth daily.    . finasteride (PROSCAR) 5 MG tablet Take 1 tablet (5 mg total) by mouth daily. 90 tablet 1  . Fluticasone-Salmeterol (ADVAIR DISKUS) 250-50 MCG/DOSE AEPB INHALE 1 DOSE BY MOUTH TWICE DAILY. RINSE MOUTH AFTER USE 60 each 5  . losartan-hydrochlorothiazide (HYZAAR) 100-25 MG tablet TAKE 1 TABLET BY MOUTH DAILY. 90 tablet 3  . metFORMIN (GLUCOPHAGE) 500 MG tablet Take 1 tablet (500 mg total) by mouth 2 (two) times daily with a meal. 90 tablet 3  . montelukast (SINGULAIR) 10 MG tablet TAKE 1 TABLET BY MOUTH AT BEDTIME 30 tablet 5  . simvastatin (ZOCOR) 80 MG tablet TAKE 1 TABLET BY MOUTH AT BEDTIME 90 tablet 1  . ibuprofen (ADVIL,MOTRIN) 800 MG tablet Take 800 mg by mouth every 8 (eight) hours as needed.    Marland Kitchen oxyCODONE-acetaminophen (ROXICET) 5-325 MG tablet Take 1 tablet by mouth every 6 (six) hours as needed. 20 tablet 0   No facility-administered medications prior to visit.      Per HPI unless specifically  indicated in ROS section below Review of Systems     Objective:    BP 140/80 (BP Location: Right Arm, Cuff Size: Large)   Pulse 73   Temp 97.6 F (36.4 C) (Oral)   Wt 244 lb (110.7 kg)   SpO2 96%   BMI 37.10 kg/m   Wt Readings from Last 3 Encounters:  11/05/16 244 lb (110.7 kg)  08/02/16 240 lb (108.9 kg)  07/06/16 242 lb 4 oz (109.9 kg)    Physical Exam  Constitutional: He appears well-developed and well-nourished. No distress.  HENT:  Head: Normocephalic and atraumatic.  Right Ear: External ear normal.    Left Ear: External ear normal.  Nose: Nose normal.  Mouth/Throat: Oropharynx is clear and moist. No oropharyngeal exudate.  Eyes: Pupils are equal, round, and reactive to light. Conjunctivae and EOM are normal. No scleral icterus.  Neck: Normal range of motion. Neck supple.  Cardiovascular: Normal rate, regular rhythm, normal heart sounds and intact distal pulses.   No murmur heard. Pulmonary/Chest: Effort normal and breath sounds normal. No respiratory distress. He has no wheezes. He has no rales.  Musculoskeletal: He exhibits no edema.  See HPI for foot exam if done  Lymphadenopathy:    He has no cervical adenopathy.  Skin: Skin is warm and dry. No rash noted.  Psychiatric: He has a normal mood and affect.  Nursing note and vitals reviewed.     Assessment & Plan:   Problem List Items Addressed This Visit    Actinic keratosis    Saw derm - s/p facial peel.       Diet-controlled diabetes mellitus ([facility]) - Primary    Chronic, stable. Update A1c on metformin BID.       Relevant Orders   Hemoglobin A1c   Essential hypertension    Chronic, stable. Continue current regimen.       Relevant Orders   Comprehensive metabolic panel   Fatigue    Update labs. Low ESS previously however body habitus puts him at risk. Discussed considering sleep study if labwork unrevealing.       Relevant Orders   TSH   CBC with Differential/Platelet   Vitamin B12   Fatty liver disease, nonalcoholic    Update LFTs      HLD (hyperlipidemia)   Relevant Orders   Comprehensive metabolic panel   Vitamin D deficiency   Relevant Orders   VITAMIN D 25 Hydroxy (Vit-D Deficiency, Fractures)    Other Visit Diagnoses    Need for influenza vaccination       Relevant Orders   Flu Vaccine QUAD 6+ mos PF IM (Fluarix Quad PF) (Completed)       Follow up plan: Return in about 6 months (around 05/05/2017) for follow up visit.  Ria Bush, MD

## 2016-11-05 NOTE — Assessment & Plan Note (Signed)
Update labs. Low ESS previously however body habitus puts him at risk. Discussed considering sleep study if labwork unrevealing.

## 2016-11-05 NOTE — Patient Instructions (Addendum)
Schedule eye doctor appointment.  You are doing well today.  Return as needed or in 4 months for follow up visit.  Labs today.

## 2016-11-05 NOTE — Assessment & Plan Note (Signed)
Update LFT's 

## 2016-11-05 NOTE — Assessment & Plan Note (Signed)
Saw derm - s/p facial peel.

## 2016-11-05 NOTE — Assessment & Plan Note (Signed)
Chronic, stable. Update A1c on metformin BID.

## 2016-11-06 ENCOUNTER — Other Ambulatory Visit: Payer: Self-pay | Admitting: Family Medicine

## 2016-11-06 ENCOUNTER — Ambulatory Visit: Payer: 59 | Admitting: Family Medicine

## 2016-11-06 MED ORDER — CYANOCOBALAMIN 500 MCG PO TABS: 500.0000 ug | ORAL_TABLET | Freq: Every day | ORAL | Status: DC

## 2016-12-08 ENCOUNTER — Other Ambulatory Visit: Payer: Self-pay | Admitting: Family Medicine

## 2016-12-14 DIAGNOSIS — K219 Gastro-esophageal reflux disease without esophagitis: Secondary | ICD-10-CM | POA: Diagnosis not present

## 2017-01-05 ENCOUNTER — Other Ambulatory Visit: Payer: Self-pay | Admitting: Family Medicine

## 2017-01-12 LAB — HM DIABETES EYE EXAM

## 2017-01-15 DIAGNOSIS — L570 Actinic keratosis: Secondary | ICD-10-CM | POA: Diagnosis not present

## 2017-02-01 ENCOUNTER — Other Ambulatory Visit: Payer: Self-pay | Admitting: Family Medicine

## 2017-03-07 ENCOUNTER — Ambulatory Visit (INDEPENDENT_AMBULATORY_CARE_PROVIDER_SITE_OTHER): Payer: 59 | Admitting: Family Medicine

## 2017-03-07 ENCOUNTER — Ambulatory Visit (INDEPENDENT_AMBULATORY_CARE_PROVIDER_SITE_OTHER)
Admission: RE | Admit: 2017-03-07 | Discharge: 2017-03-07 | Disposition: A | Discharge status: Home / Self Care | Payer: 59 | Source: Ambulatory Visit | Attending: Family Medicine | Admitting: Family Medicine

## 2017-03-07 ENCOUNTER — Encounter: Payer: Self-pay | Admitting: Family Medicine

## 2017-03-07 VITALS — BP 136/82 | HR 99 | Temp 98.2°F | Wt 243.0 lb

## 2017-03-07 DIAGNOSIS — R21 Rash and other nonspecific skin eruption: Secondary | ICD-10-CM | POA: Diagnosis not present

## 2017-03-07 DIAGNOSIS — J209 Acute bronchitis, unspecified: Secondary | ICD-10-CM

## 2017-03-07 DIAGNOSIS — J45909 Unspecified asthma, uncomplicated: Secondary | ICD-10-CM

## 2017-03-07 DIAGNOSIS — M545 Low back pain: Secondary | ICD-10-CM | POA: Diagnosis not present

## 2017-03-07 DIAGNOSIS — M5416 Radiculopathy, lumbar region: Secondary | ICD-10-CM | POA: Diagnosis not present

## 2017-03-07 DIAGNOSIS — J4540 Moderate persistent asthma, uncomplicated: Secondary | ICD-10-CM | POA: Diagnosis not present

## 2017-03-07 DIAGNOSIS — E119 Type 2 diabetes mellitus without complications: Secondary | ICD-10-CM | POA: Diagnosis not present

## 2017-03-07 MED ORDER — FLUTICASONE-SALMETEROL 250-50 MCG/DOSE IN AEPB: INHALATION_SPRAY | RESPIRATORY_TRACT | 11 refills | Status: DC

## 2017-03-07 MED ORDER — MUPIROCIN CALCIUM 2 % EX CREA: 1.0000 "application " | TOPICAL_CREAM | Freq: Two times a day (BID) | CUTANEOUS | 0 refills | Status: DC

## 2017-03-07 MED ORDER — PREDNISONE 20 MG PO TABS: ORAL_TABLET | ORAL | 0 refills | Status: DC

## 2017-03-07 MED ORDER — AZITHROMYCIN 250 MG PO TABS: ORAL_TABLET | ORAL | 0 refills | Status: DC

## 2017-03-07 NOTE — Assessment & Plan Note (Addendum)
Nasal skin rash cold sore vs impetigo. Rx mupirocin, update if not improving.

## 2017-03-07 NOTE — Assessment & Plan Note (Signed)
Chronic, stable. Does not check sugars. Will defer labs today. UTD eye exam. Chart updated.

## 2017-03-07 NOTE — Assessment & Plan Note (Signed)
advair refilled

## 2017-03-07 NOTE — Patient Instructions (Addendum)
Lumbar films today. We will refer you to physical therapy for back pain.  For cough - treat with prednisone course and zpack antibiotic. Continue mucinex. Let us know if not improving with treatment.  No labs today.  Try mupirocin antibiotic cream to nose. Return in 4 months for physical

## 2017-03-07 NOTE — Assessment & Plan Note (Signed)
Treat with zpack and prednisone taper. Update if not improving with treatment.

## 2017-03-07 NOTE — Assessment & Plan Note (Signed)
Worsening pain recently. Latest MRI 2015 reviewed with patient. Discussed weight contribution. Will update lumbar films and refer back to PT. No red flags.

## 2017-03-07 NOTE — Progress Notes (Signed)
BP 136/82 (BP Location: Left Arm, Patient Position: Sitting, Cuff Size: Normal)   Pulse 99   Temp 98.2 F (36.8 C) (Oral)   Wt 243 lb (110.2 kg)   SpO2 94%   BMI 36.95 kg/m    CC: 4 mo f/u visit Subjective:    Patient ID: Erik Allen, male    DOB: [birth year], 62 y.o.   MRN: [id number]  HPI: SIR MALLIS is a 62 y.o. male presenting on 03/07/2017 for 4 mo follow-up; Nasal Congestion (Also, head congestion. Concerned it is moving to his chest. Started 03/07/17. Tried Mucinex); and Back Pain (Lower back pain, worsened. Starting to effect bilateral hips. Has seen PT)   1 wk h/o nasal congestion, cough with wheeze and dyspnea with coughing fits. blowing nose so much has led to raw skin below L nostril. Head and chest congestion. Cough occasionally productive. He did have fever initially but that has broke. Treating with mucinex and albuterol nebulizer (known asthma). He regularly takes his advair. Has tried OTC remedies as well (cough syrups).   Ongoing lower right back pain with occasional R radiculopathy, now affecting bilateral hips (burning pain). MRI 2015 showing R foraminal stenosis L3/4 and L4/5 which could affect L3/L4 nerve roots as well as spondylolisthesis with severe facet arthritis L4/5. PT at that time did help. Worse with prolonged walking - decreased stamina and has to stop and rest when he's walking into warehouse or out to lunch. No numbness/weakness of legs. No calf pain.   Fatigue - labwork largely unrevealing except for low B12 - he started b12 534mcg daily without improvement.   DM - does not regularly check sugars. Compliant with antihyperglycemic regimen which includes: metformin 500mg  bid. Denies low sugars or hypoglycemic symptoms. Denies paresthesias. Last diabetic eye exam done 01/2017 I never received report. Pneumovax: 2014. Prevnar: not due. Glucometer brand: doesn't have one. DSME: has not completed. Lab Results  Component Value Date   HGBA1C 6.2  11/05/2016   Diabetic Foot Exam - Simple   No data filed    done last visit.  Lab Results  Component Value Date   MICROALBUR 1.3 12/27/2011     Relevant past medical, surgical, family and social history reviewed and updated as indicated. Interim medical history since our last visit reviewed. Allergies and medications reviewed and updated. Outpatient Medications Prior to Visit  Medication Sig Dispense Refill  . albuterol (PROAIR HFA) 108 (90 Base) MCG/ACT inhaler Inhale 2 puffs into the lungs every 6 (six) hours as needed for wheezing. 1 Inhaler 11  . albuterol (PROVENTIL) (2.5 MG/3ML) 0.083% nebulizer solution Take 3 mLs (2.5 mg total) by nebulization every 6 (six) hours as needed for wheezing or shortness of breath. 150 mL 1  . cholecalciferol (VITAMIN D) 1000 units tablet Take 2,000 Units by mouth daily.    . cyanocobalamin (V-R VITAMIN B-12) 500 MCG tablet Take 1 tablet (500 mcg total) by mouth daily.    Marland Kitchen dexlansoprazole (DEXILANT) 60 MG capsule Take 60 mg by mouth daily.    . finasteride (PROSCAR) 5 MG tablet Take 1 tablet (5 mg total) by mouth daily. 90 tablet 1  . losartan-hydrochlorothiazide (HYZAAR) 100-25 MG tablet TAKE 1 TABLET BY MOUTH DAILY. 90 tablet 3  . metFORMIN (GLUCOPHAGE) 500 MG tablet TAKE 1 TABLET BY MOUTH TWICE DAILY WITH A MEAL 90 tablet 1  . montelukast (SINGULAIR) 10 MG tablet TAKE 1 TABLET BY MOUTH EVERY NIGHT AT BEDTIME 30 tablet 5  . simvastatin (ZOCOR) 80 MG  tablet TAKE 1 TABLET BY MOUTH AT BEDTIME 90 tablet 1  . Fluticasone-Salmeterol (ADVAIR DISKUS) 250-50 MCG/DOSE AEPB INHALE 1 DOSE BY MOUTH TWICE DAILY. RINSE MOUTH AFTER USE 60 each 5   No facility-administered medications prior to visit.      Per HPI unless specifically indicated in ROS section below Review of Systems     Objective:    BP 136/82 (BP Location: Left Arm, Patient Position: Sitting, Cuff Size: Normal)   Pulse 99   Temp 98.2 F (36.8 C) (Oral)   Wt 243 lb (110.2 kg)   SpO2 94%    BMI 36.95 kg/m   Wt Readings from Last 3 Encounters:  03/07/17 243 lb (110.2 kg)  11/05/16 244 lb (110.7 kg)  08/02/16 240 lb (108.9 kg)    Physical Exam  Constitutional: He is oriented to person, place, and time. He appears well-developed and well-nourished. No distress.  HENT:  Head: Normocephalic and atraumatic.  Right Ear: Hearing, tympanic membrane, external ear and ear canal normal.  Left Ear: Hearing, tympanic membrane, external ear and ear canal normal.  Nose: Mucosal edema present. No rhinorrhea. Right sinus exhibits no maxillary sinus tenderness and no frontal sinus tenderness. Left sinus exhibits no maxillary sinus tenderness and no frontal sinus tenderness.    Mouth/Throat: Uvula is midline, oropharynx is clear and moist and mucous membranes are normal. No oropharyngeal exudate, posterior oropharyngeal edema, posterior oropharyngeal erythema or tonsillar abscesses.  Erosions below L nare with crusting  Eyes: Conjunctivae and EOM are normal. Pupils are equal, round, and reactive to light. No scleral icterus.  Neck: Normal range of motion. Neck supple.  Cardiovascular: Normal rate, regular rhythm and intact distal pulses.  Murmur (3/6 systolic) heard. Pulmonary/Chest: Effort normal. No respiratory distress. He has decreased breath sounds. He has no wheezes. He has rhonchi (bilaterally). He has no rales.  Coarse breath sounds  Musculoskeletal: He exhibits no edema.  See HPI for foot exam if done Mild tenderness to palpation lumbar spine midline and paraspinous lumbar mm R>L  Lymphadenopathy:    He has no cervical adenopathy.  Neurological: He is alert and oriented to person, place, and time. He has normal strength. No sensory deficit. Coordination and gait normal.  5/5 strength BLE  Skin: Skin is warm and dry. No rash noted.  Psychiatric: He has a normal mood and affect.  Nursing note and vitals reviewed.  Results for orders placed or performed in visit on 03/07/17  HM  DIABETES EYE EXAM  Result Value Ref Range   HM Diabetic Eye Exam No Retinopathy No Retinopathy      Assessment & Plan:   Problem List Items Addressed This Visit    Acute bronchitis with asthma    Treat with zpack and prednisone taper. Update if not improving with treatment.       Relevant Medications   Fluticasone-Salmeterol (ADVAIR DISKUS) 250-50 MCG/DOSE AEPB   predniSONE (DELTASONE) 20 MG tablet   Asthma    advair refilled.       Relevant Medications   Fluticasone-Salmeterol (ADVAIR DISKUS) 250-50 MCG/DOSE AEPB   predniSONE (DELTASONE) 20 MG tablet   Controlled type 2 diabetes mellitus without complication, without long-term current use of insulin (HCC) - Primary    Chronic, stable. Does not check sugars. Will defer labs today. UTD eye exam. Chart updated.       Lumbar back pain with radiculopathy affecting right lower extremity    Worsening pain recently. Latest MRI 2015 reviewed with patient. Discussed weight contribution. Will  update lumbar films and refer back to PT. No red flags.       Relevant Medications   predniSONE (DELTASONE) 20 MG tablet   Other Relevant Orders   DG Lumbar Spine Complete   Ambulatory referral to Physical Therapy   Skin rash    Nasal skin rash cold sore vs impetigo. Rx mupirocin, update if not improving.           Follow up plan: Return in about 4 months (around 07/05/2017) for annual exam, prior fasting for blood work.  Ria Bush, MD

## 2017-03-09 ENCOUNTER — Encounter: Payer: Self-pay | Admitting: Family Medicine

## 2017-03-09 DIAGNOSIS — M8580 Other specified disorders of bone density and structure, unspecified site: Secondary | ICD-10-CM | POA: Insufficient documentation

## 2017-03-14 DIAGNOSIS — M545 Low back pain: Secondary | ICD-10-CM | POA: Diagnosis not present

## 2017-03-20 DIAGNOSIS — M545 Low back pain: Secondary | ICD-10-CM | POA: Diagnosis not present

## 2017-03-28 DIAGNOSIS — M545 Low back pain: Secondary | ICD-10-CM | POA: Diagnosis not present

## 2017-04-02 DIAGNOSIS — M545 Low back pain: Secondary | ICD-10-CM | POA: Diagnosis not present

## 2017-04-03 ENCOUNTER — Other Ambulatory Visit: Payer: Self-pay | Admitting: Family Medicine

## 2017-04-05 ENCOUNTER — Other Ambulatory Visit: Payer: Self-pay | Admitting: Family Medicine

## 2017-04-05 DIAGNOSIS — M545 Low back pain: Secondary | ICD-10-CM | POA: Diagnosis not present

## 2017-04-09 DIAGNOSIS — M545 Low back pain: Secondary | ICD-10-CM | POA: Diagnosis not present

## 2017-04-11 DIAGNOSIS — M545 Low back pain: Secondary | ICD-10-CM | POA: Diagnosis not present

## 2017-04-16 DIAGNOSIS — M545 Low back pain: Secondary | ICD-10-CM | POA: Diagnosis not present

## 2017-04-27 ENCOUNTER — Other Ambulatory Visit: Payer: Self-pay | Admitting: Family Medicine

## 2017-05-01 ENCOUNTER — Other Ambulatory Visit: Payer: Self-pay | Admitting: Family Medicine

## 2017-05-16 ENCOUNTER — Other Ambulatory Visit: Payer: Self-pay | Admitting: Family Medicine

## 2017-05-17 DIAGNOSIS — D225 Melanocytic nevi of trunk: Secondary | ICD-10-CM | POA: Diagnosis not present

## 2017-05-17 DIAGNOSIS — X32XXXA Exposure to sunlight, initial encounter: Secondary | ICD-10-CM | POA: Diagnosis not present

## 2017-05-17 DIAGNOSIS — L570 Actinic keratosis: Secondary | ICD-10-CM | POA: Diagnosis not present

## 2017-05-17 DIAGNOSIS — L728 Other follicular cysts of the skin and subcutaneous tissue: Secondary | ICD-10-CM | POA: Diagnosis not present

## 2017-05-17 DIAGNOSIS — L821 Other seborrheic keratosis: Secondary | ICD-10-CM | POA: Diagnosis not present

## 2017-05-25 ENCOUNTER — Other Ambulatory Visit: Payer: Self-pay | Admitting: Family Medicine

## 2017-07-02 ENCOUNTER — Other Ambulatory Visit: Payer: Self-pay | Admitting: Family Medicine

## 2017-07-06 ENCOUNTER — Other Ambulatory Visit: Payer: Self-pay | Admitting: Family Medicine

## 2017-07-06 DIAGNOSIS — E559 Vitamin D deficiency, unspecified: Secondary | ICD-10-CM

## 2017-07-06 DIAGNOSIS — N400 Enlarged prostate without lower urinary tract symptoms: Secondary | ICD-10-CM

## 2017-07-06 DIAGNOSIS — E785 Hyperlipidemia, unspecified: Secondary | ICD-10-CM

## 2017-07-06 DIAGNOSIS — E119 Type 2 diabetes mellitus without complications: Secondary | ICD-10-CM

## 2017-07-09 ENCOUNTER — Other Ambulatory Visit (INDEPENDENT_AMBULATORY_CARE_PROVIDER_SITE_OTHER): Payer: 59

## 2017-07-09 DIAGNOSIS — E785 Hyperlipidemia, unspecified: Secondary | ICD-10-CM

## 2017-07-09 DIAGNOSIS — E559 Vitamin D deficiency, unspecified: Secondary | ICD-10-CM | POA: Diagnosis not present

## 2017-07-09 DIAGNOSIS — E119 Type 2 diabetes mellitus without complications: Secondary | ICD-10-CM | POA: Diagnosis not present

## 2017-07-09 DIAGNOSIS — N400 Enlarged prostate without lower urinary tract symptoms: Secondary | ICD-10-CM

## 2017-07-09 LAB — COMPREHENSIVE METABOLIC PANEL
ALK PHOS: 41 U/L (ref 39–117)
ALT: 53 U/L (ref 0–53)
AST: 38 U/L — ABNORMAL HIGH (ref 0–37)
Albumin: 4.1 g/dL (ref 3.5–5.2)
BUN: 24 mg/dL — AB (ref 6–23)
CHLORIDE: 102 meq/L (ref 96–112)
CO2: 28 meq/L (ref 19–32)
Calcium: 9.1 mg/dL (ref 8.4–10.5)
Creatinine, Ser: 0.93 mg/dL (ref 0.40–1.50)
GFR: 88.13 mL/min (ref >60.00)
GLUCOSE: 103 mg/dL — AB (ref 70–99)
POTASSIUM: 4.2 meq/L (ref 3.5–5.1)
Sodium: 138 mEq/L (ref 135–145)
TOTAL PROTEIN: 6.8 g/dL (ref 6.0–8.3)
Total Bilirubin: 0.3 mg/dL (ref 0.2–1.2)

## 2017-07-09 LAB — LIPID PANEL
CHOL/HDL RATIO: 3
Cholesterol: 172 mg/dL (ref 0–200)
HDL: 53.3 mg/dL (ref >39.00)
NONHDL: 118.56
TRIGLYCERIDES: 235 mg/dL — AB (ref 0.0–149.0)
VLDL: 47 mg/dL — ABNORMAL HIGH (ref 0.0–40.0)

## 2017-07-09 LAB — HEMOGLOBIN A1C: Hgb A1c MFr Bld: 6.5 % (ref 4.6–6.5)

## 2017-07-09 LAB — MICROALBUMIN / CREATININE URINE RATIO
CREATININE, U: 217.2 mg/dL
MICROALB UR: 2.6 mg/dL — AB (ref 0.0–1.9)
Microalb Creat Ratio: 1.2 mg/g (ref 0.0–30.0)

## 2017-07-09 LAB — LDL CHOLESTEROL, DIRECT: Direct LDL: 94 mg/dL

## 2017-07-09 LAB — VITAMIN D 25 HYDROXY (VIT D DEFICIENCY, FRACTURES): VITD: 45.19 ng/mL (ref 30.00–100.00)

## 2017-07-09 LAB — PSA: PSA: 0.15 ng/mL (ref 0.10–4.00)

## 2017-07-12 ENCOUNTER — Ambulatory Visit (INDEPENDENT_AMBULATORY_CARE_PROVIDER_SITE_OTHER): Payer: 59 | Admitting: Family Medicine

## 2017-07-12 ENCOUNTER — Encounter: Payer: Self-pay | Admitting: Family Medicine

## 2017-07-12 ENCOUNTER — Other Ambulatory Visit: Payer: Self-pay | Admitting: Family Medicine

## 2017-07-12 VITALS — BP 134/82 | HR 98 | Temp 98.4°F | Ht 68.0 in | Wt 243.5 lb

## 2017-07-12 DIAGNOSIS — E8881 Metabolic syndrome: Secondary | ICD-10-CM

## 2017-07-12 DIAGNOSIS — R011 Cardiac murmur, unspecified: Secondary | ICD-10-CM

## 2017-07-12 DIAGNOSIS — K219 Gastro-esophageal reflux disease without esophagitis: Secondary | ICD-10-CM

## 2017-07-12 DIAGNOSIS — I10 Essential (primary) hypertension: Secondary | ICD-10-CM

## 2017-07-12 DIAGNOSIS — R5383 Other fatigue: Secondary | ICD-10-CM | POA: Diagnosis not present

## 2017-07-12 DIAGNOSIS — N400 Enlarged prostate without lower urinary tract symptoms: Secondary | ICD-10-CM | POA: Diagnosis not present

## 2017-07-12 DIAGNOSIS — K760 Fatty (change of) liver, not elsewhere classified: Secondary | ICD-10-CM

## 2017-07-12 DIAGNOSIS — Z0000 Encounter for general adult medical examination without abnormal findings: Secondary | ICD-10-CM | POA: Diagnosis not present

## 2017-07-12 DIAGNOSIS — E119 Type 2 diabetes mellitus without complications: Secondary | ICD-10-CM | POA: Diagnosis not present

## 2017-07-12 DIAGNOSIS — E785 Hyperlipidemia, unspecified: Secondary | ICD-10-CM

## 2017-07-12 DIAGNOSIS — J4540 Moderate persistent asthma, uncomplicated: Secondary | ICD-10-CM

## 2017-07-12 MED ORDER — VENTOLIN HFA 108 (90 BASE) MCG/ACT IN AERS: 2.0000 | INHALATION_SPRAY | Freq: Four times a day (QID) | RESPIRATORY_TRACT | 11 refills | Status: DC | PRN

## 2017-07-12 NOTE — Assessment & Plan Note (Signed)
Chronic, stable 

## 2017-07-12 NOTE — Progress Notes (Signed)
BP 134/82 (BP Location: Left Arm, Patient Position: Sitting, Cuff Size: Large)   Pulse 98   Temp 98.4 F (36.9 C) (Oral)   Ht 5\' 8"  (1.727 m)   Wt 243 lb 8 oz (110.5 kg)   SpO2 96%   BMI 37.02 kg/m    CC: CPE Subjective:    Patient ID: Erik Allen, male    DOB: [birth year], 62 y.o.   MRN: [id number]  HPI: Erik Allen is a 62 y.o. male presenting on 07/12/2017 for Annual Exam (States ProAir is not working.)   Asthma - increased dyspnea with humidity. Proair HFA not as effective as ventolin. Requests ventolin refilled.   Ongoing fatigue, low energy. No snoring, only sometimes feels rested when he wakes up, no significant daytime somnolence endorsed. He has started dissolvable b12 560mcg without much benefit.  Preventative: Colonsocopy - 2010 WNL, rpt due 10 yrs. ENT has suggested he get EGD.  Prostate - yearly screen, normal. Not strong stream but "good stream", nocturia x1-3.Known BPH on proscar.  Flu shot yearly Pneumovax 2014 Tdap 2016 Seat belt use discussed Sunscreen use discussed, no changing moles on skin. Uses UV protection shirts.  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 Interim medical history since our last visit reviewed. Allergies and medications reviewed and updated. Outpatient Medications Prior to Visit  Medication Sig Dispense Refill  . albuterol (PROVENTIL) (2.5 MG/3ML) 0.083% nebulizer solution Take 3 mLs (2.5 mg total) by nebulization every 6 (six) hours as needed for wheezing or shortness of breath. 150 mL 1  . cholecalciferol (VITAMIN D) 1000 units tablet Take 2,000 Units by mouth daily.    .  cyanocobalamin (V-R VITAMIN B-12) 500 MCG tablet Take 1 tablet (500 mcg total) by mouth daily.    Marland Kitchen dexlansoprazole (DEXILANT) 60 MG capsule Take 60 mg by mouth daily.    . finasteride (PROSCAR) 5 MG tablet TAKE 1 TABLET(5 MG) BY MOUTH DAILY 90 tablet 0  . Fluticasone-Salmeterol (ADVAIR DISKUS) 250-50 MCG/DOSE AEPB INHALE 1 DOSE BY MOUTH TWICE DAILY. RINSE MOUTH AFTER USE 60 each 11  . losartan-hydrochlorothiazide (HYZAAR) 100-25 MG tablet TAKE 1 TABLET BY MOUTH DAILY. 90 tablet 3  . metFORMIN (GLUCOPHAGE) 500 MG tablet TAKE 1 TABLET BY MOUTH TWICE DAILY WITH MEALS 180 tablet 1  . montelukast (SINGULAIR) 10 MG tablet TAKE 1 TABLET BY MOUTH EVERY NIGHT AT BEDTIME 90 tablet 0  . mupirocin cream (BACTROBAN) 2 % Apply 1 application topically 2 (two) times daily. 15 g 0  . simvastatin (ZOCOR) 80 MG tablet TAKE 1 TABLET BY MOUTH AT BEDTIME 90 tablet 0  . albuterol (PROAIR HFA) 108 (90 Base) MCG/ACT inhaler Inhale 2 puffs into the lungs every 6 (six) hours as needed for wheezing. 1 Inhaler 11  . azithromycin (ZITHROMAX) 250 MG tablet Take two tablets on day one followed by one tablet on days 2-5 6 each 0  . predniSONE (DELTASONE) 20 MG tablet Take two tablets daily for 3 days followed by one tablet daily for 4 days 10 tablet 0   No facility-administered medications prior to visit.      Per HPI unless specifically indicated in ROS section  below Review of Systems  Constitutional: Negative for activity change, appetite change, chills, fatigue, fever and unexpected weight change.  HENT: Positive for congestion and sinus pressure. Negative for hearing loss.   Eyes: Negative for visual disturbance.  Respiratory: Positive for cough, shortness of breath (with heat) and wheezing. Negative for chest tightness.   Cardiovascular: Negative for chest pain, palpitations and leg swelling.  Gastrointestinal: Negative for abdominal distention, abdominal pain, blood in stool, constipation, diarrhea, nausea and  vomiting.  Genitourinary: Negative for difficulty urinating and hematuria.  Musculoskeletal: Negative for arthralgias, myalgias and neck pain.  Skin: Negative for rash.  Neurological: Positive for dizziness (vertigo x2, treated with dramamine). Negative for seizures, syncope and headaches.  Hematological: Negative for adenopathy. Does not bruise/bleed easily.  Psychiatric/Behavioral: Negative for dysphoric mood. The patient is not nervous/anxious.        Objective:    BP 134/82 (BP Location: Left Arm, Patient Position: Sitting, Cuff Size: Large)   Pulse 98   Temp 98.4 F (36.9 C) (Oral)   Ht 5\' 8"  (1.727 m)   Wt 243 lb 8 oz (110.5 kg)   SpO2 96%   BMI 37.02 kg/m   Wt Readings from Last 3 Encounters:  07/12/17 243 lb 8 oz (110.5 kg)  03/07/17 243 lb (110.2 kg)  11/05/16 244 lb (110.7 kg)    Physical Exam  Constitutional: He is oriented to person, place, and time. He appears well-developed and well-nourished. No distress.  HENT:  Head: Normocephalic and atraumatic.  Right Ear: Hearing, tympanic membrane, external ear and ear canal normal.  Left Ear: Hearing, tympanic membrane, external ear and ear canal normal.  Nose: Nose normal.  Mouth/Throat: Uvula is midline, oropharynx is clear and moist and mucous membranes are normal. No oropharyngeal exudate, posterior oropharyngeal edema or posterior oropharyngeal erythema.  Eyes: Pupils are equal, round, and reactive to light. Conjunctivae and EOM are normal. No scleral icterus.  Neck: Normal range of motion. Neck supple. No thyromegaly present.  Cardiovascular: Normal rate, regular rhythm and intact distal pulses.  Murmur (3/6 systolic) heard. Pulses:      Radial pulses are 2+ on the right side, and 2+ on the left side.  Pulmonary/Chest: Effort normal and breath sounds normal. No respiratory distress. He has no wheezes. He has no rales.  Abdominal: Soft. Bowel sounds are normal. He exhibits no distension and no mass. There is no  tenderness. There is no rebound and no guarding.  Genitourinary: Rectum normal and prostate normal. Rectal exam shows no external hemorrhoid, no internal hemorrhoid, no fissure, no mass, no tenderness and anal tone normal. Prostate is not enlarged (10gm) and not tender.  Musculoskeletal: Normal range of motion. He exhibits no edema.  Lymphadenopathy:    He has no cervical adenopathy.  Neurological: He is alert and oriented to person, place, and time.  CN grossly intact, station and gait intact  Skin: Skin is warm and dry. No rash noted.  Psychiatric: He has a normal mood and affect. His behavior is normal. Judgment and thought content normal.  Nursing note and vitals reviewed.  Results for orders placed or performed in visit on 07/09/17  Microalbumin / creatinine urine ratio  Result Value Ref Range   Microalb, Ur 2.6 (H) 0.0 - 1.9 mg/dL   Creatinine,U 217.2 mg/dL   Microalb Creat Ratio 1.2 0.0 - 30.0 mg/g  VITAMIN D 25 Hydroxy (Vit-D Deficiency, Fractures)  Result Value Ref Range   VITD 45.19 30.00 - 100.00 ng/mL  PSA  Result Value  Ref Range   PSA 0.15 0.10 - 4.00 ng/mL  Hemoglobin A1c  Result Value Ref Range   Hgb A1c MFr Bld 6.5 4.6 - 6.5 %  Lipid panel  Result Value Ref Range   Cholesterol 172 0 - 200 mg/dL   Triglycerides 235.0 (H) 0.0 - 149.0 mg/dL   HDL 53.30 >39.00 mg/dL   VLDL 47.0 (H) 0.0 - 40.0 mg/dL   Total CHOL/HDL Ratio 3    NonHDL 118.56   Comprehensive metabolic panel  Result Value Ref Range   Sodium 138 135 - 145 mEq/L   Potassium 4.2 3.5 - 5.1 mEq/L   Chloride 102 96 - 112 mEq/L   CO2 28 19 - 32 mEq/L   Glucose, Bld 103 (H) 70 - 99 mg/dL   BUN 24 (H) 6 - 23 mg/dL   Creatinine, Ser 0.93 0.40 - 1.50 mg/dL   Total Bilirubin 0.3 0.2 - 1.2 mg/dL   Alkaline Phosphatase 41 39 - 117 U/L   AST 38 (H) 0 - 37 U/L   ALT 53 0 - 53 U/L   Total Protein 6.8 6.0 - 8.3 g/dL   Albumin 4.1 3.5 - 5.2 g/dL   Calcium 9.1 8.4 - 10.5 mg/dL   GFR 88.13 >60.00 mL/min  LDL  cholesterol, direct  Result Value Ref Range   Direct LDL 94.0 mg/dL      Assessment & Plan:   Problem List Items Addressed This Visit    Asthma    Ventolin Rx DAW. proair ineffective. Discussed avoiding heat/humidity      Relevant Medications   VENTOLIN HFA 108 (90 Base) MCG/ACT inhaler   Benign prostatic hyperplasia    Benign exam. Mild. Continue finasteride.       Controlled type 2 diabetes mellitus without complication, without long-term current use of insulin (HCC)    Chronic, stable. Continue metformin. Recently started b12 OTC.       Essential hypertension    Chronic, stable. Continue current regimen.       Fatigue    Not improved despite starting dissolvable b12 tablets (low normal B12 levels last visit). Suggested sleep evaluation - he will consider.       Fatty liver disease, nonalcoholic    LFTs stable.       GERD    Continue dexilant daily. Followed by ENT.  Pt states ENT recommended EGD next year.       Healthcare maintenance - Primary    Preventative protocols reviewed and updated unless pt declined. Discussed healthy diet and lifestyle.       HLD (hyperlipidemia)    Chronic, stable. Triglycerides elevated. Continue simvastatin. Encouraged fatty fish in diet. The 10-year ASCVD risk score Mikey Bussing DC Brooke Bonito., et al., 2013) is: 15.4%   Values used to calculate the score:     Age: 26 years     Sex: Male     Is Non-Hispanic African American: No     Diabetic: Yes     Tobacco smoker: No     Systolic Blood Pressure: 099 mmHg     Is BP treated: Yes     HDL Cholesterol: 53.3 mg/dL     Total Cholesterol: 172 mg/dL       Metabolic syndrome    Central obesity, elevated triglycerides, diabetes.       Severe obesity (BMI 35.0-39.9) with comorbidity ([place])    Continue to encourage healthy diet and lifestyle changes to affect sustainable weight loss      Systolic murmur  Chronic, stable.           Meds ordered this encounter  Medications  . VENTOLIN  HFA 108 (90 Base) MCG/ACT inhaler    Sig: Inhale 2 puffs into the lungs every 6 (six) hours as needed for wheezing or shortness of breath.    Dispense:  18 g    Refill:  11    proair not effective   No orders of the defined types were placed in this encounter.   Follow up plan: Return in about 6 months (around 01/11/2018) for follow up visit.  Ria Bush, MD

## 2017-07-12 NOTE — Assessment & Plan Note (Signed)
LFTs stable.

## 2017-07-12 NOTE — Patient Instructions (Addendum)
Ventolin inhaler sent to pharmacy You are doing well today Continue current medicines. Consider sleep evaluation.  Return in 6 months for follow up visit.   Health Maintenance, Male A healthy lifestyle and preventive care is important for your health and wellness. Ask your health care provider about what schedule of regular examinations is right for you. What should I know about weight and diet? Eat a Healthy Diet  Eat plenty of vegetables, fruits, whole grains, low-fat dairy products, and lean protein.  Do not eat a lot of foods high in solid fats, added sugars, or salt.  Maintain a Healthy Weight Regular exercise can help you achieve or maintain a healthy weight. You should:  Do at least 150 minutes of exercise each week. The exercise should increase your heart rate and make you sweat (moderate-intensity exercise).  Do strength-training exercises at least twice a week.  Watch Your Levels of Cholesterol and Blood Lipids  Have your blood tested for lipids and cholesterol every 5 years starting at 60 years of age. If you are at high risk for heart disease, you should start having your blood tested when you are 60 years old. You may need to have your cholesterol levels checked more often if: ? Your lipid or cholesterol levels are high. ? You are older than 60 years of age. ? You are at high risk for heart disease.  What should I know about cancer screening? Many types of cancers can be detected early and may often be prevented. Lung Cancer  You should be screened every year for lung cancer if: ? You are a current smoker who has smoked for at least 30 years. ? You are a former smoker who has quit within the past 15 years.  Talk to your health care provider about your screening options, when you should start screening, and how often you should be screened.  Colorectal Cancer  Routine colorectal cancer screening usually begins at 60 years of age and should be repeated every 5-10  years until you are 60 years old. You may need to be screened more often if early forms of precancerous polyps or small growths are found. Your health care provider may recommend screening at an earlier age if you have risk factors for colon cancer.  Your health care provider may recommend using home test kits to check for hidden blood in the stool.  A small camera at the end of a tube can be used to examine your colon (sigmoidoscopy or colonoscopy). This checks for the earliest forms of colorectal cancer.  Prostate and Testicular Cancer  Depending on your age and overall health, your health care provider may do certain tests to screen for prostate and testicular cancer.  Talk to your health care provider about any symptoms or concerns you have about testicular or prostate cancer.  Skin Cancer  Check your skin from head to toe regularly.  Tell your health care provider about any new moles or changes in moles, especially if: ? There is a change in a mole's size, shape, or color. ? You have a mole that is larger than a pencil eraser.  Always use sunscreen. Apply sunscreen liberally and repeat throughout the day.  Protect yourself by wearing long sleeves, pants, a wide-brimmed hat, and sunglasses when outside.  What should I know about heart disease, diabetes, and high blood pressure?  If you are 27-69 years of age, have your blood pressure checked every 3-5 years. If you are 70 years of age or  older, have your blood pressure checked every year. You should have your blood pressure measured twice-once when you are at a hospital or clinic, and once when you are not at a hospital or clinic. Record the average of the two measurements. To check your blood pressure when you are not at a hospital or clinic, you can use: ? An automated blood pressure machine at a pharmacy. ? A home blood pressure monitor.  Talk to your health care provider about your target blood pressure.  If you are between  4-20 years old, ask your health care provider if you should take aspirin to prevent heart disease.  Have regular diabetes screenings by checking your fasting blood sugar level. ? If you are at a normal weight and have a low risk for diabetes, have this test once every three years after the age of 47. ? If you are overweight and have a high risk for diabetes, consider being tested at a younger age or more often.  A one-time screening for abdominal aortic aneurysm (AAA) by ultrasound is recommended for men aged 41-75 years who are current or former smokers. What should I know about preventing infection? Hepatitis B If you have a higher risk for hepatitis B, you should be screened for this virus. Talk with your health care provider to find out if you are at risk for hepatitis B infection. Hepatitis C Blood testing is recommended for:  Everyone born from [birth year] through 1963.  Anyone with known risk factors for hepatitis C.  Sexually Transmitted Diseases (STDs)  You should be screened each year for STDs including gonorrhea and chlamydia if: ? You are sexually active and are younger than 60 years of age. ? You are older than 60 years of age and your health care provider tells you that you are at risk for this type of infection. ? Your sexual activity has changed since you were last screened and you are at an increased risk for chlamydia or gonorrhea. Ask your health care provider if you are at risk.  Talk with your health care provider about whether you are at high risk of being infected with HIV. Your health care provider may recommend a prescription medicine to help prevent HIV infection.  What else can I do?  Schedule regular health, dental, and eye exams.  Stay current with your vaccines (immunizations).  Do not use any tobacco products, such as cigarettes, chewing tobacco, and e-cigarettes. If you need help quitting, ask your health care provider.  Limit alcohol intake to no more than  2 drinks per day. One drink equals 12 ounces of beer, 5 ounces of wine, or 1 ounces of hard liquor.  Do not use street drugs.  Do not share needles.  Ask your health care provider for help if you need support or information about quitting drugs.  Tell your health care provider if you often feel depressed.  Tell your health care provider if you have ever been abused or do not feel safe at home. This information is not intended to replace advice given to you by your health care provider. Make sure you discuss any questions you have with your health care provider. Document Released: 07/28/2007 Document Revised: 09/28/2015 Document Reviewed: 11/02/2014 Elsevier Interactive Patient Education  [employer].

## 2017-07-12 NOTE — Assessment & Plan Note (Signed)
Benign exam. Mild. Continue finasteride.

## 2017-07-12 NOTE — Assessment & Plan Note (Signed)
Ventolin Rx DAW. proair ineffective. Discussed avoiding heat/humidity

## 2017-07-12 NOTE — Assessment & Plan Note (Signed)
Central obesity, elevated triglycerides, diabetes.

## 2017-07-12 NOTE — Assessment & Plan Note (Signed)
Chronic, stable. Triglycerides elevated. Continue simvastatin. Encouraged fatty fish in diet. The 10-year ASCVD risk score Mikey Bussing DC Brooke Bonito., et al., 2013) is: 15.4%   Values used to calculate the score:     Age: 62 years     Sex: Male     Is Non-Hispanic African American: No     Diabetic: Yes     Tobacco smoker: No     Systolic Blood Pressure: 432 mmHg     Is BP treated: Yes     HDL Cholesterol: 53.3 mg/dL     Total Cholesterol: 172 mg/dL

## 2017-07-12 NOTE — Assessment & Plan Note (Signed)
Not improved despite starting dissolvable b12 tablets (low normal B12 levels last visit). Suggested sleep evaluation - he will consider.

## 2017-07-12 NOTE — Assessment & Plan Note (Signed)
Continue dexilant daily. Followed by ENT.  Pt states ENT recommended EGD next year.

## 2017-07-12 NOTE — Assessment & Plan Note (Signed)
Chronic, stable. Continue metformin. Recently started b12 OTC.

## 2017-07-12 NOTE — Assessment & Plan Note (Signed)
Chronic, stable. Continue current regimen. 

## 2017-07-12 NOTE — Assessment & Plan Note (Signed)
Preventative protocols reviewed and updated unless pt declined. Discussed healthy diet and lifestyle.  

## 2017-07-12 NOTE — Assessment & Plan Note (Signed)
Continue to encourage healthy diet and lifestyle changes to affect sustainable weight loss.  

## 2017-07-28 ENCOUNTER — Other Ambulatory Visit: Payer: Self-pay | Admitting: Family Medicine

## 2017-08-12 ENCOUNTER — Other Ambulatory Visit: Payer: Self-pay | Admitting: Family Medicine

## 2017-08-13 ENCOUNTER — Other Ambulatory Visit: Payer: Self-pay | Admitting: Family Medicine

## 2017-08-13 NOTE — Telephone Encounter (Signed)
Ok to refill? Medication is not on current medication list. Only saw Advair.  Last seen on 07/12/2017

## 2017-08-19 ENCOUNTER — Ambulatory Visit: Payer: Self-pay

## 2017-08-19 NOTE — Telephone Encounter (Signed)
Patient called in with c/o "cough, wheezing." He says "the cough started last week, but got worse on Thursday or Friday. I used my inhalers, my nebulizer for the wheezing. I am not really coughing up anything, but when it comes up, it is clear. Whenever I take a deep breath to get more air in, that's when I start coughing more. Otherwise, I'm not taking in deep breaths, wheezing and not getting a full intake of air." I asked about fever, he denies. I asked about other symptoms, he says "sinus congestion and some chest congestion." According to protocol, see PCP within 24 hours, no availability with PCP, I offered another provider, he says "I want to stick with Dr. Darnell Level." I asked the patient to hold, I called the office and spoke to [place], [facility] and asked if the Same Day slot tomorrow can be opened to schedule the patient, she says she will schedule him from her side and let the patient know to be there at 1015 tomorrow and arrive 10 minutes early for registration. Patient advised of the above, care advice given, he verbalized understanding.  Reason for Disposition . [1] Continuous (nonstop) coughing interferes with work or school AND [2] no improvement using cough treatment per protocol  Answer Assessment - Initial Assessment Questions 1. ONSET: "When did the cough begin?"      Off and on last week, got worse Friday 2. SEVERITY: "How bad is the cough today?"     Little bit, persistent cough 3. RESPIRATORY DISTRESS: "Describe your breathing."      Wheezing, not getting a full intake of air 4. FEVER: "Do you have a fever?" If so, ask: "What is your temperature, how was it measured, and when did it start?"     No 5. HEMOPTYSIS: "Are you coughing up any blood?" If so ask: "How much?" (flecks, streaks, tablespoons, etc.)     No 6. TREATMENT: "What have you done so far to treat the cough?" (e.g., meds, fluids, humidifier)     Inhalers, nebulizer, fluids 7. CARDIAC HISTORY: "Do you have any history of heart  disease?" (e.g., heart attack, congestive heart failure)      No 8. LUNG HISTORY: "Do you have any history of lung disease?"  (e.g., pulmonary embolus, asthma, emphysema)     Asthma 9. PE RISK FACTORS: "Do you have a history of blood clots?" (or: recent major surgery, recent prolonged travel, bedridden)     No 10. OTHER SYMPTOMS: "Do you have any other symptoms? (e.g., runny nose, wheezing, chest pain)       Wheezing, sinus congestion, chest congestion 11. PREGNANCY: "Is there any chance you are pregnant?" "When was your last menstrual period?"       N/A 12. TRAVEL: "Have you traveled out of the country in the last month?" (e.g., travel history, exposures)       No  Protocols used: COUGH - ACUTE NON-PRODUCTIVE-A-AH

## 2017-08-20 ENCOUNTER — Encounter: Payer: Self-pay | Admitting: Family Medicine

## 2017-08-20 ENCOUNTER — Ambulatory Visit (INDEPENDENT_AMBULATORY_CARE_PROVIDER_SITE_OTHER): Payer: 59 | Admitting: Family Medicine

## 2017-08-20 VITALS — BP 140/76 | HR 99 | Temp 98.4°F | Ht 68.0 in | Wt 247.0 lb

## 2017-08-20 DIAGNOSIS — J4541 Moderate persistent asthma with (acute) exacerbation: Secondary | ICD-10-CM

## 2017-08-20 DIAGNOSIS — J019 Acute sinusitis, unspecified: Secondary | ICD-10-CM | POA: Diagnosis not present

## 2017-08-20 MED ORDER — PREDNISONE 20 MG PO TABS: ORAL_TABLET | ORAL | 0 refills | Status: DC

## 2017-08-20 MED ORDER — AMOXICILLIN-POT CLAVULANATE 875-125 MG PO TABS: 1.0000 | ORAL_TABLET | Freq: Two times a day (BID) | ORAL | 0 refills | Status: AC

## 2017-08-20 NOTE — Patient Instructions (Signed)
I think you have asthma flare in setting of sinus infection.  Treat with scheduled albuterol (neb or inhaler) every 4-6 hours for next few days, and start prednisone taper. Take augmentin antibiotic for possible sinus infection.  Push fluids and rest.  Let us know if not improving with treatment.

## 2017-08-20 NOTE — Progress Notes (Signed)
BP 140/76 (BP Location: Left Arm, Patient Position: Sitting, Cuff Size: Large)   Pulse 99   Temp 98.4 F (36.9 C) (Oral)   Ht 5\' 8"  (1.727 m)   Wt 247 lb (112 kg)   SpO2 95%   BMI 37.56 kg/m    CC: cough Subjective:    Patient ID: Erik Allen, male    DOB: Sep 25, 1955, 62 y.o.   MRN: [id number]  HPI: Erik Allen is a 62 y.o. male presenting on 08/20/2017 for Cough (Had real bad asthma last week. Says it feels like he is spasming. Cough is off and on.)   1+ wk h/o asthma exacerbation precipitated by humidity. Coughing fits that can lead to post tussive gagging/emesis. Non productive cough. Head congestion and sinus pressure headache. Some wheezing and dyspnea. Chest congestion present as well.  Treating with albuterol neb twice daily.  Denies fevers/chills, ear or tooth pain, PNdrainage, ST.  No sick contacts at home.  Non smoker  Off this week - able to rest at home.   Known asthma treated with PRN albuterol nebs and inhaler, and daily advair 250/50 BID (now on wixela due to insurance formulary), singulair daily.   Lab Results  Component Value Date   HGBA1C 6.5 07/09/2017    Relevant past medical, surgical, family and social history reviewed and updated as indicated. Interim medical history since our last visit reviewed. Allergies and medications reviewed and updated. Outpatient Medications Prior to Visit  Medication Sig Dispense Refill  . albuterol (PROVENTIL) (2.5 MG/3ML) 0.083% nebulizer solution USE 1 VIAL VIA NEBULIZER EVERY 6 HOURS AS NEEDED 150 mL 1  . cholecalciferol (VITAMIN D) 1000 units tablet Take 2,000 Units by mouth daily.    . cyanocobalamin (V-R VITAMIN B-12) 500 MCG tablet Take 1 tablet (500 mcg total) by mouth daily.    Marland Kitchen dexlansoprazole (DEXILANT) 60 MG capsule Take 60 mg by mouth daily.    . finasteride (PROSCAR) 5 MG tablet TAKE 1 TABLET(5 MG) BY MOUTH DAILY 90 tablet 0  . losartan-hydrochlorothiazide (HYZAAR) 100-25 MG tablet TAKE 1 TABLET BY  MOUTH EVERY DAY 90 tablet 1  . metFORMIN (GLUCOPHAGE) 500 MG tablet TAKE 1 TABLET BY MOUTH TWICE DAILY WITH MEALS 180 tablet 1  . montelukast (SINGULAIR) 10 MG tablet TAKE 1 TABLET BY MOUTH EVERY NIGHT AT BEDTIME 90 tablet 0  . mupirocin cream (BACTROBAN) 2 % Apply 1 application topically 2 (two) times daily. 15 g 0  . simvastatin (ZOCOR) 80 MG tablet TAKE 1 TABLET BY MOUTH AT BEDTIME 90 tablet 1  . VENTOLIN HFA 108 (90 Base) MCG/ACT inhaler Inhale 2 puffs into the lungs every 6 (six) hours as needed for wheezing or shortness of breath. 18 g 11  . WIXELA INHUB 250-50 MCG/DOSE AEPB INHALE 1 PUFF BY MOUTH TWICE DAILY. RINSE MOUTH AFTER USE 60 each 3  . Fluticasone-Salmeterol (ADVAIR DISKUS) 250-50 MCG/DOSE AEPB INHALE 1 DOSE BY MOUTH TWICE DAILY. RINSE MOUTH AFTER USE 60 each 11   No facility-administered medications prior to visit.      Per HPI unless specifically indicated in ROS section below Review of Systems     Objective:    BP 140/76 (BP Location: Left Arm, Patient Position: Sitting, Cuff Size: Large)   Pulse 99   Temp 98.4 F (36.9 C) (Oral)   Ht 5\' 8"  (1.727 m)   Wt 247 lb (112 kg)   SpO2 95%   BMI 37.56 kg/m   Wt Readings from Last 3 Encounters:  08/20/17 247 lb (112 kg)  07/12/17 243 lb 8 oz (110.5 kg)  03/07/17 243 lb (110.2 kg)    Physical Exam  Constitutional: He appears well-developed and well-nourished. No distress.  HENT:  Head: Normocephalic and atraumatic.  Right Ear: Hearing, tympanic membrane, external ear and ear canal normal.  Left Ear: Hearing, tympanic membrane, external ear and ear canal normal.  Nose: No mucosal edema or rhinorrhea. Right sinus exhibits frontal sinus tenderness. Right sinus exhibits no maxillary sinus tenderness. Left sinus exhibits frontal sinus tenderness. Left sinus exhibits no maxillary sinus tenderness.  Mouth/Throat: Uvula is midline, oropharynx is clear and moist and mucous membranes are normal. No oropharyngeal exudate,  posterior oropharyngeal edema, posterior oropharyngeal erythema or tonsillar abscesses.  Eyes: Pupils are equal, round, and reactive to light. Conjunctivae and EOM are normal. No scleral icterus.  Neck: Normal range of motion. Neck supple.  Cardiovascular: Normal rate, regular rhythm, normal heart sounds and intact distal pulses.  No murmur heard. Pulmonary/Chest: Effort normal and breath sounds normal. No respiratory distress. He has no wheezes. He has no rales.  Lungs largely clear Coughing fits present  Lymphadenopathy:    He has no cervical adenopathy.  Skin: Skin is warm and dry. No rash noted.  Nursing note and vitals reviewed.  Results for orders placed or performed in visit on 07/09/17  Microalbumin / creatinine urine ratio  Result Value Ref Range   Microalb, Ur 2.6 (H) 0.0 - 1.9 mg/dL   Creatinine,U 217.2 mg/dL   Microalb Creat Ratio 1.2 0.0 - 30.0 mg/g  VITAMIN D 25 Hydroxy (Vit-D Deficiency, Fractures)  Result Value Ref Range   VITD 45.19 30.00 - 100.00 ng/mL  PSA  Result Value Ref Range   PSA 0.15 0.10 - 4.00 ng/mL  Hemoglobin A1c  Result Value Ref Range   Hgb A1c MFr Bld 6.5 4.6 - 6.5 %  Lipid panel  Result Value Ref Range   Cholesterol 172 0 - 200 mg/dL   Triglycerides 235.0 (H) 0.0 - 149.0 mg/dL   HDL 53.30 >39.00 mg/dL   VLDL 47.0 (H) 0.0 - 40.0 mg/dL   Total CHOL/HDL Ratio 3    NonHDL 118.56   Comprehensive metabolic panel  Result Value Ref Range   Sodium 138 135 - 145 mEq/L   Potassium 4.2 3.5 - 5.1 mEq/L   Chloride 102 96 - 112 mEq/L   CO2 28 19 - 32 mEq/L   Glucose, Bld 103 (H) 70 - 99 mg/dL   BUN 24 (H) 6 - 23 mg/dL   Creatinine, Ser 0.93 0.40 - 1.50 mg/dL   Total Bilirubin 0.3 0.2 - 1.2 mg/dL   Alkaline Phosphatase 41 39 - 117 U/L   AST 38 (H) 0 - 37 U/L   ALT 53 0 - 53 U/L   Total Protein 6.8 6.0 - 8.3 g/dL   Albumin 4.1 3.5 - 5.2 g/dL   Calcium 9.1 8.4 - 10.5 mg/dL   GFR 88.13 >60.00 mL/min  LDL cholesterol, direct  Result Value Ref Range     Direct LDL 94.0 mg/dL      Assessment & Plan:   Problem List Items Addressed This Visit    Asthma - Primary    Currently in flare from presumed sinusitis. Will Rx steroid course, augmentin course, scheduled albuterol neb/inhaler Q4-6 hours over next several days.  Update if not improving with treatment. Pt agrees with plan.       Relevant Medications   predniSONE (DELTASONE) 20 MG tablet  Other Visit Diagnoses    Acute non-recurrent sinusitis, unspecified location       Relevant Medications   predniSONE (DELTASONE) 20 MG tablet   amoxicillin-clavulanate (AUGMENTIN) 875-125 MG tablet       Meds ordered this encounter  Medications  . predniSONE (DELTASONE) 20 MG tablet    Sig: Take two tablets daily for 3 days followed by one tablet daily for 4 days    Dispense:  10 tablet    Refill:  0  . amoxicillin-clavulanate (AUGMENTIN) 875-125 MG tablet    Sig: Take 1 tablet by mouth 2 (two) times daily for 10 days.    Dispense:  20 tablet    Refill:  0   No orders of the defined types were placed in this encounter.   Follow up plan: No follow-ups on file.  Ria Bush, MD

## 2017-08-20 NOTE — Assessment & Plan Note (Signed)
Currently in flare from presumed sinusitis. Will Rx steroid course, augmentin course, scheduled albuterol neb/inhaler Q4-6 hours over next several days.  Update if not improving with treatment. Pt agrees with plan.

## 2017-08-22 ENCOUNTER — Other Ambulatory Visit: Payer: Self-pay | Admitting: Family Medicine

## 2017-09-29 ENCOUNTER — Other Ambulatory Visit: Payer: Self-pay | Admitting: Family Medicine

## 2017-10-15 ENCOUNTER — Encounter: Payer: Self-pay | Admitting: Family Medicine

## 2017-10-15 ENCOUNTER — Ambulatory Visit (INDEPENDENT_AMBULATORY_CARE_PROVIDER_SITE_OTHER): Payer: 59 | Admitting: Family Medicine

## 2017-10-15 VITALS — BP 136/84 | HR 87 | Temp 98.3°F | Ht 68.0 in | Wt 243.5 lb

## 2017-10-15 DIAGNOSIS — J4541 Moderate persistent asthma with (acute) exacerbation: Secondary | ICD-10-CM | POA: Diagnosis not present

## 2017-10-15 DIAGNOSIS — J0100 Acute maxillary sinusitis, unspecified: Secondary | ICD-10-CM

## 2017-10-15 MED ORDER — AMOXICILLIN-POT CLAVULANATE 875-125 MG PO TABS: 1.0000 | ORAL_TABLET | Freq: Two times a day (BID) | ORAL | 0 refills | Status: AC

## 2017-10-15 MED ORDER — PREDNISONE 20 MG PO TABS: ORAL_TABLET | ORAL | 0 refills | Status: DC

## 2017-10-15 NOTE — Assessment & Plan Note (Addendum)
Currently in flare likely from sinusitis. Will Rx augmentin, prednisone taper, scheduled albuterol. Update if not improving with treatment. Pt agrees with plan.  Latest flare 08/2017

## 2017-10-15 NOTE — Patient Instructions (Addendum)
I think you have sinus infection that has led to asthma exacerbation. Treat with augmentin course, prednisone taper, and scheduled albuterol 2 puffs three times a day for next 2-3 days.  Let us know if fever >101, worsening productive cough, or not improving with treatment.

## 2017-10-15 NOTE — Assessment & Plan Note (Signed)
Rx augmentin, prednisone, albuterol scheduled - see above.

## 2017-10-15 NOTE — Progress Notes (Signed)
BP 136/84 (BP Location: Left Arm, Patient Position: Sitting, Cuff Size: Large)   Pulse 87   Temp 98.3 F (36.8 C) (Oral)   Ht 5\' 8"  (1.727 m)   Wt 243 lb 8 oz (110.5 kg)   SpO2 95%   BMI 37.02 kg/m    CC: coguh Subjective:    Patient ID: Erik Allen, male    DOB: Sep 30, 1955, 62 y.o.   MRN: [id number]  HPI: ANTONIN MEININGER is a 62 y.o. male presenting on 10/15/2017 for Cough (C/o somewhat productive cough, worse at night. Also, has some chest congestion, nasal drainage, facial pain, HA and hoarseness. Sxs started about 2 wks ago. Tried OTC cough/congestion meds, barely helpful. )   2 wk h/o head and chest congestion, clogged ears, productive cough worse at night time, progressively worsening. Bad headache for days, bilateral frontal facial pain and pressure. Increased dyspnea and night time wheezing.   Denies fevers/chills, ear pain, ST or PNDrainage.  Treating with OTC remedies with mild improvement including mucinex and decongestants. No sick contacts at home. Known asthmatic, had noticed increased need for albuterol rescue inhaler with increased heat and humidity. H/o CAP 01/2015.   Regular asthma regimen - wixela1 puff BID, singulair 10mg  daily and albuterol inh PRN (more regularly recently).  No recent antibiotics  Relevant past medical, surgical, family and social history reviewed and updated as indicated. Interim medical history since our last visit reviewed. Allergies and medications reviewed and updated. Outpatient Medications Prior to Visit  Medication Sig Dispense Refill  . albuterol (PROVENTIL) (2.5 MG/3ML) 0.083% nebulizer solution USE 1 VIAL VIA NEBULIZER EVERY 6 HOURS AS NEEDED 150 mL 1  . cholecalciferol (VITAMIN D) 1000 units tablet Take 2,000 Units by mouth daily.    . cyanocobalamin (V-R VITAMIN B-12) 500 MCG tablet Take 1 tablet (500 mcg total) by mouth daily.    Marland Kitchen dexlansoprazole (DEXILANT) 60 MG capsule Take 60 mg by mouth daily.    . finasteride  (PROSCAR) 5 MG tablet TAKE 1 TABLET(5 MG) BY MOUTH DAILY 90 tablet 2  . losartan-hydrochlorothiazide (HYZAAR) 100-25 MG tablet TAKE 1 TABLET BY MOUTH EVERY DAY 90 tablet 1  . metFORMIN (GLUCOPHAGE) 500 MG tablet TAKE 1 TABLET BY MOUTH TWICE DAILY WITH MEALS 180 tablet 1  . montelukast (SINGULAIR) 10 MG tablet TAKE 1 TABLET BY MOUTH EVERY NIGHT AT BEDTIME 90 tablet 2  . mupirocin cream (BACTROBAN) 2 % Apply 1 application topically 2 (two) times daily. 15 g 0  . simvastatin (ZOCOR) 80 MG tablet TAKE 1 TABLET BY MOUTH AT BEDTIME 90 tablet 1  . VENTOLIN HFA 108 (90 Base) MCG/ACT inhaler Inhale 2 puffs into the lungs every 6 (six) hours as needed for wheezing or shortness of breath. 18 g 11  . WIXELA INHUB 250-50 MCG/DOSE AEPB INHALE 1 PUFF BY MOUTH TWICE DAILY. RINSE MOUTH AFTER USE 60 each 3  . predniSONE (DELTASONE) 20 MG tablet Take two tablets daily for 3 days followed by one tablet daily for 4 days 10 tablet 0   No facility-administered medications prior to visit.      Per HPI unless specifically indicated in ROS section below Review of Systems     Objective:    BP 136/84 (BP Location: Left Arm, Patient Position: Sitting, Cuff Size: Large)   Pulse 87   Temp 98.3 F (36.8 C) (Oral)   Ht 5\' 8"  (1.727 m)   Wt 243 lb 8 oz (110.5 kg)   SpO2 95%   BMI  37.02 kg/m   Wt Readings from Last 3 Encounters:  10/15/17 243 lb 8 oz (110.5 kg)  08/20/17 247 lb (112 kg)  07/12/17 243 lb 8 oz (110.5 kg)    Physical Exam  Constitutional: He appears well-developed and well-nourished. No distress.  HENT:  Head: Normocephalic and atraumatic.  Right Ear: Hearing, tympanic membrane, external ear and ear canal normal.  Left Ear: Hearing, tympanic membrane, external ear and ear canal normal.  Nose: Mucosal edema present. No rhinorrhea. Right sinus exhibits maxillary sinus tenderness and frontal sinus tenderness. Left sinus exhibits maxillary sinus tenderness and frontal sinus tenderness.  Mouth/Throat:  Uvula is midline and mucous membranes are normal. Posterior oropharyngeal erythema present. No oropharyngeal exudate, posterior oropharyngeal edema or tonsillar abscesses.  Eyes: Pupils are equal, round, and reactive to light. Conjunctivae and EOM are normal. No scleral icterus.  Neck: Normal range of motion. Neck supple.  Cardiovascular: Normal rate, regular rhythm and intact distal pulses.  Murmur (2/6 systolic) heard. Pulmonary/Chest: Effort normal and breath sounds normal. No respiratory distress. He has no wheezes. He has no rales.  Lymphadenopathy:    He has no cervical adenopathy.  Skin: Skin is warm and dry. No rash noted.  Nursing note and vitals reviewed.      Assessment & Plan:   Problem List Items Addressed This Visit    Asthma - Primary    Currently in flare likely from sinusitis. Will Rx augmentin, prednisone taper, scheduled albuterol. Update if not improving with treatment. Pt agrees with plan.  Latest flare 08/2017      Relevant Medications   predniSONE (DELTASONE) 20 MG tablet   Acute sinusitis    Rx augmentin, prednisone, albuterol scheduled - see above.      Relevant Medications   amoxicillin-clavulanate (AUGMENTIN) 875-125 MG tablet   predniSONE (DELTASONE) 20 MG tablet       Meds ordered this encounter  Medications  . amoxicillin-clavulanate (AUGMENTIN) 875-125 MG tablet    Sig: Take 1 tablet by mouth 2 (two) times daily for 10 days.    Dispense:  20 tablet    Refill:  0  . predniSONE (DELTASONE) 20 MG tablet    Sig: Take 3 tablets daily for 2 days followed by 2 tablet daily for 2 days then 1 tablet for 3 days    Dispense:  13 tablet    Refill:  0   No orders of the defined types were placed in this encounter.   Follow up plan: Return if symptoms worsen or fail to improve.  Ria Bush, MD

## 2017-10-28 ENCOUNTER — Telehealth: Payer: Self-pay | Admitting: Family Medicine

## 2017-10-28 NOTE — Telephone Encounter (Signed)
Copied from [place] [id number]. Topic: Quick Communication - See Telephone Encounter >> Oct 28, 2017  8:19 AM Valla Leaver wrote: CRM for notification. See Telephone encounter for: 10/28/17. Patient is still hoarse after treatment for sinus infection and wants to know what to do next?

## 2017-10-28 NOTE — Telephone Encounter (Signed)
Spoke with pt, states he is only having the hoarseness. I relayed Dr. Synthia Innocent instructions and message.  Pt verbalizes understanding.

## 2017-10-28 NOTE — Telephone Encounter (Signed)
How are other symptoms? Cough, dyspnea, wheeze, HA? If improving recommend give this more time - hoarseness may be coming from residual vocal cord inflammation after prior infection.  Also add pepcid nightly to his dexilant regimen fior GERD. If ongoing hoarseness in 1-2 wks let us know.

## 2017-10-29 DIAGNOSIS — J4520 Mild intermittent asthma, uncomplicated: Secondary | ICD-10-CM | POA: Diagnosis not present

## 2017-10-29 DIAGNOSIS — K219 Gastro-esophageal reflux disease without esophagitis: Secondary | ICD-10-CM | POA: Diagnosis not present

## 2017-10-29 DIAGNOSIS — R490 Dysphonia: Secondary | ICD-10-CM | POA: Diagnosis not present

## 2017-11-09 ENCOUNTER — Other Ambulatory Visit: Payer: Self-pay | Admitting: Family Medicine

## 2017-11-22 DIAGNOSIS — D2262 Melanocytic nevi of left upper limb, including shoulder: Secondary | ICD-10-CM | POA: Diagnosis not present

## 2017-11-22 DIAGNOSIS — D2261 Melanocytic nevi of right upper limb, including shoulder: Secondary | ICD-10-CM | POA: Diagnosis not present

## 2017-11-22 DIAGNOSIS — L570 Actinic keratosis: Secondary | ICD-10-CM | POA: Diagnosis not present

## 2017-11-22 DIAGNOSIS — D225 Melanocytic nevi of trunk: Secondary | ICD-10-CM | POA: Diagnosis not present

## 2017-11-22 DIAGNOSIS — X32XXXA Exposure to sunlight, initial encounter: Secondary | ICD-10-CM | POA: Diagnosis not present

## 2017-11-27 DIAGNOSIS — R490 Dysphonia: Secondary | ICD-10-CM | POA: Diagnosis not present

## 2017-11-27 DIAGNOSIS — K219 Gastro-esophageal reflux disease without esophagitis: Secondary | ICD-10-CM | POA: Diagnosis not present

## 2017-12-16 DIAGNOSIS — K219 Gastro-esophageal reflux disease without esophagitis: Secondary | ICD-10-CM | POA: Diagnosis not present

## 2017-12-16 DIAGNOSIS — J040 Acute laryngitis: Secondary | ICD-10-CM | POA: Diagnosis not present

## 2017-12-16 DIAGNOSIS — J45901 Unspecified asthma with (acute) exacerbation: Secondary | ICD-10-CM | POA: Diagnosis not present

## 2017-12-21 LAB — HM DIABETES EYE EXAM

## 2018-01-05 ENCOUNTER — Other Ambulatory Visit: Payer: Self-pay | Admitting: Family Medicine

## 2018-01-07 DIAGNOSIS — J040 Acute laryngitis: Secondary | ICD-10-CM | POA: Diagnosis not present

## 2018-01-07 DIAGNOSIS — K219 Gastro-esophageal reflux disease without esophagitis: Secondary | ICD-10-CM | POA: Diagnosis not present

## 2018-01-12 NOTE — Progress Notes (Addendum)
BP 140/82 (BP Location: Left Arm, Patient Position: Sitting, Cuff Size: Large)   Pulse 91   Temp 98.2 F (36.8 C) (Oral)   Ht 5\' 8"  (1.727 m)   Wt 244 lb 8 oz (110.9 kg)   SpO2 95%   PF 550 L/min Comment: 2nd- 500, 3rd- 480  BMI 37.18 kg/m    CC: 6 mo f/u visit Subjective:    Patient ID: Erik Allen, male    DOB: 09-30-1955, 62 y.o.   MRN: [id number]  HPI: Erik Allen is a 62 y.o. male presenting on 01/13/2018 for Follow-up (Here for 6 mo f/u.)   Getting over holiday stress - he hosted Thanksgiving dinner.   Hasn't been tested for OSA - endorses mild daytime somnolence, denies significant trouble sleeping. Some snoring but has never been told he stops breathing at night time.   HTN - occasionally checks at home, running well controlled. Compliant with losartan hctz 100/25mg  daily. No HA, vision changes, leg swelling.   Asthma - current regimen is wixela 1 puff BID, singulair 10mg  daily, albuterol PRN (Ventolin DAW). Has had 2 asthma flares this year. Noticing increasing coughing at night time when laying supine. Sleeps on 2 pillows - chronically. Noticing increasing chest tightness with radiation to center of back. Not exertional, can happen randomly. Not relieved by rest.   DM - does not regularly check sugars. Compliant with antihyperglycemic regimen which includes: metformin 500mg  bid. Denies low sugars or hypoglycemic symptoms. Denies paresthesias. Last diabetic eye exam DUE. Pneumovax: 2014. Prevnar: not due. Glucometer brand: does not have one. DSME: has previously declined. Lab Results  Component Value Date   HGBA1C 6.1 (A) 01/13/2018   Diabetic Foot Exam - Simple   Simple Foot Form Diabetic Foot exam was performed with the following findings:  Yes 01/13/2018  8:37 AM  Visual Inspection No deformities, no ulcerations, no other skin breakdown bilaterally:  Yes Sensation Testing Intact to touch and monofilament testing bilaterally:  Yes Pulse Check Posterior  Tibialis and Dorsalis pulse intact bilaterally:  Yes Comments    Lab Results  Component Value Date   MICROALBUR 2.6 (H) 07/09/2017     Relevant past medical, surgical, family and social history reviewed and updated as indicated. Interim medical history since our last visit reviewed. Allergies and medications reviewed and updated. Outpatient Medications Prior to Visit  Medication Sig Dispense Refill  . albuterol (PROVENTIL) (2.5 MG/3ML) 0.083% nebulizer solution USE 1 VIAL VIA NEBULIZER EVERY 6 HOURS AS NEEDED 150 mL 1  . cholecalciferol (VITAMIN D) 1000 units tablet Take 2,000 Units by mouth daily.    . cyanocobalamin (V-R VITAMIN B-12) 500 MCG tablet Take 1 tablet (500 mcg total) by mouth daily.    Marland Kitchen dexlansoprazole (DEXILANT) 60 MG capsule Take 60 mg by mouth daily.    . finasteride (PROSCAR) 5 MG tablet TAKE 1 TABLET(5 MG) BY MOUTH DAILY 90 tablet 2  . losartan-hydrochlorothiazide (HYZAAR) 100-25 MG tablet TAKE 1 TABLET BY MOUTH EVERY DAY 90 tablet 0  . metFORMIN (GLUCOPHAGE) 500 MG tablet TAKE 1 TABLET BY MOUTH TWICE DAILY WITH MEALS 180 tablet 0  . montelukast (SINGULAIR) 10 MG tablet TAKE 1 TABLET BY MOUTH EVERY NIGHT AT BEDTIME 90 tablet 2  . mupirocin cream (BACTROBAN) 2 % Apply 1 application topically 2 (two) times daily. 15 g 0  . predniSONE (DELTASONE) 20 MG tablet Take 3 tablets daily for 2 days followed by 2 tablet daily for 2 days then 1 tablet for 3 days  13 tablet 0  . simvastatin (ZOCOR) 80 MG tablet TAKE 1 TABLET BY MOUTH AT BEDTIME 90 tablet 1  . VENTOLIN HFA 108 (90 Base) MCG/ACT inhaler Inhale 2 puffs into the lungs every 6 (six) hours as needed for wheezing or shortness of breath. 18 g 11  . WIXELA INHUB 250-50 MCG/DOSE AEPB INHALE 1 PUFF BY MOUTH TWICE DAILY. RINSE MOUTH AFTER USE 60 each 3   No facility-administered medications prior to visit.      Erik HPI unless specifically indicated in ROS section below Review of Systems     Objective:    BP 140/82 (BP  Location: Left Arm, Patient Position: Sitting, Cuff Size: Large)   Pulse 91   Temp 98.2 F (36.8 C) (Oral)   Ht 5\' 8"  (1.727 m)   Wt 244 lb 8 oz (110.9 kg)   SpO2 95%   PF 550 L/min Comment: 2nd- 500, 3rd- 480  BMI 37.18 kg/m   Wt Readings from Last 3 Encounters:  01/13/18 244 lb 8 oz (110.9 kg)  10/15/17 243 lb 8 oz (110.5 kg)  08/20/17 247 lb (112 kg)    Physical Exam  Constitutional: He appears well-developed and well-nourished. No distress.  HENT:  Head: Normocephalic and atraumatic.  Right Ear: External ear normal.  Left Ear: External ear normal.  Nose: Nose normal.  Mouth/Throat: Oropharynx is clear and moist. No oropharyngeal exudate.  Eyes: Pupils are equal, round, and reactive to light. Conjunctivae and EOM are normal. No scleral icterus.  Neck: Normal range of motion. Neck supple.  Cardiovascular: Normal rate, regular rhythm and intact distal pulses.  Murmur (4/6 systolic throughout) heard. Pulmonary/Chest: Effort normal and breath sounds normal. No respiratory distress. He has no wheezes. He has no rales.  Musculoskeletal: He exhibits no edema.  See HPI for foot exam if done  Lymphadenopathy:    He has no cervical adenopathy.  Skin: Skin is warm and dry. No rash noted.  Psychiatric: He has a normal mood and affect.  Nursing note and vitals reviewed.  Results for orders placed or performed in visit on 01/13/18  POCT glycosylated hemoglobin (Hb A1C)  Result Value Ref Range   Hemoglobin A1C 6.1 (A) 4.0 - 5.6 %   HbA1c POC (<> result, manual entry)     HbA1c, POC (prediabetic range)     HbA1c, POC (controlled diabetic range)     EKG - NSR rate 70s, normal axis, intervals, no acute ST/T changes     Assessment & Plan:   Problem List Items Addressed This Visit    Systolic murmur    Update echo due to harsher/louder sounding murmur today as well as chest discomfort.       Relevant Orders   ECHOCARDIOGRAM COMPLETE   Severe obesity (BMI 35.0-39.9) with  comorbidity ([place])   Essential hypertension    Chronic, adequate. Continue losartan/hctz. Consider addition of beta blockade pending EKG/echo results.       Controlled type 2 diabetes mellitus without complication, without long-term current use of insulin (HCC) - Primary    Chronic, stable. Continue current regimen. Foot exam today.       Relevant Orders   POCT glycosylated hemoglobin (Hb A1C) (Completed)   Chest discomfort    Chest discomfort sounds very atypical for cardiac cause. Check EKG regardless given comorbidities.       Relevant Orders   EKG 12-Lead (Completed)   ECHOCARDIOGRAM COMPLETE   Asthma    Chronic, stable. Continue wixela 250/50mg  1 puff BID. No  signs of active asthma exacerbation at this time. Will continue this along with ventolin DAW PRN.  Updated Peak Flow today = 550 L/min (expected = 525 L/min)          No orders of the defined types were placed in this encounter.  Orders Placed This Encounter  Procedures  . POCT glycosylated hemoglobin (Hb A1C)  . EKG 12-Lead  . ECHOCARDIOGRAM COMPLETE    Standing Status:   Future    Standing Expiration Date:   04/14/2019    Order Specific Question:   Where should this test be performed    Answer:   [facility]    Order Specific Question:   Perflutren DEFINITY (image enhancing agent) should be administered unless hypersensitivity or allergy exist    Answer:   Administer Perflutren    Follow up plan: Return in about 3 months (around 04/14/2018) for follow up visit.  Ria Bush, MD

## 2018-01-13 ENCOUNTER — Ambulatory Visit (INDEPENDENT_AMBULATORY_CARE_PROVIDER_SITE_OTHER): Payer: 59 | Admitting: Family Medicine

## 2018-01-13 ENCOUNTER — Encounter: Payer: Self-pay | Admitting: Family Medicine

## 2018-01-13 ENCOUNTER — Other Ambulatory Visit: Payer: Self-pay | Admitting: Family Medicine

## 2018-01-13 VITALS — BP 140/82 | HR 91 | Temp 98.2°F | Ht 68.0 in | Wt 244.5 lb

## 2018-01-13 DIAGNOSIS — J4540 Moderate persistent asthma, uncomplicated: Secondary | ICD-10-CM | POA: Diagnosis not present

## 2018-01-13 DIAGNOSIS — E119 Type 2 diabetes mellitus without complications: Secondary | ICD-10-CM

## 2018-01-13 DIAGNOSIS — R011 Cardiac murmur, unspecified: Secondary | ICD-10-CM

## 2018-01-13 DIAGNOSIS — I10 Essential (primary) hypertension: Secondary | ICD-10-CM

## 2018-01-13 DIAGNOSIS — R0789 Other chest pain: Secondary | ICD-10-CM

## 2018-01-13 LAB — POCT GLYCOSYLATED HEMOGLOBIN (HGB A1C): HEMOGLOBIN A1C: 6.1 % — AB (ref 4.0–5.6)

## 2018-01-13 NOTE — Assessment & Plan Note (Signed)
Chronic, stable. Continue current regimen. Foot exam today.

## 2018-01-13 NOTE — Assessment & Plan Note (Addendum)
Chronic, stable. Continue wixela 250/50mg  1 puff BID. No signs of active asthma exacerbation at this time. Will continue this along with ventolin DAW PRN.  Updated Peak Flow today = 550 L/min (expected = 525 L/min)

## 2018-01-13 NOTE — Assessment & Plan Note (Signed)
Update echo due to harsher/louder sounding murmur today as well as chest discomfort.

## 2018-01-13 NOTE — Assessment & Plan Note (Addendum)
Chronic, adequate. Continue losartan/hctz. Consider addition of beta blockade pending EKG/echo results.

## 2018-01-13 NOTE — Patient Instructions (Addendum)
EKG today  Peak flow today Murmur sounds louder - I'd like to order ultrasound of heart for further evaluation. See one of our referral coordinators to schedule ultrasound.  We may discuss sleep study pending results.  Continue current medicines.

## 2018-01-13 NOTE — Assessment & Plan Note (Addendum)
Chest discomfort sounds very atypical for cardiac cause. Check EKG regardless given comorbidities.

## 2018-02-03 ENCOUNTER — Ambulatory Visit (INDEPENDENT_AMBULATORY_CARE_PROVIDER_SITE_OTHER): Payer: 59

## 2018-02-03 ENCOUNTER — Other Ambulatory Visit: Payer: Self-pay

## 2018-02-03 DIAGNOSIS — R0789 Other chest pain: Secondary | ICD-10-CM

## 2018-02-03 DIAGNOSIS — R011 Cardiac murmur, unspecified: Secondary | ICD-10-CM | POA: Diagnosis not present

## 2018-02-04 ENCOUNTER — Encounter: Payer: Self-pay | Admitting: Family Medicine

## 2018-02-06 DIAGNOSIS — K219 Gastro-esophageal reflux disease without esophagitis: Secondary | ICD-10-CM | POA: Diagnosis not present

## 2018-02-06 DIAGNOSIS — J370 Chronic laryngitis: Secondary | ICD-10-CM | POA: Diagnosis not present

## 2018-02-17 ENCOUNTER — Telehealth: Payer: Self-pay | Admitting: Family Medicine

## 2018-02-17 NOTE — Telephone Encounter (Signed)
Pt called office to update the  Pharmacy he will be using. He will be using [facility] in [place] from here on out. Pt is also requesting refills for the Metformin, Finasteride, Montelukast, and the Losartan to [facility] to [facility]

## 2018-02-18 MED ORDER — FINASTERIDE 5 MG PO TABS: ORAL_TABLET | ORAL | 1 refills | Status: DC

## 2018-02-18 MED ORDER — METFORMIN HCL 500 MG PO TABS: 500.0000 mg | ORAL_TABLET | Freq: Two times a day (BID) | ORAL | 0 refills | Status: DC

## 2018-02-18 MED ORDER — MONTELUKAST SODIUM 10 MG PO TABS: 10.0000 mg | ORAL_TABLET | Freq: Every day | ORAL | 1 refills | Status: DC

## 2018-02-18 MED ORDER — LOSARTAN POTASSIUM-HCTZ 100-25 MG PO TABS: 1.0000 | ORAL_TABLET | Freq: Every day | ORAL | 0 refills | Status: DC

## 2018-02-18 NOTE — Addendum Note (Signed)
Addended by: Brenton Grills on: 0/03/2024 69:16 PM   Modules accepted: Orders

## 2018-02-18 NOTE — Telephone Encounter (Signed)
Updated pt's pharmacy list to [facility] and e-scribed refills for meds requested.

## 2018-03-07 DIAGNOSIS — J370 Chronic laryngitis: Secondary | ICD-10-CM | POA: Diagnosis not present

## 2018-03-07 DIAGNOSIS — K219 Gastro-esophageal reflux disease without esophagitis: Secondary | ICD-10-CM | POA: Diagnosis not present

## 2018-04-05 ENCOUNTER — Other Ambulatory Visit: Payer: Self-pay | Admitting: Family Medicine

## 2018-04-10 ENCOUNTER — Other Ambulatory Visit: Payer: Self-pay | Admitting: General Practice

## 2018-04-10 MED ORDER — SIMVASTATIN 80 MG PO TABS: 80.0000 mg | ORAL_TABLET | Freq: Every day | ORAL | 0 refills | Status: DC

## 2018-04-10 MED ORDER — FLUTICASONE-SALMETEROL 250-50 MCG/DOSE IN AEPB: INHALATION_SPRAY | RESPIRATORY_TRACT | 3 refills | Status: DC

## 2018-04-17 ENCOUNTER — Ambulatory Visit (INDEPENDENT_AMBULATORY_CARE_PROVIDER_SITE_OTHER): Payer: 59 | Admitting: Family Medicine

## 2018-04-17 ENCOUNTER — Encounter: Payer: Self-pay | Admitting: Family Medicine

## 2018-04-17 VITALS — BP 130/78 | HR 86 | Temp 98.2°F | Ht 68.0 in | Wt 239.4 lb

## 2018-04-17 DIAGNOSIS — I350 Nonrheumatic aortic (valve) stenosis: Secondary | ICD-10-CM

## 2018-04-17 DIAGNOSIS — J4540 Moderate persistent asthma, uncomplicated: Secondary | ICD-10-CM

## 2018-04-17 DIAGNOSIS — E119 Type 2 diabetes mellitus without complications: Secondary | ICD-10-CM

## 2018-04-17 DIAGNOSIS — J302 Other seasonal allergic rhinitis: Secondary | ICD-10-CM | POA: Diagnosis not present

## 2018-04-17 DIAGNOSIS — I10 Essential (primary) hypertension: Secondary | ICD-10-CM

## 2018-04-17 LAB — POCT GLYCOSYLATED HEMOGLOBIN (HGB A1C): Hemoglobin A1C: 6.4 % — AB (ref 4.0–5.6)

## 2018-04-17 MED ORDER — ALBUTEROL SULFATE (2.5 MG/3ML) 0.083% IN NEBU: INHALATION_SOLUTION | RESPIRATORY_TRACT | 3 refills | Status: AC

## 2018-04-17 NOTE — Progress Notes (Signed)
BP 130/78 (BP Location: Left Arm, Patient Position: Sitting, Cuff Size: Normal)   Pulse 86   Temp 98.2 F (36.8 C) (Oral)   Ht 5\' 8"  (1.727 m)   Wt 239 lb 6 oz (108.6 kg)   SpO2 96%   BMI 36.40 kg/m    CC: 3 mo f/u visit Subjective:    Patient ID: Erik Allen, male    DOB: Feb 04, 1956, 63 y.o.   MRN: [id number]  HPI: Erik Allen is a 63 y.o. male presenting on 04/17/2018 for Follow-up (Here for 3 mo f/u.)   He feels allergies coming on. On singulair. Doesn't like flonase.   DM - does not regularly check sugars. Compliant with antihyperglycemic regimen which includes: metformin 500mg  bid. he is trying to limit portions. Denies low sugars or hypoglycemic symptoms. Denies paresthesias. Last diabetic eye exam: 12/2017 - record will be requested. Pneumovax: 2014. Prevnar: not due. Glucometer brand: does not have one. DSME: previously declined. Lab Results  Component Value Date   HGBA1C 6.4 (A) 04/17/2018   Diabetic Foot Exam - Simple   No data filed     Lab Results  Component Value Date   MICROALBUR 2.6 (H) 07/09/2017     Echocardiogram 01/2018 - Mild LVH, normal sys fxn EF 55-60%, G1DD, moderate AS with mildly dilated LA     Relevant past medical, surgical, family and social history reviewed and updated as indicated. Interim medical history since our last visit reviewed. Allergies and medications reviewed and updated. Outpatient Medications Prior to Visit  Medication Sig Dispense Refill  . cholecalciferol (VITAMIN D) 1000 units tablet Take 2,000 Units by mouth daily.    . cyanocobalamin (V-R VITAMIN B-12) 500 MCG tablet Take 1 tablet (500 mcg total) by mouth daily.    Marland Kitchen dexlansoprazole (DEXILANT) 60 MG capsule Take 60 mg by mouth daily.    . finasteride (PROSCAR) 5 MG tablet TAKE 1 TABLET(5 MG) BY MOUTH DAILY 90 tablet 1  . Fluticasone-Salmeterol (WIXELA INHUB) 250-50 MCG/DOSE AEPB INHALE 1 PUFF BY MOUTH TWICE DAILY. RINSE MOUTH AFTER USE 60 each 3  .  losartan-hydrochlorothiazide (HYZAAR) 100-25 MG tablet Take 1 tablet by mouth daily. 90 tablet 0  . metFORMIN (GLUCOPHAGE) 500 MG tablet Take 1 tablet (500 mg total) by mouth 2 (two) times daily with a meal. 180 tablet 0  . montelukast (SINGULAIR) 10 MG tablet Take 1 tablet (10 mg total) by mouth at bedtime. 90 tablet 1  . mupirocin cream (BACTROBAN) 2 % Apply 1 application topically 2 (two) times daily. (Patient taking differently: Apply 1 application topically 2 (two) times daily. As needed) 15 g 0  . simvastatin (ZOCOR) 80 MG tablet Take 1 tablet (80 mg total) by mouth at bedtime. 90 tablet 0  . VENTOLIN HFA 108 (90 Base) MCG/ACT inhaler Inhale 2 puffs into the lungs every 6 (six) hours as needed for wheezing or shortness of breath. 18 g 11  . albuterol (PROVENTIL) (2.5 MG/3ML) 0.083% nebulizer solution USE 1 VIAL VIA NEBULIZER EVERY 6 HOURS AS NEEDED 150 mL 1  . predniSONE (DELTASONE) 20 MG tablet Take 3 tablets daily for 2 days followed by 2 tablet daily for 2 days then 1 tablet for 3 days 13 tablet 0   No facility-administered medications prior to visit.      Per HPI unless specifically indicated in ROS section below Review of Systems Objective:    BP 130/78 (BP Location: Left Arm, Patient Position: Sitting, Cuff Size: Normal)   Pulse 86  Temp 98.2 F (36.8 C) (Oral)   Ht 5\' 8"  (1.727 m)   Wt 239 lb 6 oz (108.6 kg)   SpO2 96%   BMI 36.40 kg/m   Wt Readings from Last 3 Encounters:  04/17/18 239 lb 6 oz (108.6 kg)  01/13/18 244 lb 8 oz (110.9 kg)  10/15/17 243 lb 8 oz (110.5 kg)    Physical Exam Vitals signs and nursing note reviewed.  Constitutional:      General: He is not in acute distress.    Appearance: He is well-developed.  HENT:     Head: Normocephalic and atraumatic.     Right Ear: External ear normal.     Left Ear: External ear normal.     Nose: Nose normal.     Mouth/Throat:     Mouth: Mucous membranes are moist.     Pharynx: No oropharyngeal exudate.  Eyes:      General: No scleral icterus.    Conjunctiva/sclera: Conjunctivae normal.     Pupils: Pupils are equal, round, and reactive to light.  Neck:     Musculoskeletal: Normal range of motion and neck supple.  Cardiovascular:     Rate and Rhythm: Normal rate and regular rhythm.     Pulses: Normal pulses.     Heart sounds: Murmur (3/6 systolic at LUSB) present.  Pulmonary:     Effort: Pulmonary effort is normal. No respiratory distress.     Breath sounds: Normal breath sounds. No wheezing, rhonchi or rales.  Musculoskeletal:     Comments: See HPI for foot exam if done  Lymphadenopathy:     Cervical: No cervical adenopathy.  Skin:    General: Skin is warm and dry.     Findings: No rash.  Psychiatric:        Mood and Affect: Mood normal.       Results for orders placed or performed in visit on 04/17/18  POCT glycosylated hemoglobin (Hb A1C)  Result Value Ref Range   Hemoglobin A1C 6.4 (A) 4.0 - 5.6 %   HbA1c POC (<> result, manual entry)     HbA1c, POC (prediabetic range)     HbA1c, POC (controlled diabetic range)    HM DIABETES EYE EXAM  Result Value Ref Range   HM Diabetic Eye Exam No Retinopathy No Retinopathy   Assessment & Plan:   Problem List Items Addressed This Visit    Severe obesity (BMI 35.0-39.9) with comorbidity ([facility])    Encouraged ongoing efforts at weight loss.       Essential hypertension    Chronic, stable. Continue current regimen.       Controlled type 2 diabetes mellitus without complication, without long-term current use of insulin ([facility]) - Primary    Chronic, stable. A1c remains in controlled diabetes range. Discussed continuing to follow diabetic diet.       Relevant Orders   POCT glycosylated hemoglobin (Hb A1C) (Completed)   Asthma    Stable period. Continue current regimen (wixela 1 puff bid and singulair with alb PRN)      Relevant Medications   albuterol (PROVENTIL) (2.5 MG/3ML) 0.083% nebulizer solution   Aortic stenosis    Discussed dx  with patient.       Allergic rhinitis    Discussed starting antihistamine for allergy season He has started using "first defense" nasal filters which he feels have been helpful.          Meds ordered this encounter  Medications  . albuterol (PROVENTIL) (2.5 MG/3ML)  0.083% nebulizer solution    Sig: USE 1 VIAL VIA NEBULIZER EVERY 6 HOURS AS NEEDED    Dispense:  150 mL    Refill:  3   Orders Placed This Encounter  Procedures  . POCT glycosylated hemoglobin (Hb A1C)  . HM DIABETES EYE EXAM    This external order was created through the Results Console.    Follow up plan: Return in about 3 months (around 07/18/2018) for annual exam, prior fasting for blood work.  Ria Bush, MD

## 2018-04-17 NOTE — Assessment & Plan Note (Signed)
Chronic, stable. Continue current regimen. 

## 2018-04-17 NOTE — Assessment & Plan Note (Signed)
Discussed dx with patient.

## 2018-04-17 NOTE — Assessment & Plan Note (Signed)
Encouraged ongoing efforts at weight loss.

## 2018-04-17 NOTE — Assessment & Plan Note (Addendum)
Discussed starting antihistamine for allergy season He has started using "first defense" nasal filters which he feels have been helpful.

## 2018-04-17 NOTE — Patient Instructions (Addendum)
Start claritin or zyrtec for spring allergies.  We will request records from eye doctor.  Return in 3 months for wellness visit.

## 2018-04-17 NOTE — Assessment & Plan Note (Signed)
Stable period. Continue current regimen (wixela 1 puff bid and singulair with alb PRN)

## 2018-04-17 NOTE — Assessment & Plan Note (Signed)
Chronic, stable. A1c remains in controlled diabetes range. Discussed continuing to follow diabetic diet.

## 2018-04-22 ENCOUNTER — Telehealth: Payer: Self-pay | Admitting: Family Medicine

## 2018-04-22 NOTE — Telephone Encounter (Signed)
Noted  

## 2018-04-22 NOTE — Telephone Encounter (Signed)
Will be faxing over request for medication list and ov notes and blood work. Please be on the look out

## 2018-05-01 ENCOUNTER — Other Ambulatory Visit: Payer: Self-pay

## 2018-05-01 ENCOUNTER — Ambulatory Visit (INDEPENDENT_AMBULATORY_CARE_PROVIDER_SITE_OTHER): Payer: 59 | Admitting: Internal Medicine

## 2018-05-01 ENCOUNTER — Encounter: Payer: Self-pay | Admitting: Internal Medicine

## 2018-05-01 VITALS — BP 124/84 | HR 96 | Temp 98.0°F | Resp 14 | Ht 68.0 in | Wt 242.0 lb

## 2018-05-01 DIAGNOSIS — J4540 Moderate persistent asthma, uncomplicated: Secondary | ICD-10-CM | POA: Diagnosis not present

## 2018-05-01 DIAGNOSIS — J0140 Acute pansinusitis, unspecified: Secondary | ICD-10-CM

## 2018-05-01 MED ORDER — AMOXICILLIN 500 MG PO TABS: 1000.0000 mg | ORAL_TABLET | Freq: Two times a day (BID) | ORAL | 0 refills | Status: AC

## 2018-05-01 NOTE — Progress Notes (Signed)
Subjective:    Patient ID: Erik Allen, male    DOB: [birth year], 63 y.o.   MRN: [id number]  HPI Here due to respiratory symptoms  Started with head pressure--frontal and maxillary Hearing popping/feeling fluid in ears Sore throat Using over the counter meds---sudafed, mucinex. Don't help Started ~5 days ago Cough now---some sputum. Feels it going into his chest No fever, chills or sweats  No SOB No wheezing Using albuterol inhaler regularly  Current Outpatient Medications on File Prior to Visit  Medication Sig Dispense Refill  . albuterol (PROVENTIL) (2.5 MG/3ML) 0.083% nebulizer solution USE 1 VIAL VIA NEBULIZER EVERY 6 HOURS AS NEEDED 150 mL 3  . cholecalciferol (VITAMIN D) 1000 units tablet Take 2,000 Units by mouth daily.    . cyanocobalamin (V-R VITAMIN B-12) 500 MCG tablet Take 1 tablet (500 mcg total) by mouth daily.    Marland Kitchen dexlansoprazole (DEXILANT) 60 MG capsule Take 60 mg by mouth daily.    . finasteride (PROSCAR) 5 MG tablet TAKE 1 TABLET(5 MG) BY MOUTH DAILY 90 tablet 1  . Fluticasone-Salmeterol (WIXELA INHUB) 250-50 MCG/DOSE AEPB INHALE 1 PUFF BY MOUTH TWICE DAILY. RINSE MOUTH AFTER USE 60 each 3  . losartan-hydrochlorothiazide (HYZAAR) 100-25 MG tablet Take 1 tablet by mouth daily. 90 tablet 0  . metFORMIN (GLUCOPHAGE) 500 MG tablet Take 1 tablet (500 mg total) by mouth 2 (two) times daily with a meal. 180 tablet 0  . montelukast (SINGULAIR) 10 MG tablet Take 1 tablet (10 mg total) by mouth at bedtime. 90 tablet 1  . simvastatin (ZOCOR) 80 MG tablet Take 1 tablet (80 mg total) by mouth at bedtime. 90 tablet 0  . VENTOLIN HFA 108 (90 Base) MCG/ACT inhaler Inhale 2 puffs into the lungs every 6 (six) hours as needed for wheezing or shortness of breath. 18 g 11   No current facility-administered medications on file prior to visit.     Allergies  Allergen Reactions  . Ace Inhibitors Cough  . Codeine     REACTION: DIZZY / HEADACHE  . Tramadol     REACTION:  NAUSEA/ VOMITING    Past Medical History:  Diagnosis Date  . Aortic stenosis 06/21/2014   Echo 01/2018: Mild LVH, normal sys fxn EF 55-60%, G1DD, moderate AS with mildly dilated LA. rec yearly echocardiogram  . Asthma 09/13/99  . BENIGN PROSTATIC HYPERTROPHY 05/14/2003  . CAP (community acquired pneumonia) 01/31/2015  . Disorder of vocal cord 2012   h/o thrush on vocal cords per patient  . GERD (gastroesophageal reflux disease) 10/13/01   ENT eval for GERD and thrush 2011  . History of kidney stones   . HTN (hypertension)   . Hyperlipemia 10/13/97  . Seasonal allergic rhinitis    worse in spring  . Systolic murmur   . Transaminitis 2014   presumed fatty liver without [place], normal iron and viral hep panel    Past Surgical History:  Procedure Laterality Date  . A1 pulley release  10/2010   stensosing tenosynovitis, R milddle, ring, little fingers  . COLONOSCOPY  2010   WNL, rpt due 10 yrs  . CYSTECTOMY     from back  . CYSTOSCOPY  08/02   Stone retrieval  . EAR CYST EXCISION Left 08/02/2016   Procedure: EXCISION MUCOID CYST LEFT MIDDLE FINGER WITH DISTAL INTERPHALANGEAL JOINT [place];  Surgeon: Daryll Brod, MD;  Location: [facility];  Service: Orthopedics;  Laterality: Left;  . FOOT CAPSULE RELEASE W/ PERCUTANEOUS HEEL CORD LENGTHENING, TIBIAL TENDON  TRANSFER  04/09   bilateral, Dr. Milinda Pointer  . INGUINAL HERNIA REPAIR  06/28/09   left, with mesh, Dr. Bary Castilla  . ROTATION FLAP Left 08/02/2016   Procedure: ROTATION DORSAL FLAP;  Surgeon: Daryll Brod, MD;  Location: [facility];  Service: Orthopedics;  Laterality: Left;  . US ECHOCARDIOGRAPHY  09/01   TR, MR    Family History  Problem Relation Age of Onset  . Hypertension Mother   . Cancer Father 61       Lymphoma, recurrent  . Hypertension Father   . Hypertension Brother     Social History   Socioeconomic History  . Marital status: Single    Spouse name: Not on file  . Number of children: Not  on file  . Years of education: Not on file  . Highest education level: Not on file  Occupational History  . Occupation: TEAM LEADER    Employer: [employer]    Comment: Sheet metal fabrication  Social Needs  . Financial resource strain: Not on file  . Food insecurity:    Worry: Not on file    Inability: Not on file  . Transportation needs:    Medical: Not on file    Non-medical: Not on file  Tobacco Use  . Smoking status: Never Smoker  . Smokeless tobacco: Never Used  Substance and Sexual Activity  . Alcohol use: Yes    Alcohol/week: 1.0 - 2.0 standard drinks    Types: 1 - 2 Standard drinks or equivalent per week    Comment: occassionally  . Drug use: No  . Sexual activity: Yes  Lifestyle  . Physical activity:    Days per week: Not on file    Minutes per session: Not on file  . Stress: Not on file  Relationships  . Social connections:    Talks on phone: Not on file    Gets together: Not on file    Attends religious service: Not on file    Active member of club or organization: Not on file    Attends meetings of clubs or organizations: Not on file    Relationship status: Not on file  . Intimate partner violence:    Fear of current or ex partner: Not on file    Emotionally abused: Not on file    Physically abused: Not on file    Forced sexual activity: Not on file  Other Topics Concern  . 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 No distress.  HENT:  Mild maxillary > frontal tenderness Mild nasal congestion Mild pharyngeal injection without tonsillar enlargement TMs  normal  Neck: No thyromegaly present.  Slight tenderness along right neck (had enlarged gland there initially but not now)  Respiratory: Effort normal and breath sounds normal. No respiratory distress. He has no wheezes. He has no rales.           Assessment & Plan:

## 2018-05-01 NOTE — Assessment & Plan Note (Signed)
Will go ahead with Rx--amoxil analgesics

## 2018-05-01 NOTE — Assessment & Plan Note (Signed)
Not exacerbated Continue routine medications and albuterol

## 2018-05-01 NOTE — Addendum Note (Signed)
Addended by: Viviana Simpler I on: 05/01/2018 02:46 PM   Modules accepted: Orders

## 2018-06-20 DIAGNOSIS — K219 Gastro-esophageal reflux disease without esophagitis: Secondary | ICD-10-CM | POA: Diagnosis not present

## 2018-07-02 ENCOUNTER — Other Ambulatory Visit: Payer: Self-pay | Admitting: Family Medicine

## 2018-07-12 ENCOUNTER — Other Ambulatory Visit: Payer: Self-pay | Admitting: Family Medicine

## 2018-07-24 ENCOUNTER — Other Ambulatory Visit: Payer: Self-pay | Admitting: Family Medicine

## 2018-07-30 ENCOUNTER — Other Ambulatory Visit: Payer: Self-pay | Admitting: Family Medicine

## 2018-07-30 DIAGNOSIS — E559 Vitamin D deficiency, unspecified: Secondary | ICD-10-CM

## 2018-07-30 DIAGNOSIS — E785 Hyperlipidemia, unspecified: Secondary | ICD-10-CM

## 2018-07-30 DIAGNOSIS — E119 Type 2 diabetes mellitus without complications: Secondary | ICD-10-CM

## 2018-07-31 ENCOUNTER — Other Ambulatory Visit: Payer: 59

## 2018-08-04 ENCOUNTER — Other Ambulatory Visit: Payer: Self-pay | Admitting: Family Medicine

## 2018-08-04 ENCOUNTER — Encounter: Payer: 59 | Admitting: Family Medicine

## 2018-08-09 ENCOUNTER — Other Ambulatory Visit: Payer: Self-pay | Admitting: Family Medicine

## 2018-08-14 ENCOUNTER — Other Ambulatory Visit (INDEPENDENT_AMBULATORY_CARE_PROVIDER_SITE_OTHER): Payer: 59

## 2018-08-14 ENCOUNTER — Other Ambulatory Visit: Payer: Self-pay

## 2018-08-14 DIAGNOSIS — E559 Vitamin D deficiency, unspecified: Secondary | ICD-10-CM | POA: Diagnosis not present

## 2018-08-14 DIAGNOSIS — E119 Type 2 diabetes mellitus without complications: Secondary | ICD-10-CM | POA: Diagnosis not present

## 2018-08-14 DIAGNOSIS — E785 Hyperlipidemia, unspecified: Secondary | ICD-10-CM

## 2018-08-14 LAB — COMPREHENSIVE METABOLIC PANEL
ALT: 43 U/L (ref 0–53)
AST: 33 U/L (ref 0–37)
Albumin: 4.1 g/dL (ref 3.5–5.2)
Alkaline Phosphatase: 46 U/L (ref 39–117)
BUN: 19 mg/dL (ref 6–23)
CO2: 26 mEq/L (ref 19–32)
Calcium: 8.8 mg/dL (ref 8.4–10.5)
Chloride: 105 mEq/L (ref 96–112)
Creatinine, Ser: 0.95 mg/dL (ref 0.40–1.50)
GFR: 80.61 mL/min (ref >60.00)
Glucose, Bld: 98 mg/dL (ref 70–99)
Potassium: 3.9 mEq/L (ref 3.5–5.1)
Sodium: 140 mEq/L (ref 135–145)
Total Bilirubin: 0.3 mg/dL (ref 0.2–1.2)
Total Protein: 6.9 g/dL (ref 6.0–8.3)

## 2018-08-14 LAB — VITAMIN D 25 HYDROXY (VIT D DEFICIENCY, FRACTURES): VITD: 62.54 ng/mL (ref 30.00–100.00)

## 2018-08-14 LAB — LIPID PANEL
Cholesterol: 158 mg/dL (ref 0–200)
HDL: 55.1 mg/dL (ref >39.00)
LDL Cholesterol: 72 mg/dL (ref 0–99)
NonHDL: 102.59
Total CHOL/HDL Ratio: 3
Triglycerides: 151 mg/dL — ABNORMAL HIGH (ref 0.0–149.0)
VLDL: 30.2 mg/dL (ref 0.0–40.0)

## 2018-08-14 LAB — HEMOGLOBIN A1C: Hgb A1c MFr Bld: 6.4 % (ref 4.6–6.5)

## 2018-08-18 ENCOUNTER — Other Ambulatory Visit: Payer: Self-pay

## 2018-08-18 ENCOUNTER — Encounter: Payer: Self-pay | Admitting: Family Medicine

## 2018-08-18 ENCOUNTER — Ambulatory Visit (INDEPENDENT_AMBULATORY_CARE_PROVIDER_SITE_OTHER)
Admission: RE | Admit: 2018-08-18 | Discharge: 2018-08-18 | Disposition: A | Discharge status: Home / Self Care | Payer: 59 | Source: Ambulatory Visit | Attending: Family Medicine | Admitting: Family Medicine

## 2018-08-18 ENCOUNTER — Ambulatory Visit (INDEPENDENT_AMBULATORY_CARE_PROVIDER_SITE_OTHER): Payer: 59 | Admitting: Family Medicine

## 2018-08-18 VITALS — BP 136/80 | HR 86 | Temp 98.6°F | Ht 68.0 in | Wt 234.6 lb

## 2018-08-18 DIAGNOSIS — Z0000 Encounter for general adult medical examination without abnormal findings: Secondary | ICD-10-CM

## 2018-08-18 DIAGNOSIS — J4540 Moderate persistent asthma, uncomplicated: Secondary | ICD-10-CM

## 2018-08-18 DIAGNOSIS — M25551 Pain in right hip: Secondary | ICD-10-CM

## 2018-08-18 DIAGNOSIS — N400 Enlarged prostate without lower urinary tract symptoms: Secondary | ICD-10-CM

## 2018-08-18 DIAGNOSIS — M5416 Radiculopathy, lumbar region: Secondary | ICD-10-CM

## 2018-08-18 DIAGNOSIS — E785 Hyperlipidemia, unspecified: Secondary | ICD-10-CM | POA: Diagnosis not present

## 2018-08-18 DIAGNOSIS — E119 Type 2 diabetes mellitus without complications: Secondary | ICD-10-CM

## 2018-08-18 DIAGNOSIS — I10 Essential (primary) hypertension: Secondary | ICD-10-CM

## 2018-08-18 DIAGNOSIS — I350 Nonrheumatic aortic (valve) stenosis: Secondary | ICD-10-CM

## 2018-08-18 NOTE — Assessment & Plan Note (Signed)
Preventative protocols reviewed and updated unless pt declined. Discussed healthy diet and lifestyle.  

## 2018-08-18 NOTE — Assessment & Plan Note (Signed)
Chronic, stable on finasteride. Continue. Endorses no LUTS.

## 2018-08-18 NOTE — Progress Notes (Signed)
This visit was conducted in person.  BP 136/80 (BP Location: Left Arm, Patient Position: Sitting, Cuff Size: Large)   Pulse 86   Temp 98.6 F (37 C) (Tympanic)   Ht 5\' 8"  (1.727 m)   Wt 234 lb 9 oz (106.4 kg)   SpO2 95%   BMI 35.67 kg/m    CC: CPE Subjective:    Patient ID: Erik Allen, male    DOB: [birth year], 63 y.o.   MRN: [id number]  HPI: Erik Allen is a 63 y.o. male presenting on 08/18/2018 for Annual Exam   R hip pain present for 6 months. Wakes him up as he sleeps on his sides. Points to buttock as site of pain.   Asthma - struggles during humid months. Has needed albuterol nebulizer/inhaler several times this month - ie to sleep at night. Continues dexilant, advair, singulair. Did not feel flonase was very effective.  Preventative: Colonoscopy - 2010 WNL, rpt due 10 yrs. ENT has suggested he get EGD.  Prostate - yearly screen, normal. Not strong stream but "good stream", nocturia x1-3.Known BPH on proscar.  Flu shotyearly Pneumovax 2014 Tdap 2016 Seat belt use discussed Sunscreen use discussed, no changing moles on skin.Uses UV protection shirts.  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 Interim medical history since our last visit reviewed. Allergies and medications reviewed and updated. Outpatient Medications Prior to Visit  Medication Sig Dispense Refill  . albuterol (PROVENTIL) (2.5 MG/3ML) 0.083% nebulizer solution USE 1 VIAL VIA NEBULIZER EVERY 6 HOURS AS NEEDED 150 mL 3  . cholecalciferol (VITAMIN D) 1000 units tablet Take 2,000 Units by mouth daily.    . cyanocobalamin (V-R VITAMIN B-12)  500 MCG tablet Take 1 tablet (500 mcg total) by mouth daily.    Marland Kitchen dexlansoprazole (DEXILANT) 60 MG capsule Take 60 mg by mouth daily.    . finasteride (PROSCAR) 5 MG tablet TAKE 1 TABLET(5 MG) BY MOUTH DAILY 90 tablet 1  . Fluticasone-Salmeterol (ADVAIR DISKUS) 250-50 MCG/DOSE AEPB INHALE 1 PUFF BY MOUTH TWICE DAILY. RINSE MOUTH AFTER USE 180 each 1  . losartan-hydrochlorothiazide (HYZAAR) 100-25 MG tablet Take 1 tablet by mouth daily. 90 tablet 0  . metFORMIN (GLUCOPHAGE) 500 MG tablet TAKE 1 TABLET (500 MG TOTAL) BY MOUTH 2 (TWO) TIMES DAILY WITH A MEAL. 180 tablet 0  . montelukast (SINGULAIR) 10 MG tablet Please specify directions, refills and quantity 90 tablet 0  . simvastatin (ZOCOR) 80 MG tablet TAKE 1 TABLET BY MOUTH EVERYDAY AT BEDTIME 90 tablet 0  . VENTOLIN HFA 108 (90 Base) MCG/ACT inhaler Inhale 2 puffs into the lungs every 6 (six) hours as needed for wheezing or shortness of breath. 18 g 11   No facility-administered medications prior to visit.      Per HPI unless specifically indicated in ROS section below Review of Systems  Constitutional: Negative for activity change, appetite change, chills, fatigue, fever and unexpected weight change.  HENT: Negative for hearing loss.   Eyes: Negative for visual disturbance.  Respiratory: Positive for cough and wheezing. Negative for chest tightness and shortness of breath.   Cardiovascular: Negative for chest pain, palpitations and leg swelling.  Gastrointestinal: Negative for abdominal distention, abdominal pain, blood in stool, constipation, diarrhea, nausea and vomiting.  Genitourinary: Negative for difficulty urinating and hematuria.  Musculoskeletal: Positive for arthralgias (R hip). Negative for myalgias and neck pain.  Skin: Negative for rash.  Neurological: Negative for dizziness, seizures, syncope and headaches.  Hematological: Negative for adenopathy. Does not bruise/bleed easily.  Psychiatric/Behavioral: Negative for dysphoric  mood. The patient is not nervous/anxious.    Objective:    BP 136/80 (BP Location: Left Arm, Patient Position: Sitting, Cuff Size: Large)   Pulse 86   Temp 98.6 F (37 C) (Tympanic)   Ht 5\' 8"  (1.727 m)   Wt 234 lb 9 oz (106.4 kg)   SpO2 95%   BMI 35.67 kg/m   Wt Readings from Last 3 Encounters:  08/18/18 234 lb 9 oz (106.4 kg)  05/01/18 242 lb (109.8 kg)  04/17/18 239 lb 6 oz (108.6 kg)    Physical Exam Vitals signs and nursing note reviewed.  Constitutional:      General: He is not in acute distress.    Appearance: Normal appearance. He is well-developed. He is not ill-appearing.  HENT:     Head: Normocephalic and atraumatic.     Right Ear: Hearing, tympanic membrane, ear canal and external ear normal.     Left Ear: Hearing, tympanic membrane, ear canal and external ear normal.     Nose: Nose normal.     Mouth/Throat:     Mouth: Mucous membranes are moist.     Pharynx: Uvula midline. No oropharyngeal exudate or posterior oropharyngeal erythema.  Eyes:     General: No scleral icterus.    Extraocular Movements: Extraocular movements intact.     Conjunctiva/sclera: Conjunctivae normal.     Pupils: Pupils are equal, round, and reactive to light.  Neck:     Musculoskeletal: Normal range of motion and neck supple.  Cardiovascular:     Rate and Rhythm: Normal rate and regular rhythm.     Pulses: Normal pulses.          Radial pulses are 2+ on the right side and 2+ on the left side.     Heart sounds: Murmur (4/6 systolic) present.  Pulmonary:     Effort: Pulmonary effort is normal. No respiratory distress.     Breath sounds: Normal breath sounds. No wheezing, rhonchi or rales.  Abdominal:     General: Bowel sounds are normal. There is no distension.     Palpations: Abdomen is soft. There is no mass.     Tenderness: There is no abdominal tenderness. There is no guarding or rebound.  Genitourinary:    Prostate: Normal. Not enlarged (15gm), not tender and no nodules present.      Rectum: Normal. No mass, tenderness, anal fissure, external hemorrhoid or internal hemorrhoid. Normal anal tone.  Musculoskeletal: Normal range of motion.     Right lower leg: No edema.     Left lower leg: No edema.     Comments: Discomfort with SLR on right No significant pain with int/ext rotation at hip. Neg FABER. No pain at SIJ, GTB or sciatic notch bilaterally.   Lymphadenopathy:     Cervical: No cervical adenopathy.  Skin:    General: Skin is warm and dry.     Findings: No rash.  Neurological:     Mental Status: He is alert and oriented to person, place, and time.     Deep Tendon Reflexes:     Reflex Scores:  Patellar reflexes are 2+ on the right side and 2+ on the left side.    Comments:  CN grossly intact, station and gait intact 5/5 strength BLE  Psychiatric:        Mood and Affect: Mood normal.        Behavior: Behavior normal.        Thought Content: Thought content normal.        Judgment: Judgment normal.       Results for orders placed or performed in visit on 08/14/18  VITAMIN D 25 Hydroxy (Vit-D Deficiency, Fractures)  Result Value Ref Range   VITD 62.54 30.00 - 100.00 ng/mL  Hemoglobin A1c  Result Value Ref Range   Hgb A1c MFr Bld 6.4 4.6 - 6.5 %  Comprehensive metabolic panel  Result Value Ref Range   Sodium 140 135 - 145 mEq/L   Potassium 3.9 3.5 - 5.1 mEq/L   Chloride 105 96 - 112 mEq/L   CO2 26 19 - 32 mEq/L   Glucose, Bld 98 70 - 99 mg/dL   BUN 19 6 - 23 mg/dL   Creatinine, Ser 0.95 0.40 - 1.50 mg/dL   Total Bilirubin 0.3 0.2 - 1.2 mg/dL   Alkaline Phosphatase 46 39 - 117 U/L   AST 33 0 - 37 U/L   ALT 43 0 - 53 U/L   Total Protein 6.9 6.0 - 8.3 g/dL   Albumin 4.1 3.5 - 5.2 g/dL   Calcium 8.8 8.4 - 10.5 mg/dL   GFR 80.61 >60.00 mL/min  Lipid panel  Result Value Ref Range   Cholesterol 158 0 - 200 mg/dL   Triglycerides 151.0 (H) 0.0 - 149.0 mg/dL   HDL 55.10 >39.00 mg/dL   VLDL 30.2 0.0 - 40.0 mg/dL   LDL Cholesterol 72 0 - 99  mg/dL   Total CHOL/HDL Ratio 3    NonHDL 102.59    Assessment & Plan:   Problem List Items Addressed This Visit    Severe obesity (BMI 35.0-39.9) with comorbidity ([place])    Congratulated on weight loss noted. Encouraged ongoing efforts at weight loss.       Lumbar back pain with radiculopathy affecting right lower extremity    Anticipate acute flare of radiculopathy. Exam today not consistent with hip pathology. Check hip films r/o arthritis contributing.       Relevant Orders   DG Hip Unilat W OR W/O Pelvis 2-3 Views Right   HLD (hyperlipidemia)    Chronic, stable. Continue current regimen.  The 10-year ASCVD risk score Mikey Bussing DC Brooke Bonito., et al., 2013) is: 15.6%   Values used to calculate the score:     Age: 31 years     Sex: Male     Is Non-Hispanic African American: No     Diabetic: Yes     Tobacco smoker: No     Systolic Blood Pressure: 419 mmHg     Is BP treated: Yes     HDL Cholesterol: 55.1 mg/dL     Total Cholesterol: 158 mg/dL       Healthcare maintenance - Primary    Preventative protocols reviewed and updated unless pt declined. Discussed healthy diet and lifestyle.       Essential hypertension    Chronic, stable. Continue current regimen.       Controlled type 2 diabetes mellitus without complication, without long-term current use of insulin (HCC)    Chronic, stable. Continue diet control.        Benign prostatic hyperplasia  Chronic, stable on finasteride. Continue. Endorses no LUTS.      Asthma    Chronic. Not in acute exacerbation. Does worse in the summer months. Continue current regimen.       Aortic stenosis    Chronic, stable. rec yearly monitoring. Next due 01/2019.       Other Visit Diagnoses    Right hip pain       Relevant Orders   DG Hip Unilat W OR W/O Pelvis 2-3 Views Right       No orders of the defined types were placed in this encounter.  Orders Placed This Encounter  Procedures  . DG Hip Unilat W OR W/O Pelvis 2-3 Views  Right    Standing Status:   Future    Number of Occurrences:   1    Standing Expiration Date:   10/19/2019    Order Specific Question:   Reason for Exam (SYMPTOM  OR DIAGNOSIS REQUIRED)    Answer:   R hip and buttock pain    Order Specific Question:   Preferred imaging location?    Answer:   [facility]    Order Specific Question:   Radiology Contrast Protocol - do NOT remove file path    Answer:   [email or web address]    Follow up plan: Return in about 4 months (around 12/19/2018) for follow up visit.  Ria Bush, MD

## 2018-08-18 NOTE — Assessment & Plan Note (Signed)
Congratulated on weight loss noted. Encouraged ongoing efforts at weight loss.

## 2018-08-18 NOTE — Assessment & Plan Note (Signed)
Chronic, stable. rec yearly monitoring. Next due 01/2019.

## 2018-08-18 NOTE — Patient Instructions (Addendum)
We will refer you for repeat colonoscopy.  I think you have right sided sciatica. Xray of R hip today to rule out hip arthritis.  Return in 3-4 months for follow up visit.   Health Maintenance, Male Adopting a healthy lifestyle and getting preventive care are important in promoting health and wellness. Ask your health care provider about:  The right schedule for you to have regular tests and exams.  Things you can do on your own to prevent diseases and keep yourself healthy. What should I know about diet, weight, and exercise? Eat a healthy diet   Eat a diet that includes plenty of vegetables, fruits, low-fat dairy products, and lean protein.  Do not eat a lot of foods that are high in solid fats, added sugars, or sodium. Maintain a healthy weight Body mass index (BMI) is a measurement that can be used to identify possible weight problems. It estimates body fat based on height and weight. Your health care provider can help determine your BMI and help you achieve or maintain a healthy weight. Get regular exercise Get regular exercise. This is one of the most important things you can do for your health. Most adults should:  Exercise for at least 150 minutes each week. The exercise should increase your heart rate and make you sweat (moderate-intensity exercise).  Do strengthening exercises at least twice a week. This is in addition to the moderate-intensity exercise.  Spend less time sitting. Even light physical activity can be beneficial. Watch cholesterol and blood lipids Have your blood tested for lipids and cholesterol at 61 years of age, then have this test every 5 years. You may need to have your cholesterol levels checked more often if:  Your lipid or cholesterol levels are high.  You are older than 61 years of age.  You are at high risk for heart disease. What should I know about cancer screening? Many types of cancers can be detected early and may often be prevented.  Depending on your health history and family history, you may need to have cancer screening at various ages. This may include screening for:  Colorectal cancer.  Prostate cancer.  Skin cancer.  Lung cancer. What should I know about heart disease, diabetes, and high blood pressure? Blood pressure and heart disease  High blood pressure causes heart disease and increases the risk of stroke. This is more likely to develop in people who have high blood pressure readings, are of African descent, or are overweight.  Talk with your health care provider about your target blood pressure readings.  Have your blood pressure checked: ? Every 3-5 years if you are 53-41 years of age. ? Every year if you are 74 years old or older.  If you are between the ages of 75 and 52 and are a current or former smoker, ask your health care provider if you should have a one-time screening for abdominal aortic aneurysm (AAA). Diabetes Have regular diabetes screenings. This checks your fasting blood sugar level. Have the screening done:  Once every three years after age 72 if you are at a normal weight and have a low risk for diabetes.  More often and at a younger age if you are overweight or have a high risk for diabetes. What should I know about preventing infection? Hepatitis B If you have a higher risk for hepatitis B, you should be screened for this virus. Talk with your health care provider to find out if you are at risk for hepatitis  B infection. Hepatitis C Blood testing is recommended for:  Everyone born from [birth year] through 1963.  Anyone with known risk factors for hepatitis C. Sexually transmitted infections (STIs)  You should be screened each year for STIs, including gonorrhea and chlamydia, if: ? You are sexually active and are younger than 60 years of age. ? You are older than 61 years of age and your health care provider tells you that you are at risk for this type of infection. ? Your sexual  activity has changed since you were last screened, and you are at increased risk for chlamydia or gonorrhea. Ask your health care provider if you are at risk.  Ask your health care provider about whether you are at high risk for HIV. Your health care provider may recommend a prescription medicine to help prevent HIV infection. If you choose to take medicine to prevent HIV, you should first get tested for HIV. You should then be tested every 3 months for as long as you are taking the medicine. Follow these instructions at home: Lifestyle  Do not use any products that contain nicotine or tobacco, such as cigarettes, e-cigarettes, and chewing tobacco. If you need help quitting, ask your health care provider.  Do not use street drugs.  Do not share needles.  Ask your health care provider for help if you need support or information about quitting drugs. Alcohol use  Do not drink alcohol if your health care provider tells you not to drink.  If you drink alcohol: ? Limit how much you have to 0-2 drinks a day. ? Be aware of how much alcohol is in your drink. In the [place] one drink equals one 12 oz bottle of beer (355 mL), one 5 oz glass of wine (148 mL), or one 1 oz glass of hard liquor (44 mL). General instructions  Schedule regular health, dental, and eye exams.  Stay current with your vaccines.  Tell your health care provider if: ? You often feel depressed. ? You have ever been abused or do not feel safe at home. Summary  Adopting a healthy lifestyle and getting preventive care are important in promoting health and wellness.  Follow your health care provider's instructions about healthy diet, exercising, and getting tested or screened for diseases.  Follow your health care provider's instructions on monitoring your cholesterol and blood pressure. This information is not intended to replace advice given to you by your health care provider. Make sure you discuss any questions you have  with your health care provider. Document Released: 07/28/2007 Document Revised: 01/22/2018 Document Reviewed: 01/22/2018 Elsevier Patient Education  2020 [employer].

## 2018-08-18 NOTE — Assessment & Plan Note (Signed)
Chronic, stable. Continue current regimen. 

## 2018-08-18 NOTE — Assessment & Plan Note (Addendum)
Chronic, stable. Continue current regimen.  The 10-year ASCVD risk score Mikey Bussing DC Brooke Bonito., et al., 2013) is: 15.6%   Values used to calculate the score:     Age: 63 years     Sex: Male     Is Non-Hispanic African American: No     Diabetic: Yes     Tobacco smoker: No     Systolic Blood Pressure: 964 mmHg     Is BP treated: Yes     HDL Cholesterol: 55.1 mg/dL     Total Cholesterol: 158 mg/dL

## 2018-08-18 NOTE — Assessment & Plan Note (Signed)
Chronic, stable. Continue diet control.  

## 2018-08-18 NOTE — Assessment & Plan Note (Signed)
Anticipate acute flare of radiculopathy. Exam today not consistent with hip pathology. Check hip films r/o arthritis contributing.

## 2018-08-18 NOTE — Assessment & Plan Note (Signed)
Chronic. Not in acute exacerbation. Does worse in the summer months. Continue current regimen.

## 2018-08-21 ENCOUNTER — Other Ambulatory Visit: Payer: Self-pay | Admitting: Family Medicine

## 2018-09-04 ENCOUNTER — Other Ambulatory Visit: Payer: Self-pay | Admitting: Family Medicine

## 2018-09-23 ENCOUNTER — Other Ambulatory Visit: Payer: Self-pay | Admitting: Family Medicine

## 2018-10-06 ENCOUNTER — Other Ambulatory Visit: Payer: Self-pay | Admitting: Family Medicine

## 2018-12-05 ENCOUNTER — Ambulatory Visit (INDEPENDENT_AMBULATORY_CARE_PROVIDER_SITE_OTHER): Payer: 59 | Admitting: Family Medicine

## 2018-12-05 ENCOUNTER — Other Ambulatory Visit: Payer: Self-pay

## 2018-12-05 ENCOUNTER — Encounter: Payer: Self-pay | Admitting: Family Medicine

## 2018-12-05 VITALS — BP 140/76 | HR 93 | Temp 98.2°F | Ht 68.0 in | Wt 236.6 lb

## 2018-12-05 DIAGNOSIS — E119 Type 2 diabetes mellitus without complications: Secondary | ICD-10-CM | POA: Diagnosis not present

## 2018-12-05 DIAGNOSIS — I350 Nonrheumatic aortic (valve) stenosis: Secondary | ICD-10-CM

## 2018-12-05 LAB — POCT GLYCOSYLATED HEMOGLOBIN (HGB A1C): Hemoglobin A1C: 6.1 % — AB (ref 4.0–5.6)

## 2018-12-05 NOTE — Progress Notes (Signed)
This visit was conducted in person.  BP 140/76 (BP Location: Left Arm, Patient Position: Sitting, Cuff Size: Large)   Pulse 93   Temp 98.2 F (36.8 C) (Temporal)   Ht 5\' 8"  (1.727 m)   Wt 236 lb 9 oz (107.3 kg)   SpO2 95%   BMI 35.97 kg/m    CC: DM f/u visit Subjective:    Patient ID: Erik Allen, male    DOB: May 09, 1955, 63 y.o.   MRN: [id number]  HPI: Erik Allen is a 63 y.o. male presenting on 12/05/2018 for Follow-up (Here for 3-4 mo f/u.)   DM - does not regularly check sugars. Compliant with antihyperglycemic regimen which includes: metformin 500mg  bid. Denies low sugars or hypoglycemic symptoms. Denies paresthesias. Last diabetic eye exam 12/2017 - has appt next month. Pneumovax: 2014. Prevnar: not due. Glucometer brand: does not have at home. DSME: declines, would like to discuss next year. Lab Results  Component Value Date   HGBA1C 6.1 (A) 12/05/2018   Diabetic Foot Exam - Simple   Simple Foot Form Diabetic Foot exam was performed with the following findings: Yes 12/05/2018  2:18 PM  Visual Inspection No deformities, no ulcerations, no other skin breakdown bilaterally: Yes Sensation Testing Intact to touch and monofilament testing bilaterally: Yes Pulse Check See comments: Yes Comments Mildly diminished pedal pulses BLE    Lab Results  Component Value Date   MICROALBUR 2.6 (H) 07/09/2017    Colonoscopy - wants to schedule for 2021.      Relevant past medical, surgical, family and social history reviewed and updated as indicated. Interim medical history since our last visit reviewed. Allergies and medications reviewed and updated. Outpatient Medications Prior to Visit  Medication Sig Dispense Refill  . albuterol (PROVENTIL) (2.5 MG/3ML) 0.083% nebulizer solution USE 1 VIAL VIA NEBULIZER EVERY 6 HOURS AS NEEDED 150 mL 3  . cholecalciferol (VITAMIN D) 1000 units tablet Take 2,000 Units by mouth daily.    . cyanocobalamin (V-R VITAMIN B-12) 500 MCG  tablet Take 1 tablet (500 mcg total) by mouth daily.    Marland Kitchen dexlansoprazole (DEXILANT) 60 MG capsule Take 60 mg by mouth daily.    . finasteride (PROSCAR) 5 MG tablet TAKE 1 TABLET BY MOUTH EVERY DAY 90 tablet 1  . Fluticasone-Salmeterol (ADVAIR DISKUS) 250-50 MCG/DOSE AEPB INHALE 1 PUFF BY MOUTH TWICE DAILY. RINSE MOUTH AFTER USE 180 each 1  . losartan-hydrochlorothiazide (HYZAAR) 100-25 MG tablet TAKE 1 TABLET BY MOUTH EVERY DAY 90 tablet 3  . metFORMIN (GLUCOPHAGE) 500 MG tablet TAKE 1 TABLET (500 MG TOTAL) BY MOUTH 2 (TWO) TIMES DAILY WITH A MEAL. 180 tablet 0  . montelukast (SINGULAIR) 10 MG tablet TAKE 1 TABLET BY MOUTH EVERY NIGHT AT BEDTIME 90 tablet 2  . simvastatin (ZOCOR) 80 MG tablet TAKE 1 TABLET BY MOUTH EVERYDAY AT BEDTIME 90 tablet 3  . VENTOLIN HFA 108 (90 Base) MCG/ACT inhaler Inhale 2 puffs into the lungs every 6 (six) hours as needed for wheezing or shortness of breath. 18 g 11   No facility-administered medications prior to visit.      Per HPI unless specifically indicated in ROS section below Review of Systems Objective:    BP 140/76 (BP Location: Left Arm, Patient Position: Sitting, Cuff Size: Large)   Pulse 93   Temp 98.2 F (36.8 C) (Temporal)   Ht 5\' 8"  (1.727 m)   Wt 236 lb 9 oz (107.3 kg)   SpO2 95%   BMI 35.97 kg/m  Wt Readings from Last 3 Encounters:  12/05/18 236 lb 9 oz (107.3 kg)  08/18/18 234 lb 9 oz (106.4 kg)  05/01/18 242 lb (109.8 kg)    Physical Exam Vitals signs and nursing note reviewed.  Constitutional:      General: He is not in acute distress.    Appearance: Normal appearance. He is obese. He is not ill-appearing.  HENT:     Mouth/Throat:     Mouth: Mucous membranes are moist.     Pharynx: Oropharynx is clear. No posterior oropharyngeal erythema.  Cardiovascular:     Rate and Rhythm: Regular rhythm.     Pulses: Normal pulses.     Heart sounds: Murmur (4/6 systolic throughout) present.  Pulmonary:     Effort: Pulmonary effort is  normal. No respiratory distress.     Breath sounds: Normal breath sounds. No wheezing, rhonchi or rales.  Musculoskeletal:     Right lower leg: No edema.     Left lower leg: No edema.     Comments: See HPI for diabetic foot exam if done  Neurological:     Mental Status: He is alert.  Psychiatric:        Mood and Affect: Mood normal.        Behavior: Behavior normal.       Results for orders placed or performed in visit on 12/05/18  POCT glycosylated hemoglobin (Hb A1C)  Result Value Ref Range   Hemoglobin A1C 6.1 (A) 4.0 - 5.6 %   HbA1c POC (<> result, manual entry)     HbA1c, POC (prediabetic range)     HbA1c, POC (controlled diabetic range)     Assessment & Plan:   Problem List Items Addressed This Visit    Controlled type 2 diabetes mellitus without complication, without long-term current use of insulin (HCC) - Primary    Chronic, stable to improved - continue current regimen of metformin 500mg  bid. He has scheduled upcoming eye exam. Foot exam today. Declines DSME today "will consider next year".       Relevant Orders   POCT glycosylated hemoglobin (Hb A1C) (Completed)   Aortic stenosis    Due for yearly [place] to monitor aortic stenosis - will order for 01/2019 He states he did have "slight case of" scarlet fever as a child.       Relevant Orders   ECHOCARDIOGRAM COMPLETE       No orders of the defined types were placed in this encounter.  Orders Placed This Encounter  Procedures  . POCT glycosylated hemoglobin (Hb A1C)  . ECHOCARDIOGRAM COMPLETE    Standing Status:   Future    Standing Expiration Date:   03/06/2020    Scheduling Instructions:     Schedule after 02/04/2019    Order Specific Question:   Where should this test be performed    Answer:   [facility]    Order Specific Question:   Perflutren DEFINITY (image enhancing agent) should be administered unless hypersensitivity or allergy exist    Answer:   Administer Perflutren    Order Specific Question:    Reason for exam-Echo    Answer:   Murmur  785.2 / R01.1    Order Specific Question:   Reason for exam-Echo    Answer:   Aortic Stenosis 424.1 / 135.0    Patient Instructions  Check with pharmacy on insurance preferred glucose meter brand.  Upcoming eye exam and colonoscopy.  We will order heart ultrasound to monitor aortic stenosis.  Good  to see you today, call us with questions. Return as needed or in 5 months for follow up visit.    Follow up plan: Return in about 5 months (around 05/05/2019) for follow up visit.  Ria Bush, MD

## 2018-12-05 NOTE — Patient Instructions (Addendum)
Check with pharmacy on insurance preferred glucose meter brand.  Upcoming eye exam and colonoscopy.  We will order heart ultrasound to monitor aortic stenosis.  Good to see you today, call us with questions. Return as needed or in 5 months for follow up visit.

## 2018-12-05 NOTE — Assessment & Plan Note (Addendum)
Due for yearly [place] to monitor aortic stenosis - will order for 01/2019 He states he did have "slight case of" scarlet fever as a child.

## 2018-12-05 NOTE — Assessment & Plan Note (Addendum)
Chronic, stable to improved - continue current regimen of metformin 500mg  bid. He has scheduled upcoming eye exam. Foot exam today. Declines DSME today "will consider next year".

## 2018-12-09 ENCOUNTER — Other Ambulatory Visit: Payer: Self-pay

## 2018-12-09 DIAGNOSIS — E119 Type 2 diabetes mellitus without complications: Secondary | ICD-10-CM

## 2018-12-09 MED ORDER — ACCU-CHEK GUIDE VI STRP: ORAL_STRIP | 1 refills | Status: DC

## 2018-12-09 MED ORDER — ACCU-CHEK FASTCLIX LANCETS MISC: 1 refills | Status: DC

## 2018-12-09 MED ORDER — ACCU-CHEK GUIDE W/DEVICE KIT: 1.0000 | PACK | 0 refills | Status: DC

## 2018-12-09 NOTE — Telephone Encounter (Signed)
Received faxed rx request from [facility] for Accu-Chek Guide meter, test strips and  Accu-Chek Fastclix lancets.  E-scribed rxs

## 2019-01-10 ENCOUNTER — Other Ambulatory Visit: Payer: Self-pay | Admitting: Family Medicine

## 2019-01-10 DIAGNOSIS — E119 Type 2 diabetes mellitus without complications: Secondary | ICD-10-CM

## 2019-01-12 NOTE — Telephone Encounter (Signed)
Pt's chart shows rx for meter was just sent on 12/09/18.  Spoke with pt asking about request.  States he does not need new meter.  Declined rx.

## 2019-01-26 ENCOUNTER — Other Ambulatory Visit: Payer: Self-pay | Admitting: Family Medicine

## 2019-01-31 ENCOUNTER — Other Ambulatory Visit: Payer: Self-pay | Admitting: Family Medicine

## 2019-02-09 ENCOUNTER — Other Ambulatory Visit: Payer: Self-pay | Admitting: Family Medicine

## 2019-02-09 DIAGNOSIS — E119 Type 2 diabetes mellitus without complications: Secondary | ICD-10-CM

## 2019-02-16 ENCOUNTER — Other Ambulatory Visit: Payer: 59

## 2019-02-21 ENCOUNTER — Other Ambulatory Visit: Payer: Self-pay | Admitting: Family Medicine

## 2019-02-22 ENCOUNTER — Other Ambulatory Visit: Payer: Self-pay | Admitting: Family Medicine

## 2019-02-23 NOTE — Telephone Encounter (Signed)
Message from [facility] stating pt paying $199 for losartan-HCTZ.  Requesting alternative, valsartan-HCTZ, pt cost $41.

## 2019-02-24 NOTE — Telephone Encounter (Signed)
Sent in

## 2019-03-13 ENCOUNTER — Other Ambulatory Visit: Payer: Self-pay

## 2019-03-13 ENCOUNTER — Ambulatory Visit (INDEPENDENT_AMBULATORY_CARE_PROVIDER_SITE_OTHER): Payer: BC Managed Care – PPO

## 2019-03-13 DIAGNOSIS — I350 Nonrheumatic aortic (valve) stenosis: Secondary | ICD-10-CM | POA: Diagnosis not present

## 2019-03-13 MED ORDER — PERFLUTREN LIPID MICROSPHERE
1.0000 mL | INTRAVENOUS | Status: AC | PRN
  Administered 2019-03-13: 2 mL via INTRAVENOUS

## 2019-03-22 ENCOUNTER — Other Ambulatory Visit: Payer: Self-pay | Admitting: Family Medicine

## 2019-03-22 DIAGNOSIS — I350 Nonrheumatic aortic (valve) stenosis: Secondary | ICD-10-CM

## 2019-03-23 ENCOUNTER — Telehealth: Payer: Self-pay

## 2019-03-23 NOTE — Telephone Encounter (Signed)
LVM for pt to call for result and PCP msg.  Msg below  Ria Bush, MD  Goins, [place], CMA  Cc: Haynes Bast  Plz notify heart ultrasound returned showing normal heart pumping function and moderately to severe aortic stenosis and calcification - given progression of aortic stenosis, recommend f/u with cardiology and referral placed.

## 2019-03-30 ENCOUNTER — Other Ambulatory Visit: Payer: Self-pay

## 2019-03-30 ENCOUNTER — Other Ambulatory Visit
Admission: RE | Admit: 2019-03-30 | Discharge: 2019-03-30 | Disposition: A | Discharge status: Home / Self Care | Payer: BC Managed Care – PPO | Source: Ambulatory Visit | Attending: Cardiology | Admitting: Cardiology

## 2019-03-30 ENCOUNTER — Encounter: Payer: Self-pay | Admitting: Cardiology

## 2019-03-30 ENCOUNTER — Ambulatory Visit (INDEPENDENT_AMBULATORY_CARE_PROVIDER_SITE_OTHER): Payer: BC Managed Care – PPO | Admitting: Cardiology

## 2019-03-30 VITALS — BP 142/92 | HR 7 | Ht 70.0 in | Wt 239.5 lb

## 2019-03-30 DIAGNOSIS — I10 Essential (primary) hypertension: Secondary | ICD-10-CM | POA: Diagnosis not present

## 2019-03-30 DIAGNOSIS — I060 Rheumatic aortic stenosis: Secondary | ICD-10-CM | POA: Diagnosis present

## 2019-03-30 DIAGNOSIS — E7800 Pure hypercholesterolemia, unspecified: Secondary | ICD-10-CM | POA: Diagnosis not present

## 2019-03-30 LAB — BASIC METABOLIC PANEL
Anion gap: 7 (ref 5–15)
BUN: 23 mg/dL (ref 8–23)
CO2: 31 mmol/L (ref 22–32)
Calcium: 9.8 mg/dL (ref 8.9–10.3)
Chloride: 100 mmol/L (ref 98–111)
Creatinine, Ser: 1.06 mg/dL (ref 0.61–1.24)
GFR calc Af Amer: >60 mL/min (ref >60)
GFR calc non Af Amer: >60 mL/min (ref >60)
Glucose, Bld: 106 mg/dL — ABNORMAL HIGH (ref 70–99)
Potassium: 4 mmol/L (ref 3.5–5.1)
Sodium: 138 mmol/L (ref 135–145)

## 2019-03-30 LAB — CBC WITH DIFFERENTIAL/PLATELET
Abs Immature Granulocytes: 0.04 10*3/uL (ref 0.00–0.07)
Basophils Absolute: 0.1 10*3/uL (ref 0.0–0.1)
Basophils Relative: 0 %
Eosinophils Absolute: 0.1 10*3/uL (ref 0.0–0.5)
Eosinophils Relative: 1 %
HCT: 43.1 % (ref 39.0–52.0)
Hemoglobin: 14.2 g/dL (ref 13.0–17.0)
Immature Granulocytes: 0 %
Lymphocytes Relative: 27 %
Lymphs Abs: 3.1 10*3/uL (ref 0.7–4.0)
MCH: 31 pg (ref 26.0–34.0)
MCHC: 32.9 g/dL (ref 30.0–36.0)
MCV: 94.1 fL (ref 80.0–100.0)
Monocytes Absolute: 0.8 10*3/uL (ref 0.1–1.0)
Monocytes Relative: 7 %
Neutro Abs: 7.2 10*3/uL (ref 1.7–7.7)
Neutrophils Relative %: 65 %
Platelets: 241 10*3/uL (ref 150–400)
RBC: 4.58 MIL/uL (ref 4.22–5.81)
RDW: 12.7 % (ref 11.5–15.5)
WBC: 11.2 10*3/uL — ABNORMAL HIGH (ref 4.0–10.5)
nRBC: 0 % (ref 0.0–0.2)

## 2019-03-30 NOTE — Progress Notes (Signed)
Cardiology Office Note:    Date:  03/30/2019   ID:  Erik Allen, DOB 03/03/1955, MRN [id number]  PCP:  Ria Bush, MD  Cardiologist:  Kate Sable, MD  Electrophysiologist:  None   Referring MD: Ria Bush, MD   Chief Complaint  Patient presents with  . New Patient (Initial Visit)    Ref by Dr. Danise Mina for nonrheumatic aortic valve stenosis. Meds reviewed by the pt. verbally. Pt. c/o shortness of breath with occas. chest discomfort.    Erik Allen is a 64 y.o. male who is being seen today for the evaluation of aortic valve stenosis at the request of Ria Bush, MD.  History of Present Illness:    Erik Allen is a 64 y.o. male with a hx of aortic stenosis, hypertension, hyperlipidemia who presents due to history of aortic stenosis.  Patient has a history of aortic stenosis which has been followed for some time.  Last echocardiogram on 03/12/2018 showed severe aortic valve calcification, moderate to severe aortic stenosis, mean gradient 34, peak gradient 71, valve area by VTI 0.96 cm, LVEF 60 to 65%, mild LVH.  He states having occasional shortness of breath with exertion which he attributes to asthma.  He otherwise feels okay.  Past Medical History:  Diagnosis Date  . Aortic stenosis 06/21/2014   Echo 01/2018: Mild LVH, normal sys fxn EF 55-60%, G1DD, moderate AS with mildly dilated LA. rec yearly echocardiogram  . Asthma 09/13/99  . BENIGN PROSTATIC HYPERTROPHY 05/14/2003  . CAP (community acquired pneumonia) 01/31/2015  . Disorder of vocal cord 2012   h/o thrush on vocal cords per patient  . GERD (gastroesophageal reflux disease) 10/13/01   ENT eval for GERD and thrush 2011  . History of kidney stones   . HTN (hypertension)   . Hyperlipemia 10/13/97  . Seasonal allergic rhinitis    worse in spring  . Systolic murmur   . Transaminitis 2014   presumed fatty liver without [place], normal iron and viral hep panel    Past Surgical History:    Procedure Laterality Date  . A1 pulley release  10/2010   stensosing tenosynovitis, R milddle, ring, little fingers  . COLONOSCOPY  2010   WNL, rpt due 10 yrs  . CYSTECTOMY     from back  . CYSTOSCOPY  08/02   Stone retrieval  . EAR CYST EXCISION Left 08/02/2016   Procedure: EXCISION MUCOID CYST LEFT MIDDLE FINGER WITH DISTAL INTERPHALANGEAL JOINT [place];  Surgeon: Daryll Brod, MD;  Location: [place];  Service: Orthopedics;  Laterality: Left;  . FOOT CAPSULE RELEASE W/ PERCUTANEOUS HEEL CORD LENGTHENING, TIBIAL TENDON TRANSFER  04/09   bilateral, Dr. Milinda Pointer  . INGUINAL HERNIA REPAIR  06/28/09   left, with mesh, Dr. Bary Castilla  . ROTATION FLAP Left 08/02/2016   Procedure: ROTATION DORSAL FLAP;  Surgeon: Daryll Brod, MD;  Location: [place];  Service: Orthopedics;  Laterality: Left;  . US ECHOCARDIOGRAPHY  09/01   TR, MR    Current Medications: Current Meds  Medication Sig  . Accu-Chek FastClix Lancets MISC Use as directed to check blood sugar daily  . albuterol (PROVENTIL) (2.5 MG/3ML) 0.083% nebulizer solution USE 1 VIAL VIA NEBULIZER EVERY 6 HOURS AS NEEDED  . Blood Glucose Monitoring Suppl (ACCU-CHEK GUIDE) w/Device KIT 1 each by Does not apply route as directed.  . cholecalciferol (VITAMIN D) 1000 units tablet Take 2,000 Units by mouth daily.  . cyanocobalamin (V-R VITAMIN B-12) 500 MCG tablet Take  1 tablet (500 mcg total) by mouth daily.  . finasteride (PROSCAR) 5 MG tablet TAKE 1 TABLET BY MOUTH EVERY DAY  . Fluticasone-Salmeterol (ADVAIR DISKUS) 250-50 MCG/DOSE AEPB INHALE 1 PUFF BY MOUTH TWICE DAILY. RINSE MOUTH AFTER USE  . glucose blood (ACCU-CHEK GUIDE) test strip Use as instructed  . metFORMIN (GLUCOPHAGE) 500 MG tablet TAKE 1 TABLET (500 MG TOTAL) BY MOUTH 2 (TWO) TIMES DAILY WITH A MEAL.  . montelukast (SINGULAIR) 10 MG tablet TAKE 1 TABLET BY MOUTH EVERY NIGHT AT BEDTIME  . pantoprazole (PROTONIX) 40 MG tablet Take 40 mg by mouth at  bedtime.  . simvastatin (ZOCOR) 80 MG tablet TAKE 1 TABLET BY MOUTH EVERYDAY AT BEDTIME  . valsartan-hydrochlorothiazide (DIOVAN-HCT) 160-25 MG tablet Take 1 tablet by mouth daily.  . VENTOLIN HFA 108 (90 Base) MCG/ACT inhaler Inhale 2 puffs into the lungs every 6 (six) hours as needed for wheezing or shortness of breath.     Allergies:   Ace inhibitors, Codeine, and Tramadol   Social History   Socioeconomic History  . Marital status: Single    Spouse name: Not on file  . Number of children: Not on file  . Years of education: Not on file  . Highest education level: Not on file  Occupational History  . Occupation: TEAM LEADER    Employer: [employer]    Comment: Sheet metal fabrication  Tobacco Use  . Smoking status: Never Smoker  . Smokeless tobacco: Never Used  Substance and Sexual Activity  . Alcohol use: Yes    Alcohol/week: 1.0 - 2.0 standard drinks    Types: 1 - 2 Standard drinks or equivalent per week    Comment: occassionally  . Drug use: No  . Sexual activity: Yes  Other Topics Concern  . 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 Difficulty of Paying Living Expenses: Not on file  Food Insecurity:   . Worried About Charity fundraiser in the Last Year: Not on file  . Ran Out of Food in the Last Year: Not on file  Transportation Needs:   . Lack of Transportation (Medical): Not on file  . Lack of Transportation (Non-Medical): Not on file  Physical Activity:   . Days of Exercise per Week: Not on file  . Minutes of Exercise per Session: Not on file  Stress:   . Feeling of Stress : Not on file  Social Connections:   . Frequency of Communication with  Friends and Family: Not on file  . Frequency of Social Gatherings with Friends and Family: Not on file  . Attends Religious Services: Not on file  . Active Member of Clubs or Organizations: Not on file  . Attends Archivist Meetings: Not on file  . Marital Status: Not on file     Family History: The patient's family history includes Cancer (age of onset: 50) in his father; Hypertension in his brother, father, and mother.  ROS:   Please see the history of present illness.     All other systems reviewed and are negative.  EKGs/Labs/Other Studies Reviewed:    The following studies were reviewed today:   EKG:  EKG is  ordered today.  The ekg ordered today demonstrates normal sinus rhythm, normal ECG.  Recent Labs: 08/14/2018: ALT 43; BUN 19; Creatinine, Ser 0.95; Potassium 3.9; Sodium 140  Recent Lipid Panel    Component Value Date/Time   CHOL 158 08/14/2018 1152   TRIG 151.0 (H) 08/14/2018 1152   HDL 55.10 08/14/2018 1152   CHOLHDL 3 08/14/2018 1152   VLDL 30.2 08/14/2018 1152   LDLCALC 72 08/14/2018 1152   LDLDIRECT 94.0 07/09/2017 0739    Physical Exam:    VS:  BP (!) 142/92 (BP Location: Right Arm, Patient Position: Sitting, Cuff Size: Normal)   Pulse (!) 7   Ht _0  (1.778 m)   Wt 239 lb 8 oz (108.6 kg)   SpO2 98%   BMI 34.36 kg/m     Wt Readings from Last 3 Encounters:  03/30/19 239 lb 8 oz (108.6 kg)  12/05/18 236 lb 9 oz (107.3 kg)  08/18/18 234 lb 9 oz (106.4 kg)     GEN:  Well nourished, well developed in no acute distress HEENT: Normal NECK: No JVD; No carotid bruits LYMPHATICS: No lymphadenopathy CARDIAC: RRR, 3/6 systolic murmur, rusb, gallops RESPIRATORY:  Clear to auscultation without rales, wheezing or rhonchi  ABDOMEN: Soft, non-tender, non-distended MUSCULOSKELETAL:  No edema; No deformity  SKIN: Warm and dry NEUROLOGIC:  Alert and oriented x 3 PSYCHIATRIC:  Normal affect   ASSESSMENT:    1. Rheumatic aortic stenosis   2.  Essential hypertension   3. Pure hypercholesterolemia    PLAN:    In order of problems listed above:  1. Echocardiogram reviewed by myself.  Patient meets criteria for severe aortic valve stenosis.  DVI is 0.24, max velocity 4.2 m/s, mean gradient 41 mmHg, peak gradient 71 mmHg, aortic valve area of 0.9cm (echo date 03/12/2018  clip 64).normal EF, mild LVH.  We will schedule patient for right and left heart cath, and refer patient to see CT surgery for recommendations and input regarding valve replacement. 2. Blood pressure reasonably controlled.  Continue current BP meds 3. History of hyperlipidemia, continue statin for now.  Follow-up after left heart cath.   Medication Adjustments/Labs and Tests Ordered: Current medicines are reviewed at length with the patient today.  Concerns regarding medicines are outlined above.  Orders Placed This Encounter  Procedures  . Basic metabolic panel  . CBC w/Diff  . Ambulatory referral to Cardiothoracic Surgery  . EKG 12-Lead   No orders of the defined types were placed in this encounter.   Patient Instructions  Medication Instructions:  Your physician recommends that you continue on your current medications as directed. Please refer to the Current Medication list given to you today.  *If you need a refill on your cardiac medications before your next appointment, please call your pharmacy*  Lab Work: Bmet and Cbc today. Please have your labs drawn at the [facility] medical mall today.  You will need a COVID test prior to your procedure. Please report to the [place] drive up test site on 04/02/19 between 12:30-2:30pm.  If you have labs (blood work) drawn today and your tests are completely normal, you will receive your results only by: Marland Kitchen MyChart Message (if you have MyChart) OR . A paper copy in the mail If you have any lab test that is abnormal or we need to change your treatment, we will call you to review the  results.  Testing/Procedures: Your physician has requested that you have a cardiac catheterization. Cardiac  catheterization is used to diagnose and/or treat various heart conditions. Doctors may recommend this procedure for a number of different reasons. The most common reason is to evaluate chest pain. Chest pain can be a symptom of coronary artery disease (CAD), and cardiac catheterization can show whether plaque is narrowing or blocking your heart's arteries. This procedure is also used to evaluate the valves, as well as measure the blood flow and oxygen levels in different parts of your heart. For further information please visit HugeFiesta.[email or web address]. Please follow instruction sheet, as given.    Follow-Up: At [facility], you and your health needs are our priority.  As part of our continuing mission to provide you with exceptional heart care, we have created designated Provider Care Teams.  These Care Teams include your primary Cardiologist (physician) and Advanced Practice Providers (APPs -  Physician Assistants and Nurse Practitioners) who all work together to provide you with the care you need, when you need it.  Your next appointment:   4 week(s)     You have been referred to  [facility] [address] [phone] Their office will contact you to schedule the consult  The format for your next appointment:   In Person  Provider:    You may see Kate Sable, MD or one of the following Advanced Practice Providers on your designated Care Team:    Murray Hodgkins, NP  Christell Faith, PA-C  Marrianne Mood, PA-C   Other Instructions [facility] Cardiac Cath Instructions   You are scheduled for a Cardiac Cath on:__2/22/21 @ 9:30am with Dr. Arida_______________________  Please arrive at _8:30am______am on the day of your procedure  Please expect a call from our [place] to pre-register you  Do not eat/drink  anything after midnight  Someone will need to drive you home  It is recommended someone be with you for the first 24 hours after your procedure  Wear clothes that are easy to get on/off and wear slip on shoes if possible   Medications bring a current list of all medications with you   _X__ Do not take these medications before your procedure:__Metformin the morning of HOlD 48 hours after the   procedure._____________________________________________________HOLD your Valsartan HCTZ the morning of the procedure.________________________________________________________________________________________________________________________________________________________   Day of your procedure: Arrive at the [place] entrance.  Free valet service is available.  After entering the [place] please check-in at the registration desk (1st desk on your right) to receive your armband. After receiving your armband someone will escort you to the cardiac cath/special procedures waiting area.  The usual length of stay after your procedure is about 2 to 3 hours.  This can vary.  If you have any questions, please call our office at [phone], or you may call the cardiac cath lab at [facility] directly at [phone]      Signed, Kate Sable, MD  03/30/2019 4:35 PM    [place]

## 2019-03-30 NOTE — Patient Instructions (Signed)
Medication Instructions:  Your physician recommends that you continue on your current medications as directed. Please refer to the Current Medication list given to you today.  *If you need a refill on your cardiac medications before your next appointment, please call your pharmacy*  Lab Work: Bmet and Cbc today. Please have your labs drawn at the [facility] medical mall today.  You will need a COVID test prior to your procedure. Please report to the [place] drive up test site on 04/02/19 between 12:30-2:30pm.  If you have labs (blood work) drawn today and your tests are completely normal, you will receive your results only by: Marland Kitchen MyChart Message (if you have MyChart) OR . A paper copy in the mail If you have any lab test that is abnormal or we need to change your treatment, we will call you to review the results.  Testing/Procedures: Your physician has requested that you have a cardiac catheterization. Cardiac catheterization is used to diagnose and/or treat various heart conditions. Doctors may recommend this procedure for a number of different reasons. The most common reason is to evaluate chest pain. Chest pain can be a symptom of coronary artery disease (CAD), and cardiac catheterization can show whether plaque is narrowing or blocking your heart's arteries. This procedure is also used to evaluate the valves, as well as measure the blood flow and oxygen levels in different parts of your heart. For further information please visit HugeFiesta.tn. Please follow instruction sheet, as given.    Follow-Up: At [facility], you and your health needs are our priority.  As part of our continuing mission to provide you with exceptional heart care, we have created designated Provider Care Teams.  These Care Teams include your primary Cardiologist (physician) and Advanced Practice Providers (APPs -  Physician Assistants and Nurse Practitioners) who all work together to provide you with the  care you need, when you need it.  Your next appointment:   4 week(s)     You have been referred to  [facility] [address] [phone] Their office will contact you to schedule the consult  The format for your next appointment:   In Person  Provider:    You may see Kate Sable, MD or one of the following Advanced Practice Providers on your designated Care Team:    Murray Hodgkins, NP  Christell Faith, PA-C  Marrianne Mood, PA-C   Other Instructions [facility] Cardiac Cath Instructions   You are scheduled for a Cardiac Cath on:__2/22/21 @ 9:30am with Dr. Arida_______________________  Please arrive at _8:30am______am on the day of your procedure  Please expect a call from our [place] to pre-register you  Do not eat/drink anything after midnight  Someone will need to drive you home  It is recommended someone be with you for the first 24 hours after your procedure  Wear clothes that are easy to get on/off and wear slip on shoes if possible   Medications bring a current list of all medications with you   _X__ Do not take these medications before your procedure:__Metformin the morning of HOlD 48 hours after the   procedure._____________________________________________________HOLD your Valsartan HCTZ the morning of the procedure.________________________________________________________________________________________________________________________________________________________   Day of your procedure: Arrive at the [place] entrance.  Free valet service is available.  After entering the [place] please check-in at the registration desk (1st desk on your right) to receive your armband. After receiving your armband someone will escort you to  the cardiac cath/special procedures waiting area.  The usual length of stay after your procedure is about 2 to 3 hours.  This can vary.  If you have any  questions, please call our office at [phone], or you may call the cardiac cath lab at [facility] directly at [phone]

## 2019-03-30 NOTE — H&P (View-Only) (Signed)
Cardiology Office Note:    Date:  03/30/2019   ID:  Erik Allen, DOB 03/03/1955, MRN [id number]  PCP:  Ria Bush, MD  Cardiologist:  Kate Sable, MD  Electrophysiologist:  None   Referring MD: Ria Bush, MD   Chief Complaint  Patient presents with  . New Patient (Initial Visit)    Ref by Dr. Danise Mina for nonrheumatic aortic valve stenosis. Meds reviewed by the pt. verbally. Pt. c/o shortness of breath with occas. chest discomfort.    Erik Allen is a 64 y.o. male who is being seen today for the evaluation of aortic valve stenosis at the request of Ria Bush, MD.  History of Present Illness:    Erik Allen is a 64 y.o. male with a hx of aortic stenosis, hypertension, hyperlipidemia who presents due to history of aortic stenosis.  Patient has a history of aortic stenosis which has been followed for some time.  Last echocardiogram on 03/12/2018 showed severe aortic valve calcification, moderate to severe aortic stenosis, mean gradient 34, peak gradient 71, valve area by VTI 0.96 cm, LVEF 60 to 65%, mild LVH.  He states having occasional shortness of breath with exertion which he attributes to asthma.  He otherwise feels okay.  Past Medical History:  Diagnosis Date  . Aortic stenosis 06/21/2014   Echo 01/2018: Mild LVH, normal sys fxn EF 55-60%, G1DD, moderate AS with mildly dilated LA. rec yearly echocardiogram  . Asthma 09/13/99  . BENIGN PROSTATIC HYPERTROPHY 05/14/2003  . CAP (community acquired pneumonia) 01/31/2015  . Disorder of vocal cord 2012   h/o thrush on vocal cords per patient  . GERD (gastroesophageal reflux disease) 10/13/01   ENT eval for GERD and thrush 2011  . History of kidney stones   . HTN (hypertension)   . Hyperlipemia 10/13/97  . Seasonal allergic rhinitis    worse in spring  . Systolic murmur   . Transaminitis 2014   presumed fatty liver without [place], normal iron and viral hep panel    Past Surgical History:    Procedure Laterality Date  . A1 pulley release  10/2010   stensosing tenosynovitis, R milddle, ring, little fingers  . COLONOSCOPY  2010   WNL, rpt due 10 yrs  . CYSTECTOMY     from back  . CYSTOSCOPY  08/02   Stone retrieval  . EAR CYST EXCISION Left 08/02/2016   Procedure: EXCISION MUCOID CYST LEFT MIDDLE FINGER WITH DISTAL INTERPHALANGEAL JOINT [place];  Surgeon: Daryll Brod, MD;  Location: [place];  Service: Orthopedics;  Laterality: Left;  . FOOT CAPSULE RELEASE W/ PERCUTANEOUS HEEL CORD LENGTHENING, TIBIAL TENDON TRANSFER  04/09   bilateral, Dr. Milinda Pointer  . INGUINAL HERNIA REPAIR  06/28/09   left, with mesh, Dr. Bary Castilla  . ROTATION FLAP Left 08/02/2016   Procedure: ROTATION DORSAL FLAP;  Surgeon: Daryll Brod, MD;  Location: [place];  Service: Orthopedics;  Laterality: Left;  . US ECHOCARDIOGRAPHY  09/01   TR, MR    Current Medications: Current Meds  Medication Sig  . Accu-Chek FastClix Lancets MISC Use as directed to check blood sugar daily  . albuterol (PROVENTIL) (2.5 MG/3ML) 0.083% nebulizer solution USE 1 VIAL VIA NEBULIZER EVERY 6 HOURS AS NEEDED  . Blood Glucose Monitoring Suppl (ACCU-CHEK GUIDE) w/Device KIT 1 each by Does not apply route as directed.  . cholecalciferol (VITAMIN D) 1000 units tablet Take 2,000 Units by mouth daily.  . cyanocobalamin (V-R VITAMIN B-12) 500 MCG tablet Take  1 tablet (500 mcg total) by mouth daily.  . finasteride (PROSCAR) 5 MG tablet TAKE 1 TABLET BY MOUTH EVERY DAY  . Fluticasone-Salmeterol (ADVAIR DISKUS) 250-50 MCG/DOSE AEPB INHALE 1 PUFF BY MOUTH TWICE DAILY. RINSE MOUTH AFTER USE  . glucose blood (ACCU-CHEK GUIDE) test strip Use as instructed  . metFORMIN (GLUCOPHAGE) 500 MG tablet TAKE 1 TABLET (500 MG TOTAL) BY MOUTH 2 (TWO) TIMES DAILY WITH A MEAL.  . montelukast (SINGULAIR) 10 MG tablet TAKE 1 TABLET BY MOUTH EVERY NIGHT AT BEDTIME  . pantoprazole (PROTONIX) 40 MG tablet Take 40 mg by mouth at  bedtime.  . simvastatin (ZOCOR) 80 MG tablet TAKE 1 TABLET BY MOUTH EVERYDAY AT BEDTIME  . valsartan-hydrochlorothiazide (DIOVAN-HCT) 160-25 MG tablet Take 1 tablet by mouth daily.  . VENTOLIN HFA 108 (90 Base) MCG/ACT inhaler Inhale 2 puffs into the lungs every 6 (six) hours as needed for wheezing or shortness of breath.     Allergies:   Ace inhibitors, Codeine, and Tramadol   Social History   Socioeconomic History  . Marital status: Single    Spouse name: Not on file  . Number of children: Not on file  . Years of education: Not on file  . Highest education level: Not on file  Occupational History  . Occupation: TEAM LEADER    Employer: [employer]    Comment: Sheet metal fabrication  Tobacco Use  . Smoking status: Never Smoker  . Smokeless tobacco: Never Used  Substance and Sexual Activity  . Alcohol use: Yes    Alcohol/week: 1.0 - 2.0 standard drinks    Types: 1 - 2 Standard drinks or equivalent per week    Comment: occassionally  . Drug use: No  . Sexual activity: Yes  Other Topics Concern  . 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 Difficulty of Paying Living Expenses: Not on file  Food Insecurity:   . Worried About Charity fundraiser in the Last Year: Not on file  . Ran Out of Food in the Last Year: Not on file  Transportation Needs:   . Lack of Transportation (Medical): Not on file  . Lack of Transportation (Non-Medical): Not on file  Physical Activity:   . Days of Exercise per Week: Not on file  . Minutes of Exercise per Session: Not on file  Stress:   . Feeling of Stress : Not on file  Social Connections:   . Frequency of Communication with  Friends and Family: Not on file  . Frequency of Social Gatherings with Friends and Family: Not on file  . Attends Religious Services: Not on file  . Active Member of Clubs or Organizations: Not on file  . Attends Archivist Meetings: Not on file  . Marital Status: Not on file     Family History: The patient's family history includes Cancer (age of onset: 50) in his father; Hypertension in his brother, father, and mother.  ROS:   Please see the history of present illness.     All other systems reviewed and are negative.  EKGs/Labs/Other Studies Reviewed:    The following studies were reviewed today:   EKG:  EKG is  ordered today.  The ekg ordered today demonstrates normal sinus rhythm, normal ECG.  Recent Labs: 08/14/2018: ALT 43; BUN 19; Creatinine, Ser 0.95; Potassium 3.9; Sodium 140  Recent Lipid Panel    Component Value Date/Time   CHOL 158 08/14/2018 1152   TRIG 151.0 (H) 08/14/2018 1152   HDL 55.10 08/14/2018 1152   CHOLHDL 3 08/14/2018 1152   VLDL 30.2 08/14/2018 1152   LDLCALC 72 08/14/2018 1152   LDLDIRECT 94.0 07/09/2017 0739    Physical Exam:    VS:  BP (!) 142/92 (BP Location: Right Arm, Patient Position: Sitting, Cuff Size: Normal)   Pulse (!) 7   Ht _0  (1.778 m)   Wt 239 lb 8 oz (108.6 kg)   SpO2 98%   BMI 34.36 kg/m     Wt Readings from Last 3 Encounters:  03/30/19 239 lb 8 oz (108.6 kg)  12/05/18 236 lb 9 oz (107.3 kg)  08/18/18 234 lb 9 oz (106.4 kg)     GEN:  Well nourished, well developed in no acute distress HEENT: Normal NECK: No JVD; No carotid bruits LYMPHATICS: No lymphadenopathy CARDIAC: RRR, 3/6 systolic murmur, rusb, gallops RESPIRATORY:  Clear to auscultation without rales, wheezing or rhonchi  ABDOMEN: Soft, non-tender, non-distended MUSCULOSKELETAL:  No edema; No deformity  SKIN: Warm and dry NEUROLOGIC:  Alert and oriented x 3 PSYCHIATRIC:  Normal affect   ASSESSMENT:    1. Rheumatic aortic stenosis   2.  Essential hypertension   3. Pure hypercholesterolemia    PLAN:    In order of problems listed above:  1. Echocardiogram reviewed by myself.  Patient meets criteria for severe aortic valve stenosis.  DVI is 0.24, max velocity 4.2 m/s, mean gradient 41 mmHg, peak gradient 71 mmHg, aortic valve area of 0.9cm (echo date 03/12/2018  clip 64).normal EF, mild LVH.  We will schedule patient for right and left heart cath, and refer patient to see CT surgery for recommendations and input regarding valve replacement. 2. Blood pressure reasonably controlled.  Continue current BP meds 3. History of hyperlipidemia, continue statin for now.  Follow-up after left heart cath.   Medication Adjustments/Labs and Tests Ordered: Current medicines are reviewed at length with the patient today.  Concerns regarding medicines are outlined above.  Orders Placed This Encounter  Procedures  . Basic metabolic panel  . CBC w/Diff  . Ambulatory referral to Cardiothoracic Surgery  . EKG 12-Lead   No orders of the defined types were placed in this encounter.   Patient Instructions  Medication Instructions:  Your physician recommends that you continue on your current medications as directed. Please refer to the Current Medication list given to you today.  *If you need a refill on your cardiac medications before your next appointment, please call your pharmacy*  Lab Work: Bmet and Cbc today. Please have your labs drawn at the [facility] medical mall today.  You will need a COVID test prior to your procedure. Please report to the [place] drive up test site on 04/02/19 between 12:30-2:30pm.  If you have labs (blood work) drawn today and your tests are completely normal, you will receive your results only by: Marland Kitchen MyChart Message (if you have MyChart) OR . A paper copy in the mail If you have any lab test that is abnormal or we need to change your treatment, we will call you to review the  results.  Testing/Procedures: Your physician has requested that you have a cardiac catheterization. Cardiac  catheterization is used to diagnose and/or treat various heart conditions. Doctors may recommend this procedure for a number of different reasons. The most common reason is to evaluate chest pain. Chest pain can be a symptom of coronary artery disease (CAD), and cardiac catheterization can show whether plaque is narrowing or blocking your heart's arteries. This procedure is also used to evaluate the valves, as well as measure the blood flow and oxygen levels in different parts of your heart. For further information please visit [email or web address]. Please follow instruction sheet, as given.    Follow-Up: At [facility], you and your health needs are our priority.  As part of our continuing mission to provide you with exceptional heart care, we have created designated Provider Care Teams.  These Care Teams include your primary Cardiologist (physician) and Advanced Practice Providers (APPs -  Physician Assistants and Nurse Practitioners) who all work together to provide you with the care you need, when you need it.  Your next appointment:   4 week(s)     You have been referred to  [facility] [address] [phone] Their office will contact you to schedule the consult  The format for your next appointment:   In Person  Provider:    You may see Kate Sable, MD or one of the following Advanced Practice Providers on your designated Care Team:    Murray Hodgkins, NP  Christell Faith, PA-C  Marrianne Mood, PA-C   Other Instructions [facility] Cardiac Cath Instructions   You are scheduled for a Cardiac Cath on:__2/22/21 @ 9:30am with Dr. Arida_______________________  Please arrive at _8:30am______am on the day of your procedure  Please expect a call from our [place] to pre-register you  Do not eat/drink  anything after midnight  Someone will need to drive you home  It is recommended someone be with you for the first 24 hours after your procedure  Wear clothes that are easy to get on/off and wear slip on shoes if possible   Medications bring a current list of all medications with you   _X__ Do not take these medications before your procedure:__Metformin the morning of HOlD 48 hours after the   procedure._____________________________________________________HOLD your Valsartan HCTZ the morning of the procedure.________________________________________________________________________________________________________________________________________________________   Day of your procedure: Arrive at the [place] entrance.  Free valet service is available.  After entering the [place] please check-in at the registration desk (1st desk on your right) to receive your armband. After receiving your armband someone will escort you to the cardiac cath/special procedures waiting area.  The usual length of stay after your procedure is about 2 to 3 hours.  This can vary.  If you have any questions, please call our office at [phone], or you may call the cardiac cath lab at [facility] directly at [phone]      Signed, Kate Sable, MD  03/30/2019 4:35 PM    [place]

## 2019-04-02 ENCOUNTER — Other Ambulatory Visit
Admission: RE | Admit: 2019-04-02 | Discharge: 2019-04-02 | Disposition: A | Discharge status: Home / Self Care | Payer: BC Managed Care – PPO | Source: Ambulatory Visit | Attending: Cardiology | Admitting: Cardiology

## 2019-04-02 DIAGNOSIS — Z01812 Encounter for preprocedural laboratory examination: Secondary | ICD-10-CM | POA: Insufficient documentation

## 2019-04-02 DIAGNOSIS — Z20822 Contact with and (suspected) exposure to covid-19: Secondary | ICD-10-CM | POA: Diagnosis not present

## 2019-04-02 LAB — SARS CORONAVIRUS 2 (TAT 6-24 HRS): SARS Coronavirus 2: NEGATIVE

## 2019-04-03 ENCOUNTER — Other Ambulatory Visit: Payer: Self-pay | Admitting: Family Medicine

## 2019-04-03 ENCOUNTER — Other Ambulatory Visit: Payer: Self-pay | Admitting: Cardiology

## 2019-04-03 DIAGNOSIS — I350 Nonrheumatic aortic (valve) stenosis: Secondary | ICD-10-CM

## 2019-04-03 DIAGNOSIS — E119 Type 2 diabetes mellitus without complications: Secondary | ICD-10-CM

## 2019-04-06 ENCOUNTER — Encounter: Payer: Self-pay | Admitting: Cardiovascular Disease

## 2019-04-06 ENCOUNTER — Encounter
Admission: RE | Disposition: A | Discharge status: Home / Self Care | Payer: Self-pay | Source: Home / Self Care | Attending: Cardiovascular Disease

## 2019-04-06 ENCOUNTER — Other Ambulatory Visit: Payer: Self-pay

## 2019-04-06 ENCOUNTER — Ambulatory Visit
Admission: RE | Admit: 2019-04-06 | Discharge: 2019-04-06 | Disposition: A | Discharge status: Home / Self Care | Payer: BC Managed Care – PPO | Attending: Cardiovascular Disease | Admitting: Cardiovascular Disease

## 2019-04-06 DIAGNOSIS — Z79899 Other long term (current) drug therapy: Secondary | ICD-10-CM | POA: Insufficient documentation

## 2019-04-06 DIAGNOSIS — Z885 Allergy status to narcotic agent status: Secondary | ICD-10-CM | POA: Diagnosis not present

## 2019-04-06 DIAGNOSIS — E785 Hyperlipidemia, unspecified: Secondary | ICD-10-CM | POA: Insufficient documentation

## 2019-04-06 DIAGNOSIS — Z888 Allergy status to other drugs, medicaments and biological substances status: Secondary | ICD-10-CM | POA: Diagnosis not present

## 2019-04-06 DIAGNOSIS — K219 Gastro-esophageal reflux disease without esophagitis: Secondary | ICD-10-CM | POA: Diagnosis not present

## 2019-04-06 DIAGNOSIS — I060 Rheumatic aortic stenosis: Secondary | ICD-10-CM | POA: Diagnosis not present

## 2019-04-06 DIAGNOSIS — I350 Nonrheumatic aortic (valve) stenosis: Secondary | ICD-10-CM

## 2019-04-06 DIAGNOSIS — J45909 Unspecified asthma, uncomplicated: Secondary | ICD-10-CM | POA: Insufficient documentation

## 2019-04-06 DIAGNOSIS — Z7951 Long term (current) use of inhaled steroids: Secondary | ICD-10-CM | POA: Insufficient documentation

## 2019-04-06 DIAGNOSIS — E7800 Pure hypercholesterolemia, unspecified: Secondary | ICD-10-CM | POA: Diagnosis not present

## 2019-04-06 DIAGNOSIS — N400 Enlarged prostate without lower urinary tract symptoms: Secondary | ICD-10-CM | POA: Diagnosis not present

## 2019-04-06 DIAGNOSIS — I10 Essential (primary) hypertension: Secondary | ICD-10-CM | POA: Insufficient documentation

## 2019-04-06 DIAGNOSIS — Z7984 Long term (current) use of oral hypoglycemic drugs: Secondary | ICD-10-CM | POA: Insufficient documentation

## 2019-04-06 HISTORY — PX: RIGHT/LEFT HEART CATH AND CORONARY ANGIOGRAPHY: CATH118266

## 2019-04-06 LAB — GLUCOSE, CAPILLARY
Glucose-Capillary: 106 mg/dL — ABNORMAL HIGH (ref 70–99)
Glucose-Capillary: 124 mg/dL — ABNORMAL HIGH (ref 70–99)

## 2019-04-06 SURGERY — RIGHT/LEFT HEART CATH AND CORONARY ANGIOGRAPHY
Anesthesia: Moderate Sedation | Laterality: Bilateral

## 2019-04-06 MED ORDER — ONDANSETRON HCL 4 MG/2ML IJ SOLN: 4.0000 mg | Freq: Four times a day (QID) | INTRAMUSCULAR | Status: DC | PRN

## 2019-04-06 MED ORDER — HEPARIN (PORCINE) IN NACL 1000-0.9 UT/500ML-% IV SOLN
INTRAVENOUS | Status: DC | PRN
  Administered 2019-04-06: 500 mL

## 2019-04-06 MED ORDER — MIDAZOLAM HCL 2 MG/2ML IJ SOLN
INTRAMUSCULAR | Status: AC
  Filled 2019-04-06: qty 2

## 2019-04-06 MED ORDER — SODIUM CHLORIDE 0.9 % IV SOLN: INTRAVENOUS | Status: DC

## 2019-04-06 MED ORDER — VERAPAMIL HCL 2.5 MG/ML IV SOLN
INTRAVENOUS | Status: DC | PRN
  Administered 2019-04-06: 2.5 mg via INTRA_ARTERIAL

## 2019-04-06 MED ORDER — SODIUM CHLORIDE 0.9 % IV SOLN: 250.0000 mL | INTRAVENOUS | Status: DC | PRN

## 2019-04-06 MED ORDER — ASPIRIN 81 MG PO CHEW: 81.0000 mg | CHEWABLE_TABLET | ORAL | Status: AC

## 2019-04-06 MED ORDER — SODIUM CHLORIDE 0.9 % WEIGHT BASED INFUSION: 1.0000 mL/kg/h | INTRAVENOUS | Status: DC

## 2019-04-06 MED ORDER — HEPARIN (PORCINE) IN NACL 1000-0.9 UT/500ML-% IV SOLN
INTRAVENOUS | Status: AC
  Filled 2019-04-06: qty 1000

## 2019-04-06 MED ORDER — VERAPAMIL HCL 2.5 MG/ML IV SOLN
INTRAVENOUS | Status: AC
  Filled 2019-04-06: qty 2

## 2019-04-06 MED ORDER — IOHEXOL 300 MG/ML  SOLN
INTRAMUSCULAR | Status: DC | PRN
  Administered 2019-04-06: 70 mL

## 2019-04-06 MED ORDER — ASPIRIN 81 MG PO CHEW
CHEWABLE_TABLET | ORAL | Status: AC
  Administered 2019-04-06: 81 mg via ORAL
  Filled 2019-04-06: qty 1

## 2019-04-06 MED ORDER — HEPARIN SODIUM (PORCINE) 1000 UNIT/ML IJ SOLN
INTRAMUSCULAR | Status: AC
  Filled 2019-04-06: qty 1

## 2019-04-06 MED ORDER — SODIUM CHLORIDE 0.9 % WEIGHT BASED INFUSION
3.0000 mL/kg/h | INTRAVENOUS | Status: AC
  Administered 2019-04-06: 3 mL/kg/h via INTRAVENOUS

## 2019-04-06 MED ORDER — SODIUM CHLORIDE 0.9% FLUSH: 3.0000 mL | INTRAVENOUS | Status: DC | PRN

## 2019-04-06 MED ORDER — SODIUM CHLORIDE 0.9% FLUSH: 3.0000 mL | Freq: Two times a day (BID) | INTRAVENOUS | Status: DC

## 2019-04-06 MED ORDER — FENTANYL CITRATE (PF) 100 MCG/2ML IJ SOLN
INTRAMUSCULAR | Status: AC
  Filled 2019-04-06: qty 2

## 2019-04-06 MED ORDER — HEPARIN SODIUM (PORCINE) 1000 UNIT/ML IJ SOLN
INTRAMUSCULAR | Status: DC | PRN
  Administered 2019-04-06: 5000 [IU] via INTRAVENOUS

## 2019-04-06 MED ORDER — MIDAZOLAM HCL 2 MG/2ML IJ SOLN
INTRAMUSCULAR | Status: DC | PRN
  Administered 2019-04-06: 1 mg via INTRAVENOUS

## 2019-04-06 MED ORDER — FENTANYL CITRATE (PF) 100 MCG/2ML IJ SOLN
INTRAMUSCULAR | Status: DC | PRN
  Administered 2019-04-06: 50 ug via INTRAVENOUS

## 2019-04-06 MED ORDER — ACETAMINOPHEN 325 MG PO TABS: 650.0000 mg | ORAL_TABLET | ORAL | Status: DC | PRN

## 2019-04-06 SURGICAL SUPPLY — 15 items
CATH BALLN WEDGE 5F 110CM (CATHETERS) ×1 IMPLANT
CATH INFINITI 5 FR JL3.5 (CATHETERS) ×1 IMPLANT
CATH INFINITI 5FR ANG PIGTAIL (CATHETERS) ×1 IMPLANT
CATH INFINITI 5FR JK (CATHETERS) ×1 IMPLANT
CATH INFINITI JR4 5F (CATHETERS) ×1 IMPLANT
DEVICE RAD TR BAND REGULAR (VASCULAR PRODUCTS) ×1 IMPLANT
GLIDESHEATH SLEND SS 6F .021 (SHEATH) ×1 IMPLANT
KIT MANI 3VAL PERCEP (MISCELLANEOUS) ×2 IMPLANT
KIT RIGHT HEART (MISCELLANEOUS) ×2 IMPLANT
PACK CARDIAC CATH (CUSTOM PROCEDURE TRAY) ×2 IMPLANT
SHEATH GLIDE SLENDER 4/5FR (SHEATH) ×1 IMPLANT
TUBING CIL FLEX 10 FLL-RA (TUBING) ×1 IMPLANT
WIRE EMERALD ST .035X150CM (WIRE) ×1 IMPLANT
WIRE HITORQ VERSACORE ST 145CM (WIRE) ×1 IMPLANT
WIRE ROSEN-J .035X260CM (WIRE) ×1 IMPLANT

## 2019-04-06 NOTE — Discharge Instructions (Signed)
Radial Site Care Refer to this sheet in the next few weeks. These instructions provide you with information about caring for yourself after your procedure. Your health care provider may also give you more specific instructions. Your treatment has been planned according to current medical practices, but problems sometimes occur. Call your health care provider if you have any problems or questions after your procedure. What can I expect after the procedure? After your procedure, it is typical to have the following: Bruising at the radial site that usually fades within 1-2 weeks. Blood collecting in the tissue (hematoma) that may be painful to the touch. It should usually decrease in size and tenderness within 1-2 weeks.  Follow these instructions at home: Take medicines only as directed by your health care provider. If you are on a medication called Metformin please do not take for 48 hours after your procedure. Over the next 48hrs please increase your fluid intake of water and non caffeine beverages to flush the contrast dye out of your system.  You may shower 24 hours after the procedure  Leave your bandage on and gently wash the site with plain soap and water. Pat the area dry with a clean towel. Do not rub the site, because this may cause bleeding.  Remove your dressing 48hrs after your procedure and leave open to air.  Do not submerge your site in water for 7 days. This includes swimming and washing dishes.  Check your insertion site every day for redness, swelling, or drainage. Do not apply powder or lotion to the site. Do not flex or bend the affected arm for 24 hours or as directed by your health care provider. Do not push or pull heavy objects with the affected arm for 24 hours or as directed by your health care provider. Do not lift over 10 lb (4.5 kg) for 5 days after your procedure or as directed by your health care provider. Ask your health care provider when it is okay to: Return to  work or school. Resume usual physical activities or sports. Resume sexual activity. Do not drive home if you are discharged the same day as the procedure. Have someone else drive you. You may drive 48 hours after the procedure Do not operate machinery or power tools for 24 hours after the procedure. If your procedure was done as an outpatient procedure, which means that you went home the same day as your procedure, a responsible adult should be with you for the first 24 hours after you arrive home. Keep all follow-up visits as directed by your health care provider. This is important. Contact a health care provider if: You have a fever. You have chills. You have increased bleeding from the radial site. Hold pressure on the site. Get help right away if: You have unusual pain at the radial site. You have redness, warmth, or swelling at the radial site. You have drainage (other than a small amount of blood on the dressing) from the radial site. The radial site is bleeding, and the bleeding does not stop after 15 minutes of holding steady pressure on the site. Your arm or hand becomes pale, cool, tingly, or numb. This information is not intended to replace advice given to you by your health care provider. Make sure you discuss any questions you have with your health care provider. Document Released: 03/03/2010 Document Revised: 07/07/2015 Document Reviewed: 08/17/2013 Elsevier Interactive Patient Education  2018 Elsevier Inc.  

## 2019-04-06 NOTE — Interval H&P Note (Signed)
History and Physical Interval Note:  04/06/2019 9:46 AM  Erik Allen  has presented today for surgery, with the diagnosis of RT and LT Cath   Aortic stenosis.  The various methods of treatment have been discussed with the patient and family. After consideration of risks, benefits and other options for treatment, the patient has consented to  Procedure(s): RIGHT/LEFT HEART CATH AND CORONARY ANGIOGRAPHY (Bilateral) as a surgical intervention.  The patient's history has been reviewed, patient examined, no change in status, stable for surgery.  I have reviewed the patient's chart and labs.  Questions were answered to the patient's satisfaction.     [name]

## 2019-04-10 ENCOUNTER — Other Ambulatory Visit: Payer: Self-pay

## 2019-04-10 ENCOUNTER — Institutional Professional Consult (permissible substitution) (INDEPENDENT_AMBULATORY_CARE_PROVIDER_SITE_OTHER): Payer: BC Managed Care – PPO | Admitting: Thoracic Surgery (Cardiothoracic Vascular Surgery)

## 2019-04-10 ENCOUNTER — Other Ambulatory Visit: Payer: Self-pay | Admitting: Thoracic Surgery (Cardiothoracic Vascular Surgery)

## 2019-04-10 ENCOUNTER — Other Ambulatory Visit: Payer: Self-pay | Admitting: *Deleted

## 2019-04-10 ENCOUNTER — Encounter: Payer: Self-pay | Admitting: Thoracic Surgery (Cardiothoracic Vascular Surgery)

## 2019-04-10 ENCOUNTER — Encounter: Payer: Self-pay | Admitting: *Deleted

## 2019-04-10 VITALS — BP 165/95 | HR 114 | Temp 97.7°F | Resp 24 | Ht 70.0 in | Wt 238.0 lb

## 2019-04-10 DIAGNOSIS — I350 Nonrheumatic aortic (valve) stenosis: Secondary | ICD-10-CM | POA: Diagnosis not present

## 2019-04-10 NOTE — Progress Notes (Signed)
[address]       [place] [address]             Erik Allen REPORT  Referring Provider is Kate Sable, MD PCP is Ria Bush, MD  Chief Complaint  Patient presents with  . Aortic Stenosis    Surgical consultation    HPI:  Patient is a 64 year old obese white male with history of aortic stenosis, hypertension, hyperlipidemia, insulin-dependent type 2 diabetes mellitus, asthma, and GE reflux disease who has been referred for surgical consultation to discuss treatment options for management of severe symptomatic aortic stenosis.  Patient has reported history of scarlet fever during childhood and has been told that he has a heart murmur all of his life.  More recently he has been diagnosed with aortic stenosis for which she has been followed with serial echocardiograms.  Echocardiogram performed in 2019 revealed normal left ventricular systolic function with what was reported to be moderate aortic stenosis.  Peak velocity across aortic valve was measured 4.1 m/s corresponding to mean transvalvular gradient estimated 31 mmHg and aortic valve area calculated 1.1 cm with DVI measured 0.5 at that time.  He was seen in follow-up recently by his primary care physician and reported symptoms of exertional shortness of breath.  He was referred for cardiology consultation and has been evaluated previously by Dr. Garen Lah.  Follow-up echocardiogram performed March 13, 2019 revealed severe aortic stenosis.  Peak velocity across aortic valve measured 4.2 m/s corresponding to mean transvalvular gradient estimated 35 mmHg and aortic valve area calculated 0.95 cm with DVI measured 0.28.  Diagnostic cardiac catheterization was performed April 06, 2019 and revealed normal coronary artery anatomy with no significant coronary artery disease.  Right heart pressures were mildly elevated.  Attempts to cross the aortic valve were  unsuccessful due to severe calcification.  Cardiothoracic surgical consultation was requested.  Patient is single and lives alone in [place].  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  The patient has been obese much of his adult life and does not exercise at all on a regular basis.  He states that he used to walk regularly but he had to give that up 2 or 3 years ago because of the development of worsening fatigue and exertional shortness of breath.  He states that over the past couple of years he has noticed significant increased fatigue with decreased energy and exertional shortness of breath and chest tightness.  He denies any resting shortness of breath, PND, orthopnea, or lower extremity edema.  He has had occasional slight dizzy spells without any frank syncopal episodes.  He has a cough that is occasionally productive and made worse by laying flat in bed at night.  He also describes exertional shortness of breath which he attributes to asthma.  Past Medical History:  Diagnosis Date  . Aortic stenosis 06/21/2014   Echo 01/2018: Mild LVH, normal sys fxn EF 55-60%, G1DD, moderate AS with mildly dilated LA. rec yearly echocardiogram  . Asthma 09/13/99  . BENIGN PROSTATIC HYPERTROPHY 05/14/2003  . CAP (community acquired pneumonia) 01/31/2015  . Disorder of vocal cord 2012   h/o thrush on vocal cords per patient  . GERD (gastroesophageal reflux disease) 10/13/01   ENT eval for GERD and thrush 2011  . History of kidney stones   . HTN (hypertension)   . Hyperlipemia 10/13/97  . Seasonal allergic  rhinitis    worse in spring  . Systolic murmur   . Transaminitis 2014   presumed fatty liver without [place], normal iron and viral hep panel    Past Surgical History:  Procedure Laterality Date  . A1 pulley release  10/2010   stensosing tenosynovitis, R milddle, ring, little fingers  . COLONOSCOPY  2010   WNL, rpt due 10 yrs  . CYSTECTOMY      from back  . CYSTOSCOPY  08/02   Stone retrieval  . EAR CYST EXCISION Left 08/02/2016   Procedure: EXCISION MUCOID CYST LEFT MIDDLE FINGER WITH DISTAL INTERPHALANGEAL JOINT Butler;  Surgeon: Daryll Brod, MD;  Location: [facility];  Service: Orthopedics;  Laterality: Left;  . FOOT CAPSULE RELEASE W/ PERCUTANEOUS HEEL CORD LENGTHENING, TIBIAL TENDON TRANSFER  04/09   bilateral, Dr. Milinda Pointer  . INGUINAL HERNIA REPAIR  06/28/09   left, with mesh, Dr. Bary Castilla  . RIGHT/LEFT HEART CATH AND CORONARY ANGIOGRAPHY Bilateral 04/06/2019   Procedure: RIGHT/LEFT HEART CATH AND CORONARY ANGIOGRAPHY;  Surgeon: Wellington Hampshire, MD;  Location: [facility];  Service: Cardiovascular;  Laterality: Bilateral;  . ROTATION FLAP Left 08/02/2016   Procedure: ROTATION DORSAL FLAP;  Surgeon: Daryll Brod, MD;  Location: [facility];  Service: Orthopedics;  Laterality: Left;  . US ECHOCARDIOGRAPHY  09/01   TR, MR    Family History  Problem Relation Age of Onset  . Hypertension Mother   . Cancer Father 71       Lymphoma, recurrent  . Hypertension Father   . Hypertension Brother     Social History   Socioeconomic History  . Marital status: Single    Spouse name: Not on file  . Number of children: Not on file  . Years of education: Not on file  . Highest education level: Not on file  Occupational History  . Occupation: TEAM LEADER    Employer: [employer]    Comment: Sheet metal fabrication  Tobacco Use  . Smoking status: Never Smoker  . Smokeless tobacco: Never Used  Substance and Sexual Activity  . Alcohol use: Yes    Alcohol/week: 1.0 - 2.0 standard drinks    Types: 1 - 2 Standard drinks or equivalent per week    Comment: occassionally  . Drug use: No  . Sexual activity: Yes  Other Topics Concern  . Not on file  Social History Narrative   Caffeine: a couple mountain dews/day, occasional coffee   Lives alone, no pets   Occupation: works at [employer] -  Musician   Activity: no regular exercise, trying to restart routine   Diet: good water, vegetables daily, some fish/meat   Social Determinants of Health   Financial Resource Strain:   . Difficulty of Paying Living Expenses: Not on file  Food Insecurity:   . Worried About Charity fundraiser in the Last Year: Not on file  . Ran Out of Food in the Last Year: Not on file  Transportation Needs:   . Lack of Transportation (Medical): Not on file  . Lack of Transportation (Non-Medical): Not on file  Physical Activity:   . Days of Exercise per Week: Not on file  . Minutes of Exercise per Session: Not on file  Stress:   . Feeling of Stress : Not on file  Social Connections:   . Frequency of Communication with Friends and Family: Not on file  . Frequency of Social Gatherings with Friends and Family: Not on  file  . Attends Religious Services: Not on file  . Active Member of Clubs or Organizations: Not on file  . Attends Archivist Meetings: Not on file  . Marital Status: Not on file  Intimate Partner Violence:   . Fear of Current or Ex-Partner: Not on file  . Emotionally Abused: Not on file  . Physically Abused: Not on file  . Sexually Abused: Not on file    Current Outpatient Medications  Medication Sig Dispense Refill  . Accu-Chek FastClix Lancets MISC Use as directed to check blood sugar daily 100 each 1  . ACCU-CHEK GUIDE test strip USE AS INSTRUCTED 100 strip 1  . albuterol (PROVENTIL) (2.5 MG/3ML) 0.083% nebulizer solution USE 1 VIAL VIA NEBULIZER EVERY 6 HOURS AS NEEDED (Patient taking differently: Take 2.5 mg by nebulization every 6 (six) hours as needed for wheezing or shortness of breath. USE 1 VIAL VIA NEBULIZER EVERY 6 HOURS AS NEEDE) 150 mL 3  . Blood Glucose Monitoring Suppl (ACCU-CHEK GUIDE) w/Device KIT 1 each by Does not apply route as directed. 1 kit 0  . Cholecalciferol (VITAMIN D) 50 MCG (2000 UT) CAPS Take 2,000 Units by mouth in the  morning and at bedtime.     . cyanocobalamin (V-R VITAMIN B-12) 500 MCG tablet Take 1 tablet (500 mcg total) by mouth daily.    . finasteride (PROSCAR) 5 MG tablet TAKE 1 TABLET BY MOUTH EVERY DAY (Patient taking differently: Take 5 mg by mouth daily. TAKE 1 TABLET BY MOUTH EVERY DAY) 90 tablet 0  . Fluticasone-Salmeterol (ADVAIR DISKUS) 250-50 MCG/DOSE AEPB INHALE 1 PUFF BY MOUTH TWICE DAILY. RINSE MOUTH AFTER USE (Patient taking differently: Inhale 1 puff into the lungs in the morning and at bedtime. RINSE MOUTH AFTER USE) 180 each 0  . metFORMIN (GLUCOPHAGE) 500 MG tablet TAKE 1 TABLET (500 MG TOTAL) BY MOUTH 2 (TWO) TIMES DAILY WITH A MEAL. 180 tablet 0  . montelukast (SINGULAIR) 10 MG tablet TAKE 1 TABLET BY MOUTH EVERY NIGHT AT BEDTIME (Patient taking differently: Take 10 mg by mouth at bedtime. ) 90 tablet 2  . pantoprazole (PROTONIX) 40 MG tablet Take 40 mg by mouth at bedtime.    . simvastatin (ZOCOR) 80 MG tablet TAKE 1 TABLET BY MOUTH EVERYDAY AT BEDTIME (Patient taking differently: Take 80 mg by mouth at bedtime. ) 90 tablet 3  . valsartan-hydrochlorothiazide (DIOVAN-HCT) 160-25 MG tablet Take 1 tablet by mouth daily. 90 tablet 1  . VENTOLIN HFA 108 (90 Base) MCG/ACT inhaler Inhale 2 puffs into the lungs every 6 (six) hours as needed for wheezing or shortness of breath. 18 g 11   No current facility-administered medications for this visit.    Allergies  Allergen Reactions  . Ace Inhibitors Cough  . Codeine     REACTION: DIZZY / HEADACHE  . Tramadol Nausea And Vomiting      Review of Systems:   General:  normal appetite, decreased energy, no weight gain, no weight loss, no fever  Cardiac:  + chest pain with exertion, no chest pain at rest, +SOB with exertion, no resting SOB, no PND, + orthopnea, no palpitations, no arrhythmia, no atrial fibrillation, no LE edema, + dizzy spells, no syncope  Respiratory:  + shortness of breath, no home oxygen, + occasional productive cough, +  dry cough, no bronchitis, + wheezing, no hemoptysis, + asthma, no pain with inspiration or cough, no sleep apnea, no CPAP at night  GI:   no difficulty swallowing, no reflux,  no frequent heartburn, no hiatal hernia, no abdominal pain, no constipation, no diarrhea, no hematochezia, no hematemesis, no melena  GU:   no dysuria,  no frequency, no urinary tract infection, no hematuria, no enlarged prostate, no kidney stones, no kidney disease  Vascular:  no pain suggestive of claudication, no pain in feet, occasional leg cramps, no varicose veins, no DVT, no non-healing foot ulcer  Neuro:   no stroke, no TIA's, no seizures, no headaches, no temporary blindness one eye,  no slurred speech, no peripheral neuropathy, no chronic pain, no instability of gait, no memory/cognitive dysfunction  Musculoskeletal: no arthritis, no joint swelling, no myalgias, no difficulty walking, normal mobility   Skin:   no rash, no itching, no skin infections, no pressure sores or ulcerations  Psych:   no anxiety, no depression, no nervousness, no unusual recent stress  Eyes:   no blurry vision, no floaters, no recent vision changes, + wears glasses or contacts  ENT:   no hearing loss, no loose or painful teeth, no dentures, last saw dentist 3 months ago  Hematologic:  no easy bruising, no abnormal bleeding, no clotting disorder, no frequent epistaxis  Endocrine:  + diabetes, does check CBG's at home     Physical Exam:   BP (!) 165/95 (BP Location: Right Arm)   Pulse (!) 114   Temp 97.7 F (36.5 C) (Temporal)   Resp (!) 24   Ht '5\' 10"'$  (1.778 m)   Wt 238 lb (108 kg)   SpO2 94% Comment: RA  BMI 34.15 kg/m   General:  Obese male NAD    HEENT:  Unremarkable   Neck:   no JVD, no bruits, no adenopathy   Chest:   clear to auscultation, symmetrical breath sounds, no wheezes, no rhonchi   CV:   RRR, grade III/VI systolic murmur best RUSB  Abdomen:  soft, non-tender, no masses   Extremities:  warm, well-perfused, pulses  diminished, no LE edema  Rectal/GU  Deferred  Neuro:   Grossly non-focal and symmetrical throughout  Skin:   Clean and dry, no rashes, no breakdown   Diagnostic Tests:  ECHOCARDIOGRAM REPORT       Patient Name:  Erik Allen Date of Exam: 03/13/2019  Medical Rec #: [id number]    Height:    68.0 in  Accession #:  [id number]   Weight:    236.6 lb  Date of Birth: 11-Mar-1955    BSA:     2.19 m  Patient Age:  14 years    BP:      148/74 mmHg  Patient Gender: M        HR:      88 bpm.  Exam Location: [facility]   Procedure: 2D Echo, Cardiac Doppler, Color Doppler and Intracardiac       Opacification Agent   Indications:  I35.0 Nonrheumatic aortic (valve) stenosis    History:    Patient has prior history of Echocardiogram examinations,  most         recent 02/03/2018. Aortic Valve Disease,  Signs/Symptoms:Murmur         and Chest Pain; Risk Factors:Diabetes, Hypertension,         Dyslipidemia and Non-Smoker.    Sonographer:  Pilar Jarvis RDMS, RVT, RDCS  Referring Phys: Alligator    1. Left ventricular ejection fraction, by visual estimation, is 60 to  65%. The left ventricle has normal function. There is mildly increased  left ventricular hypertrophy.  2.  Left ventricular diastolic parameters are consistent with Grade I  diastolic dysfunction (impaired relaxation).  3. The left ventricle has no regional wall motion abnormalities.  4. Global right ventricle has normal systolic function.The right  ventricular size is normal. No increase in right ventricular wall  thickness.  5. Left atrial size was mildly dilated.  6. The aortic valve has severe calcification. Aortic valve regurgitation  is not visualized. Moderate to severe aortic valve stenosis.  7. Aortic valve area, by VTI measures 0.96 cm.  8. Aortic valve mean gradient measures 34.8 mmHg.    9. Aortic valve peak gradient measures 71.2 mmHg.  10. TR signal is inadequate for assessing pulmonary artery systolic  pressure.   FINDINGS  Left Ventricle: Left ventricular ejection fraction, by visual estimation,  is 60 to 65%. The left ventricle has normal function. The left ventricle  has no regional wall motion abnormalities. There is mildly increased left  ventricular hypertrophy. Left  ventricular diastolic parameters are consistent with Grade I diastolic  dysfunction (impaired relaxation). Normal left atrial pressure.   Right Ventricle: The right ventricular size is normal. No increase in  right ventricular wall thickness. Global RV systolic function is has  normal systolic function.   Left Atrium: Left atrial size was mildly dilated.   Right Atrium: Right atrial size was normal in size   Pericardium: There is no evidence of pericardial effusion.   Mitral Valve: The mitral valve is normal in structure. No evidence of  mitral valve regurgitation. No evidence of mitral valve stenosis by  observation.   Tricuspid Valve: The tricuspid valve is normal in structure. Tricuspid  valve regurgitation is not demonstrated.   Aortic Valve: The aortic valve is normal in structure. Aortic valve  regurgitation is not visualized. Moderate to severe aortic stenosis is  present. Aortic valve mean gradient measures 34.8 mmHg. Aortic valve peak  gradient measures 71.2 mmHg. Aortic valve  area, by VTI measures 0.96 cm.   Pulmonic Valve: The pulmonic valve was normal in structure. Pulmonic valve  regurgitation is trivial. Pulmonic regurgitation is trivial.   Aorta: The aortic root, ascending aorta and aortic arch are all  structurally normal, with no evidence of dilitation or obstruction.   Venous: The inferior vena cava is normal in size with greater than 50%  respiratory variability, suggesting right atrial pressure of 3 mmHg.   IAS/Shunts: No atrial level shunt detected by  color flow Doppler. There is  no evidence of a patent foramen ovale. No ventricular septal defect is  seen or detected. There is no evidence of an atrial septal defect.     LEFT VENTRICLE  PLAX 2D  LVIDd:     5.50 cm Diastology  LVIDs:     3.60 cm LV e' lateral:  7.94 cm/s  LV PW:     1.10 cm LV E/e' lateral: 12.4  LV IVS:    1.40 cm LV e' medial:  8.70 cm/s  LVOT diam:   2.10 cm LV E/e' medial: 11.3  LV SV:     93 ml  LV SV Index:  40.23  LVOT Area:   3.46 cm     RIGHT VENTRICLE  RV Basal diam: 3.90 cm  RV S prime:   12.90 cm/s  TAPSE (M-mode): 2.8 cm   LEFT ATRIUM       Index    RIGHT ATRIUM      Index  LA diam:    5.30 cm 2.41 cm/m RA Area:   15.20 cm  LA Vol (A2C):  79.6 ml 36.27 ml/m RA Volume:  36.30 ml 16.54 ml/m  LA Vol (A4C):  78.4 ml 35.72 ml/m  LA Biplane Vol: 88.1 ml 40.14 ml/m  AORTIC VALVE          PULMONIC VALVE  AV Area (Vmax):  0.95 cm   PV Vmax:    1.06 m/s  AV Area (Vmean):  0.93 cm   PV Peak grad: 4.5 mmHg  AV Area (VTI):   0.96 cm  AV Vmax:      422.00 cm/s  AV Vmean:     303.000 cm/s  AV VTI:      0.785 m  AV Peak Grad:   71.2 mmHg  AV Mean Grad:   34.8 mmHg  LVOT Vmax:     116.00 cm/s  LVOT Vmean:    81.200 cm/s  LVOT VTI:     0.218 m  LVOT/AV VTI ratio: 0.28    AORTA  Ao Root diam: 3.30 cm  Ao Asc diam: 3.30 cm  Ao Arch diam: 2.9 cm   MITRAL VALVE  MV Area (PHT): 2.83 cm       SHUNTS  MV PHT:    77.72 msec      Systemic VTI: 0.22 m  MV Decel Time: 268 msec       Systemic Diam: 2.10 cm  MV E velocity: 98.50 cm/s 103 cm/s  MV A velocity: 130.00 cm/s 70.3 cm/s  MV E/A ratio: 0.76    1.5     Ida Rogue MD  Electronically signed by Ida Rogue MD  Signature Date/Time: 03/13/2019/4:41:49 PM     RIGHT/LEFT HEART CATH AND CORONARY ANGIOGRAPHY  Conclusion  1.  Normal  coronary arteries. 2.  Right heart catheterization showed high normal filling pressure with pulmonary capillary wedge pressure of 11 mmHg, high normal pulmonary pressure at 31 over 18 mmHg and normal cardiac output at 7.04 with a cardiac index of 3.18. 3.  Not able to cross the aortic valve mainly due to significant tortuosity of the innominate artery which made torquing of the catheter was very difficult.  Calcified aortic valve with restricted opening noted on fluoroscopy.  Recommendations: Continue evaluation for aortic valve replacement. Consider bicuspid aortic valve as an etiology given the patient's relatively young age.  Indications  Aortic valve stenosis, etiology of cardiac valve disease unspecified [I35.0 (ICD-10-CM)]  Procedural Details  Technical Details Procedural Details: The pre-existing IV in the right antecubital vein was exchanged under sterile fashion to a slender sheath. The right wrist was prepped, draped, and anesthetized with 1% lidocaine. Using the modified Seldinger technique, a 5 French sheath was introduced into the right radial artery. 2.5 mg of verapamil was administered through the sheath, weight-based unfractionated heparin was administered intravenously. Right heart catheterization was performed using a 5 French Swan-Ganz catheter. Cardiac output was calculated by the Fick method.  I initially used a Jacky catheter but I had significant difficulty getting into the ascending aorta given tortuosity of the innominate artery and origin from the left side of the arch.  I had to use standard Judkins catheters.  I attempted to cross the aortic valve with a pigtail catheter, the Jacky catheter and the JR4 catheter.  I could not cross the valve mainly due to significant tortuosity of the innominate artery. There were no immediate procedural complications. A TR band was used for radial hemostasis at the completion of the procedure.  The patient was transferred to the post  catheterization recovery area for  further monitoring.  Estimated blood loss <50 mL.   During this procedure medications were administered to achieve and maintain moderate conscious sedation while the patient's heart rate, blood pressure, and oxygen saturation were continuously monitored and I was present face-to-face 100% of this time.   Sedation Time  Sedation Time Physician-1: 39 minutes 50 seconds  Contrast  Medication Name Total Dose  iohexol (OMNIPAQUE) 300 MG/ML solution 70 mL    Radiation/Fluoro  Fluoro time: 13 (min) DAP: 51.9 (Gycm2) Cumulative Air Kerma: 613 (mGy)  Coronary Findings  Diagnostic Dominance: Right Left Main  Vessel is angiographically normal.  Left Circumflex  Vessel is angiographically normal.  Right Coronary Artery  Vessel is angiographically normal.  Right Posterior Descending Artery  Vessel is angiographically normal.  Right Posterior Atrioventricular Artery  Vessel is angiographically normal.  Intervention  No interventions have been documented. Coronary Diagrams  Diagnostic Dominance: Right  Intervention  Implants   No implant documentation for this case.  Syngo Images  Show images for CARDIAC CATHETERIZATION  Images on Long Term Storage  Show images for Brysin, Towery to Procedure Log  Procedure Log    Hemo Data (last day) before discharge   AO Systolic Cath Pressure  AO Diastolic Cath Pressure  AO Mean Cath Pressure  PA Systolic Cath Pressure  PA Diastolic Cath Pressure  PA Mean Cath Pressure  RA Wedge A Wave  RA Wedge V Wave  RV Systolic Cath Pressure  RV Diastolic Cath Pressure  RV End Diastolic  PCW A Wave  PCW V Wave  PCW Mean  AO O2 Sat  PA O2 Sat  AO O2 Sat  Fick C.O.  Fick C.I.   --  --  --  --  --  --  11 mmHg  10 mmHg  --  --  --  15 mmHg  12 mmHg  11 mmHg  --  --  --  7.04 L/min  3.18 L/min/m2   --  --  --  --  --  --  --  --  35 mmHg  6 mmHg  12 mmHg  --  --  --  --  --  --  --  --   --  --  --  31 mmHg   18 mmHg  22 mmHg  --  --  --  --  --  --  --  --  --  --  --  --  --   138  86 mmHg  110 mmHg  --  --  --  --  --  --  --  --  --  --  --  --  --  --  --  --   148  90 mmHg  117 mmHg  --  --  --  --  --  --  --  --  --  --  --  --  --  --  --  --   --  --  --  --  --  --  --  --  --  --  --  --  --  --  --  PA  --  --  --   --  --  --  --  --  --  --  --                            Impression:  Patient has stage D severe symptomatic aortic stenosis.  He describes progressive  symptoms of exertional shortness of breath and fatigue consistent with chronic diastolic congestive heart failure, New York Heart Association functional class IIb.  I have personally reviewed the patient's recent transthoracic echocardiogram and diagnostic cardiac catheterization.  Echocardiogram reveals normal left ventricular systolic function.  There is severe aortic stenosis.  Acoustic windows are suboptimal but the valve appears to be trileaflet.  There is severe thickening with restricted leaflet mobility involving all 3 leaflets.  Peak velocity across aortic valve measured greater than 4.2 m/s consistent with severe aortic stenosis.  Diagnostic cardiac catheterization is notable for the absence of significant coronary artery disease.  I agree the patient would best be treated with aortic valve replacement.  Because of the patient's relatively young age and reasonably good life expectancy, he may best be treated with conventional surgery.   Plan:  The patient was counseled at length regarding treatment alternatives for management of severe aortic stenosis including continued medical therapy versus proceeding with aortic valve replacement in the near future.  The natural history of aortic stenosis was reviewed, as was long term prognosis with medical therapy alone.  Surgical options were discussed at length including conventional surgical aortic valve replacement through either a full median sternotomy or using minimally  invasive techniques.  Other alternatives including transcatheter aortic valve replacement, patch enlargement of the aortic root, stentless porcine aortic root replacement, valve repair, the Ross autograft procedure, and homograft aortic root replacement were discussed.  Discussion was held comparing the relative risks of mechanical valve replacement with need for lifelong anticoagulation versus use of a bioprosthetic tissue valve and the associated potential for late structural valve deterioration and failure.  The patient understands and accepts all potential associated risks of surgery including but not limited to risk of death, stroke, myocardial infarction, congestive heart failure, respiratory failure, renal failure, pneumonia, bleeding requiring blood transfusion and or reexploration, arrhythmia, heart block or bradycardia requiring permanent pacemaker, aortic dissection or other major vascular complication, pleural effusions or other delayed complications related to continued congestive heart failure, and other late complications related to valve replacement including structural valve deterioration and failure, thrombosis, endocarditis, or paravalvular leak.  The patient is interested in proceeding with aortic valve replacement in the near future.  As a next step the patient will undergo cardiac gated CT angiogram of the heart and CT angiogram of the chest/abdomen/pelvis to evaluate all possible options for valve replacement including both conventional surgical aortic valve replacement via minimally invasive techniques and transcatheter aortic valve replacement.   We tentatively plan to proceed with surgery on April 29, 2019.  The patient will return to our office for follow-up prior to surgery on April 18, 2019     I spent in excess of 90 minutes during the conduct of this office consultation and >50% of this time involved direct face-to-face encounter with the patient for counseling and/or  coordination of their care.    Valentina Gu. Roxy Manns, MD 04/10/2019 12:46 PM

## 2019-04-10 NOTE — Patient Instructions (Addendum)
Continue all previous medications without any changes at this time  At your appointment for Pre-Admission Testing at the [facility] Short-Stay Department you will be asked to sign permission forms for your upcoming surgery.  By definition your signature on these forms implies that you and/or your designee provide full informed consent for your planned surgical procedure(s), that alternative treatment options have been discussed, that you understand and accept any and all potential risks, and that you have some understanding of what to expect for your post-operative convalescence.  For any major cardiac surgical procedure potential operative risks include but are not limited to at least some risk of death, stroke or other neurologic complication, myocardial infarction, congestive heart failure, respiratory failure, renal failure, bleeding requiring blood transfusion and/or reexploration, irregular heart rhythm, heart block or bradycardia requiring permanent pacemaker, pneumonia, pericardial effusion, pleural effusion, wound infection, pulmonary embolus or other thromboembolic complication, chronic pain, or other complications related to the specific procedure(s) performed.  Please call to schedule a follow-up appointment in our office prior to surgery if you have any unresolved questions about your planned surgical procedure, the associated risks, alternative treatment options, and/or expectations for your post-operative recovery.

## 2019-04-16 ENCOUNTER — Telehealth (HOSPITAL_COMMUNITY): Payer: Self-pay | Admitting: Emergency Medicine

## 2019-04-16 NOTE — Telephone Encounter (Signed)
Reaching out to patient to offer assistance regarding upcoming cardiac imaging study; pt verbalizes understanding of appt date/time, parking situation and where to check in, pre-test NPO status and medications ordered, and verified current allergies; name and call back number provided for further questions should they arise Kaivon Livesey RN Navigator Cardiac Imaging [facility] [phone] office [phone] cell 

## 2019-04-17 ENCOUNTER — Ambulatory Visit (HOSPITAL_COMMUNITY)
Admission: RE | Admit: 2019-04-17 | Discharge: 2019-04-17 | Disposition: A | Discharge status: Home / Self Care | Payer: BC Managed Care – PPO | Source: Ambulatory Visit | Attending: Thoracic Surgery (Cardiothoracic Vascular Surgery) | Admitting: Thoracic Surgery (Cardiothoracic Vascular Surgery)

## 2019-04-17 ENCOUNTER — Other Ambulatory Visit: Payer: Self-pay

## 2019-04-17 ENCOUNTER — Encounter (HOSPITAL_COMMUNITY): Payer: Self-pay

## 2019-04-17 DIAGNOSIS — I350 Nonrheumatic aortic (valve) stenosis: Secondary | ICD-10-CM | POA: Insufficient documentation

## 2019-04-17 MED ORDER — IOHEXOL 350 MG/ML SOLN
100.0000 mL | Freq: Once | INTRAVENOUS | Status: AC | PRN
  Administered 2019-04-17: 100 mL via INTRAVENOUS

## 2019-04-21 ENCOUNTER — Other Ambulatory Visit: Payer: Self-pay | Admitting: Family Medicine

## 2019-04-24 ENCOUNTER — Other Ambulatory Visit: Payer: Self-pay | Admitting: Family Medicine

## 2019-04-24 NOTE — Pre-Procedure Instructions (Signed)
Erik Allen  04/24/2019      [id number] - [place], [place] - [address] [address] Phone: [phone] Fax: [phone]    Your procedure is scheduled on 04/29/19.  Report to [facility] Admitting at 630 A.M.  Call this number if you have problems the morning of surgery:  [phone]   Remember:  Do not eat or drink after midnight.  Y Take these medicines the morning of surgery with A SIP OF WATER ----ALL INHALERS,PROSCAR    Do not wear jewelry, make-up or nail polish.  Do not wear lotions, powders, or perfumes, or deodorant.  Do not shave 48 hours prior to surgery.  Men may shave face and neck.  Do not bring valuables to the hospital.  [facility] is not responsible for any belongings or valuables.  Contacts, dentures or bridgework may not be worn into surgery.  Leave your suitcase in the car.  After surgery it may be brought to your room.  For patients admitted to the hospital, discharge time will be determined by your treatment team.  Patients discharged the day of surgery will not be allowed to drive home.              DO NOT TAKE ANY DIABETIC MEDICATIONS DAY OF SURGERY Special instructions:  Do not take any,anti-inflammatories,vitamins,or herbal supplements 5-7 days prior to surgery.[place] - Preparing for Surgery  Before surgery, you can play an important role.  Because skin is not sterile, your skin needs to be as free of germs as possible.  You can reduce the number of germs on you skin by washing with CHG (chlorahexidine gluconate) soap before surgery.  CHG is an antiseptic cleaner which kills germs and bonds with the skin to continue killing germs even after washing.  Oral Hygiene is also important in reducing the risk of infection.  Remember to brush your teeth with your regular toothpaste the morning of surgery.  Please DO NOT use if you have an allergy to CHG or antibacterial soaps.  If your skin becomes  reddened/irritated stop using the CHG and inform your nurse when you arrive at Short Stay.  Do not shave (including legs and underarms) for at least 48 hours prior to the first CHG shower.  You may shave your face.  Please follow these instructions carefully:   1.  Shower with CHG Soap the night before surgery and the morning of Surgery.  2.  If you choose to wash your hair, wash your hair first as usual with your normal shampoo.  3.  After you shampoo, rinse your hair and body thoroughly to remove the shampoo. 4.  Use CHG as you would any other liquid soap.  You can apply chg directly to the skin and wash gently with a      scrungie or washcloth.           5.  Apply the CHG Soap to your body ONLY FROM THE NECK DOWN.   Do not use on open wounds or open sores. Avoid contact with your eyes, ears, mouth and genitals (private parts).  Wash genitals (private parts) with your normal soap.  6.  Wash thoroughly, paying special attention to the area where your surgery will be performed.  7.  Thoroughly rinse your body with warm water from the neck down.  8.  DO NOT shower/wash with your normal soap after using and rinsing off the CHG Soap.  9.  Pat yourself dry with a clean towel.            10.  Wear clean pajamas.            11.  Place clean sheets on your bed the night of your first shower and do not sleep with pets.  Day of Surgery  Do not apply any lotions/deoderants the morning of surgery.   Please wear clean clothes to the hospital/surgery center. Remember to brush your teeth with toothpaste.    Please read over the following fact sheets that you were given. MRSA Information

## 2019-04-27 ENCOUNTER — Telehealth: Payer: Self-pay | Admitting: Cardiology

## 2019-04-27 ENCOUNTER — Encounter (HOSPITAL_COMMUNITY)
Admission: RE | Admit: 2019-04-27 | Discharge: 2019-04-27 | Disposition: A | Discharge status: Home / Self Care | Payer: BC Managed Care – PPO | Source: Ambulatory Visit | Attending: Thoracic Surgery (Cardiothoracic Vascular Surgery) | Admitting: Thoracic Surgery (Cardiothoracic Vascular Surgery)

## 2019-04-27 ENCOUNTER — Encounter (HOSPITAL_COMMUNITY): Payer: Self-pay

## 2019-04-27 ENCOUNTER — Other Ambulatory Visit: Payer: Self-pay

## 2019-04-27 ENCOUNTER — Ambulatory Visit (HOSPITAL_COMMUNITY)
Admission: RE | Admit: 2019-04-27 | Discharge: 2019-04-27 | Disposition: A | Discharge status: Home / Self Care | Payer: BC Managed Care – PPO | Source: Ambulatory Visit | Attending: Thoracic Surgery (Cardiothoracic Vascular Surgery) | Admitting: Thoracic Surgery (Cardiothoracic Vascular Surgery)

## 2019-04-27 ENCOUNTER — Ambulatory Visit (INDEPENDENT_AMBULATORY_CARE_PROVIDER_SITE_OTHER): Payer: BC Managed Care – PPO | Admitting: Thoracic Surgery (Cardiothoracic Vascular Surgery)

## 2019-04-27 ENCOUNTER — Encounter: Payer: Self-pay | Admitting: Thoracic Surgery (Cardiothoracic Vascular Surgery)

## 2019-04-27 ENCOUNTER — Other Ambulatory Visit: Payer: Self-pay | Admitting: Family Medicine

## 2019-04-27 ENCOUNTER — Other Ambulatory Visit (HOSPITAL_COMMUNITY)
Admission: RE | Admit: 2019-04-27 | Discharge: 2019-04-27 | Disposition: A | Discharge status: Home / Self Care | Payer: BC Managed Care – PPO | Source: Ambulatory Visit | Attending: Thoracic Surgery (Cardiothoracic Vascular Surgery) | Admitting: Thoracic Surgery (Cardiothoracic Vascular Surgery)

## 2019-04-27 VITALS — BP 148/85 | HR 100 | Temp 98.1°F | Resp 20 | Ht 70.0 in | Wt 235.0 lb

## 2019-04-27 DIAGNOSIS — I350 Nonrheumatic aortic (valve) stenosis: Secondary | ICD-10-CM

## 2019-04-27 DIAGNOSIS — Z20822 Contact with and (suspected) exposure to covid-19: Secondary | ICD-10-CM | POA: Insufficient documentation

## 2019-04-27 HISTORY — DX: Type 2 diabetes mellitus without complications: E11.9

## 2019-04-27 LAB — COMPREHENSIVE METABOLIC PANEL
ALT: 72 U/L — ABNORMAL HIGH (ref 0–44)
AST: 46 U/L — ABNORMAL HIGH (ref 15–41)
Albumin: 4.1 g/dL (ref 3.5–5.0)
Alkaline Phosphatase: 53 U/L (ref 38–126)
Anion gap: 14 (ref 5–15)
BUN: 23 mg/dL (ref 8–23)
CO2: 23 mmol/L (ref 22–32)
Calcium: 9.4 mg/dL (ref 8.9–10.3)
Chloride: 103 mmol/L (ref 98–111)
Creatinine, Ser: 1.05 mg/dL (ref 0.61–1.24)
GFR calc Af Amer: >60 mL/min (ref >60)
GFR calc non Af Amer: >60 mL/min (ref >60)
Glucose, Bld: 117 mg/dL — ABNORMAL HIGH (ref 70–99)
Potassium: 3.7 mmol/L (ref 3.5–5.1)
Sodium: 140 mmol/L (ref 135–145)
Total Bilirubin: 0.6 mg/dL (ref 0.3–1.2)
Total Protein: 7.4 g/dL (ref 6.5–8.1)

## 2019-04-27 LAB — GLUCOSE, CAPILLARY: Glucose-Capillary: 112 mg/dL — ABNORMAL HIGH (ref 70–99)

## 2019-04-27 LAB — CBC
HCT: 46.5 % (ref 39.0–52.0)
Hemoglobin: 15.2 g/dL (ref 13.0–17.0)
MCH: 31.7 pg (ref 26.0–34.0)
MCHC: 32.7 g/dL (ref 30.0–36.0)
MCV: 96.9 fL (ref 80.0–100.0)
Platelets: 228 10*3/uL (ref 150–400)
RBC: 4.8 MIL/uL (ref 4.22–5.81)
RDW: 12.4 % (ref 11.5–15.5)
WBC: 9.9 10*3/uL (ref 4.0–10.5)
nRBC: 0 % (ref 0.0–0.2)

## 2019-04-27 LAB — URINALYSIS, ROUTINE W REFLEX MICROSCOPIC
Bilirubin Urine: NEGATIVE
Glucose, UA: NEGATIVE mg/dL
Hgb urine dipstick: NEGATIVE
Ketones, ur: NEGATIVE mg/dL
Leukocytes,Ua: NEGATIVE
Nitrite: NEGATIVE
Protein, ur: NEGATIVE mg/dL
Specific Gravity, Urine: 1.025 (ref 1.005–1.030)
pH: 7 (ref 5.0–8.0)

## 2019-04-27 LAB — BLOOD GAS, ARTERIAL
Acid-Base Excess: 0.9 mmol/L (ref 0.0–2.0)
Bicarbonate: 24.6 mmol/L (ref 20.0–28.0)
FIO2: 21
O2 Saturation: 96.9 %
Patient temperature: 37
pCO2 arterial: 37.1 mmHg (ref 32.0–48.0)
pH, Arterial: 7.437 (ref 7.350–7.450)
pO2, Arterial: 97.1 mmHg (ref 83.0–108.0)

## 2019-04-27 LAB — ABO/RH: ABO/RH(D): A POS

## 2019-04-27 LAB — HEMOGLOBIN A1C
Hgb A1c MFr Bld: 6.2 % — ABNORMAL HIGH (ref 4.8–5.6)
Mean Plasma Glucose: 131.24 mg/dL

## 2019-04-27 LAB — APTT: aPTT: 31 seconds (ref 24–36)

## 2019-04-27 LAB — SURGICAL PCR SCREEN
MRSA, PCR: NEGATIVE
Staphylococcus aureus: POSITIVE — AB

## 2019-04-27 LAB — PROTIME-INR
INR: 1 (ref 0.8–1.2)
Prothrombin Time: 12.6 seconds (ref 11.4–15.2)

## 2019-04-27 LAB — TYPE AND SCREEN
ABO/RH(D): A POS
Antibody Screen: NEGATIVE

## 2019-04-27 LAB — SARS CORONAVIRUS 2 (TAT 6-24 HRS): SARS Coronavirus 2: NEGATIVE

## 2019-04-27 NOTE — Progress Notes (Signed)
[address]       [place] [address]             [phone]     CARDIOTHORACIC SURGERY OFFICE NOTE  Referring Provider is Kate Sable, MD PCP is Ria Bush, MD   HPI:  Patient is a 64 year old obese white male with history of aortic stenosis, hypertension, hyperlipidemia, insulin-dependent type 2 diabetes mellitus, asthma, and GE reflux disease who returns to the office today with tentative plans to proceed with aortic valve replacement later this week.  He was originally seen in consultation on April 10, 2019.  He reports no new problems or complaints over that period of time.  He specifically denies any exposure to known persons with COVID-19 infection and/or complaints suspicious for the development of COVID-19 infection.   Current Outpatient Medications  Medication Sig Dispense Refill  . Accu-Chek FastClix Lancets MISC Use as directed to check blood sugar daily 100 each 1  . ACCU-CHEK GUIDE test strip USE AS INSTRUCTED 100 strip 1  . albuterol (PROVENTIL) (2.5 MG/3ML) 0.083% nebulizer solution USE 1 VIAL VIA NEBULIZER EVERY 6 HOURS AS NEEDED (Patient taking differently: Take 2.5 mg by nebulization every 6 (six) hours as needed for wheezing or shortness of breath. USE 1 VIAL VIA NEBULIZER EVERY 6 HOURS AS NEEDE) 150 mL 3  . Blood Glucose Monitoring Suppl (ACCU-CHEK GUIDE) w/Device KIT 1 each by Does not apply route as directed. 1 kit 0  . Cholecalciferol (VITAMIN D) 50 MCG (2000 UT) CAPS Take 2,000 Units by mouth in the morning and at bedtime.     . cyanocobalamin (V-R VITAMIN B-12) 500 MCG tablet Take 1 tablet (500 mcg total) by mouth daily.    . finasteride (PROSCAR) 5 MG tablet TAKE 1 TABLET BY MOUTH EVERY DAY (Patient taking differently: Take 5 mg by mouth daily. TAKE 1 TABLET BY MOUTH EVERY DAY) 90 tablet 0  . Fluticasone-Salmeterol (ADVAIR DISKUS) 250-50 MCG/DOSE AEPB INHALE 1 PUFF BY MOUTH TWICE DAILY. RINSE MOUTH AFTER USE 180 each 1    . metFORMIN (GLUCOPHAGE) 500 MG tablet TAKE 1 TABLET (500 MG TOTAL) BY MOUTH 2 (TWO) TIMES DAILY WITH A MEAL. 180 tablet 0  . montelukast (SINGULAIR) 10 MG tablet TAKE 1 TABLET BY MOUTH EVERY NIGHT AT BEDTIME (Patient taking differently: Take 10 mg by mouth at bedtime. ) 90 tablet 2  . pantoprazole (PROTONIX) 40 MG tablet Take 40 mg by mouth at bedtime.    . simvastatin (ZOCOR) 80 MG tablet TAKE 1 TABLET BY MOUTH EVERYDAY AT BEDTIME (Patient taking differently: Take 80 mg by mouth at bedtime. ) 90 tablet 3  . valsartan-hydrochlorothiazide (DIOVAN-HCT) 160-25 MG tablet Take 1 tablet by mouth daily. 90 tablet 1  . VENTOLIN HFA 108 (90 Base) MCG/ACT inhaler Inhale 2 puffs into the lungs every 6 (six) hours as needed for wheezing or shortness of breath. 18 g 11   No current facility-administered medications for this visit.      Physical Exam:   BP (!) 148/85 (BP Location: Left Arm, Patient Position: Sitting, Cuff Size: Normal)   Pulse 100   Temp 98.1 F (36.7 C) (Temporal)   Resp 20   Ht _0  (1.778 m)   Wt 235 lb (106.6 kg)   SpO2 96% Comment: RA  BMI 33.72 kg/m   General:  Well-appearing  Chest:   Clear to auscultation  CV:   Regular rate and rhythm with prominent systolic murmur  Incisions:  n/a  Abdomen:  Soft nontender  Extremities:  Warm and well-perfused  Diagnostic Tests:  Cardiac TAVR CT  TECHNIQUE: The patient was scanned on a [employer]. A 120 kV retrospective scan was triggered in the descending thoracic aorta at 111 HU's. Gantry rotation speed was 250 msecs and collimation was .6 mm. No beta blockade or nitro were given. The 3D data set was reconstructed in 5% intervals of the R-R cycle. Systolic and diastolic phases were analyzed on a dedicated work station using MPR, MIP and VRT modes. The patient received 80 cc of contrast.  FINDINGS: Image quality: Excellent. A millisecond reconstruction was performed due to motion artifact.  Noise  artifact is: Limited.  Valve Morphology: The aortic valve is severely calcified/thickened with restricted leaflet motion of all cusps.  Aortic Valve area: 1.02 cm2  Aortic Valve Calcium score: 1772  Aortic annular dimension:  Phase assessed: 30%  Annular area: 501 mm2  Annular perimeter: 81.3 mm  Max diameter: 29 mm  Min diameter: 22.5 mm  Annular and subannular calcification: No significant subannular or LVOT calcification.  Optimal coplanar projection: RAO 5 CRA 4  Coronary Artery Height above Annulus:  Left Main: 11.6 mm  Right Coronary: 14.7 mm  Sinus of Valsalva Measurements:  Non-coronary: 32 mm  Right-coronary: 30 mm  Left-coronary: 32 mm  Sinus of Valsalva Height: 19 mm  Sinotubular Junction: 29 mm.  Trace calcified plaque.  Ascending Thoracic Aorta: 34 mm.  No significant plaque.  Coronary Arteries: Normal coronary origin. Right dominance. The study was performed without use of NTG and is insufficient for plaque evaluation. Refer to recent cardiac cath for coronary assessment.  Cardiac Morphology:  Right Atrium: Right atrial size is within normal limits.  Right Ventricle: The right ventricular cavity is within normal limits.  Left Atrium: Left atrial size is normal in size with no left atrial appendage filling defect.  Left Ventricle: The ventricular cavity size is within normal limits. There are no stigmata of prior infarction. There is no abnormal filling defect. Normal left ventricular function without regional wall motion abnormalities. LVEF=75%.  Pulmonary arteries: Normal in size without proximal filling defect.  Pulmonary veins: Normal pulmonary venous drainage.  Pericardium: Normal thickness with no significant effusion or calcium present.  Mitral Valve: The mitral valve is normal structure with mild MAC.  Extra-cardiac findings: See attached radiology report for non-cardiac  structures.  IMPRESSION: 1. Annular measurements appropriate for 26 mm Edwards Sapien 3. No annular or sub-annular calcifications.  2. Sufficient coronary to annulus distance.  3. Optimum Fluoroscopic Angle for Delivery: RAO 5 CRA 4  4. No thrombus in the left atrial appendage.  Eleonore Chiquito, MD   Electronically Signed   By: Eleonore Chiquito   On: 04/17/2019 18:13   CT ANGIOGRAPHY CHEST, ABDOMEN AND PELVIS  TECHNIQUE: Multidetector CT imaging through the chest, abdomen and pelvis was performed using the standard protocol during bolus administration of intravenous contrast. Multiplanar reconstructed images and MIPs were obtained and reviewed to evaluate the vascular anatomy.  CONTRAST:  172m OMNIPAQUE IOHEXOL 350 MG/ML SOLN  COMPARISON:  None.  FINDINGS: CTA CHEST FINDINGS  Cardiovascular: Heart size is normal. There is no significant pericardial fluid, thickening or pericardial calcification. There is aortic atherosclerosis, as well as atherosclerosis of the great vessels of the mediastinum and the coronary arteries, including calcified atherosclerotic plaque in the left anterior descending coronary artery. Severe thickening and calcification of the aortic valve and moderate calcification of the inferior mitral annulus.  Mediastinum/Lymph Nodes: No  pathologically enlarged mediastinal or hilar lymph nodes. Esophagus is unremarkable in appearance. No axillary lymphadenopathy.  Lungs/Pleura: Tiny pulmonary nodules are noted in the right upper lobe, largest of which measures only 4 mm (axial image 45 of series 16). No acute consolidative airspace disease. No pleural effusions.  Musculoskeletal/Soft Tissues: There are no aggressive appearing lytic or blastic lesions noted in the visualized portions of the skeleton.  CTA ABDOMEN AND PELVIS FINDINGS  Hepatobiliary: No suspicious cystic or solid hepatic lesions. Diffuse low attenuation throughout the  hepatic parenchyma, indicative of hepatic steatosis. No intra or extrahepatic biliary ductal dilatation. Gallbladder is normal in appearance.  Pancreas: No pancreatic mass. No pancreatic ductal dilatation. No pancreatic or peripancreatic fluid collections or inflammatory changes.  Spleen: Unremarkable.  Adrenals/Urinary Tract: Bilateral kidneys and adrenal glands are normal in appearance. No hydroureteronephrosis. Urinary bladder is grossly unremarkable in appearance.  Stomach/Bowel: Normal appearance of the stomach. Small periampullary duodenal diverticulum, without surrounding inflammatory changes. No pathologic dilatation of small bowel or colon. Multiple colonic diverticulae are noted, without surrounding inflammatory changes to suggest an acute diverticulitis at this time. Normal appendix.  Vascular/Lymphatic: Vascular findings and measurements pertinent to potential TAVR procedure, as detailed above. Aortic atherosclerosis, without evidence of aneurysm or dissection in the abdominal or pelvic vasculature. No lymphadenopathy noted in the abdomen or pelvis.  Reproductive: Prostate gland and seminal vesicles are unremarkable in appearance.  Other: No significant volume of ascites.  No pneumoperitoneum.  Musculoskeletal: There are no aggressive appearing lytic or blastic lesions noted in the visualized portions of the skeleton.  VASCULAR MEASUREMENTS PERTINENT TO TAVR:  AORTA:  Minimal Aortic Diameter-17 x 17 mm  Severity of Aortic Calcification-minimal  RIGHT PELVIS:  Right Common Iliac Artery -  Minimal Diameter-11.0 x 10.5 mm  Tortuosity-mild  Calcification-none  Right External Iliac Artery -  Minimal Diameter-8.4 x 8.2 mm  Tortuosity-moderate  Calcification-none  Right Common Femoral Artery -  Minimal Diameter-7.9 x 7.5 mm  Tortuosity-mild  Calcification-none  LEFT PELVIS:  Left Common Iliac Artery -  Minimal  Diameter-11.4 x 10.1 mm  Tortuosity-mild  Calcification-minimal  Left External Iliac Artery -  Minimal Diameter-7.8 x 8.5 mm  Tortuosity-moderate  Calcification-none  Left Common Femoral Artery -  Minimal Diameter-8.7 x 8.0 mm  Tortuosity-mild  Calcification-none  Review of the MIP images confirms the above findings.  IMPRESSION: 1. Vascular findings and measurements pertinent to potential TAVR procedure, as detailed above. 2. Severe thickening calcification of the aortic valve, compatible with the reported clinical history of severe aortic stenosis. 3. Aortic atherosclerosis, in addition to left anterior descending coronary artery disease. 4. 4 mm right upper lobe pulmonary nodule, nonspecific, but statistically likely benign. No follow-up needed if patient is low-risk. Non-contrast chest CT can be considered in 12 months if patient is high-risk. This recommendation follows the consensus statement: Guidelines for Management of Incidental Pulmonary Nodules Detected on CT Images: From the Fleischner Society 2017; Radiology 2017; 284:228-243. 5. Additional incidental findings, as above.   Electronically Signed   By: Vinnie Langton M.D.   On: 04/17/2019 13:58   Impression:  Patient has stage D severe symptomatic aortic stenosis.  He describes progressive symptoms of exertional shortness of breath and fatigue consistent with chronic diastolic congestive heart failure, New York Heart Association functional class IIb.  I have personally reviewed the patient's recent transthoracic echocardiogram, diagnostic cardiac catheterization and CT angiograms.  Echocardiogram reveals normal left ventricular systolic function.  There is severe aortic stenosis.  Acoustic windows are suboptimal but the  valve appears to be trileaflet.  There is severe thickening with restricted leaflet mobility involving all 3 leaflets.  Peak velocity across aortic valve measured greater  than 4.2 m/s consistent with severe aortic stenosis.  Diagnostic cardiac catheterization is notable for the absence of significant coronary artery disease.  I agree the patient would best be treated with aortic valve replacement.  Because of the patient's relatively young age and reasonably good life expectancy, he may best be treated with conventional surgery.  He appears to be an acceptable candidate for minimally invasive approach for surgery.    Plan:  The patient was again counseled at length regarding treatment alternatives for management of severe aortic stenosis including continued medical therapy versus proceeding with aortic valve replacement in the near future.  The natural history of aortic stenosis was reviewed, as was long term prognosis with medical therapy alone.  Surgical options were discussed at length including conventional surgical aortic valve replacement through either a full median sternotomy or using minimally invasive techniques.  Other alternatives including transcatheter aortic valve replacement, patch enlargement of the aortic root, stentless porcine aortic root replacement, and homograft aortic root replacement were discussed.  Discussion was held comparing the relative risks of mechanical valve replacement with need for lifelong anticoagulation versus use of a bioprosthetic tissue valve and the associated potential for late structural valve deterioration and failure.  The patient understands and accepts all potential associated risks of surgery including but not limited to risk of death, stroke, myocardial infarction, congestive heart failure, respiratory failure, renal failure, pneumonia, bleeding requiring blood transfusion and or reexploration, arrhythmia, heart block or bradycardia requiring permanent pacemaker, aortic dissection or other major vascular complication, pleural effusions or other delayed complications related to continued congestive heart failure, and other late  complications related to valve replacement including structural valve deterioration and failure, thrombosis, endocarditis, or paravalvular leak.     I spent in excess of 15 minutes during the conduct of this office consultation and >50% of this time involved direct face-to-face encounter with the patient for counseling and/or coordination of their care.    Valentina Gu. Roxy Manns, MD 04/27/2019 11:09 AM

## 2019-04-27 NOTE — Patient Instructions (Addendum)
Stop taking metformin  Continue taking all other medications without change through the day before surgery.  Make sure to bring all of your medications with you when you come for your Pre-Admission Testing appointment at [facility] Short-Stay Department.  Have nothing to eat or drink after midnight the night before surgery.  On the morning of surgery take only Protonix with a sip of water.  At your appointment for Pre-Admission Testing at the [facility] Short-Stay Department you will be asked to sign permission forms for your upcoming surgery.  By definition your signature on these forms implies that you and/or your designee provide full informed consent for your planned surgical procedure(s), that alternative treatment options have been discussed, that you understand and accept any and all potential risks, and that you have some understanding of what to expect for your post-operative convalescence.  For any major cardiac surgical procedure potential operative risks include but are not limited to at least some risk of death, stroke or other neurologic complication, myocardial infarction, congestive heart failure, respiratory failure, renal failure, bleeding requiring blood transfusion and/or reexploration, irregular heart rhythm, heart block or bradycardia requiring permanent pacemaker, pneumonia, pericardial effusion, pleural effusion, wound infection, pulmonary embolus or other thromboembolic complication, chronic pain, or other complications related to the specific procedure(s) performed.  Please call to schedule a follow-up appointment in our office prior to surgery if you have any unresolved questions about your planned surgical procedure, the associated risks, alternative treatment options, and/or expectations for your post-operative recovery.

## 2019-04-27 NOTE — Progress Notes (Signed)
PCP - Dr Danise Mina Cardiologist -Dr Marko Plume   Aware of surgery     Chest x-ray - today EKG - yoday Stress Test - na ECHO - 2/21 Cardiac Cath - 2/22  Sleep Study - na CPAP - na  Fasting Blood Sugar - 114 Checks Blood Sugar __1___ times a day  Blood Thinner Instructions:no Aspirin Instructions:stop  ERAS Protcol -na PRE-SURGERY Ensure or G2- na  COVID TEST-  For today  Anesthesia review: heart hx  Patient denies shortness of breath, fever, cough and chest pain at PAT appointment   All instructions explained to the patient, with a verbal understanding of the material. Patient agrees to go over the instructions while at home for a better understanding. Patient also instructed to self quarantine after being tested for COVID-19. The opportunity to ask questions was provided.

## 2019-04-27 NOTE — Progress Notes (Signed)
VASCULAR LAB PRELIMINARY  PRELIMINARY  PRELIMINARY  PRELIMINARY  Pre CABG Dopplers completed.    Preliminary report:  See CV proc for preliminary results.   Ennifer Harston, RVT 04/27/2019, 9:29 AM

## 2019-04-27 NOTE — Telephone Encounter (Signed)
Received disability benefits form from [employer] in [employer] and sent to [employer]

## 2019-04-28 MED ORDER — MANNITOL 20 % IV SOLN
Freq: Once | INTRAVENOUS | Status: DC
  Filled 2019-04-28: qty 13

## 2019-04-28 MED ORDER — NITROGLYCERIN IN D5W 200-5 MCG/ML-% IV SOLN
2.0000 ug/min | INTRAVENOUS | Status: DC
  Filled 2019-04-28: qty 250

## 2019-04-28 MED ORDER — INSULIN REGULAR(HUMAN) IN NACL 100-0.9 UT/100ML-% IV SOLN
INTRAVENOUS | Status: DC
  Filled 2019-04-28: qty 100

## 2019-04-28 MED ORDER — VANCOMYCIN HCL 1000 MG IV SOLR
INTRAVENOUS | Status: AC
  Administered 2019-04-29: 1000 mL
  Filled 2019-04-28: qty 1000

## 2019-04-28 MED ORDER — PHENYLEPHRINE HCL-NACL 20-0.9 MG/250ML-% IV SOLN
30.0000 ug/min | INTRAVENOUS | Status: DC
  Filled 2019-04-28: qty 250

## 2019-04-28 MED ORDER — VANCOMYCIN HCL 1500 MG/300ML IV SOLN
1500.0000 mg | INTRAVENOUS | Status: AC
  Administered 2019-04-29: 1500 mg via INTRAVENOUS
  Filled 2019-04-28: qty 300

## 2019-04-28 MED ORDER — PLASMA-LYTE 148 IV SOLN
INTRAVENOUS | Status: AC
  Administered 2019-04-29: 500 mL
  Filled 2019-04-28: qty 2.5

## 2019-04-28 MED ORDER — EPINEPHRINE HCL 5 MG/250ML IV SOLN IN NS
0.0000 ug/min | INTRAVENOUS | Status: DC
  Filled 2019-04-28: qty 250

## 2019-04-28 MED ORDER — MILRINONE LACTATE IN DEXTROSE 20-5 MG/100ML-% IV SOLN
0.3000 ug/kg/min | INTRAVENOUS | Status: DC
  Filled 2019-04-28: qty 100

## 2019-04-28 MED ORDER — TRANEXAMIC ACID 1000 MG/10ML IV SOLN
1.5000 mg/kg/h | INTRAVENOUS | Status: DC
  Filled 2019-04-28: qty 25

## 2019-04-28 MED ORDER — SODIUM CHLORIDE 0.9 % IV SOLN
750.0000 mg | INTRAVENOUS | Status: DC
  Filled 2019-04-28: qty 750

## 2019-04-28 MED ORDER — NOREPINEPHRINE 4 MG/250ML-% IV SOLN
0.0000 ug/min | INTRAVENOUS | Status: DC
  Filled 2019-04-28: qty 250

## 2019-04-28 MED ORDER — SODIUM CHLORIDE 0.9 % IV SOLN
INTRAVENOUS | Status: DC
  Filled 2019-04-28: qty 30

## 2019-04-28 MED ORDER — TRANEXAMIC ACID (OHS) BOLUS VIA INFUSION
15.0000 mg/kg | INTRAVENOUS | Status: AC
  Administered 2019-04-29: 1599 mg via INTRAVENOUS
  Filled 2019-04-28: qty 1599

## 2019-04-28 MED ORDER — DEXMEDETOMIDINE HCL IN NACL 400 MCG/100ML IV SOLN
0.1000 ug/kg/h | INTRAVENOUS | Status: DC
  Filled 2019-04-28: qty 100

## 2019-04-28 MED ORDER — TRANEXAMIC ACID (OHS) PUMP PRIME SOLUTION
2.0000 mg/kg | INTRAVENOUS | Status: DC
  Filled 2019-04-28: qty 2.13

## 2019-04-28 MED ORDER — POTASSIUM CHLORIDE 2 MEQ/ML IV SOLN
80.0000 meq | INTRAVENOUS | Status: DC
  Filled 2019-04-28: qty 40

## 2019-04-28 MED ORDER — SODIUM CHLORIDE 0.9 % IV SOLN
1.5000 g | INTRAVENOUS | Status: AC
  Administered 2019-04-29: 1.5 g via INTRAVENOUS
  Filled 2019-04-28: qty 1.5

## 2019-04-28 NOTE — H&P (Signed)
[address]       ,[place] [address]             [phone]          CARDIOTHORACIC SURGERY HISTORY AND PHYSICAL EXAM  Referring Provider is Kate Sable, MD PCP is Ria Bush, MD  Chief Complaint  Patient presents with  . Aortic Stenosis    Surgical consultation    HPI:  Patient is a 64 year old obese white male with history of aortic stenosis, hypertension, hyperlipidemia, insulin-dependent type 2 diabetes mellitus, asthma, and GE reflux disease who has been referred for surgical consultation to discuss treatment options for management of severe symptomatic aortic stenosis.  Patient has reported history of scarlet fever during childhood and has been told that he has a heart murmur all of his life.  More recently he has been diagnosed with aortic stenosis for which she has been followed with serial echocardiograms.  Echocardiogram performed in 2019 revealed normal left ventricular systolic function with what was reported to be moderate aortic stenosis.  Peak velocity across aortic valve was measured 4.1 m/s corresponding to mean transvalvular gradient estimated 31 mmHg and aortic valve area calculated 1.1 cm with DVI measured 0.5 at that time.  He was seen in follow-up recently by his primary care physician and reported symptoms of exertional shortness of breath.  He was referred for cardiology consultation and has been evaluated previously by Dr. Garen Lah.  Follow-up echocardiogram performed March 13, 2019 revealed severe aortic stenosis.  Peak velocity across aortic valve measured 4.2 m/s corresponding to mean transvalvular gradient estimated 35 mmHg and aortic valve area calculated 0.95 cm with DVI measured 0.28.  Diagnostic cardiac catheterization was performed April 06, 2019 and revealed normal coronary artery anatomy with no significant coronary artery disease.  Right heart pressures were mildly elevated.  Attempts to cross the aortic  valve were unsuccessful due to severe calcification.  Cardiothoracic surgical consultation was requested.  Patient is single and lives alone in [place].  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  The patient has been obese much of his adult life and does not exercise at all on a regular basis.  He states that he used to walk regularly but he had to give that up 2 or 3 years ago because of the development of worsening fatigue and exertional shortness of breath.  He states that over the past couple of years he has noticed significant increased fatigue with decreased energy and exertional shortness of breath and chest tightness.  He denies any resting shortness of breath, PND, orthopnea, or lower extremity edema.  He has had occasional slight dizzy spells without any frank syncopal episodes.  He has a cough that is occasionally productive and made worse by laying flat in bed at night.  He also describes exertional shortness of breath which he attributes to asthma.  Past Medical History:  Diagnosis Date  . Aortic stenosis 06/21/2014   Echo 01/2018: Mild LVH, normal sys fxn EF 55-60%, G1DD, moderate AS with mildly dilated LA. rec yearly echocardiogram  . Asthma 09/13/99  . BENIGN PROSTATIC HYPERTROPHY 05/14/2003  . CAP (community acquired pneumonia) 01/31/2015  . Diabetes mellitus without complication ([place])   . Disorder of vocal cord 2012   h/o thrush on vocal cords per patient  . GERD (gastroesophageal reflux disease) 10/13/01   ENT eval for GERD and thrush 2011  . History of kidney stones   .  HTN (hypertension)   . Hyperlipemia 10/13/97  . Seasonal allergic rhinitis    worse in spring  . Systolic murmur   . Transaminitis 2014   presumed fatty liver without [place], normal iron and viral hep panel    Past Surgical History:  Procedure Laterality Date  . A1 pulley release  10/2010   stensosing tenosynovitis, R milddle, ring, little fingers   . COLONOSCOPY  2010   WNL, rpt due 10 yrs  . CYSTECTOMY     from back  . CYSTOSCOPY  08/02   Stone retrieval  . EAR CYST EXCISION Left 08/02/2016   Procedure: EXCISION MUCOID CYST LEFT MIDDLE FINGER WITH DISTAL INTERPHALANGEAL JOINT [place];  Surgeon: Daryll Brod, MD;  Location: [facility];  Service: Orthopedics;  Laterality: Left;  . FOOT CAPSULE RELEASE W/ PERCUTANEOUS HEEL CORD LENGTHENING, TIBIAL TENDON TRANSFER  04/09   bilateral, Dr. Milinda Pointer  . INGUINAL HERNIA REPAIR  06/28/09   left, with mesh, Dr. Bary Castilla  . RIGHT/LEFT HEART CATH AND CORONARY ANGIOGRAPHY Bilateral 04/06/2019   Procedure: RIGHT/LEFT HEART CATH AND CORONARY ANGIOGRAPHY;  Surgeon: Wellington Hampshire, MD;  Location: [facility];  Service: Cardiovascular;  Laterality: Bilateral;  . ROTATION FLAP Left 08/02/2016   Procedure: ROTATION DORSAL FLAP;  Surgeon: Daryll Brod, MD;  Location: [facility];  Service: Orthopedics;  Laterality: Left;  . US ECHOCARDIOGRAPHY  09/01   TR, MR    Family History  Problem Relation Age of Onset  . Hypertension Mother   . Cancer Father 57       Lymphoma, recurrent  . Hypertension Father   . Hypertension Brother     Social History Social History   Tobacco Use  . Smoking status: Never Smoker  . Smokeless tobacco: Never Used  Substance Use Topics  . Alcohol use: Yes    Alcohol/week: 1.0 - 2.0 standard drinks    Types: 1 - 2 Standard drinks or equivalent per week    Comment: occassionally  . Drug use: No    Prior to Admission medications   Medication Sig Start Date End Date Taking? Authorizing Provider  Accu-Chek FastClix Lancets MISC Use as directed to check blood sugar daily 12/09/18   Ria Bush, MD  ACCU-CHEK GUIDE test strip USE AS INSTRUCTED 04/03/19   Ria Bush, MD  albuterol (PROVENTIL) (2.5 MG/3ML) 0.083% nebulizer solution USE 1 VIAL VIA NEBULIZER EVERY 6 HOURS AS NEEDED Patient taking differently: Take 2.5 mg by  nebulization every 6 (six) hours as needed for wheezing or shortness of breath. USE 1 VIAL VIA NEBULIZER EVERY 6 HOURS AS NEEDE 04/17/18   Ria Bush, MD  albuterol (VENTOLIN HFA) 108 (90 Base) MCG/ACT inhaler INHALE 2 PUFFS INTO THE LUNGS EVERY 6 HOURS AS NEEDED FOR WHEEZING OR SHORTNESS OF BREATH 04/28/19   Ria Bush, MD  Blood Glucose Monitoring Suppl (ACCU-CHEK GUIDE) w/Device KIT 1 each by Does not apply route as directed. 12/09/18   Ria Bush, MD  Cholecalciferol (VITAMIN D) 50 MCG (2000 UT) CAPS Take 2,000 Units by mouth in the morning and at bedtime.     [provider]  cyanocobalamin (V-R VITAMIN B-12) 500 MCG tablet Take 1 tablet (500 mcg total) by mouth daily. 11/06/16   Ria Bush, MD  finasteride (PROSCAR) 5 MG tablet TAKE 1 TABLET BY MOUTH EVERY DAY Patient taking differently: Take 5 mg by mouth daily. TAKE 1 TABLET BY MOUTH EVERY DAY 02/23/19   Ria Bush, MD  Fluticasone-Salmeterol (ADVAIR DISKUS) [phone]  MCG/DOSE AEPB INHALE 1 PUFF BY MOUTH TWICE DAILY. RINSE MOUTH AFTER USE 04/24/19   Ria Bush, MD  metFORMIN (GLUCOPHAGE) 500 MG tablet TAKE 1 TABLET (500 MG TOTAL) BY MOUTH 2 (TWO) TIMES DAILY WITH A MEAL. 04/21/19   Ria Bush, MD  montelukast (SINGULAIR) 10 MG tablet TAKE 1 TABLET BY MOUTH EVERY NIGHT AT BEDTIME Patient taking differently: Take 10 mg by mouth at bedtime.  10/07/18   Ria Bush, MD  pantoprazole (PROTONIX) 40 MG tablet Take 40 mg by mouth at bedtime. 03/04/19   [provider]  simvastatin (ZOCOR) 80 MG tablet TAKE 1 TABLET BY MOUTH EVERYDAY AT BEDTIME Patient taking differently: Take 80 mg by mouth at bedtime.  09/24/18   Ria Bush, MD  valsartan-hydrochlorothiazide (DIOVAN-HCT) 160-25 MG tablet Take 1 tablet by mouth daily. 02/24/19   Ria Bush, MD    Allergies  Allergen Reactions  . Ace Inhibitors Cough  . Codeine Other (See Comments)    REACTION: DIZZY / HEADACHE  . Tramadol  Nausea And Vomiting      Review of Systems:              General:                      normal appetite, decreased energy, no weight gain, no weight loss, no fever             Cardiac:                       + chest pain with exertion, no chest pain at rest, +SOB with exertion, no resting SOB, no PND, + orthopnea, no palpitations, no arrhythmia, no atrial fibrillation, no LE edema, + dizzy spells, no syncope             Respiratory:                 + shortness of breath, no home oxygen, + occasional productive cough, + dry cough, no bronchitis, + wheezing, no hemoptysis, + asthma, no pain with inspiration or cough, no sleep apnea, no CPAP at night             GI:                               no difficulty swallowing, no reflux, no frequent heartburn, no hiatal hernia, no abdominal pain, no constipation, no diarrhea, no hematochezia, no hematemesis, no melena             GU:                              no dysuria,  no frequency, no urinary tract infection, no hematuria, no enlarged prostate, no kidney stones, no kidney disease             Vascular:                     no pain suggestive of claudication, no pain in feet, occasional leg cramps, no varicose veins, no DVT, no non-healing foot ulcer             Neuro:                         no stroke, no TIA's, no seizures, no headaches, no temporary blindness one eye,  no slurred speech, no peripheral neuropathy, no chronic pain, no instability of gait, no memory/cognitive dysfunction             Musculoskeletal:         no arthritis, no joint swelling, no myalgias, no difficulty walking, normal mobility              Skin:                            no rash, no itching, no skin infections, no pressure sores or ulcerations             Psych:                         no anxiety, no depression, no nervousness, no unusual recent stress             Eyes:                           no blurry vision, no floaters, no recent vision changes, + wears glasses or  contacts             ENT:                            no hearing loss, no loose or painful teeth, no dentures, last saw dentist 3 months ago             Hematologic:               no easy bruising, no abnormal bleeding, no clotting disorder, no frequent epistaxis             Endocrine:                   + diabetes, does check CBG's at home                           Physical Exam:              BP (!) 165/95 (BP Location: Right Arm)   Pulse (!) 114   Temp 97.7 F (36.5 C) (Temporal)   Resp (!) 24   Ht 5' 10" (1.778 m)   Wt 238 lb (108 kg)   SpO2 94% Comment: RA  BMI 34.15 kg/m              General:                      Obese male NAD               HEENT:                       Unremarkable              Neck:                           no JVD, no bruits, no adenopathy              Chest:                          clear to auscultation, symmetrical breath sounds, no wheezes, no rhonchi  CV:                              RRR, grade III/VI systolic murmur best RUSB             Abdomen:                    soft, non-tender, no masses              Extremities:                 warm, well-perfused, pulses diminished, no LE edema             Rectal/GU                   Deferred             Neuro:                         Grossly non-focal and symmetrical throughout             Skin:                            Clean and dry, no rashes, no breakdown   Diagnostic Tests:  ECHOCARDIOGRAM REPORT       Patient Name:  HILLMAN ATTIG Date of Exam: 03/13/2019  Medical Rec #: [id number]    Height:    68.0 in  Accession #:  [id number]   Weight:    236.6 lb  Date of Birth: 07-26-1955    BSA:     2.19 m  Patient Age:  52 years    BP:      148/74 mmHg  Patient Gender: M        HR:      88 bpm.  Exam Location: [facility]   Procedure: 2D Echo, Cardiac Doppler, Color Doppler and Intracardiac       Opacification Agent   Indications:   I35.0 Nonrheumatic aortic (valve) stenosis    History:    Patient has prior history of Echocardiogram examinations,  most         recent 02/03/2018. Aortic Valve Disease,  Signs/Symptoms:Murmur         and Chest Pain; Risk Factors:Diabetes, Hypertension,         Dyslipidemia and Non-Smoker.    Sonographer:  Pilar Jarvis RDMS, RVT, RDCS  Referring Phys: [place]    1. Left ventricular ejection fraction, by visual estimation, is 60 to  65%. The left ventricle has normal function. There is mildly increased  left ventricular hypertrophy.  2. Left ventricular diastolic parameters are consistent with Grade I  diastolic dysfunction (impaired relaxation).  3. The left ventricle has no regional wall motion abnormalities.  4. Global right ventricle has normal systolic function.The right  ventricular size is normal. No increase in right ventricular wall  thickness.  5. Left atrial size was mildly dilated.  6. The aortic valve has severe calcification. Aortic valve regurgitation  is not visualized. Moderate to severe aortic valve stenosis.  7. Aortic valve area, by VTI measures 0.96 cm.  8. Aortic valve mean gradient measures 34.8 mmHg.  9. Aortic valve peak gradient measures 71.2 mmHg.  10. TR signal is inadequate for assessing pulmonary artery systolic  pressure.   FINDINGS  Left Ventricle: Left ventricular ejection fraction,  by visual estimation,  is 60 to 65%. The left ventricle has normal function. The left ventricle  has no regional wall motion abnormalities. There is mildly increased left  ventricular hypertrophy. Left  ventricular diastolic parameters are consistent with Grade I diastolic  dysfunction (impaired relaxation). Normal left atrial pressure.   Right Ventricle: The right ventricular size is normal. No increase in  right ventricular wall thickness. Global RV systolic function is has  normal  systolic function.   Left Atrium: Left atrial size was mildly dilated.   Right Atrium: Right atrial size was normal in size   Pericardium: There is no evidence of pericardial effusion.   Mitral Valve: The mitral valve is normal in structure. No evidence of  mitral valve regurgitation. No evidence of mitral valve stenosis by  observation.   Tricuspid Valve: The tricuspid valve is normal in structure. Tricuspid  valve regurgitation is not demonstrated.   Aortic Valve: The aortic valve is normal in structure. Aortic valve  regurgitation is not visualized. Moderate to severe aortic stenosis is  present. Aortic valve mean gradient measures 34.8 mmHg. Aortic valve peak  gradient measures 71.2 mmHg. Aortic valve  area, by VTI measures 0.96 cm.   Pulmonic Valve: The pulmonic valve was normal in structure. Pulmonic valve  regurgitation is trivial. Pulmonic regurgitation is trivial.   Aorta: The aortic root, ascending aorta and aortic arch are all  structurally normal, with no evidence of dilitation or obstruction.   Venous: The inferior vena cava is normal in size with greater than 50%  respiratory variability, suggesting right atrial pressure of 3 mmHg.   IAS/Shunts: No atrial level shunt detected by color flow Doppler. There is  no evidence of a patent foramen ovale. No ventricular septal defect is  seen or detected. There is no evidence of an atrial septal defect.     LEFT VENTRICLE  PLAX 2D  LVIDd:     5.50 cm Diastology  LVIDs:     3.60 cm LV e' lateral:  7.94 cm/s  LV PW:     1.10 cm LV E/e' lateral: 12.4  LV IVS:    1.40 cm LV e' medial:  8.70 cm/s  LVOT diam:   2.10 cm LV E/e' medial: 11.3  LV SV:     93 ml  LV SV Index:  40.23  LVOT Area:   3.46 cm     RIGHT VENTRICLE  RV Basal diam: 3.90 cm  RV S prime:   12.90 cm/s  TAPSE (M-mode): 2.8 cm   LEFT ATRIUM       Index    RIGHT ATRIUM      Index  LA diam:     5.30 cm 2.41 cm/m RA Area:   15.20 cm  LA Vol (A2C):  79.6 ml 36.27 ml/m RA Volume:  36.30 ml 16.54 ml/m  LA Vol (A4C):  78.4 ml 35.72 ml/m  LA Biplane Vol: 88.1 ml 40.14 ml/m  AORTIC VALVE          PULMONIC VALVE  AV Area (Vmax):  0.95 cm   PV Vmax:    1.06 m/s  AV Area (Vmean):  0.93 cm   PV Peak grad: 4.5 mmHg  AV Area (VTI):   0.96 cm  AV Vmax:      422.00 cm/s  AV Vmean:     303.000 cm/s  AV VTI:      0.785 m  AV Peak Grad:   71.2 mmHg  AV Mean Grad:   34.8  mmHg  LVOT Vmax:     116.00 cm/s  LVOT Vmean:    81.200 cm/s  LVOT VTI:     0.218 m  LVOT/AV VTI ratio: 0.28    AORTA  Ao Root diam: 3.30 cm  Ao Asc diam: 3.30 cm  Ao Arch diam: 2.9 cm   MITRAL VALVE  MV Area (PHT): 2.83 cm       SHUNTS  MV PHT:    77.72 msec      Systemic VTI: 0.22 m  MV Decel Time: 268 msec       Systemic Diam: 2.10 cm  MV E velocity: 98.50 cm/s 103 cm/s  MV A velocity: 130.00 cm/s 70.3 cm/s  MV E/A ratio: 0.76    1.5     Ida Rogue MD  Electronically signed by Ida Rogue MD  Signature Date/Time: 03/13/2019/4:41:49 PM     RIGHT/LEFT HEART CATH AND CORONARY ANGIOGRAPHY  Conclusion  1. Normal coronary arteries. 2. Right heart catheterization showed high normal filling pressure with pulmonary capillary wedge pressure of 11 mmHg, high normal pulmonary pressure at 31 over 18 mmHg and normal cardiac output at 7.04 with a cardiac index of 3.18. 3. Not able to cross the aortic valve mainly due to significant tortuosity of the innominate artery which made torquing of the catheter was very difficult. Calcified aortic valve with restricted opening noted on fluoroscopy.  Recommendations: Continue evaluation for aortic valve replacement. Consider bicuspid aortic valve as an etiology given the patient's relatively young age.  Indications  Aortic valve stenosis, etiology of  cardiac valve disease unspecified [I35.0 (ICD-10-CM)]  Procedural Details  Technical Details Procedural Details: The pre-existing IV in the right antecubital vein was exchanged under sterile fashion to a slender sheath. The right wrist was prepped, draped, and anesthetized with 1% lidocaine. Using the modified Seldinger technique, a 5 French sheath was introduced into the right radial artery. 2.5 mg of verapamil was administered through the sheath, weight-based unfractionated heparin was administered intravenously. Right heart catheterization was performed using a 5 French Swan-Ganz catheter. Cardiac output was calculated by the Fick method. I initially used a Jacky catheter but I had significant difficulty getting into the ascending aorta given tortuosity of the innominate artery and origin from the left side of the arch. I had to use standard Judkins catheters. I attempted to cross the aortic valve with a pigtail catheter, the Jacky catheter and the JR4 catheter. I could not cross the valve mainly due to significant tortuosity of the innominate artery. There were no immediate procedural complications. A TR band was used for radial hemostasis at the completion of the procedure. The patient was transferred to the post catheterization recovery area for further monitoring.  Estimated blood loss <50 mL.   During this procedure medications were administered to achieve and maintain moderate conscious sedation while the patient's heart rate, blood pressure, and oxygen saturation were continuously monitored and I was present face-to-face 100% of this time.   Sedation Time  Sedation Time Physician-1: 39 minutes 50 seconds  Contrast  Medication Name Total Dose  iohexol (OMNIPAQUE) 300 MG/ML solution 70 mL    Radiation/Fluoro  Fluoro time: 13 (min) DAP: 51.9 (Gycm2) Cumulative Air Kerma: 613 (mGy)  Coronary Findings  Diagnostic Dominance: Right Left Main  Vessel is angiographically  normal.  Left Circumflex  Vessel is angiographically normal.  Right Coronary Artery  Vessel is angiographically normal.  Right Posterior Descending Artery  Vessel is angiographically normal.  Right Posterior Atrioventricular Artery  Vessel  is angiographically normal.  Intervention  No interventions have been documented. Coronary Diagrams  Diagnostic Dominance: Right  Intervention  Implants   No implant documentation for this case.  Syngo Images  Show images for CARDIAC CATHETERIZATION  Images on Long Term Storage  Show images for Blease, Capaldi to Procedure Log  Procedure Log    Hemo Data (last day) before discharge   AO Systolic Cath Pressure  AO Diastolic Cath Pressure  AO Mean Cath Pressure  PA Systolic Cath Pressure  PA Diastolic Cath Pressure  PA Mean Cath Pressure  RA Wedge A Wave  RA Wedge V Wave  RV Systolic Cath Pressure  RV Diastolic Cath Pressure  RV End Diastolic  PCW A Wave  PCW V Wave  PCW Mean  AO O2 Sat  PA O2 Sat  AO O2 Sat  Fick C.O.  Fick C.I.   --  --  --  --  --  --  11 mmHg  10 mmHg  --  --  --  15 mmHg  12 mmHg  11 mmHg  --  --  --  7.04 L/min  3.18 L/min/m2   --  --  --  --  --  --  --  --  35 mmHg  6 mmHg  12 mmHg  --  --  --  --  --  --  --  --   --  --  --  31 mmHg  18 mmHg  22 mmHg  --  --  --  --  --  --  --  --  --  --  --  --  --   138  86 mmHg  110 mmHg  --  --  --  --  --  --  --  --  --  --  --  --  --  --  --  --   148  90 mmHg  117 mmHg  --  --  --  --  --  --  --  --  --  --  --  --  --  --  --  --   --  --  --  --  --  --  --  --  --  --  --  --  --  --  --  PA  --  --  --   --  --  --  --  --  --  --  --                          Cardiac TAVR CT  TECHNIQUE: The patient was scanned on a [employer]. A 120 kV retrospective scan was triggered  in the descending thoracic aorta at 111 HU's. Gantry rotation speed was 250 msecs and collimation was .6 mm. No beta blockade or nitro were given. The 3D data set was reconstructed in 5% intervals of the R-R cycle. Systolic and diastolic phases were analyzed on a dedicated work station using MPR, MIP and VRT modes. The patient received 80 cc of contrast.  FINDINGS: Image quality: Excellent. A millisecond reconstruction was performed due to motion artifact.  Noise artifact is: Limited.  Valve Morphology: The aortic valve is severely calcified/thickened with restricted leaflet motion of all cusps.  Aortic Valve area: 1.02 cm2  Aortic Valve Calcium score: 1772  Aortic annular dimension:  Phase assessed: 30%  Annular area: 501 mm2  Annular perimeter: 81.3 mm  Max diameter: 29  mm  Min diameter: 22.5 mm  Annular and subannular calcification: No significant subannular or LVOT calcification.  Optimal coplanar projection: RAO 5 CRA 4  Coronary Artery Height above Annulus:  Left Main: 11.6 mm  Right Coronary: 14.7 mm  Sinus of Valsalva Measurements:  Non-coronary: 32 mm  Right-coronary: 30 mm  Left-coronary: 32 mm  Sinus of Valsalva Height: 19 mm  Sinotubular Junction: 29 mm. Trace calcified plaque.  Ascending Thoracic Aorta: 34 mm. No significant plaque.  Coronary Arteries: Normal coronary origin. Right dominance. The study was performed without use of NTG and is insufficient for plaque evaluation. Refer to recent cardiac cath for coronary assessment.  Cardiac Morphology:  Right Atrium: Right atrial size is within normal limits.  Right Ventricle: The right ventricular cavity is within normal limits.  Left Atrium: Left atrial size is normal in size with no left atrial appendage filling defect.  Left Ventricle: The ventricular cavity size is within normal limits. There are no stigmata of prior infarction. There is no  abnormal filling defect. Normal left ventricular function without regional wall motion abnormalities. LVEF=75%.  Pulmonary arteries: Normal in size without proximal filling defect.  Pulmonary veins: Normal pulmonary venous drainage.  Pericardium: Normal thickness with no significant effusion or calcium present.  Mitral Valve: The mitral valve is normal structure with mild MAC.  Extra-cardiac findings: See attached radiology report for non-cardiac structures.  IMPRESSION: 1. Annular measurements appropriate for 26 mm Edwards Sapien 3. No annular or sub-annular calcifications.  2. Sufficient coronary to annulus distance.  3. Optimum Fluoroscopic Angle for Delivery: RAO 5 CRA 4  4. No thrombus in the left atrial appendage.  Eleonore Chiquito, MD   Electronically Signed By: Eleonore Chiquito On: 04/17/2019 18:13   CT ANGIOGRAPHY CHEST, ABDOMEN AND PELVIS  TECHNIQUE: Multidetector CT imaging through the chest, abdomen and pelvis was performed using the standard protocol during bolus administration of intravenous contrast. Multiplanar reconstructed images and MIPs were obtained and reviewed to evaluate the vascular anatomy.  CONTRAST: 163m OMNIPAQUE IOHEXOL 350 MG/ML SOLN  COMPARISON: None.  FINDINGS: CTA CHEST FINDINGS  Cardiovascular: Heart size is normal. There is no significant pericardial fluid, thickening or pericardial calcification. There is aortic atherosclerosis, as well as atherosclerosis of the great vessels of the mediastinum and the coronary arteries, including calcified atherosclerotic plaque in the left anterior descending coronary artery. Severe thickening and calcification of the aortic valve and moderate calcification of the inferior mitral annulus.  Mediastinum/Lymph Nodes: No pathologically enlarged mediastinal or hilar lymph nodes. Esophagus is unremarkable in appearance. No axillary lymphadenopathy.  Lungs/Pleura: Tiny  pulmonary nodules are noted in the right upper lobe, largest of which measures only 4 mm (axial image 45 of series 16). No acute consolidative airspace disease. No pleural effusions.  Musculoskeletal/Soft Tissues: There are no aggressive appearing lytic or blastic lesions noted in the visualized portions of the skeleton.  CTA ABDOMEN AND PELVIS FINDINGS  Hepatobiliary: No suspicious cystic or solid hepatic lesions. Diffuse low attenuation throughout the hepatic parenchyma, indicative of hepatic steatosis. No intra or extrahepatic biliary ductal dilatation. Gallbladder is normal in appearance.  Pancreas: No pancreatic mass. No pancreatic ductal dilatation. No pancreatic or peripancreatic fluid collections or inflammatory changes.  Spleen: Unremarkable.  Adrenals/Urinary Tract: Bilateral kidneys and adrenal glands are normal in appearance. No hydroureteronephrosis. Urinary bladder is grossly unremarkable in appearance.  Stomach/Bowel: Normal appearance of the stomach. Small periampullary duodenal diverticulum, without surrounding inflammatory changes. No pathologic dilatation of small bowel or colon. Multiple colonic diverticulae are noted,  without surrounding inflammatory changes to suggest an acute diverticulitis at this time. Normal appendix.  Vascular/Lymphatic: Vascular findings and measurements pertinent to potential TAVR procedure, as detailed above. Aortic atherosclerosis, without evidence of aneurysm or dissection in the abdominal or pelvic vasculature. No lymphadenopathy noted in the abdomen or pelvis.  Reproductive: Prostate gland and seminal vesicles are unremarkable in appearance.  Other: No significant volume of ascites. No pneumoperitoneum.  Musculoskeletal: There are no aggressive appearing lytic or blastic lesions noted in the visualized portions of the skeleton.  VASCULAR MEASUREMENTS PERTINENT TO TAVR:  AORTA:  Minimal Aortic  Diameter-17 x 17 mm  Severity of Aortic Calcification-minimal  RIGHT PELVIS:  Right Common Iliac Artery -  Minimal Diameter-11.0 x 10.5 mm  Tortuosity-mild  Calcification-none  Right External Iliac Artery -  Minimal Diameter-8.4 x 8.2 mm  Tortuosity-moderate  Calcification-none  Right Common Femoral Artery -  Minimal Diameter-7.9 x 7.5 mm  Tortuosity-mild  Calcification-none  LEFT PELVIS:  Left Common Iliac Artery -  Minimal Diameter-11.4 x 10.1 mm  Tortuosity-mild  Calcification-minimal  Left External Iliac Artery -  Minimal Diameter-7.8 x 8.5 mm  Tortuosity-moderate  Calcification-none  Left Common Femoral Artery -  Minimal Diameter-8.7 x 8.0 mm  Tortuosity-mild  Calcification-none  Review of the MIP images confirms the above findings.  IMPRESSION: 1. Vascular findings and measurements pertinent to potential TAVR procedure, as detailed above. 2. Severe thickening calcification of the aortic valve, compatible with the reported clinical history of severe aortic stenosis. 3. Aortic atherosclerosis, in addition to left anterior descending coronary artery disease. 4. 4 mm right upper lobe pulmonary nodule, nonspecific, but statistically likely benign. No follow-up needed if patient is low-risk. Non-contrast chest CT can be considered in 12 months if patient is high-risk. This recommendation follows the consensus statement: Guidelines for Management of Incidental Pulmonary Nodules Detected on CT Images: From the Fleischner Society 2017; Radiology 2017; 284:228-243. 5. Additional incidental findings, as above.   Electronically Signed By: Vinnie Langton M.D. On: 04/17/2019 13:58    Impression:  Patient has stage D severe symptomatic aortic stenosis. He describes progressive symptoms of exertional shortness of breath and fatigue consistent with chronic diastolic congestive heart failure, [place] Heart  Association functional class IIb. I have personally reviewed the patient's recent transthoracic echocardiogram, diagnostic cardiac catheterization and CT angiograms. Echocardiogram reveals normal left ventricular systolic function. There is severe aortic stenosis. Acoustic windows are suboptimal but the valve appears to be trileaflet. There is severe thickening with restricted leaflet mobility involving all 3 leaflets. Peak velocity across aortic valve measured greater than 4.2 m/s consistent with severe aortic stenosis. Diagnostic cardiac catheterization is notable for the absence of significant coronary artery disease. I agree the patient would best be treated with aortic valve replacement. Because of the patient's relatively young age and reasonably good life expectancy, he may best be treated with conventional surgery.  He appears to be an acceptable candidate for minimally invasive approach for surgery.    Plan:  The patientwasagain counseled at length regarding treatment alternatives for management of severe aortic stenosis including continued medical therapy versus proceeding with aortic valve replacement in the near future. The natural history of aortic stenosis was reviewed, as was long term prognosis with medical therapy alone. Surgical options were discussed at length including conventional surgical aortic valve replacement through either a full median sternotomy or using minimally invasive techniques. Other alternatives including transcatheter aortic valve replacement, patch enlargement of the aortic root, stentless porcine aortic root replacement, and homograft aortic root  replacement were discussed. Discussion was held comparing the relative risks of mechanical valve replacement with need for lifelong anticoagulation versus use of a bioprosthetic tissue valve and the associated potential for late structural valve deterioration and failure. The patient understands and accepts  all potential associated risks of surgery including but not limited to risk of death, stroke, myocardial infarction, congestive heart failure, respiratory failure, renal failure, pneumonia, bleeding requiring blood transfusion and or reexploration, arrhythmia, heart block or bradycardia requiring permanent pacemaker, aortic dissection or other major vascular complication, pleural effusions or other delayed complications related to continued congestive heart failure, and other late complications related to valve replacement including structural valve deterioration and failure, thrombosis, endocarditis, or paravalvular leak.      Valentina Gu. Roxy Manns, MD 04/27/2019 11:09 AM

## 2019-04-29 ENCOUNTER — Inpatient Hospital Stay (HOSPITAL_COMMUNITY): Payer: BC Managed Care – PPO | Admitting: Emergency Medicine

## 2019-04-29 ENCOUNTER — Other Ambulatory Visit: Payer: Self-pay

## 2019-04-29 ENCOUNTER — Inpatient Hospital Stay (HOSPITAL_COMMUNITY): Payer: BC Managed Care – PPO

## 2019-04-29 ENCOUNTER — Inpatient Hospital Stay (HOSPITAL_COMMUNITY)
Admission: RE | Admit: 2019-04-29 | Discharge: 2019-05-05 | DRG: 220 | Disposition: A | Discharge status: Home / Self Care | Payer: BC Managed Care – PPO | Attending: Thoracic Surgery (Cardiothoracic Vascular Surgery) | Admitting: Thoracic Surgery (Cardiothoracic Vascular Surgery)

## 2019-04-29 ENCOUNTER — Encounter (HOSPITAL_COMMUNITY)
Admission: RE | Disposition: A | Discharge status: Home / Self Care | Payer: Self-pay | Source: Home / Self Care | Attending: Thoracic Surgery (Cardiothoracic Vascular Surgery)

## 2019-04-29 ENCOUNTER — Inpatient Hospital Stay (HOSPITAL_COMMUNITY): Payer: BC Managed Care – PPO | Admitting: Anesthesiology

## 2019-04-29 ENCOUNTER — Encounter (HOSPITAL_COMMUNITY): Payer: Self-pay | Admitting: Thoracic Surgery (Cardiothoracic Vascular Surgery)

## 2019-04-29 DIAGNOSIS — K219 Gastro-esophageal reflux disease without esophagitis: Secondary | ICD-10-CM | POA: Diagnosis present

## 2019-04-29 DIAGNOSIS — E1169 Type 2 diabetes mellitus with other specified complication: Secondary | ICD-10-CM

## 2019-04-29 DIAGNOSIS — J9811 Atelectasis: Secondary | ICD-10-CM

## 2019-04-29 DIAGNOSIS — I10 Essential (primary) hypertension: Secondary | ICD-10-CM | POA: Diagnosis present

## 2019-04-29 DIAGNOSIS — E8770 Fluid overload, unspecified: Secondary | ICD-10-CM | POA: Diagnosis not present

## 2019-04-29 DIAGNOSIS — Z888 Allergy status to other drugs, medicaments and biological substances status: Secondary | ICD-10-CM

## 2019-04-29 DIAGNOSIS — N400 Enlarged prostate without lower urinary tract symptoms: Secondary | ICD-10-CM | POA: Diagnosis present

## 2019-04-29 DIAGNOSIS — D696 Thrombocytopenia, unspecified: Secondary | ICD-10-CM | POA: Diagnosis not present

## 2019-04-29 DIAGNOSIS — Z79899 Other long term (current) drug therapy: Secondary | ICD-10-CM

## 2019-04-29 DIAGNOSIS — E785 Hyperlipidemia, unspecified: Secondary | ICD-10-CM | POA: Diagnosis present

## 2019-04-29 DIAGNOSIS — I371 Nonrheumatic pulmonary valve insufficiency: Secondary | ICD-10-CM | POA: Diagnosis present

## 2019-04-29 DIAGNOSIS — J45909 Unspecified asthma, uncomplicated: Secondary | ICD-10-CM | POA: Diagnosis present

## 2019-04-29 DIAGNOSIS — R011 Cardiac murmur, unspecified: Secondary | ICD-10-CM | POA: Diagnosis present

## 2019-04-29 DIAGNOSIS — Z7984 Long term (current) use of oral hypoglycemic drugs: Secondary | ICD-10-CM

## 2019-04-29 DIAGNOSIS — Z87442 Personal history of urinary calculi: Secondary | ICD-10-CM

## 2019-04-29 DIAGNOSIS — Z6836 Body mass index (BMI) 36.0-36.9, adult: Secondary | ICD-10-CM

## 2019-04-29 DIAGNOSIS — R000 Tachycardia, unspecified: Secondary | ICD-10-CM | POA: Diagnosis present

## 2019-04-29 DIAGNOSIS — Z7951 Long term (current) use of inhaled steroids: Secondary | ICD-10-CM

## 2019-04-29 DIAGNOSIS — E66811 Obesity, class 1: Secondary | ICD-10-CM | POA: Diagnosis present

## 2019-04-29 DIAGNOSIS — Z8249 Family history of ischemic heart disease and other diseases of the circulatory system: Secondary | ICD-10-CM

## 2019-04-29 DIAGNOSIS — I350 Nonrheumatic aortic (valve) stenosis: Principal | ICD-10-CM

## 2019-04-29 DIAGNOSIS — Z8701 Personal history of pneumonia (recurrent): Secondary | ICD-10-CM | POA: Diagnosis not present

## 2019-04-29 DIAGNOSIS — Z885 Allergy status to narcotic agent status: Secondary | ICD-10-CM

## 2019-04-29 DIAGNOSIS — J90 Pleural effusion, not elsewhere classified: Secondary | ICD-10-CM

## 2019-04-29 DIAGNOSIS — K5900 Constipation, unspecified: Secondary | ICD-10-CM | POA: Diagnosis not present

## 2019-04-29 DIAGNOSIS — Z953 Presence of xenogenic heart valve: Secondary | ICD-10-CM

## 2019-04-29 DIAGNOSIS — E119 Type 2 diabetes mellitus without complications: Secondary | ICD-10-CM | POA: Diagnosis present

## 2019-04-29 DIAGNOSIS — D62 Acute posthemorrhagic anemia: Secondary | ICD-10-CM | POA: Diagnosis not present

## 2019-04-29 DIAGNOSIS — K760 Fatty (change of) liver, not elsewhere classified: Secondary | ICD-10-CM | POA: Diagnosis present

## 2019-04-29 DIAGNOSIS — E669 Obesity, unspecified: Secondary | ICD-10-CM | POA: Diagnosis present

## 2019-04-29 HISTORY — PX: AORTIC VALVE REPLACEMENT: SHX41

## 2019-04-29 HISTORY — DX: Presence of xenogenic heart valve: Z95.3

## 2019-04-29 HISTORY — PX: TEE WITHOUT CARDIOVERSION: SHX5443

## 2019-04-29 LAB — CBC
HCT: 35.2 % — ABNORMAL LOW (ref 39.0–52.0)
HCT: 28.2 % — ABNORMAL LOW (ref 39.0–52.0)
Hemoglobin: 11.4 g/dL — ABNORMAL LOW (ref 13.0–17.0)
Hemoglobin: 9.1 g/dL — ABNORMAL LOW (ref 13.0–17.0)
MCH: 31.8 pg (ref 26.0–34.0)
MCH: 31.7 pg (ref 26.0–34.0)
MCHC: 32.4 g/dL (ref 30.0–36.0)
MCHC: 32.3 g/dL (ref 30.0–36.0)
MCV: 98.3 fL (ref 80.0–100.0)
MCV: 98.3 fL (ref 80.0–100.0)
Platelets: 157 10*3/uL (ref 150–400)
Platelets: 131 10*3/uL — ABNORMAL LOW (ref 150–400)
RBC: 3.58 MIL/uL — ABNORMAL LOW (ref 4.22–5.81)
RBC: 2.87 MIL/uL — ABNORMAL LOW (ref 4.22–5.81)
RDW: 12.8 % (ref 11.5–15.5)
RDW: 12.8 % (ref 11.5–15.5)
WBC: 23.4 10*3/uL — ABNORMAL HIGH (ref 4.0–10.5)
WBC: 16.6 10*3/uL — ABNORMAL HIGH (ref 4.0–10.5)
nRBC: 0 % (ref 0.0–0.2)
nRBC: 0 % (ref 0.0–0.2)

## 2019-04-29 LAB — POCT I-STAT 7, (LYTES, BLD GAS, ICA,H+H)
Acid-Base Excess: 2 mmol/L (ref 0.0–2.0)
Acid-base deficit: 1 mmol/L (ref 0.0–2.0)
Acid-base deficit: 2 mmol/L (ref 0.0–2.0)
Acid-base deficit: 3 mmol/L — ABNORMAL HIGH (ref 0.0–2.0)
Acid-base deficit: 3 mmol/L — ABNORMAL HIGH (ref 0.0–2.0)
Bicarbonate: 29 mmol/L — ABNORMAL HIGH (ref 20.0–28.0)
Bicarbonate: 26.8 mmol/L (ref 20.0–28.0)
Bicarbonate: 23 mmol/L (ref 20.0–28.0)
Bicarbonate: 22.3 mmol/L (ref 20.0–28.0)
Bicarbonate: 21.9 mmol/L (ref 20.0–28.0)
Calcium, Ion: 1.24 mmol/L (ref 1.15–1.40)
Calcium, Ion: 1.08 mmol/L — ABNORMAL LOW (ref 1.15–1.40)
Calcium, Ion: 1 mmol/L — ABNORMAL LOW (ref 1.15–1.40)
Calcium, Ion: 1.01 mmol/L — ABNORMAL LOW (ref 1.15–1.40)
Calcium, Ion: 1 mmol/L — ABNORMAL LOW (ref 1.15–1.40)
HCT: 39 % (ref 39.0–52.0)
HCT: 33 % — ABNORMAL LOW (ref 39.0–52.0)
HCT: 32 % — ABNORMAL LOW (ref 39.0–52.0)
HCT: 27 % — ABNORMAL LOW (ref 39.0–52.0)
HCT: 26 % — ABNORMAL LOW (ref 39.0–52.0)
Hemoglobin: 13.3 g/dL (ref 13.0–17.0)
Hemoglobin: 11.2 g/dL — ABNORMAL LOW (ref 13.0–17.0)
Hemoglobin: 10.9 g/dL — ABNORMAL LOW (ref 13.0–17.0)
Hemoglobin: 9.2 g/dL — ABNORMAL LOW (ref 13.0–17.0)
Hemoglobin: 8.8 g/dL — ABNORMAL LOW (ref 13.0–17.0)
O2 Saturation: 100 %
O2 Saturation: 95 %
O2 Saturation: 98 %
O2 Saturation: 96 %
O2 Saturation: 95 %
Patient temperature: 36.8
Potassium: 3.5 mmol/L (ref 3.5–5.1)
Potassium: 4 mmol/L (ref 3.5–5.1)
Potassium: 3.8 mmol/L (ref 3.5–5.1)
Potassium: 3.9 mmol/L (ref 3.5–5.1)
Potassium: 3.7 mmol/L (ref 3.5–5.1)
Sodium: 140 mmol/L (ref 135–145)
Sodium: 141 mmol/L (ref 135–145)
Sodium: 141 mmol/L (ref 135–145)
Sodium: 141 mmol/L (ref 135–145)
Sodium: 141 mmol/L (ref 135–145)
TCO2: 31 mmol/L (ref 22–32)
TCO2: 29 mmol/L (ref 22–32)
TCO2: 24 mmol/L (ref 22–32)
TCO2: 23 mmol/L (ref 22–32)
TCO2: 23 mmol/L (ref 22–32)
pCO2 arterial: 57.5 mmHg — ABNORMAL HIGH (ref 32.0–48.0)
pCO2 arterial: 60.8 mmHg — ABNORMAL HIGH (ref 32.0–48.0)
pCO2 arterial: 39.8 mmHg (ref 32.0–48.0)
pCO2 arterial: 40.3 mmHg (ref 32.0–48.0)
pCO2 arterial: 38.1 mmHg (ref 32.0–48.0)
pH, Arterial: 7.311 — ABNORMAL LOW (ref 7.350–7.450)
pH, Arterial: 7.253 — ABNORMAL LOW (ref 7.350–7.450)
pH, Arterial: 7.368 (ref 7.350–7.450)
pH, Arterial: 7.351 (ref 7.350–7.450)
pH, Arterial: 7.368 (ref 7.350–7.450)
pO2, Arterial: 338 mmHg — ABNORMAL HIGH (ref 83.0–108.0)
pO2, Arterial: 90 mmHg (ref 83.0–108.0)
pO2, Arterial: 108 mmHg (ref 83.0–108.0)
pO2, Arterial: 85 mmHg (ref 83.0–108.0)
pO2, Arterial: 76 mmHg — ABNORMAL LOW (ref 83.0–108.0)

## 2019-04-29 LAB — GLUCOSE, CAPILLARY
Glucose-Capillary: 119 mg/dL — ABNORMAL HIGH (ref 70–99)
Glucose-Capillary: 127 mg/dL — ABNORMAL HIGH (ref 70–99)
Glucose-Capillary: 117 mg/dL — ABNORMAL HIGH (ref 70–99)
Glucose-Capillary: 114 mg/dL — ABNORMAL HIGH (ref 70–99)
Glucose-Capillary: 128 mg/dL — ABNORMAL HIGH (ref 70–99)
Glucose-Capillary: 78 mg/dL (ref 70–99)
Glucose-Capillary: 166 mg/dL — ABNORMAL HIGH (ref 70–99)
Glucose-Capillary: 156 mg/dL — ABNORMAL HIGH (ref 70–99)
Glucose-Capillary: 157 mg/dL — ABNORMAL HIGH (ref 70–99)

## 2019-04-29 LAB — PROTIME-INR
INR: 1.3 — ABNORMAL HIGH (ref 0.8–1.2)
Prothrombin Time: 15.8 seconds — ABNORMAL HIGH (ref 11.4–15.2)

## 2019-04-29 LAB — BASIC METABOLIC PANEL
Anion gap: 10 (ref 5–15)
BUN: 17 mg/dL (ref 8–23)
CO2: 22 mmol/L (ref 22–32)
Calcium: 7 mg/dL — ABNORMAL LOW (ref 8.9–10.3)
Chloride: 110 mmol/L (ref 98–111)
Creatinine, Ser: 0.9 mg/dL (ref 0.61–1.24)
GFR calc Af Amer: >60 mL/min (ref >60)
GFR calc non Af Amer: >60 mL/min (ref >60)
Glucose, Bld: 157 mg/dL — ABNORMAL HIGH (ref 70–99)
Potassium: 3.8 mmol/L (ref 3.5–5.1)
Sodium: 142 mmol/L (ref 135–145)

## 2019-04-29 LAB — HEMOGLOBIN AND HEMATOCRIT, BLOOD
HCT: 29.7 % — ABNORMAL LOW (ref 39.0–52.0)
Hemoglobin: 9.9 g/dL — ABNORMAL LOW (ref 13.0–17.0)

## 2019-04-29 LAB — POCT I-STAT, CHEM 8
BUN: 23 mg/dL (ref 8–23)
Calcium, Ion: 1.19 mmol/L (ref 1.15–1.40)
Chloride: 101 mmol/L (ref 98–111)
Creatinine, Ser: 0.8 mg/dL (ref 0.61–1.24)
Glucose, Bld: 112 mg/dL — ABNORMAL HIGH (ref 70–99)
HCT: 41 % (ref 39.0–52.0)
Hemoglobin: 13.9 g/dL (ref 13.0–17.0)
Potassium: 3.5 mmol/L (ref 3.5–5.1)
Sodium: 140 mmol/L (ref 135–145)
TCO2: 31 mmol/L (ref 22–32)

## 2019-04-29 LAB — APTT: aPTT: 25 seconds (ref 24–36)

## 2019-04-29 LAB — MAGNESIUM: Magnesium: 2.9 mg/dL — ABNORMAL HIGH (ref 1.7–2.4)

## 2019-04-29 LAB — PLATELET COUNT: Platelets: 171 10*3/uL (ref 150–400)

## 2019-04-29 SURGERY — REPLACEMENT, AORTIC VALVE, MINIMALLY INVASIVE
Anesthesia: General | Site: Chest

## 2019-04-29 MED ORDER — 0.9 % SODIUM CHLORIDE (POUR BTL) OPTIME
TOPICAL | Status: DC | PRN
  Administered 2019-04-29: 4000 mL

## 2019-04-29 MED ORDER — DEXTROSE 50 % IV SOLN: 0.0000 mL | INTRAVENOUS | Status: DC | PRN

## 2019-04-29 MED ORDER — FAMOTIDINE IN NACL 20-0.9 MG/50ML-% IV SOLN
INTRAVENOUS | Status: AC
  Filled 2019-04-29: qty 50

## 2019-04-29 MED ORDER — KETAMINE HCL 10 MG/ML IJ SOLN
INTRAMUSCULAR | Status: DC | PRN
  Administered 2019-04-29 (×4): 10 mg via INTRAVENOUS
  Administered 2019-04-29: 50 mg via INTRAVENOUS

## 2019-04-29 MED ORDER — METOPROLOL TARTRATE 5 MG/5ML IV SOLN: 2.5000 mg | INTRAVENOUS | Status: DC | PRN

## 2019-04-29 MED ORDER — ROCURONIUM BROMIDE 10 MG/ML (PF) SYRINGE
PREFILLED_SYRINGE | INTRAVENOUS | Status: DC | PRN
  Administered 2019-04-29: 20 mg via INTRAVENOUS
  Administered 2019-04-29: 100 mg via INTRAVENOUS
  Administered 2019-04-29 (×3): 50 mg via INTRAVENOUS
  Administered 2019-04-29: 80 mg via INTRAVENOUS

## 2019-04-29 MED ORDER — CHLORHEXIDINE GLUCONATE 0.12 % MT SOLN
15.0000 mL | OROMUCOSAL | Status: AC
  Administered 2019-04-29: 15 mL via OROMUCOSAL

## 2019-04-29 MED ORDER — PHENYLEPHRINE HCL-NACL 20-0.9 MG/250ML-% IV SOLN
0.0000 ug/min | INTRAVENOUS | Status: DC
  Administered 2019-04-29: 80 ug/min via INTRAVENOUS
  Administered 2019-04-30: 60 ug/min via INTRAVENOUS
  Administered 2019-04-30 (×2): 100 ug/min via INTRAVENOUS
  Filled 2019-04-29 (×6): qty 250

## 2019-04-29 MED ORDER — ALBUTEROL SULFATE (2.5 MG/3ML) 0.083% IN NEBU
INHALATION_SOLUTION | RESPIRATORY_TRACT | Status: AC
  Filled 2019-04-29: qty 3

## 2019-04-29 MED ORDER — CHLORHEXIDINE GLUCONATE 0.12 % MT SOLN
15.0000 mL | Freq: Once | OROMUCOSAL | Status: AC
  Administered 2019-04-29: 15 mL via OROMUCOSAL
  Filled 2019-04-29: qty 15

## 2019-04-29 MED ORDER — PANTOPRAZOLE SODIUM 40 MG PO TBEC
40.0000 mg | DELAYED_RELEASE_TABLET | Freq: Every day | ORAL | Status: DC
  Administered 2019-04-29 – 2019-05-04 (×6): 40 mg via ORAL
  Filled 2019-04-29 (×6): qty 1

## 2019-04-29 MED ORDER — FINASTERIDE 5 MG PO TABS
5.0000 mg | ORAL_TABLET | Freq: Every day | ORAL | Status: DC
  Administered 2019-04-30 – 2019-05-05 (×6): 5 mg via ORAL
  Filled 2019-04-29 (×6): qty 1

## 2019-04-29 MED ORDER — INSULIN REGULAR(HUMAN) IN NACL 100-0.9 UT/100ML-% IV SOLN
INTRAVENOUS | Status: DC
  Filled 2019-04-29: qty 100

## 2019-04-29 MED ORDER — LACTATED RINGERS IV SOLN: INTRAVENOUS | Status: DC | PRN

## 2019-04-29 MED ORDER — SODIUM CHLORIDE (PF) 0.9 % IJ SOLN
INTRAMUSCULAR | Status: AC
  Filled 2019-04-29: qty 10

## 2019-04-29 MED ORDER — ACETAMINOPHEN 500 MG PO TABS
1000.0000 mg | ORAL_TABLET | Freq: Four times a day (QID) | ORAL | Status: AC
  Administered 2019-04-30 – 2019-05-04 (×19): 1000 mg via ORAL
  Filled 2019-04-29 (×20): qty 2

## 2019-04-29 MED ORDER — PROTAMINE SULFATE 10 MG/ML IV SOLN
INTRAVENOUS | Status: AC
  Filled 2019-04-29: qty 25

## 2019-04-29 MED ORDER — CHLORHEXIDINE GLUCONATE CLOTH 2 % EX PADS
6.0000 | MEDICATED_PAD | Freq: Every day | CUTANEOUS | Status: DC
  Administered 2019-04-30 – 2019-05-03 (×4): 6 via TOPICAL

## 2019-04-29 MED ORDER — ACETAMINOPHEN 10 MG/ML IV SOLN
INTRAVENOUS | Status: AC
  Filled 2019-04-29: qty 100

## 2019-04-29 MED ORDER — SODIUM CHLORIDE 0.9 % IV SOLN: INTRAVENOUS | Status: DC

## 2019-04-29 MED ORDER — SODIUM CHLORIDE 0.9 % IV SOLN
INTRAVENOUS | Status: DC | PRN
  Administered 2019-04-29: 750 mg via INTRAVENOUS

## 2019-04-29 MED ORDER — LIDOCAINE 2% (20 MG/ML) 5 ML SYRINGE
INTRAMUSCULAR | Status: DC | PRN
  Administered 2019-04-29: 80 mg via INTRAVENOUS

## 2019-04-29 MED ORDER — ONDANSETRON HCL 4 MG/2ML IJ SOLN
INTRAMUSCULAR | Status: AC
  Filled 2019-04-29: qty 2

## 2019-04-29 MED ORDER — TRAMADOL HCL 50 MG PO TABS
50.0000 mg | ORAL_TABLET | ORAL | Status: DC | PRN
  Administered 2019-05-04: 05:00:00 100 mg via ORAL
  Administered 2019-05-04 – 2019-05-05 (×2): 50 mg via ORAL
  Filled 2019-04-29 (×3): qty 2
  Filled 2019-04-29: qty 1

## 2019-04-29 MED ORDER — FAMOTIDINE IN NACL 20-0.9 MG/50ML-% IV SOLN
20.0000 mg | Freq: Two times a day (BID) | INTRAVENOUS | Status: DC
  Filled 2019-04-29: qty 50

## 2019-04-29 MED ORDER — DOCUSATE SODIUM 100 MG PO CAPS
200.0000 mg | ORAL_CAPSULE | Freq: Every day | ORAL | Status: DC
  Administered 2019-04-30 – 2019-05-04 (×5): 200 mg via ORAL
  Filled 2019-04-29 (×5): qty 2

## 2019-04-29 MED ORDER — SODIUM CHLORIDE 0.9 % IV SOLN
1.5000 g | Freq: Two times a day (BID) | INTRAVENOUS | Status: AC
  Administered 2019-04-29 – 2019-05-01 (×4): 1.5 g via INTRAVENOUS
  Filled 2019-04-29 (×4): qty 1.5

## 2019-04-29 MED ORDER — ONDANSETRON HCL 4 MG/2ML IJ SOLN
4.0000 mg | Freq: Four times a day (QID) | INTRAMUSCULAR | Status: DC | PRN
  Administered 2019-04-29 – 2019-04-30 (×2): 4 mg via INTRAVENOUS
  Filled 2019-04-29 (×2): qty 2

## 2019-04-29 MED ORDER — VANCOMYCIN HCL IN DEXTROSE 1-5 GM/200ML-% IV SOLN
1000.0000 mg | Freq: Once | INTRAVENOUS | Status: AC
  Administered 2019-04-29: 1000 mg via INTRAVENOUS
  Filled 2019-04-29: qty 200

## 2019-04-29 MED ORDER — FENTANYL CITRATE (PF) 250 MCG/5ML IJ SOLN
INTRAMUSCULAR | Status: AC
  Filled 2019-04-29: qty 25

## 2019-04-29 MED ORDER — METOPROLOL TARTRATE 12.5 MG HALF TABLET
12.5000 mg | ORAL_TABLET | Freq: Once | ORAL | Status: AC
  Administered 2019-04-29: 12.5 mg via ORAL
  Filled 2019-04-29: qty 1

## 2019-04-29 MED ORDER — NITROGLYCERIN IN D5W 200-5 MCG/ML-% IV SOLN: 0.0000 ug/min | INTRAVENOUS | Status: DC

## 2019-04-29 MED ORDER — MILRINONE LACTATE IN DEXTROSE 20-5 MG/100ML-% IV SOLN
0.0000 ug/kg/min | INTRAVENOUS | Status: DC
  Administered 2019-04-29: 0.25 ug/kg/min via INTRAVENOUS
  Filled 2019-04-29 (×2): qty 100

## 2019-04-29 MED ORDER — MAGNESIUM SULFATE 4 GM/100ML IV SOLN
4.0000 g | Freq: Once | INTRAVENOUS | Status: AC
  Administered 2019-04-29: 4 g via INTRAVENOUS

## 2019-04-29 MED ORDER — SODIUM CHLORIDE 0.9% FLUSH
10.0000 mL | Freq: Two times a day (BID) | INTRAVENOUS | Status: DC
  Administered 2019-04-29: 21:00:00 10 mL
  Administered 2019-04-30: 20 mL
  Administered 2019-05-01: 40 mL
  Administered 2019-05-02 – 2019-05-03 (×4): 10 mL

## 2019-04-29 MED ORDER — DEXMEDETOMIDINE HCL IN NACL 400 MCG/100ML IV SOLN
INTRAVENOUS | Status: DC | PRN
  Administered 2019-04-29: .4 ug/kg/h via INTRAVENOUS

## 2019-04-29 MED ORDER — BISACODYL 5 MG PO TBEC
10.0000 mg | DELAYED_RELEASE_TABLET | Freq: Every day | ORAL | Status: DC
  Administered 2019-04-30 – 2019-05-03 (×4): 10 mg via ORAL
  Filled 2019-04-29 (×4): qty 2

## 2019-04-29 MED ORDER — EPHEDRINE SULFATE 50 MG/ML IJ SOLN
INTRAMUSCULAR | Status: DC | PRN
  Administered 2019-04-29 (×3): 10 mg via INTRAVENOUS

## 2019-04-29 MED ORDER — BISACODYL 10 MG RE SUPP: 10.0000 mg | Freq: Every day | RECTAL | Status: DC

## 2019-04-29 MED ORDER — DEXMEDETOMIDINE HCL IN NACL 400 MCG/100ML IV SOLN
0.0000 ug/kg/h | INTRAVENOUS | Status: DC
  Administered 2019-04-30: 0.2 ug/kg/h via INTRAVENOUS
  Filled 2019-04-29: qty 100

## 2019-04-29 MED ORDER — LACTATED RINGERS IV SOLN: INTRAVENOUS | Status: DC

## 2019-04-29 MED ORDER — MIDAZOLAM HCL 5 MG/5ML IJ SOLN
INTRAMUSCULAR | Status: DC | PRN
  Administered 2019-04-29 (×4): 2 mg via INTRAVENOUS

## 2019-04-29 MED ORDER — MIDAZOLAM HCL 2 MG/2ML IJ SOLN
2.0000 mg | INTRAMUSCULAR | Status: DC | PRN
  Filled 2019-04-29: qty 2

## 2019-04-29 MED ORDER — PANTOPRAZOLE SODIUM 40 MG PO TBEC: 40.0000 mg | DELAYED_RELEASE_TABLET | Freq: Every day | ORAL | Status: DC

## 2019-04-29 MED ORDER — MOMETASONE FURO-FORMOTEROL FUM 200-5 MCG/ACT IN AERO
2.0000 | INHALATION_SPRAY | Freq: Two times a day (BID) | RESPIRATORY_TRACT | Status: DC
  Administered 2019-04-30 – 2019-05-05 (×11): 2 via RESPIRATORY_TRACT
  Filled 2019-04-29: qty 8.8

## 2019-04-29 MED ORDER — SODIUM CHLORIDE 0.9% FLUSH: 10.0000 mL | INTRAVENOUS | Status: DC | PRN

## 2019-04-29 MED ORDER — MORPHINE SULFATE (PF) 2 MG/ML IV SOLN
1.0000 mg | INTRAVENOUS | Status: DC | PRN
  Administered 2019-04-29: 2 mg via INTRAVENOUS
  Administered 2019-04-29: 4 mg via INTRAVENOUS
  Administered 2019-04-29: 2 mg via INTRAVENOUS
  Administered 2019-04-30: 4 mg via INTRAVENOUS
  Administered 2019-04-30: 2 mg via INTRAVENOUS
  Administered 2019-04-30 (×3): 4 mg via INTRAVENOUS
  Administered 2019-04-30 (×2): 2 mg via INTRAVENOUS
  Administered 2019-04-30: 4 mg via INTRAVENOUS
  Administered 2019-05-01: 2 mg via INTRAVENOUS
  Administered 2019-05-01: 4 mg via INTRAVENOUS
  Administered 2019-05-01 – 2019-05-03 (×4): 2 mg via INTRAVENOUS
  Filled 2019-04-29 (×2): qty 1
  Filled 2019-04-29: qty 2
  Filled 2019-04-29: qty 1
  Filled 2019-04-29 (×3): qty 2
  Filled 2019-04-29 (×4): qty 1
  Filled 2019-04-29 (×2): qty 2
  Filled 2019-04-29: qty 1
  Filled 2019-04-29 (×2): qty 2
  Filled 2019-04-29: qty 1
  Filled 2019-04-29: qty 2
  Filled 2019-04-29: qty 1

## 2019-04-29 MED ORDER — ARTIFICIAL TEARS OPHTHALMIC OINT
TOPICAL_OINTMENT | OPHTHALMIC | Status: AC
  Filled 2019-04-29: qty 3.5

## 2019-04-29 MED ORDER — PROPOFOL 10 MG/ML IV BOLUS
INTRAVENOUS | Status: DC | PRN
  Administered 2019-04-29: 20 mg via INTRAVENOUS
  Administered 2019-04-29: 70 mg via INTRAVENOUS

## 2019-04-29 MED ORDER — CHLORHEXIDINE GLUCONATE CLOTH 2 % EX PADS: 6.0000 | MEDICATED_PAD | Freq: Every day | CUTANEOUS | Status: DC

## 2019-04-29 MED ORDER — MONTELUKAST SODIUM 10 MG PO TABS
10.0000 mg | ORAL_TABLET | Freq: Every day | ORAL | Status: DC
  Administered 2019-04-29 – 2019-05-04 (×6): 10 mg via ORAL
  Filled 2019-04-29 (×6): qty 1

## 2019-04-29 MED ORDER — HEPARIN SODIUM (PORCINE) 1000 UNIT/ML IJ SOLN
INTRAMUSCULAR | Status: DC | PRN
  Administered 2019-04-29: 38000 [IU] via INTRAVENOUS

## 2019-04-29 MED ORDER — ASPIRIN EC 325 MG PO TBEC
325.0000 mg | DELAYED_RELEASE_TABLET | Freq: Every day | ORAL | Status: DC
  Administered 2019-04-30 – 2019-05-05 (×6): 325 mg via ORAL
  Filled 2019-04-29 (×6): qty 1

## 2019-04-29 MED ORDER — LIDOCAINE 2% (20 MG/ML) 5 ML SYRINGE
INTRAMUSCULAR | Status: AC
  Filled 2019-04-29: qty 5

## 2019-04-29 MED ORDER — DEXAMETHASONE SODIUM PHOSPHATE 10 MG/ML IJ SOLN
INTRAMUSCULAR | Status: AC
  Filled 2019-04-29: qty 1

## 2019-04-29 MED ORDER — PHENYLEPHRINE 40 MCG/ML (10ML) SYRINGE FOR IV PUSH (FOR BLOOD PRESSURE SUPPORT): PREFILLED_SYRINGE | INTRAVENOUS | Status: DC | PRN

## 2019-04-29 MED ORDER — SODIUM CHLORIDE 0.9 % IV SOLN
250.0000 mL | INTRAVENOUS | Status: DC
  Administered 2019-04-30: 250 mL via INTRAVENOUS

## 2019-04-29 MED ORDER — DEXMEDETOMIDINE HCL IN NACL 400 MCG/100ML IV SOLN
0.4000 ug/kg/h | INTRAVENOUS | Status: DC
  Filled 2019-04-29: qty 100

## 2019-04-29 MED ORDER — ALBUMIN HUMAN 5 % IV SOLN
250.0000 mL | INTRAVENOUS | Status: AC | PRN
  Administered 2019-04-29 (×4): 12.5 g via INTRAVENOUS
  Filled 2019-04-29 (×2): qty 250

## 2019-04-29 MED ORDER — PROTAMINE SULFATE 10 MG/ML IV SOLN
INTRAVENOUS | Status: DC | PRN
  Administered 2019-04-29: 380 mg via INTRAVENOUS

## 2019-04-29 MED ORDER — ARTIFICIAL TEARS OPHTHALMIC OINT
TOPICAL_OINTMENT | OPHTHALMIC | Status: DC | PRN
  Administered 2019-04-29: 1 via OPHTHALMIC

## 2019-04-29 MED ORDER — INSULIN REGULAR(HUMAN) IN NACL 100-0.9 UT/100ML-% IV SOLN
INTRAVENOUS | Status: DC | PRN
  Administered 2019-04-29: 3.6 [IU]/h via INTRAVENOUS
  Administered 2019-04-29: 1 [IU]/h via INTRAVENOUS
  Administered 2019-04-30: 1.5 [IU]/h via INTRAVENOUS

## 2019-04-29 MED ORDER — OXYCODONE HCL 5 MG PO TABS
5.0000 mg | ORAL_TABLET | ORAL | Status: DC | PRN
  Administered 2019-04-29 – 2019-04-30 (×2): 5 mg via ORAL
  Administered 2019-04-30 (×2): 10 mg via ORAL
  Administered 2019-04-30 (×3): 5 mg via ORAL
  Administered 2019-05-01: 10 mg via ORAL
  Administered 2019-05-01: 5 mg via ORAL
  Administered 2019-05-01 – 2019-05-04 (×5): 10 mg via ORAL
  Administered 2019-05-05: 5 mg via ORAL
  Filled 2019-04-29 (×2): qty 2
  Filled 2019-04-29: qty 1
  Filled 2019-04-29: qty 2
  Filled 2019-04-29: qty 1
  Filled 2019-04-29: qty 2
  Filled 2019-04-29: qty 1
  Filled 2019-04-29: qty 2
  Filled 2019-04-29: qty 1
  Filled 2019-04-29 (×2): qty 2
  Filled 2019-04-29 (×2): qty 1
  Filled 2019-04-29 (×2): qty 2
  Filled 2019-04-29 (×2): qty 1

## 2019-04-29 MED ORDER — ASPIRIN 81 MG PO CHEW: 324.0000 mg | CHEWABLE_TABLET | Freq: Every day | ORAL | Status: DC

## 2019-04-29 MED ORDER — ACETAMINOPHEN 160 MG/5ML PO SOLN: 1000.0000 mg | Freq: Four times a day (QID) | ORAL | Status: DC

## 2019-04-29 MED ORDER — SODIUM CHLORIDE 0.45 % IV SOLN: INTRAVENOUS | Status: DC | PRN

## 2019-04-29 MED ORDER — MILRINONE LACTATE IN DEXTROSE 20-5 MG/100ML-% IV SOLN
INTRAVENOUS | Status: DC | PRN
  Administered 2019-04-29: .25 ug/kg/min via INTRAVENOUS

## 2019-04-29 MED ORDER — PROPOFOL 10 MG/ML IV BOLUS
INTRAVENOUS | Status: AC
  Filled 2019-04-29: qty 20

## 2019-04-29 MED ORDER — POTASSIUM CHLORIDE 10 MEQ/50ML IV SOLN: 10.0000 meq | INTRAVENOUS | Status: AC

## 2019-04-29 MED ORDER — ACETAMINOPHEN 650 MG RE SUPP
650.0000 mg | Freq: Once | RECTAL | Status: AC
  Administered 2019-04-29: 650 mg via RECTAL

## 2019-04-29 MED ORDER — SODIUM CHLORIDE 0.9% FLUSH
3.0000 mL | Freq: Two times a day (BID) | INTRAVENOUS | Status: DC
  Administered 2019-04-30 – 2019-05-02 (×4): 3 mL via INTRAVENOUS

## 2019-04-29 MED ORDER — ORAL CARE MOUTH RINSE
15.0000 mL | OROMUCOSAL | Status: DC
  Administered 2019-04-29: 15 mL via OROMUCOSAL

## 2019-04-29 MED ORDER — CHLORHEXIDINE GLUCONATE 0.12% ORAL RINSE (MEDLINE KIT)
15.0000 mL | Freq: Two times a day (BID) | OROMUCOSAL | Status: DC
  Administered 2019-04-29 – 2019-05-03 (×8): 15 mL via OROMUCOSAL

## 2019-04-29 MED ORDER — MIDAZOLAM HCL (PF) 10 MG/2ML IJ SOLN
INTRAMUSCULAR | Status: AC
  Filled 2019-04-29: qty 2

## 2019-04-29 MED ORDER — LACTATED RINGERS IV SOLN
500.0000 mL | Freq: Once | INTRAVENOUS | Status: AC | PRN
  Administered 2019-04-29: 500 mL via INTRAVENOUS

## 2019-04-29 MED ORDER — ROCURONIUM BROMIDE 10 MG/ML (PF) SYRINGE
PREFILLED_SYRINGE | INTRAVENOUS | Status: AC
  Filled 2019-04-29: qty 20

## 2019-04-29 MED ORDER — PHENYLEPHRINE HCL-NACL 20-0.9 MG/250ML-% IV SOLN
INTRAVENOUS | Status: DC | PRN
  Administered 2019-04-29: 25 ug/min via INTRAVENOUS

## 2019-04-29 MED ORDER — MAGNESIUM SULFATE 4 GM/100ML IV SOLN
INTRAVENOUS | Status: AC
  Filled 2019-04-29: qty 100

## 2019-04-29 MED ORDER — ACETAMINOPHEN 160 MG/5ML PO SOLN: 650.0000 mg | Freq: Once | ORAL | Status: AC

## 2019-04-29 MED ORDER — FENTANYL CITRATE (PF) 100 MCG/2ML IJ SOLN
INTRAMUSCULAR | Status: DC | PRN
  Administered 2019-04-29: 100 ug via INTRAVENOUS
  Administered 2019-04-29: 50 ug via INTRAVENOUS
  Administered 2019-04-29: 100 ug via INTRAVENOUS
  Administered 2019-04-29: 50 ug via INTRAVENOUS
  Administered 2019-04-29: 200 ug via INTRAVENOUS

## 2019-04-29 MED ORDER — PHENYLEPHRINE HCL (PRESSORS) 10 MG/ML IV SOLN
INTRAVENOUS | Status: DC | PRN
  Administered 2019-04-29 (×2): 80 ug via INTRAVENOUS
  Administered 2019-04-29: 120 ug via INTRAVENOUS

## 2019-04-29 MED ORDER — ROCURONIUM BROMIDE 10 MG/ML (PF) SYRINGE
PREFILLED_SYRINGE | INTRAVENOUS | Status: AC
  Filled 2019-04-29: qty 10

## 2019-04-29 MED ORDER — TRANEXAMIC ACID-NACL 1000-0.7 MG/100ML-% IV SOLN
1000.0000 mg | INTRAVENOUS | Status: DC
  Filled 2019-04-29: qty 100

## 2019-04-29 MED ORDER — ALBUMIN HUMAN 5 % IV SOLN: INTRAVENOUS | Status: DC | PRN

## 2019-04-29 MED ORDER — CHLORHEXIDINE GLUCONATE 4 % EX LIQD: 30.0000 mL | CUTANEOUS | Status: DC

## 2019-04-29 MED ORDER — SODIUM CHLORIDE 0.9% FLUSH: 3.0000 mL | INTRAVENOUS | Status: DC | PRN

## 2019-04-29 MED ORDER — KETAMINE HCL 50 MG/5ML IJ SOSY
PREFILLED_SYRINGE | INTRAMUSCULAR | Status: AC
  Filled 2019-04-29: qty 10

## 2019-04-29 MED ORDER — TRANEXAMIC ACID 1000 MG/10ML IV SOLN
INTRAVENOUS | Status: DC | PRN
  Administered 2019-04-29: 1.5 mg/kg/h via INTRAVENOUS

## 2019-04-29 SURGICAL SUPPLY — 119 items
ADAPTER CARDIO PERF ANTE/RETRO (ADAPTER) ×3 IMPLANT
ADH SKN CLS APL DERMABOND .7 (GAUZE/BANDAGES/DRESSINGS) ×4
ADH SKN CLS LQ APL DERMABOND (GAUZE/BANDAGES/DRESSINGS) ×4
ADPR PRFSN 84XANTGRD RTRGD (ADAPTER) ×2
ADPR TBG 2 MALE LL ART (MISCELLANEOUS) ×2
BAG DECANTER FOR FLEXI CONT (MISCELLANEOUS) ×3 IMPLANT
BATTERY MAXDRIVER (MISCELLANEOUS) ×1 IMPLANT
BLADE CLIPPER SURG (BLADE) ×3 IMPLANT
CANISTER SUCT 3000ML PPV (MISCELLANEOUS) ×3 IMPLANT
CANNULA FEM VENOUS REMOTE 22FR (CANNULA) ×1 IMPLANT
CANNULA GUNDRY RCSP 15FR (MISCELLANEOUS) ×3 IMPLANT
CANNULA OPTISITE PERFUSION 16F (CANNULA) IMPLANT
CANNULA OPTISITE PERFUSION 18F (CANNULA) ×1 IMPLANT
CANNULA SUMP PERICARDIAL (CANNULA) ×3 IMPLANT
CATH CPB KIT OWEN (MISCELLANEOUS) IMPLANT
CATH ENDOVENT PULMONARY (CATHETERS) IMPLANT
CATH HEART VENT LEFT (CATHETERS) ×2 IMPLANT
CELLS DAT CNTRL 66122 CELL SVR (MISCELLANEOUS) ×2 IMPLANT
CNTNR URN SCR LID CUP LEK RST (MISCELLANEOUS) ×2 IMPLANT
CONN ST 1/4X3/8  BEN (MISCELLANEOUS) ×6
CONN ST 1/4X3/8 BEN (MISCELLANEOUS) ×4 IMPLANT
CONNECTOR 1/2X3/8X1/2 3 WAY (MISCELLANEOUS) ×3
CONNECTOR 1/2X3/8X1/2 3WAY (MISCELLANEOUS) ×2 IMPLANT
CONT SPEC 4OZ STRL OR WHT (MISCELLANEOUS) ×3
COVER BACK TABLE 24X17X13 BIG (DRAPES) ×3 IMPLANT
COVER MAYO STAND STRL (DRAPES) ×1 IMPLANT
COVER PROBE W GEL 5X96 (DRAPES) ×3 IMPLANT
DERMABOND ADHESIVE PROPEN (GAUZE/BANDAGES/DRESSINGS) ×2
DERMABOND ADVANCED (GAUZE/BANDAGES/DRESSINGS) ×2
DERMABOND ADVANCED .7 DNX12 (GAUZE/BANDAGES/DRESSINGS) ×4 IMPLANT
DERMABOND ADVANCED .7 DNX6 (GAUZE/BANDAGES/DRESSINGS) IMPLANT
DEVICE CLOSURE PERCLS PRGLD 6F (VASCULAR PRODUCTS) IMPLANT
DEVICE SUT CK QUICK LOAD MINI (Prosthesis & Implant Heart) ×1 IMPLANT
DEVICE TROCAR PUNCTURE CLOSURE (ENDOMECHANICALS) ×3 IMPLANT
DRAIN CHANNEL 32F RND 10.7 FF (WOUND CARE) ×6 IMPLANT
DRAPE CV SPLIT W-CLR ANES SCRN (DRAPES) ×3 IMPLANT
DRAPE INCISE IOBAN 66X45 STRL (DRAPES) ×10 IMPLANT
DRAPE PERI GROIN 82X75IN TIB (DRAPES) ×3 IMPLANT
DRAPE SLUSH/WARMER DISC (DRAPES) ×3 IMPLANT
DRSG AQUACEL AG ADV 3.5X10 (GAUZE/BANDAGES/DRESSINGS) ×1 IMPLANT
ELECT BLADE 4.0 EZ CLEAN MEGAD (MISCELLANEOUS) ×3
ELECT BLADE 6.5 EXT (BLADE) ×4 IMPLANT
ELECT REM PT RETURN 9FT ADLT (ELECTROSURGICAL) ×6
ELECTRODE BLDE 4.0 EZ CLN MEGD (MISCELLANEOUS) ×2 IMPLANT
ELECTRODE REM PT RTRN 9FT ADLT (ELECTROSURGICAL) ×4 IMPLANT
FELT TEFLON 1X6 (MISCELLANEOUS) ×5 IMPLANT
FEMORAL VENOUS CANN RAP (CANNULA) IMPLANT
GAUZE SPONGE 4X4 12PLY STRL (GAUZE/BANDAGES/DRESSINGS) ×3 IMPLANT
GLOVE BIO SURGEON STRL SZ 6.5 (GLOVE) ×6 IMPLANT
GLOVE BIOGEL PI IND STRL 7.5 (GLOVE) IMPLANT
GLOVE BIOGEL PI INDICATOR 7.5 (GLOVE) ×2
GLOVE INDICATOR 7.0 STRL GRN (GLOVE) ×1 IMPLANT
GLOVE ORTHO TXT STRL SZ7.5 (GLOVE) ×9 IMPLANT
GOWN STRL REUS W/ TWL LRG LVL3 (GOWN DISPOSABLE) ×8 IMPLANT
GOWN STRL REUS W/TWL LRG LVL3 (GOWN DISPOSABLE) ×24
GRASPER SUT TROCAR 14GX15 (MISCELLANEOUS) ×3 IMPLANT
IV ADAPTER SYR DOUBLE MALE LL (MISCELLANEOUS) ×1 IMPLANT
KIT BASIN OR (CUSTOM PROCEDURE TRAY) ×3 IMPLANT
KIT CATH SUCT 8FR (CATHETERS) ×2 IMPLANT
KIT DILATOR VASC 18G NDL (KITS) ×3 IMPLANT
KIT DRAINAGE VACCUM ASSIST (KITS) ×1 IMPLANT
KIT SUCTION CATH 14FR (SUCTIONS) ×3 IMPLANT
KIT SUT CK MINI COMBO 4X17 (Prosthesis & Implant Heart) ×1 IMPLANT
KIT TURNOVER KIT B (KITS) ×3 IMPLANT
LEAD PACING MYOCARDI (MISCELLANEOUS) ×3 IMPLANT
LINE VENT (MISCELLANEOUS) ×1 IMPLANT
NDL AORTIC ROOT 14G 7F (CATHETERS) ×2 IMPLANT
NDL SUT 1 .5 CRC FRENCH EYE (NEEDLE) IMPLANT
NEEDLE AORTIC ROOT 14G 7F (CATHETERS) ×3 IMPLANT
NEEDLE FRENCH EYE (NEEDLE) ×3
NS IRRIG 1000ML POUR BTL (IV SOLUTION) ×14 IMPLANT
PACK OPEN HEART (CUSTOM PROCEDURE TRAY) ×3 IMPLANT
PAD ARMBOARD 7.5X6 YLW CONV (MISCELLANEOUS) ×6 IMPLANT
PAD ELECT DEFIB RADIOL ZOLL (MISCELLANEOUS) ×3 IMPLANT
PERCLOSE PROGLIDE 6F (VASCULAR PRODUCTS) ×12
PLATE RIB X SHAPE 10HOLE (Plate) ×1 IMPLANT
PLATE RIB X SHAPE 20HOLE (Plate) ×1 IMPLANT
POSITIONER HEAD DONUT 9IN (MISCELLANEOUS) ×3 IMPLANT
RETRACTOR WND ALEXIS 18 MED (MISCELLANEOUS) ×2 IMPLANT
RTRCTR WOUND ALEXIS 18CM MED (MISCELLANEOUS) ×3
SCREW LOCKING TI 2.3X13MM (Screw) ×10 IMPLANT
SCREW STERNAL 2.3X17MM (Screw) ×7 IMPLANT
SCREW STERNAL LOCK 2.3MM (Screw) ×5 IMPLANT
SET CANNULATION TOURNIQUET (MISCELLANEOUS) ×3 IMPLANT
SET CARDIOPLEGIA MPS 5001102 (MISCELLANEOUS) ×1 IMPLANT
SET IRRIG TUBING LAPAROSCOPIC (IRRIGATION / IRRIGATOR) ×3 IMPLANT
SET MICROPUNCTURE 5F STIFF (MISCELLANEOUS) ×1 IMPLANT
SHEATH PINNACLE 8F 10CM (SHEATH) ×3 IMPLANT
SOL ANTI FOG 6CC (MISCELLANEOUS) ×2 IMPLANT
SOLUTION ANTI FOG 6CC (MISCELLANEOUS) ×1
STOPCOCK 4 WAY LG BORE MALE ST (IV SETS) ×1 IMPLANT
SUT BONE WAX W31G (SUTURE) ×3 IMPLANT
SUT ETHIBON 2 0 V 52N 30 (SUTURE) ×2 IMPLANT
SUT ETHIBOND X763 2 0 SH 1 (SUTURE) ×1 IMPLANT
SUT GORETEX CV 4 TH 22 36 (SUTURE) ×2 IMPLANT
SUT GORETEX CV4 TH-18 (SUTURE) ×4 IMPLANT
SUT PROLENE 3 0 SH1 36 (SUTURE) ×4 IMPLANT
SUT PROLENE 4 0 RB 1 (SUTURE) ×21
SUT PROLENE 4-0 RB1 .5 CRCL 36 (SUTURE) IMPLANT
SUT PROLENE 5 0 C 1 36 (SUTURE) ×1 IMPLANT
SUT PROLENE 6 0 C 1 30 (SUTURE) ×1 IMPLANT
SUT SILK  1 MH (SUTURE) ×3
SUT SILK 1 MH (SUTURE) IMPLANT
SUT SILK 2 0SH CR/8 30 (SUTURE) ×1 IMPLANT
SUT TEM PAC WIRE 2 0 SH (SUTURE) ×6 IMPLANT
SYSTEM SAHARA CHEST DRAIN ATS (WOUND CARE) ×3 IMPLANT
TAPE CLOTH SURG 4X10 WHT LF (GAUZE/BANDAGES/DRESSINGS) ×1 IMPLANT
TAPE PAPER 2X10 WHT MICROPORE (GAUZE/BANDAGES/DRESSINGS) ×1 IMPLANT
TOWEL GREEN STERILE (TOWEL DISPOSABLE) ×3 IMPLANT
TOWEL GREEN STERILE FF (TOWEL DISPOSABLE) ×3 IMPLANT
TRAY FOLEY SLVR 16FR TEMP STAT (SET/KITS/TRAYS/PACK) ×3 IMPLANT
TROCAR XCEL BLADELESS 5X75MML (TROCAR) ×1 IMPLANT
TROCAR XCEL NON-BLD 11X100MML (ENDOMECHANICALS) ×6 IMPLANT
TUBE SUCT INTRACARD DLP 20F (MISCELLANEOUS) ×4 IMPLANT
UNDERPAD 30X30 (UNDERPADS AND DIAPERS) ×3 IMPLANT
VALVE AORTIC SZ23 INSP/RESIL (Prosthesis & Implant Heart) ×1 IMPLANT
VENT LEFT HEART 12002 (CATHETERS) ×3
WATER STERILE IRR 1000ML POUR (IV SOLUTION) ×6 IMPLANT
WIRE EMERALD 3MM-J .035X150CM (WIRE) ×1 IMPLANT

## 2019-04-29 NOTE — Anesthesia Procedure Notes (Signed)
Central Venous Catheter Insertion Performed by: Oleta Mouse, MD, anesthesiologist Start/End3/17/2021 7:46 AM, 04/29/2019 7:55 AM Patient location: Pre-op. Preanesthetic checklist: patient identified, IV checked, site marked, risks and benefits discussed, surgical consent, monitors and equipment checked, pre-op evaluation, timeout performed and anesthesia consent Hand hygiene performed  and maximum sterile barriers used  PA cath was placed.Swan type:thermodilution Procedure performed without using ultrasound guided technique. Attempts: 1 Patient tolerated the procedure well with no immediate complications.

## 2019-04-29 NOTE — Op Note (Signed)
CARDIOTHORACIC SURGERY OPERATIVE NOTE  Date of Procedure:  04/29/2019  Preoperative Diagnosis: Severe Aortic Stenosis   Postoperative Diagnosis: Same   Procedure:    Minimally Invasive Aortic Valve Replacement Right Anterior Mini-thoracotomy Edwards Inspiris Resilia Stented Bovine Pericardial Tissue Valve (size 46mm, catalog #[id number], serial [id number]) Plating of right 2nd and 3rd costal cartilages (KLS titanium plates)   Surgeon: Valentina Gu. Roxy Manns, MD  Assistant: Nani Skillern, PA-C  Anesthesia: Laurie Panda, MD  Operative Findings:  Severe calcific aortic stenosis  Normal left ventricular systolic function  Moderate left ventricular hypertrophy with diastolic dysfunction          BRIEF CLINICAL NOTE AND INDICATIONS FOR SURGERY  Patient is a 64 year old obese white male with history of aortic stenosis, hypertension, hyperlipidemia, insulin-dependent type 2 diabetes mellitus, asthma, and GE reflux disease who has been referred for surgical consultation to discuss treatment options for management of severe symptomatic aortic stenosis.  Patient has reported history of scarlet fever during childhood and has been told that he has a heart murmur all of his life. More recently he has been diagnosed with aortic stenosis for which she has been followed with serial echocardiograms. Echocardiogram performed in 2019 revealed normal left ventricular systolic function with what was reported to be moderate aortic stenosis.Peak velocity across aortic valve was measured 4.1 m/s corresponding to mean transvalvular gradient estimated 31 mmHg and aortic valve area calculated 1.1 cm with DVI measured 0.5 at that time. He was seen in follow-up recently by his primary care physician and reported symptoms of exertional shortness of breath. He was referred for cardiology consultation and has been evaluated previously by Dr. Garen Lah.Follow-up echocardiogram performed January 29,  2021revealed severe aortic stenosis. Peak velocity across aortic valve measured 4.2 m/s corresponding to mean transvalvular gradient estimated 35 mmHg and aortic valve area calculated 0.95 cm with DVI measured 0.28. Diagnostic cardiac catheterization was performed April 06, 2019 and revealed normal coronary artery anatomy with no significant coronary artery disease. Right heart pressures were mildly elevated. Attempts to cross the aortic valve were unsuccessful due to severe calcification. Cardiothoracic surgical consultation was requested.  The patient has been seen in consultation and counseled at length regarding the indications, risks and potential benefits of surgery.  All questions have been answered, and the patient provides full informed consent for the operation as described.    DETAILS OF THE OPERATIVE PROCEDURE  Preparation:  The patient is brought to the operating room on the above mentioned date and central monitoring was established by the anesthesia team including placement of Swan-Ganz catheter through the left internal jugular vein.  A radial arterial line is placed. The patient is placed in the supine position on the operating table.  Intravenous antibiotics are administered. General endotracheal anesthesia is induced uneventfully. The patient is initially intubated using a dual lumen endotracheal tube.  A Foley catheter is placed.  Baseline transesophageal echocardiogram was performed.  Findings were notable for severe calcific aortic stenosis.  The aortic valve is trileaflet.  There is severe thickening, calcification, and restricted leaflet mobility involving all 3 leaflets of the aortic valve.  There is trace aortic insufficiency.  There is normal left ventricular systolic function.  There is moderate left ventricular hypertrophy with significant diastolic dysfunction.  The patient is placed in the supine position with their neck gently extended and turned to the left.    The patient's right neck, chest, abdomen, both groins, and both lower extremities are prepared and draped in a sterile manner. A time  out procedure is performed.   Percutaneous Vascular Access:  Percutaneous arterial and venous access were obtained on the right side.  Using ultrasound guidance the right common femoral vein was cannulated using the Seldinger technique and a pair of Perclose vascular closure devises were placed at opposing 30 degree angles, after which time an 8 French sheath inserted.  The right common femoral artery was cannulated using a micropuncture wire and sheath.  A pair of Perclose vascular closure devices were placed at opposing 30 degree angles in the femoral artery, and a 8 French sheath inserted.  The right internal jugular vein was cannulated  using ultrasound guidance and an 8 French sheath inserted.     Surgical Approach:  A right miniature anteriorthoracotomy incision is performed. The incision is placed immediately over the 3rd intercostal space.  The muscle fibers of the pectoralis major muscle are split longitudinally and the right pleural space is entered through the 3rd intercostal space. The 3rd costal cartilage is divided at it's insertion onto the sternum.  The right internal mammary artery and veins are ligated and divided.  Exposure is felt to be suboptimal, and secondarily the second costal cartilage is divided at its insertion onto the sternum and the surgical working incision is moved to the second intercostal space.  A soft tissue retractor is placed.  Two 11 mm ports are placed through separate stab incisions inferiorly. The right pleural space is insufflated continuously with carbon dioxide gas through the posterior port during the remainder of the operation.     Extracorporeal Cardiopulmonary Bypass and Myocardial Protection:  The patient was heparinized systemically.  The right common femoral vein is cannulated through the venous sheath and a guidewire  advanced into the right atrium using TEE guidance.  The femoral vein cannulated using a 22 Fr long femoral venous cannula.  The right common femoral artery is cannulated through the arterial sheath and a guidewire advanced into the descending thoracic aorta using TEE guidance.  Femoral artery is cannulated with a 18 French femoral arterial cannula.  The right internal jugular vein is cannulated through the venous sheath and a guidewire advanced into the right atrium.  The internal jugular vein is cannulated using a 14 French pediatric femoral venous cannula.   Adequate heparinization is verified.    The entire pre-bypass portion of the operation was notable for stable hemodynamics.  Cardiopulmonary bypass was begun.  Vacuum assist venous drainage is utilized. An incision is made in the pericardium over the ascending aorta and extended in both directions. Silk traction sutures are placed to retract the pericardium.  Venous drainage and exposure are notably excellent. A retrograde cardioplegia cannula is placed through the right atrium into the coronary sinus using transesophageal echocardiogram guidance.  A left ventricular vent is placed through the right superior pulmonary vein.  An antegrade cardioplegia cannula is placed in the ascending aorta.    The patient is cooled to 32C systemic temperature.  The aortic cross clamp is applied and cardioplegia is delivered initially in an antegrade fashion through the aortic root using modified [place] cold blood cardioplegia ([place] blood cardioplegia protocol).   The majority of the arresting dose is administered through the aortic root and the final 300 mL is administered retrograde through the coronary sinus catheter.  The initial cardioplegic arrest is rapid with early diastolic arrest.   Myocardial protection was felt to be excellent.   Aortic Valve Replacement:  An oblique transverse aortotomy incision was performed.  The aortic valve was  inspected  and notable for severe calcific severe aortic stenosis.  The aortic valve leaflets were excised sharply and the aortic annulus decalcified.  Decalcification was notably straightforward.  The aortic annulus was sized to accept a 23 mm prosthesis.  The aortic root and left ventricle were irrigated with copious cold saline solution.  Aortic valve replacement was performed using interrupted horizontal mattress 2-0 Ethibond pledgeted sutures with pledgets in the subannular position.  An Edwards Inspiris Resilia stented bovine pericardial tissue valve (size 23 mm, model # 11500A, serial # [id number]) was implanted uneventfully. The valve seated appropriately with adequate space beneath the left main and right coronary artery.  All sutures were secured using a Cor-knot device.   Procedure Completion:  The aortotomy was closed using a 2-layer closure of running 4-0 Prolene suture.  One final dose of warm retrograde "reanimation dose" cardioplegia was administered retrograde through the coronary sinus catheter while all air was evacuated through the aortic root.  The aortic cross clamp was removed after a total cross clamp time of 96 minutes.  Epicardial pacing wires are fixed to the right ventricular outflow tract and to the right atrial appendage. The patient is rewarmed to 37C temperature.  The pericardial sac was drained using a 32 French Bard drain placed through the anterior port incision.   The patient is weaned and disconnected from cardiopulmonary bypass.  The patient's rhythm at separation from bypass was sinus.  The patient was weaned from bypass without any inotropic support.  Low-dose milrinone infusion was initiated after separation from bypass because of sluggish right ventricular function.  Total cardiopulmonary bypass time for the operation was 184 minutes.  Followup transesophageal echocardiogram performed after separation from bypass revealed a well-seated bioprosthetic tissue valve in the aortic  position with no perivalvular leak.  Left ventricular function was unchanged from preoperatively.    The femoral arterial and venous cannulae were removed uneventfully and Perclose sutures secured.  The right internal jugular venous cannula is removed and manual pressure held for 30 minutes.  Single lung ventilation was begun. The aortotomy closure was inspected for hemostasis. The right pleural space is irrigated with saline solution and inspected for hemostasis. The right pleural space was drained using a 32 French Bard drain placed through the posterior port incision. The 2nd and 3rd costal cartilages are replaced using a small KLS titanium plates.  The plate for the second costal cartilage was longer to cover the origin of the cartilage from the end of the second rib.  The miniature thoracotomy incision was closed in multiple layers in routine fashion. The right groin was inspected for hemostasis.   The post-bypass portion of the operation was notable for stable rhythm and hemodynamics.   No blood products were administered during the operation.   Disposition:  The patient tolerated the procedure well.  The patient was reintubated using a single lumen endotracheal tube and subsequently transported to the surgical intensive care unit in stable condition. There were no intraoperative complications. All sponge instrument and needle counts are verified correct at completion of the operation.     Valentina Gu. Roxy Manns MD 04/29/2019 3:22 PM

## 2019-04-29 NOTE — Progress Notes (Signed)
Patient ID: Erik Allen, male   DOB: [birth year], 64 y.o.   MRN: [id number]  TCTS Evening Rounds:   Hemodynamically stable on milrinone 0.25, neo 80 CI = 2.2  Still on vent, ready for extubation. Awake and alert.  Urine output good  CT output low  CBC    Component Value Date/Time   WBC 23.4 (H) 04/29/2019 1630   RBC 3.58 (L) 04/29/2019 1630   HGB 10.9 (L) 04/29/2019 1810   HCT 32.0 (L) 04/29/2019 1810   PLT 157 04/29/2019 1630   MCV 98.3 04/29/2019 1630   MCH 31.8 04/29/2019 1630   MCHC 32.4 04/29/2019 1630   RDW 12.8 04/29/2019 1630   LYMPHSABS 3.1 03/30/2019 1647   MONOABS 0.8 03/30/2019 1647   EOSABS 0.1 03/30/2019 1647   BASOSABS 0.1 03/30/2019 1647     BMET    Component Value Date/Time   NA 141 04/29/2019 1810   K 3.8 04/29/2019 1810   CL 101 04/29/2019 0905   CO2 23 04/27/2019 0929   GLUCOSE 112 (H) 04/29/2019 0905   BUN 23 04/29/2019 0905   CREATININE 0.80 04/29/2019 0905   CALCIUM 9.4 04/27/2019 0929   GFRNONAA >60 04/27/2019 0929   GFRAA >60 04/27/2019 0929     A/P:  Stable postop course. Continue current plans

## 2019-04-29 NOTE — Interval H&P Note (Signed)
History and Physical Interval Note:  04/29/2019 6:26 AM  Erik Allen  has presented today for surgery, with the diagnosis of AS.  The various methods of treatment have been discussed with the patient and family. After consideration of risks, benefits and other options for treatment, the patient has consented to  Procedure(s): MINIMALLY INVASIVE AORTIC VALVE REPLACEMENT (AVR) (N/A) TRANSESOPHAGEAL ECHOCARDIOGRAM (TEE) (N/A) as a surgical intervention.  The patient's history has been reviewed, patient examined, no change in status, stable for surgery.  I have reviewed the patient's chart and labs.  Questions were answered to the patient's satisfaction.     Rexene Alberts

## 2019-04-29 NOTE — Anesthesia Procedure Notes (Signed)
Procedure Name: Intubation Performed by: Kathryne Hitch, CRNA Pre-anesthesia Checklist: Patient identified, Emergency Drugs available, Suction available and Patient being monitored Patient Re-evaluated:Patient Re-evaluated prior to induction Oxygen Delivery Method: Circle system utilized Preoxygenation: Pre-oxygenation with 100% oxygen Induction Type: IV induction Ventilation: Mask ventilation without difficulty Laryngoscope Size: Mac and 4 Tube type: Oral Tube size: 8.0 mm Number of attempts: 1 Airway Equipment and Method: Stylet and Oral airway Placement Confirmation: ETT inserted through vocal cords under direct vision,  positive ETCO2 and breath sounds checked- equal and bilateral Secured at: 22 cm Tube secured with: Tape Dental Injury: Teeth and Oropharynx as per pre-operative assessment

## 2019-04-29 NOTE — Anesthesia Procedure Notes (Signed)
Procedure Name: Intubation Date/Time: 04/29/2019 9:36 AM Performed by: Kathryne Hitch, CRNA Pre-anesthesia Checklist: Patient identified, Emergency Drugs available, Suction available and Patient being monitored Patient Re-evaluated:Patient Re-evaluated prior to induction Oxygen Delivery Method: Circle system utilized Preoxygenation: Pre-oxygenation with 100% oxygen Induction Type: IV induction Ventilation: Two handed mask ventilation required Laryngoscope Size: Mac and 4 Grade View: Grade I Tube type: Oral Endobronchial tube: Left, Double lumen EBT and EBT position confirmed by auscultation and 39 Fr Number of attempts: 1 Airway Equipment and Method: Stylet and Oral airway Placement Confirmation: ETT inserted through vocal cords under direct vision,  positive ETCO2 and breath sounds checked- equal and bilateral Secured at: 29 cm Tube secured with: Tape Dental Injury: Teeth and Oropharynx as per pre-operative assessment

## 2019-04-29 NOTE — Transfer of Care (Signed)
Immediate Anesthesia Transfer of Care Note  Patient: Erik Allen  Procedure(s) Performed: MINIMALLY INVASIVE AORTIC VALVE REPLACEMENT (AVR) USING INSPIRIS VALVE SIZE 23MM (N/A Chest) TRANSESOPHAGEAL ECHOCARDIOGRAM (TEE) (N/A )  Patient Location: ICU  Anesthesia Type:General  Level of Consciousness: Patient remains intubated per anesthesia plan  Airway & Oxygen Therapy: Patient remains intubated per anesthesia plan and Patient placed on Ventilator (see vital sign flow sheet for setting)  Post-op Assessment: Report given to RN and Post -op Vital signs reviewed and stable  Post vital signs: Reviewed and stable  Last Vitals:  Vitals Value Taken Time  BP 113/74 04/29/19 1615  Temp    Pulse 95 04/29/19 1617  Resp 14 04/29/19 1617  SpO2 98 % 04/29/19 1617  Vitals shown include unvalidated device data.  Last Pain:  Vitals:   04/29/19 0701  PainSc: 0-No pain      Patients Stated Pain Goal: 2 ([id number])  Complications: No apparent anesthesia complications

## 2019-04-29 NOTE — Progress Notes (Signed)
RT note- re peat ABG improved, fio2 and PEEP decreased back to 50% and +5.

## 2019-04-29 NOTE — Progress Notes (Signed)
Pt extubated per rapid wean protocol. Pt tolerated extubation well. Lungs rhonchi and diminished. Pt encouraged to splint with coughing, reaches up to 500 on incentive with encouragement, also encouraged to take slower breaths. Pt is on 4L [place]. Pt able to speak. Call bell is within reach and bed is in lowest position.

## 2019-04-29 NOTE — Brief Op Note (Signed)
04/29/2019  1:02 PM  PATIENT:  Erik Allen  64 y.o. male  PRE-OPERATIVE DIAGNOSIS:  Severe Aortic Stenosis  POST-OPERATIVE DIAGNOSIS:  Severe Aortic Stenosis  PROCEDURE:  TRANSESOPHAGEAL ECHOCARDIOGRAM (TEE), MINIMALLY INVASIVE AORTIC VALVE REPLACEMENT (AVR) (Model # 11500A, Serial # [id number], USING INSPIRIS VALVE SIZE 23MM)  SURGEON:  Surgeon(s) and Role:    Rexene Alberts, MD - Primary  PHYSICIAN ASSISTANT: Lars Pinks PA-C  ASSISTANTS: Ara Kussmaul RNFA  ANESTHESIA:   general  EBL: Per anesthesia and perfusion record  DRAINS: Chest tubes placed in the mediastinal and pleural spaces   SPECIMEN:  Source of Specimen:  Native aortic valve leaflets  DISPOSITION OF SPECIMEN:  PATHOLOGY  COUNTS CORRECT:  YES  DICTATION: .Dragon Dictation  PLAN OF CARE: Admit to inpatient   PATIENT DISPOSITION:  ICU - intubated and hemodynamically stable.   Delay start of Pharmacological VTE agent (>24hrs) due to surgical blood loss or risk of bleeding: yes  BASELINE WEIGHT: 106.6 kg

## 2019-04-29 NOTE — Anesthesia Procedure Notes (Signed)
Arterial Line Insertion Start/End3/17/2021 7:56 AM, 04/29/2019 7:56 AM Performed by: Kathryne Hitch, CRNA, CRNA  Patient location: Pre-op. Preanesthetic checklist: patient identified, IV checked, site marked, risks and benefits discussed, surgical consent, monitors and equipment checked, pre-op evaluation, timeout performed and anesthesia consent Lidocaine 1% used for infiltration Left, radial was placed Catheter size: 20 Fr Hand hygiene performed  and maximum sterile barriers used   Attempts: 1 Procedure performed without using ultrasound guided technique. Following insertion, dressing applied and Biopatch. Post procedure assessment: normal and unchanged  Patient tolerated the procedure well with no immediate complications.

## 2019-04-29 NOTE — Anesthesia Procedure Notes (Signed)
Central Venous Catheter Insertion Performed by: Oleta Mouse, MD, anesthesiologist Start/End3/17/2021 7:46 AM, 04/29/2019 7:55 AM Patient location: Pre-op. Preanesthetic checklist: patient identified, IV checked, site marked, risks and benefits discussed, surgical consent, monitors and equipment checked, pre-op evaluation, timeout performed and anesthesia consent Position: Trendelenburg Lidocaine 1% used for infiltration and patient sedated Hand hygiene performed  and maximum sterile barriers used  Catheter size: 8.5 Fr Total catheter length 10. Sheath introducer Procedure performed using ultrasound guided technique. Ultrasound Notes:anatomy identified, needle tip was noted to be adjacent to the nerve/plexus identified, no ultrasound evidence of intravascular and/or intraneural injection and image(s) printed for medical record Attempts: 1 Following insertion, line sutured, dressing applied and Biopatch. Post procedure assessment: blood return through all ports, free fluid flow and no air  Patient tolerated the procedure well with no immediate complications.

## 2019-04-29 NOTE — Anesthesia Preprocedure Evaluation (Signed)
Anesthesia Evaluation  Patient identified by MRN, date of birth, ID band Patient awake    Reviewed: Allergy & Precautions, NPO status , Patient's Chart, lab work & pertinent test results  History of Anesthesia Complications Negative for: history of anesthetic complications  Airway Mallampati: II  TM Distance: >3 FB Neck ROM: Full    Dental  (+) Dental Advisory Given, Teeth Intact   Pulmonary asthma , neg recent URI,    breath sounds clear to auscultation       Cardiovascular hypertension, Pt. on medications (-) angina(-) Past MI and (-) CHF (-) dysrhythmias + Valvular Problems/Murmurs AS  Rhythm:Regular + Systolic murmurs    Neuro/Psych PSYCHIATRIC DISORDERS Depression  Neuromuscular disease    GI/Hepatic Neg liver ROS, GERD  Medicated and Controlled,  Endo/Other  diabetes, Type 2, Oral Hypoglycemic AgentsMorbid obesity  Renal/GU      Musculoskeletal   Abdominal   Peds  Hematology negative hematology ROS (+)   Anesthesia Other Findings   Reproductive/Obstetrics                             Anesthesia Physical Anesthesia Plan  ASA: IV  Anesthesia Plan: General   Post-op Pain Management:    Induction: Intravenous  PONV Risk Score and Plan: 2 and Ondansetron and Dexamethasone  Airway Management Planned: Double Lumen EBT  Additional Equipment: Arterial line, CVP, PA Cath, TEE and Ultrasound Guidance Line Placement  Intra-op Plan:   Post-operative Plan: Possible Post-op intubation/ventilation  Informed Consent: I have reviewed the patients History and Physical, chart, labs and discussed the procedure including the risks, benefits and alternatives for the proposed anesthesia with the patient or authorized representative who has indicated his/her understanding and acceptance.     Dental advisory given  Plan Discussed with: CRNA and Surgeon  Anesthesia Plan Comments:          Anesthesia Quick Evaluation

## 2019-04-29 NOTE — Progress Notes (Signed)
  Echocardiogram Echocardiogram Transesophageal has been performed.  Erik Allen 04/29/2019, 9:42 AM

## 2019-04-29 NOTE — Procedures (Signed)
Extubation Procedure Note  Patient Details:   Name: Erik Allen DOB: 1955-09-03 MRN: [id number]   Airway Documentation:    Vent end date: 04/29/19 Vent end time: 2105   Evaluation  O2 sats: stable throughout Complications: No apparent complications Patient did tolerate procedure well. Bilateral Breath Sounds: Diminished, Rhonchi    Patient extubated to 5L [place].  NIF -32, VC 950, positive cuff leak noted, patient able to vocalize post extubation. 553mL obtained on IS.  [name] 04/29/2019, 9:11 PM

## 2019-04-30 ENCOUNTER — Inpatient Hospital Stay (HOSPITAL_COMMUNITY): Payer: BC Managed Care – PPO

## 2019-04-30 ENCOUNTER — Ambulatory Visit: Payer: BC Managed Care – PPO | Admitting: Cardiology

## 2019-04-30 LAB — POCT I-STAT 7, (LYTES, BLD GAS, ICA,H+H)
Acid-Base Excess: 1 mmol/L (ref 0.0–2.0)
Acid-Base Excess: 3 mmol/L — ABNORMAL HIGH (ref 0.0–2.0)
Acid-Base Excess: 3 mmol/L — ABNORMAL HIGH (ref 0.0–2.0)
Acid-Base Excess: 1 mmol/L (ref 0.0–2.0)
Acid-base deficit: 2 mmol/L (ref 0.0–2.0)
Acid-base deficit: 2 mmol/L (ref 0.0–2.0)
Acid-base deficit: 4 mmol/L — ABNORMAL HIGH (ref 0.0–2.0)
Acid-base deficit: 5 mmol/L — ABNORMAL HIGH (ref 0.0–2.0)
Bicarbonate: 26.8 mmol/L (ref 20.0–28.0)
Bicarbonate: 28 mmol/L (ref 20.0–28.0)
Bicarbonate: 28.2 mmol/L — ABNORMAL HIGH (ref 20.0–28.0)
Bicarbonate: 25.2 mmol/L (ref 20.0–28.0)
Bicarbonate: 24.5 mmol/L (ref 20.0–28.0)
Bicarbonate: 24.7 mmol/L (ref 20.0–28.0)
Bicarbonate: 21.5 mmol/L (ref 20.0–28.0)
Bicarbonate: 20.9 mmol/L (ref 20.0–28.0)
Calcium, Ion: 0.99 mmol/L — ABNORMAL LOW (ref 1.15–1.40)
Calcium, Ion: 1.04 mmol/L — ABNORMAL LOW (ref 1.15–1.40)
Calcium, Ion: 1.07 mmol/L — ABNORMAL LOW (ref 1.15–1.40)
Calcium, Ion: 1.07 mmol/L — ABNORMAL LOW (ref 1.15–1.40)
Calcium, Ion: 0.97 mmol/L — ABNORMAL LOW (ref 1.15–1.40)
Calcium, Ion: 1.01 mmol/L — ABNORMAL LOW (ref 1.15–1.40)
Calcium, Ion: 1.06 mmol/L — ABNORMAL LOW (ref 1.15–1.40)
Calcium, Ion: 1.16 mmol/L (ref 1.15–1.40)
HCT: 29 % — ABNORMAL LOW (ref 39.0–52.0)
HCT: 31 % — ABNORMAL LOW (ref 39.0–52.0)
HCT: 30 % — ABNORMAL LOW (ref 39.0–52.0)
HCT: 29 % — ABNORMAL LOW (ref 39.0–52.0)
HCT: 29 % — ABNORMAL LOW (ref 39.0–52.0)
HCT: 31 % — ABNORMAL LOW (ref 39.0–52.0)
HCT: 22 % — ABNORMAL LOW (ref 39.0–52.0)
HCT: 26 % — ABNORMAL LOW (ref 39.0–52.0)
Hemoglobin: 10.5 g/dL — ABNORMAL LOW (ref 13.0–17.0)
Hemoglobin: 9.9 g/dL — ABNORMAL LOW (ref 13.0–17.0)
Hemoglobin: 10.2 g/dL — ABNORMAL LOW (ref 13.0–17.0)
Hemoglobin: 9.9 g/dL — ABNORMAL LOW (ref 13.0–17.0)
Hemoglobin: 9.9 g/dL — ABNORMAL LOW (ref 13.0–17.0)
Hemoglobin: 10.5 g/dL — ABNORMAL LOW (ref 13.0–17.0)
Hemoglobin: 7.5 g/dL — ABNORMAL LOW (ref 13.0–17.0)
Hemoglobin: 8.8 g/dL — ABNORMAL LOW (ref 13.0–17.0)
O2 Saturation: 100 %
O2 Saturation: 100 %
O2 Saturation: 100 %
O2 Saturation: 100 %
O2 Saturation: 82 %
O2 Saturation: 99 %
O2 Saturation: 93 %
O2 Saturation: 91 %
Patient temperature: 37.1
Patient temperature: 37.4
Potassium: 3.6 mmol/L (ref 3.5–5.1)
Potassium: 4 mmol/L (ref 3.5–5.1)
Potassium: 3.9 mmol/L (ref 3.5–5.1)
Potassium: 4.6 mmol/L (ref 3.5–5.1)
Potassium: 3.8 mmol/L (ref 3.5–5.1)
Potassium: 3.7 mmol/L (ref 3.5–5.1)
Potassium: 3.9 mmol/L (ref 3.5–5.1)
Potassium: 4 mmol/L (ref 3.5–5.1)
Sodium: 137 mmol/L (ref 135–145)
Sodium: 140 mmol/L (ref 135–145)
Sodium: 137 mmol/L (ref 135–145)
Sodium: 136 mmol/L (ref 135–145)
Sodium: 139 mmol/L (ref 135–145)
Sodium: 141 mmol/L (ref 135–145)
Sodium: 140 mmol/L (ref 135–145)
Sodium: 138 mmol/L (ref 135–145)
TCO2: 28 mmol/L (ref 22–32)
TCO2: 29 mmol/L (ref 22–32)
TCO2: 30 mmol/L (ref 22–32)
TCO2: 26 mmol/L (ref 22–32)
TCO2: 26 mmol/L (ref 22–32)
TCO2: 26 mmol/L (ref 22–32)
TCO2: 23 mmol/L (ref 22–32)
TCO2: 22 mmol/L (ref 22–32)
pCO2 arterial: 44.5 mmHg (ref 32.0–48.0)
pCO2 arterial: 45.7 mmHg (ref 32.0–48.0)
pCO2 arterial: 43.6 mmHg (ref 32.0–48.0)
pCO2 arterial: 38.9 mmHg (ref 32.0–48.0)
pCO2 arterial: 47.4 mmHg (ref 32.0–48.0)
pCO2 arterial: 51.4 mmHg — ABNORMAL HIGH (ref 32.0–48.0)
pCO2 arterial: 43.1 mmHg (ref 32.0–48.0)
pCO2 arterial: 40 mmHg (ref 32.0–48.0)
pH, Arterial: 7.387 (ref 7.350–7.450)
pH, Arterial: 7.394 (ref 7.350–7.450)
pH, Arterial: 7.419 (ref 7.350–7.450)
pH, Arterial: 7.419 (ref 7.350–7.450)
pH, Arterial: 7.322 — ABNORMAL LOW (ref 7.350–7.450)
pH, Arterial: 7.29 — ABNORMAL LOW (ref 7.350–7.450)
pH, Arterial: 7.306 — ABNORMAL LOW (ref 7.350–7.450)
pH, Arterial: 7.328 — ABNORMAL LOW (ref 7.350–7.450)
pO2, Arterial: 366 mmHg — ABNORMAL HIGH (ref 83.0–108.0)
pO2, Arterial: 387 mmHg — ABNORMAL HIGH (ref 83.0–108.0)
pO2, Arterial: 360 mmHg — ABNORMAL HIGH (ref 83.0–108.0)
pO2, Arterial: 331 mmHg — ABNORMAL HIGH (ref 83.0–108.0)
pO2, Arterial: 51 mmHg — ABNORMAL LOW (ref 83.0–108.0)
pO2, Arterial: 138 mmHg — ABNORMAL HIGH (ref 83.0–108.0)
pO2, Arterial: 74 mmHg — ABNORMAL LOW (ref 83.0–108.0)
pO2, Arterial: 66 mmHg — ABNORMAL LOW (ref 83.0–108.0)

## 2019-04-30 LAB — GLUCOSE, CAPILLARY
Glucose-Capillary: 124 mg/dL — ABNORMAL HIGH (ref 70–99)
Glucose-Capillary: 127 mg/dL — ABNORMAL HIGH (ref 70–99)
Glucose-Capillary: 134 mg/dL — ABNORMAL HIGH (ref 70–99)
Glucose-Capillary: 135 mg/dL — ABNORMAL HIGH (ref 70–99)
Glucose-Capillary: 136 mg/dL — ABNORMAL HIGH (ref 70–99)
Glucose-Capillary: 137 mg/dL — ABNORMAL HIGH (ref 70–99)
Glucose-Capillary: 142 mg/dL — ABNORMAL HIGH (ref 70–99)
Glucose-Capillary: 148 mg/dL — ABNORMAL HIGH (ref 70–99)
Glucose-Capillary: 148 mg/dL — ABNORMAL HIGH (ref 70–99)
Glucose-Capillary: 150 mg/dL — ABNORMAL HIGH (ref 70–99)
Glucose-Capillary: 153 mg/dL — ABNORMAL HIGH (ref 70–99)
Glucose-Capillary: 155 mg/dL — ABNORMAL HIGH (ref 70–99)
Glucose-Capillary: 155 mg/dL — ABNORMAL HIGH (ref 70–99)
Glucose-Capillary: 160 mg/dL — ABNORMAL HIGH (ref 70–99)
Glucose-Capillary: 163 mg/dL — ABNORMAL HIGH (ref 70–99)
Glucose-Capillary: 164 mg/dL — ABNORMAL HIGH (ref 70–99)

## 2019-04-30 LAB — POCT I-STAT, CHEM 8
BUN: 21 mg/dL (ref 8–23)
BUN: 19 mg/dL (ref 8–23)
BUN: 21 mg/dL (ref 8–23)
BUN: 23 mg/dL (ref 8–23)
BUN: 22 mg/dL (ref 8–23)
Calcium, Ion: 1.17 mmol/L (ref 1.15–1.40)
Calcium, Ion: 1 mmol/L — ABNORMAL LOW (ref 1.15–1.40)
Calcium, Ion: 1.02 mmol/L — ABNORMAL LOW (ref 1.15–1.40)
Calcium, Ion: 1.06 mmol/L — ABNORMAL LOW (ref 1.15–1.40)
Calcium, Ion: 0.95 mmol/L — ABNORMAL LOW (ref 1.15–1.40)
Chloride: 102 mmol/L (ref 98–111)
Chloride: 100 mmol/L (ref 98–111)
Chloride: 100 mmol/L (ref 98–111)
Chloride: 100 mmol/L (ref 98–111)
Chloride: 101 mmol/L (ref 98–111)
Creatinine, Ser: 0.8 mg/dL (ref 0.61–1.24)
Creatinine, Ser: 0.7 mg/dL (ref 0.61–1.24)
Creatinine, Ser: 0.8 mg/dL (ref 0.61–1.24)
Creatinine, Ser: 0.9 mg/dL (ref 0.61–1.24)
Creatinine, Ser: 0.9 mg/dL (ref 0.61–1.24)
Glucose, Bld: 136 mg/dL — ABNORMAL HIGH (ref 70–99)
Glucose, Bld: 126 mg/dL — ABNORMAL HIGH (ref 70–99)
Glucose, Bld: 141 mg/dL — ABNORMAL HIGH (ref 70–99)
Glucose, Bld: 132 mg/dL — ABNORMAL HIGH (ref 70–99)
Glucose, Bld: 150 mg/dL — ABNORMAL HIGH (ref 70–99)
HCT: 35 % — ABNORMAL LOW (ref 39.0–52.0)
HCT: 33 % — ABNORMAL LOW (ref 39.0–52.0)
HCT: 27 % — ABNORMAL LOW (ref 39.0–52.0)
HCT: 28 % — ABNORMAL LOW (ref 39.0–52.0)
HCT: 27 % — ABNORMAL LOW (ref 39.0–52.0)
Hemoglobin: 11.9 g/dL — ABNORMAL LOW (ref 13.0–17.0)
Hemoglobin: 11.2 g/dL — ABNORMAL LOW (ref 13.0–17.0)
Hemoglobin: 9.2 g/dL — ABNORMAL LOW (ref 13.0–17.0)
Hemoglobin: 9.5 g/dL — ABNORMAL LOW (ref 13.0–17.0)
Hemoglobin: 9.2 g/dL — ABNORMAL LOW (ref 13.0–17.0)
Potassium: 3.8 mmol/L (ref 3.5–5.1)
Potassium: 4 mmol/L (ref 3.5–5.1)
Potassium: 4.5 mmol/L (ref 3.5–5.1)
Potassium: 3.9 mmol/L (ref 3.5–5.1)
Potassium: 3.7 mmol/L (ref 3.5–5.1)
Sodium: 138 mmol/L (ref 135–145)
Sodium: 140 mmol/L (ref 135–145)
Sodium: 135 mmol/L (ref 135–145)
Sodium: 136 mmol/L (ref 135–145)
Sodium: 138 mmol/L (ref 135–145)
TCO2: 25 mmol/L (ref 22–32)
TCO2: 27 mmol/L (ref 22–32)
TCO2: 30 mmol/L (ref 22–32)
TCO2: 28 mmol/L (ref 22–32)
TCO2: 26 mmol/L (ref 22–32)

## 2019-04-30 LAB — BASIC METABOLIC PANEL
Anion gap: 9 (ref 5–15)
Anion gap: 12 (ref 5–15)
Anion gap: 11 (ref 5–15)
BUN: 20 mg/dL (ref 8–23)
BUN: 23 mg/dL (ref 8–23)
BUN: 26 mg/dL — ABNORMAL HIGH (ref 8–23)
CO2: 22 mmol/L (ref 22–32)
CO2: 19 mmol/L — ABNORMAL LOW (ref 22–32)
CO2: 20 mmol/L — ABNORMAL LOW (ref 22–32)
Calcium: 6.9 mg/dL — ABNORMAL LOW (ref 8.9–10.3)
Calcium: 7.9 mg/dL — ABNORMAL LOW (ref 8.9–10.3)
Calcium: 7.7 mg/dL — ABNORMAL LOW (ref 8.9–10.3)
Chloride: 109 mmol/L (ref 98–111)
Chloride: 106 mmol/L (ref 98–111)
Chloride: 106 mmol/L (ref 98–111)
Creatinine, Ser: 1.15 mg/dL (ref 0.61–1.24)
Creatinine, Ser: 1.68 mg/dL — ABNORMAL HIGH (ref 0.61–1.24)
Creatinine, Ser: 1.6 mg/dL — ABNORMAL HIGH (ref 0.61–1.24)
GFR calc Af Amer: >60 mL/min (ref >60)
GFR calc Af Amer: 50 mL/min — ABNORMAL LOW (ref >60)
GFR calc Af Amer: 53 mL/min — ABNORMAL LOW (ref >60)
GFR calc non Af Amer: >60 mL/min (ref >60)
GFR calc non Af Amer: 43 mL/min — ABNORMAL LOW (ref >60)
GFR calc non Af Amer: 46 mL/min — ABNORMAL LOW (ref >60)
Glucose, Bld: 133 mg/dL — ABNORMAL HIGH (ref 70–99)
Glucose, Bld: 171 mg/dL — ABNORMAL HIGH (ref 70–99)
Glucose, Bld: 159 mg/dL — ABNORMAL HIGH (ref 70–99)
Potassium: 4 mmol/L (ref 3.5–5.1)
Potassium: 4.1 mmol/L (ref 3.5–5.1)
Potassium: 3.9 mmol/L (ref 3.5–5.1)
Sodium: 140 mmol/L (ref 135–145)
Sodium: 137 mmol/L (ref 135–145)
Sodium: 137 mmol/L (ref 135–145)

## 2019-04-30 LAB — CBC
HCT: 25.4 % — ABNORMAL LOW (ref 39.0–52.0)
HCT: 26 % — ABNORMAL LOW (ref 39.0–52.0)
HCT: 27 % — ABNORMAL LOW (ref 39.0–52.0)
Hemoglobin: 8.3 g/dL — ABNORMAL LOW (ref 13.0–17.0)
Hemoglobin: 8.2 g/dL — ABNORMAL LOW (ref 13.0–17.0)
Hemoglobin: 8.5 g/dL — ABNORMAL LOW (ref 13.0–17.0)
MCH: 32.3 pg (ref 26.0–34.0)
MCH: 31.4 pg (ref 26.0–34.0)
MCH: 31.6 pg (ref 26.0–34.0)
MCHC: 32.7 g/dL (ref 30.0–36.0)
MCHC: 31.5 g/dL (ref 30.0–36.0)
MCHC: 31.5 g/dL (ref 30.0–36.0)
MCV: 98.8 fL (ref 80.0–100.0)
MCV: 99.6 fL (ref 80.0–100.0)
MCV: 100.4 fL — ABNORMAL HIGH (ref 80.0–100.0)
Platelets: 129 10*3/uL — ABNORMAL LOW (ref 150–400)
Platelets: 103 10*3/uL — ABNORMAL LOW (ref 150–400)
Platelets: 97 10*3/uL — ABNORMAL LOW (ref 150–400)
RBC: 2.57 MIL/uL — ABNORMAL LOW (ref 4.22–5.81)
RBC: 2.61 MIL/uL — ABNORMAL LOW (ref 4.22–5.81)
RBC: 2.69 MIL/uL — ABNORMAL LOW (ref 4.22–5.81)
RDW: 13.1 % (ref 11.5–15.5)
RDW: 13.6 % (ref 11.5–15.5)
RDW: 13.6 % (ref 11.5–15.5)
WBC: 16.3 10*3/uL — ABNORMAL HIGH (ref 4.0–10.5)
WBC: 19 10*3/uL — ABNORMAL HIGH (ref 4.0–10.5)
WBC: 18.2 10*3/uL — ABNORMAL HIGH (ref 4.0–10.5)
nRBC: 0 % (ref 0.0–0.2)
nRBC: 0 % (ref 0.0–0.2)
nRBC: 0 % (ref 0.0–0.2)

## 2019-04-30 LAB — COOXEMETRY PANEL
Carboxyhemoglobin: 1.1 % (ref 0.5–1.5)
Methemoglobin: 1.3 % (ref 0.0–1.5)
O2 Saturation: 59.5 %
Total hemoglobin: 11.8 g/dL — ABNORMAL LOW (ref 12.0–16.0)

## 2019-04-30 LAB — MAGNESIUM
Magnesium: 2.6 mg/dL — ABNORMAL HIGH (ref 1.7–2.4)
Magnesium: 2.3 mg/dL (ref 1.7–2.4)
Magnesium: 2.4 mg/dL (ref 1.7–2.4)

## 2019-04-30 LAB — SURGICAL PATHOLOGY

## 2019-04-30 MED ORDER — ALBUMIN HUMAN 5 % IV SOLN
12.5000 g | Freq: Once | INTRAVENOUS | Status: AC
  Administered 2019-04-30: 12.5 g via INTRAVENOUS
  Filled 2019-04-30: qty 250

## 2019-04-30 MED ORDER — FUROSEMIDE 10 MG/ML IJ SOLN
INTRAMUSCULAR | Status: AC
  Administered 2019-04-30: 40 mg via INTRAVENOUS
  Filled 2019-04-30: qty 4

## 2019-04-30 MED ORDER — KETOROLAC TROMETHAMINE 15 MG/ML IJ SOLN
30.0000 mg | Freq: Once | INTRAMUSCULAR | Status: AC
  Administered 2019-04-30: 30 mg via INTRAVENOUS
  Filled 2019-04-30: qty 2

## 2019-04-30 MED ORDER — POTASSIUM CHLORIDE 10 MEQ/50ML IV SOLN
10.0000 meq | Freq: Once | INTRAVENOUS | Status: AC
  Administered 2019-04-30: 10 meq via INTRAVENOUS
  Filled 2019-04-30: qty 50

## 2019-04-30 MED ORDER — KETOROLAC TROMETHAMINE 15 MG/ML IJ SOLN
15.0000 mg | Freq: Four times a day (QID) | INTRAMUSCULAR | Status: DC
  Administered 2019-04-30 – 2019-05-01 (×2): 15 mg via INTRAVENOUS
  Filled 2019-04-30 (×2): qty 1

## 2019-04-30 MED ORDER — METOCLOPRAMIDE HCL 5 MG/ML IJ SOLN
10.0000 mg | Freq: Four times a day (QID) | INTRAMUSCULAR | Status: AC
  Administered 2019-04-30 – 2019-05-01 (×4): 10 mg via INTRAVENOUS
  Filled 2019-04-30 (×4): qty 2

## 2019-04-30 MED ORDER — ENOXAPARIN SODIUM 30 MG/0.3ML ~~LOC~~ SOLN
30.0000 mg | Freq: Every day | SUBCUTANEOUS | Status: DC
  Administered 2019-05-01 – 2019-05-04 (×4): 30 mg via SUBCUTANEOUS
  Filled 2019-04-30 (×4): qty 0.3

## 2019-04-30 MED ORDER — INSULIN DETEMIR 100 UNIT/ML ~~LOC~~ SOLN
20.0000 [IU] | Freq: Once | SUBCUTANEOUS | Status: AC
  Administered 2019-04-30: 20 [IU] via SUBCUTANEOUS
  Filled 2019-04-30: qty 0.2

## 2019-04-30 MED ORDER — CALCIUM GLUCONATE-NACL 1-0.675 GM/50ML-% IV SOLN
1.0000 g | Freq: Once | INTRAVENOUS | Status: AC
  Administered 2019-04-30: 1000 mg via INTRAVENOUS
  Filled 2019-04-30: qty 50

## 2019-04-30 MED ORDER — FUROSEMIDE 10 MG/ML IJ SOLN: 40.0000 mg | Freq: Once | INTRAMUSCULAR | Status: AC

## 2019-04-30 MED ORDER — INSULIN ASPART 100 UNIT/ML ~~LOC~~ SOLN
0.0000 [IU] | SUBCUTANEOUS | Status: DC
  Administered 2019-04-30 (×2): 4 [IU] via SUBCUTANEOUS
  Administered 2019-04-30 – 2019-05-01 (×4): 2 [IU] via SUBCUTANEOUS
  Administered 2019-05-01: 4 [IU] via SUBCUTANEOUS
  Administered 2019-05-01: 2 [IU] via SUBCUTANEOUS
  Administered 2019-05-01: 4 [IU] via SUBCUTANEOUS
  Administered 2019-05-01 – 2019-05-02 (×3): 2 [IU] via SUBCUTANEOUS
  Administered 2019-05-02: 4 [IU] via SUBCUTANEOUS
  Administered 2019-05-02 – 2019-05-03 (×4): 2 [IU] via SUBCUTANEOUS

## 2019-04-30 MED ORDER — NOREPINEPHRINE 4 MG/250ML-% IV SOLN
0.0000 ug/min | INTRAVENOUS | Status: DC
  Administered 2019-04-30 (×2): 10 ug/min via INTRAVENOUS
  Filled 2019-04-30 (×2): qty 250

## 2019-04-30 MED ORDER — INSULIN DETEMIR 100 UNIT/ML ~~LOC~~ SOLN
20.0000 [IU] | Freq: Every day | SUBCUTANEOUS | Status: DC
  Administered 2019-05-01 – 2019-05-05 (×5): 20 [IU] via SUBCUTANEOUS
  Filled 2019-04-30 (×5): qty 0.2

## 2019-04-30 MED ORDER — ALBUMIN HUMAN 5 % IV SOLN
INTRAVENOUS | Status: AC
  Administered 2019-04-30: 12.5 g
  Filled 2019-04-30: qty 250

## 2019-04-30 MED FILL — Electrolyte-R (PH 7.4) Solution: INTRAVENOUS | Qty: 6000 | Status: AC

## 2019-04-30 MED FILL — Mannitol IV Soln 20%: INTRAVENOUS | Qty: 500 | Status: AC

## 2019-04-30 MED FILL — Heparin Sodium (Porcine) Inj 1000 Unit/ML: INTRAMUSCULAR | Qty: 30 | Status: AC

## 2019-04-30 MED FILL — Sodium Bicarbonate IV Soln 8.4%: INTRAVENOUS | Qty: 50 | Status: AC

## 2019-04-30 MED FILL — Potassium Chloride Inj 2 mEq/ML: INTRAVENOUS | Qty: 40 | Status: AC

## 2019-04-30 MED FILL — Heparin Sodium (Porcine) Inj 1000 Unit/ML: INTRAMUSCULAR | Qty: 20 | Status: AC

## 2019-04-30 MED FILL — Magnesium Sulfate Inj 50%: INTRAMUSCULAR | Qty: 10 | Status: AC

## 2019-04-30 MED FILL — Sodium Chloride IV Soln 0.9%: INTRAVENOUS | Qty: 4000 | Status: AC

## 2019-04-30 NOTE — Progress Notes (Signed)
Paged Bartle MD regarding pt's BP constantly at a MAP of low 60s, despite 2 albumin and 500 ml of LR given earlier per protocol. MD ordered 2 more of albumin.

## 2019-04-30 NOTE — Discharge Instructions (Signed)
Discharge Instructions:  1. You may shower, please wash incisions daily with soap and water and keep dry.  If you wish to cover wounds with dressing you may do so but please keep clean and change daily.  No tub baths or swimming until incisions have completely healed.  If your incisions become red or develop any drainage please call our office at [phone]  2. No Driving until cleared by Dr. Owen's office and you are no longer using narcotic pain medications  3. Monitor your weight daily.. Please use the same scale and weigh at same time... If you gain 5-10 lbs in 48 hours with associated lower extremity swelling, please contact our office at [phone]  4. Fever of 101.5 for at least 24 hours with no source, please contact our office at [phone]  5. Activity- up as tolerated, please walk at least 3 times per day.  Avoid strenuous activity, no lifting, pushing, or pulling with your arms over 8-10 lbs for a minimum of 2 weeks  6. If any questions or concerns arise, please do not hesitate to contact our office at [phone] 

## 2019-04-30 NOTE — Telephone Encounter (Signed)
Forms returned and placed in nurse box

## 2019-04-30 NOTE — Progress Notes (Signed)
Patient ID: Erik Allen, male   DOB: September 23, 1955, 64 y.o.   MRN: [id number] EVENING ROUNDS NOTE :     [address]       [place] [address]             [phone]                 1 Day Post-Op Procedure(s) (LRB): MINIMALLY INVASIVE AORTIC VALVE REPLACEMENT (AVR) USING INSPIRIS VALVE SIZE 23MM (N/A) TRANSESOPHAGEAL ECHOCARDIOGRAM (TEE) (N/A)  Total Length of Stay:  LOS: 1 day  BP 109/61   Pulse (!) 122   Temp 99.1 F (37.3 C)   Resp (!) 27   Ht 5\' 10"  (1.778 m)   Wt 113.7 kg Comment: standing scale  SpO2 94%   BMI 35.97 kg/m   .Intake/Output      03/17 0701 - 03/18 0700 03/18 0701 - 03/19 0700   P.O. 180    I.V. (mL/kg) 5681.8 (50) 918.9 (8.1)   Blood 503    IV Piggyback 1652.6 150.1   Total Intake(mL/kg) 8017.3 (70.5) 1069.1 (9.4)   Urine (mL/kg/hr) 2475 (0.9)    Emesis/NG output 120    Chest Tube 1165 150   Total Output 3760 150   Net +4257.3 +919.1          . sodium chloride Stopped (04/30/19 1129)  . cefUROXime (ZINACEF)  IV Stopped (04/30/19 1217)  . insulin 1.5 mL/hr at 04/30/19 [id number]  . lactated ringers    . lactated ringers    . milrinone Stopped (04/30/19 0830)  . norepinephrine (LEVOPHED) Adult infusion 2 mcg/min (04/30/19 1600)  . phenylephrine (NEO-SYNEPHRINE) Adult infusion Stopped (04/30/19 1134)     Lab Results  Component Value Date   WBC 19.0 (H) 04/30/2019   HGB 8.8 (L) 04/30/2019   HCT 26.0 (L) 04/30/2019   PLT 103 (L) 04/30/2019   GLUCOSE 171 (H) 04/30/2019   CHOL 158 08/14/2018   TRIG 151.0 (H) 08/14/2018   HDL 55.10 08/14/2018   LDLDIRECT 94.0 07/09/2017   LDLCALC 72 08/14/2018   ALT 72 (H) 04/27/2019   AST 46 (H) 04/27/2019   NA 138 04/30/2019   K 4.0 04/30/2019   CL 106 04/30/2019   CREATININE 1.68 (H) 04/30/2019   BUN 23 04/30/2019   CO2 19 (L) 04/30/2019   TSH 1.64 11/05/2016   PSA 0.15 07/09/2017   INR 1.3 (H) 04/29/2019   HGBA1C 6.2 (H) 04/27/2019   MICROALBUR 2.6 (H) 07/09/2017   Slow  progress On levophed  CI 2.7    Grace Isaac MD  Beeper [phone] Office [phone] 04/30/2019 5:50 PM

## 2019-04-30 NOTE — Progress Notes (Addendum)
[facility] DAILY ICU PROGRESS NOTE                   [address]            [place] [address]          [phone]   1 Day Post-Op Procedure(s) (LRB): MINIMALLY INVASIVE AORTIC VALVE REPLACEMENT (AVR) USING INSPIRIS VALVE SIZE 23MM (N/A) TRANSESOPHAGEAL ECHOCARDIOGRAM (TEE) (N/A)  Total Length of Stay:  LOS: 1 day   Subjective: Feels okay this morning. Taking short, shallow breaths.   Objective: Vital signs in last 24 hours: Temp:  [96.1 F (35.6 C)-99 F (37.2 C)] 98.8 F (37.1 C) (03/18 0800) Pulse Rate:  [73-120] 112 (03/18 0816) Cardiac Rhythm: Normal sinus rhythm (03/18 0400) Resp:  [12-39] 30 (03/18 0816) BP: (85-130)/(45-74) 103/55 (03/18 0816) SpO2:  [89 %-99 %] 92 % (03/18 0816) Arterial Line BP: (75-150)/(40-78) 96/41 (03/18 0800) FiO2 (%):  [40 %-60 %] 40 % (03/17 2032) Weight:  [113.7 kg] 113.7 kg (03/18 0500)  Filed Weights   04/29/19 0643 04/30/19 0500  Weight: 106.6 kg 113.7 kg    Weight change: 7.105 kg   Hemodynamic parameters for last 24 hours: PAP: (26-52)/(12-31) 40/18 CVP:  [17 mmHg] 17 mmHg CO:  [4.5 L/min-8.7 L/min] 8.6 L/min CI:  [2 L/min/m2-3.9 L/min/m2] 3.8 L/min/m2  Intake/Output from previous day: 03/17 0701 - 03/18 0700 In: 8017.3 [P.O.:180; I.V.:5681.8; Blood:503; IV Piggyback:1652.6] Out: 3760 [Urine:2475; Emesis/NG output:120; Chest AXKP:5374]  Intake/Output this shift: No intake/output data recorded.  Current Meds: Scheduled Meds: . acetaminophen  1,000 mg Oral Q6H  . aspirin EC  325 mg Oral Daily  . bisacodyl  10 mg Oral Daily   Or  . bisacodyl  10 mg Rectal Daily  . chlorhexidine gluconate (MEDLINE KIT)  15 mL Mouth Rinse BID  . Chlorhexidine Gluconate Cloth  6 each Topical Daily  . Chlorhexidine Gluconate Cloth  6 each Topical Daily  . docusate sodium  200 mg Oral Daily  . [START ON 05/01/2019] enoxaparin (LOVENOX) injection  30 mg Subcutaneous QHS  . finasteride  5 mg Oral Daily  . insulin aspart  0-24  Units Subcutaneous Q4H  . insulin detemir  20 Units Subcutaneous Once  . [START ON 05/01/2019] insulin detemir  20 Units Subcutaneous Daily  . mometasone-formoterol  2 puff Inhalation BID  . montelukast  10 mg Oral QHS  . pantoprazole  40 mg Oral QHS  . sodium chloride flush  10-40 mL Intracatheter Q12H  . sodium chloride flush  3 mL Intravenous Q12H   Continuous Infusions: . sodium chloride 100 mL/hr at 04/30/19 0700  . cefUROXime (ZINACEF)  IV Stopped (04/29/19 2145)  . insulin 1.5 mL/hr at 04/30/19 8270  . lactated ringers    . lactated ringers    . milrinone 0.125 mcg/kg/min (04/30/19 0743)  . phenylephrine (NEO-SYNEPHRINE) Adult infusion 100 mcg/min (04/30/19 0700)   PRN Meds:.dextrose, metoprolol tartrate, morphine injection, ondansetron (ZOFRAN) IV, oxyCODONE, sodium chloride flush, sodium chloride flush, traMADol  General appearance: alert, cooperative and no distress Heart: sinus tachycardia Lungs: clear to auscultation bilaterally and diminished in the lower lobes Abdomen: soft, non-tender; bowel sounds normal; no masses,  no organomegaly Extremities: upper ext edema Wound: clean and dry dressed with a sterile dressing  Lab Results: CBC: Recent Labs    04/29/19 2305 04/29/19 2305 04/30/19 0443 04/30/19 0820  WBC 16.6*  --  16.3*  --   HGB 9.1*   < > 8.3* 7.5*  HCT  28.2*   < > 25.4* 22.0*  PLT 131*  --  129*  --    < > = values in this interval not displayed.   BMET:  Recent Labs    04/29/19 2305 04/29/19 2305 04/30/19 0443 04/30/19 0820  NA 142   < > 140 140  K 3.8   < > 4.0 3.9  CL 110  --  109  --   CO2 22  --  22  --   GLUCOSE 157*  --  133*  --   BUN 17  --  20  --   CREATININE 0.90  --  1.15  --   CALCIUM 7.0*  --  6.9*  --    < > = values in this interval not displayed.    CMET: Lab Results  Component Value Date   WBC 16.3 (H) 04/30/2019   HGB 7.5 (L) 04/30/2019   HCT 22.0 (L) 04/30/2019   PLT 129 (L) 04/30/2019   GLUCOSE 133 (H)  04/30/2019   CHOL 158 08/14/2018   TRIG 151.0 (H) 08/14/2018   HDL 55.10 08/14/2018   LDLDIRECT 94.0 07/09/2017   LDLCALC 72 08/14/2018   ALT 72 (H) 04/27/2019   AST 46 (H) 04/27/2019   NA 140 04/30/2019   K 3.9 04/30/2019   CL 109 04/30/2019   CREATININE 1.15 04/30/2019   BUN 20 04/30/2019   CO2 22 04/30/2019   TSH 1.64 11/05/2016   PSA 0.15 07/09/2017   INR 1.3 (H) 04/29/2019   HGBA1C 6.2 (H) 04/27/2019   MICROALBUR 2.6 (H) 07/09/2017      PT/INR:  Recent Labs    04/29/19 1630  LABPROT 15.8*  INR 1.3*   Radiology: DG Chest Port 1 View  Result Date: 04/30/2019 CLINICAL DATA:  Chest tube, status post aortic valve repair EXAM: PORTABLE CHEST 1 VIEW COMPARISON:  04/29/2019 FINDINGS: Postoperative changes. Swan-Ganz catheter in the central right pulmonary artery. Endotracheal tube and NG tube are unchanged. Right chest tube remains in place. Small right apical pneumothorax, stable. Low lung volumes with areas of atelectasis. Cardiomegaly. IMPRESSION: Support devices stable. Small stable right apical pneumothorax. Low lung volumes with areas of atelectasis. Electronically Signed   By: Rolm Baptise M.D.   On: 04/30/2019 08:36   DG Chest Port 1 View  Result Date: 04/29/2019 CLINICAL DATA:  Atelectasis, postoperative EXAM: PORTABLE CHEST 1 VIEW COMPARISON:  04/27/2019 FINDINGS: Low volume AP portable examination with interval postoperative findings of thoracotomy and aortic valve replacement. Extensive support apparatus includes endotracheal tube with tip over the mid trachea, left neck pulmonary arterial catheter, tip directed over the right pulmonary artery, right neck vascular catheter, tip positioned near the superior cavoatrial junction, right chest and mediastinal drainage tubes, and esophagogastric tube. There may be a tiny, less than 10% right apical pneumothorax. IMPRESSION: 1. Interval postoperative findings status post aortic valve replacement. Appropriately positioned support  apparatus as detailed above. 2. There may be a tiny, less than 10% right apical pneumothorax with right-sided chest tube in position. Electronically Signed   By: Eddie Candle M.D.   On: 04/29/2019 16:42     Assessment/Plan: S/P Procedure(s) (LRB): MINIMALLY INVASIVE AORTIC VALVE REPLACEMENT (AVR) USING INSPIRIS VALVE SIZE 23MM (N/A) TRANSESOPHAGEAL ECHOCARDIOGRAM (TEE) (N/A)  1. CV-BP well controlled. Remains on Neo. Milrinone off. Sinus tachycardia in the 110s this morning.  2. Pulm-extubated, tolerating 5L [place]. CXR reviewed. Small right stable apical pneumothorax, low lung volumes with atelectasis. Over 1L out of chest tubes since surgery.  3. Renal-creatinine 1.15, electrolytes okay. Up 7kg but will wait to diuresis until off Neo.  4. H and H 8.3/25.4, expected acute blood loss anemia. Will trend and transfuse if < 7.0 5. Endo-blood glucose well controlled. Transition to SSI and Levemir.  6. Continue post-op antibiotics.  7. Continue lovenox for DVT prophylaxis. No coumadin ordered.   Plan: See progression orders. Needs to work on Chiropodist today. Pain is well controlled at the moment. Discontinue swan-ganz catheter and arterial line (once off Neo). OOB to chair. Keep chest tubes and foley one more day. Holding diuresis until off Neo.    Elgie Collard 04/30/2019 8:46 AM    I have seen and examined the patient and agree with the assessment and plan as outlined.  Doing well POD1.  Maintaining NSR - sinus tach with stable hemodynamics on milrinone 0.25 - vasodilated on Neo drip for BP support.  PA pressures and CVP much lower than initially after separation from CPB suggestive of recovery of RV function.   Wean milrinone off  Wean Neo as tolerated  Hold diuretics until BP improves  Watch Hgb/Hct  Leave chest tubes in place until output tapers off  Mobilize  Rexene Alberts, MD 04/30/2019 9:34 AM

## 2019-04-30 NOTE — Plan of Care (Signed)
Pt is alert and oriented, on 4L [place], breathing is even and unlabored. Pt's lungs are less rhonchus than earlier in the shift. Pt complains often of back pain, pt has been repositioned and medication has been given at least every 2 hours, (see MAR). Pt's chest tube output and urine output have decreased from 100 to 50 ml/hr. Pt is able to move all extremities but is very weak and complains of left leg numbness. Heart rate has been from 90 to 100, oxygen saturation in mid to low 90s, and bood pressure MAP has maintained in the high to low 60s. Pt has been encouraged often to not bear down and to take deep, full breaths. Pt clenches down when coughing. Aline remains pulsatile and cardiac index/output is WNL. Call bell is within reach and bed is in lowest position. Problem: Clinical Measurements: Goal: Respiratory complications will improve Outcome: Progressing Goal: Cardiovascular complication will be avoided Outcome: Progressing   Problem: Elimination: Goal: Will not experience complications related to urinary retention Outcome: Progressing   Problem: Safety: Goal: Ability to remain free from injury will improve Outcome: Progressing   Problem: Education: Goal: Knowledge of disease or condition will improve Outcome: Progressing   Problem: Clinical Measurements: Goal: Postoperative complications will be avoided or minimized Outcome: Progressing

## 2019-05-01 ENCOUNTER — Encounter: Payer: Self-pay | Admitting: *Deleted

## 2019-05-01 ENCOUNTER — Inpatient Hospital Stay (HOSPITAL_COMMUNITY): Payer: BC Managed Care – PPO

## 2019-05-01 LAB — CBC
HCT: 26.3 % — ABNORMAL LOW (ref 39.0–52.0)
HCT: 26.2 % — ABNORMAL LOW (ref 39.0–52.0)
Hemoglobin: 8.2 g/dL — ABNORMAL LOW (ref 13.0–17.0)
Hemoglobin: 8.3 g/dL — ABNORMAL LOW (ref 13.0–17.0)
MCH: 31.4 pg (ref 26.0–34.0)
MCH: 31.7 pg (ref 26.0–34.0)
MCHC: 31.2 g/dL (ref 30.0–36.0)
MCHC: 31.7 g/dL (ref 30.0–36.0)
MCV: 100.8 fL — ABNORMAL HIGH (ref 80.0–100.0)
MCV: 100 fL (ref 80.0–100.0)
Platelets: 93 10*3/uL — ABNORMAL LOW (ref 150–400)
Platelets: 103 10*3/uL — ABNORMAL LOW (ref 150–400)
RBC: 2.61 MIL/uL — ABNORMAL LOW (ref 4.22–5.81)
RBC: 2.62 MIL/uL — ABNORMAL LOW (ref 4.22–5.81)
RDW: 13.7 % (ref 11.5–15.5)
RDW: 13.8 % (ref 11.5–15.5)
WBC: 16.9 10*3/uL — ABNORMAL HIGH (ref 4.0–10.5)
WBC: 16 10*3/uL — ABNORMAL HIGH (ref 4.0–10.5)
nRBC: 0 % (ref 0.0–0.2)
nRBC: 0 % (ref 0.0–0.2)

## 2019-05-01 LAB — GLUCOSE, CAPILLARY
Glucose-Capillary: 132 mg/dL — ABNORMAL HIGH (ref 70–99)
Glucose-Capillary: 163 mg/dL — ABNORMAL HIGH (ref 70–99)
Glucose-Capillary: 168 mg/dL — ABNORMAL HIGH (ref 70–99)
Glucose-Capillary: 135 mg/dL — ABNORMAL HIGH (ref 70–99)
Glucose-Capillary: 136 mg/dL — ABNORMAL HIGH (ref 70–99)

## 2019-05-01 LAB — COOXEMETRY PANEL
Carboxyhemoglobin: 1.2 % (ref 0.5–1.5)
Methemoglobin: 1.5 % (ref 0.0–1.5)
O2 Saturation: 65 %
Total hemoglobin: 8.8 g/dL — ABNORMAL LOW (ref 12.0–16.0)

## 2019-05-01 LAB — BASIC METABOLIC PANEL
Anion gap: 11 (ref 5–15)
Anion gap: 11 (ref 5–15)
BUN: 29 mg/dL — ABNORMAL HIGH (ref 8–23)
BUN: 32 mg/dL — ABNORMAL HIGH (ref 8–23)
CO2: 23 mmol/L (ref 22–32)
CO2: 21 mmol/L — ABNORMAL LOW (ref 22–32)
Calcium: 8 mg/dL — ABNORMAL LOW (ref 8.9–10.3)
Calcium: 8.2 mg/dL — ABNORMAL LOW (ref 8.9–10.3)
Chloride: 105 mmol/L (ref 98–111)
Chloride: 105 mmol/L (ref 98–111)
Creatinine, Ser: 2.01 mg/dL — ABNORMAL HIGH (ref 0.61–1.24)
Creatinine, Ser: 1.43 mg/dL — ABNORMAL HIGH (ref 0.61–1.24)
GFR calc Af Amer: 40 mL/min — ABNORMAL LOW (ref >60)
GFR calc Af Amer: >60 mL/min (ref >60)
GFR calc non Af Amer: 35 mL/min — ABNORMAL LOW (ref >60)
GFR calc non Af Amer: 52 mL/min — ABNORMAL LOW (ref >60)
Glucose, Bld: 145 mg/dL — ABNORMAL HIGH (ref 70–99)
Glucose, Bld: 154 mg/dL — ABNORMAL HIGH (ref 70–99)
Potassium: 4.1 mmol/L (ref 3.5–5.1)
Potassium: 3.8 mmol/L (ref 3.5–5.1)
Sodium: 139 mmol/L (ref 135–145)
Sodium: 137 mmol/L (ref 135–145)

## 2019-05-01 MED ORDER — POTASSIUM CHLORIDE CRYS ER 20 MEQ PO TBCR
40.0000 meq | EXTENDED_RELEASE_TABLET | Freq: Every day | ORAL | Status: DC
  Administered 2019-05-01 – 2019-05-02 (×2): 40 meq via ORAL
  Filled 2019-05-01 (×4): qty 2

## 2019-05-01 MED ORDER — FUROSEMIDE 10 MG/ML IJ SOLN
40.0000 mg | Freq: Two times a day (BID) | INTRAMUSCULAR | Status: DC
  Administered 2019-05-01 – 2019-05-02 (×3): 40 mg via INTRAVENOUS
  Filled 2019-05-01 (×3): qty 4

## 2019-05-01 MED ORDER — POTASSIUM CHLORIDE CRYS ER 20 MEQ PO TBCR: 20.0000 meq | EXTENDED_RELEASE_TABLET | ORAL | Status: DC

## 2019-05-01 NOTE — Anesthesia Postprocedure Evaluation (Signed)
Anesthesia Post Note  Patient: Erik Allen  Procedure(s) Performed: MINIMALLY INVASIVE AORTIC VALVE REPLACEMENT (AVR) USING INSPIRIS VALVE SIZE 23MM (N/A Chest) TRANSESOPHAGEAL ECHOCARDIOGRAM (TEE) (N/A )     Patient location during evaluation: SICU Anesthesia Type: General Level of consciousness: sedated Pain management: pain level controlled Vital Signs Assessment: post-procedure vital signs reviewed and stable Respiratory status: patient remains intubated per anesthesia plan Cardiovascular status: stable Postop Assessment: no apparent nausea or vomiting Anesthetic complications: no    Last Vitals:  Vitals:   05/01/19 1400 05/01/19 1500  BP: 117/69 125/68  Pulse: (!) 121 (!) 124  Resp: (!) 26 (!) 34  Temp:  37.3 C  SpO2: 96% 95%    Last Pain:  Vitals:   05/01/19 1500  TempSrc: Oral  PainSc:                  Deadrick Stidd

## 2019-05-01 NOTE — Discharge Summary (Signed)
Physician Discharge Summary       [address]       [place] [address]             [phone]    Patient ID: RAYLON LAMSON MRN: [id number] DOB/AGE: Dec 02, 1955 64 y.o.  Admit date: 04/29/2019 Discharge date: 05/05/2019  Admission Diagnoses: Severe aortic stenosis  Discharge Diagnoses:  1. S/P minimally invasive aortic valve replacement with bioprosthetic 2. 3. History of HLD (hyperlipidemia) 4. History of essential hypertension 5. History of asthma 6. History of GERD 7. History of controlled type 2 diabetes mellitus without complication, without long-term current use of insulin ([place]) 8. History of severe obesity (BMI 35.0-39.9) with comorbidity ([place]) 9. History of trans aminitis-presumed fatty liver 10. History of CAP (community acquired pneumonia)  Consults: None  Procedure (s):   Minimally Invasive Aortic Valve Replacement Right Anterior Mini-thoracotomy Edwards Inspiris Resilia Stented Bovine Pericardial Tissue Valve (size 69m, catalog #[id number], serial #[id number] Plating of right 2nd and 3rd costal cartilages (KLS titanium plates) by Dr. ORoxy Mannson 04/29/2019.  History of Presenting Illness: Patient has reported history of scarlet fever during childhood and has been told that he has a heart murmur all of his life. More recently he has been diagnosed with aortic stenosis for which she has been followed with serial echocardiograms. Echocardiogram performed in 2019 revealed normal left ventricular systolic function with what was reported to be moderate aortic stenosis.Peak velocity across aortic valve was measured 4.1 m/s corresponding to mean transvalvular gradient estimated 31 mmHg and aortic valve area calculated 1.1 cm with DVI measured 0.5 at that time. He was seen in follow-up recently by his primary care physician and reported symptoms of exertional shortness of breath. He was referred for cardiology consultation and has been evaluated previously by  Dr. AGaren LahFollow-up echocardiogram performed January 29, 2021revealed severe aortic stenosis. Peak velocity across aortic valve measured 4.2 m/s corresponding to mean transvalvular gradient estimated 35 mmHg and aortic valve area calculated 0.95 cm with DVI measured 0.28. Diagnostic cardiac catheterization was performed April 06, 2019 and revealed normal coronary artery anatomy with no significant coronary artery disease. Right heart pressures were mildly elevated. Attempts to cross the aortic valve were unsuccessful due to severe calcification. Cardiothoracic surgical consultation was requested.  Patient is single and lives alone in [place] He works full-time as an aMusicianin a plant in [employer] The patient has been obese much of his adult life and does not exercise at all on a regular basis. He states that he used to walk regularly but he had to give that up 2 or 3 years ago because of the development of worsening fatigue and exertional shortness of breath. He states that over the past couple of years he has noticed significant increased fatigue with decreased energy and exertional shortness of breath and chest tightness. He denies any resting shortness of breath, PND, orthopnea, or lower extremity edema. He has had occasional slight dizzy spells without any frank syncopal episodes. He has a cough that is occasionally productive and made worse by laying flat in bed at night. He also describes exertional shortness   Patientwasagaincounseled at length regarding treatment alternatives for management of severe aortic stenosis including continued medical therapy versus proceeding with aortic valve replacement in the near future. The natural history of aortic stenosis was reviewed, as was long term prognosis with medical therapy alone. Surgical options were discussed at length including conventional surgical aortic valve replacement  through either  a full median sternotomy or using minimally invasive techniques. Other alternatives including transcatheter aortic valve replacement, patch enlargement of the aortic root, stentless porcine aortic root replacement, and homograft aortic root replacement were discussed. Discussion was held comparing the relative risks of mechanical valve replacement with need for lifelong anticoagulation versus use of a bioprosthetic tissue valve and the associated potential for late structural valve deterioration and failure. The patient understands and accepts all potential associated risks of surgery. He was admitted on 04/29/2019 in order to undergo a minimally invasive aortic valve replacement.  Brief Hospital Course:  The patient was extubated later the evening of surgery without difficulty. He remained afebrile. Because of low MAP, he was given multiple Albumin's and a 500 ml LR. He was weaned off Neo Synephrine and then started on Levophed, which was weanedSwan Ganz, a line, and foley were removed early in the post operative course. Chest tubes remained for several days and once output decreased, were removed on 03/22. Once his blood pressure was improved, he was volume over loaded and diuresed. He had expected ABL anemia. He did not require a post op transfusion. Last H and H was 8.1/25.3. He was weaned off the insulin drip.  Once he was tolerating a diet and creatinine was closer to normal, he was restarted on Metformin.  The patient's glucose remained well controlled.The patient's HGA1C pre op was 6.2. The patient was felt surgically stable for transfer from the ICU to [facility] for further convalescence on 3/21.  He continues to progress with cardiac rehab. He was initially requiring several liters of oxygen via [place] but was later ambulating on room air. He has been tolerating a diet and has had a bowel movement. Epicardial pacing wires were removed on 03/20.Chest tube sutures will be removed in the office  after discharge. He required some IV lasix for fluid overload and will be discharged on Lasix 9m BID for the first week and then 499mBID thereafter. The patient is felt surgically stable for discharge today.   Latest Vital Signs: Blood pressure (!) 147/82, pulse (!) 107, temperature 98.7 F (37.1 C), temperature source Oral, resp. rate (!) 26, height _0  (1.778 m), weight 112 kg, SpO2 98 %.  Physical Exam:  General appearance: alert, cooperative and no distress Heart: sinus tachycardia Lungs: clear to auscultation bilaterally Abdomen: soft, non-tender; bowel sounds normal; no masses,  no organomegaly Extremities: 1+ non pitting edema Wound: clean and dry with surrounding ecchymosis  Discharge Condition: Stable and discharged to home.  Recent laboratory studies:  Lab Results  Component Value Date   WBC 11.8 (H) 05/03/2019   HGB 8.1 (L) 05/03/2019   HCT 25.3 (L) 05/03/2019   MCV 98.8 05/03/2019   PLT 140 (L) 05/03/2019   Lab Results  Component Value Date   NA 139 05/03/2019   K 3.8 05/03/2019   CL 102 05/03/2019   CO2 28 05/03/2019   CREATININE 0.94 05/03/2019   GLUCOSE 137 (H) 05/03/2019      Diagnostic Studies: DG Chest 2 View  Result Date: 05/05/2019 CLINICAL DATA:  Postop film for valve replacement EXAM: CHEST - 2 VIEW COMPARISON:  Yesterday FINDINGS: Cardiomegaly. Vascular pedicle widening. Aortic valve replacement. The right chest tube has been removed without visible pneumothorax. Atelectasis on both sides that is stable. Small pleural effusions. IMPRESSION: 1. Chest tube removal without visible pneumothorax. 2. Stable low volumes and atelectasis. Electronically Signed   By: JoMonte Fantasia.D.   On: 05/05/2019 07:58   DG Chest  2 View  Result Date: 04/27/2019 CLINICAL DATA:  Severe aortic stenosis. Pre-op respiratory exam for TAVR. EXAM: CHEST - 2 VIEW COMPARISON:  01/31/2015 FINDINGS: The heart size and mediastinal contours are within normal limits. Both lungs  are clear. The visualized skeletal structures are unremarkable. IMPRESSION: No active cardiopulmonary disease. Electronically Signed   By: Marlaine Hind M.D.   On: 04/27/2019 10:00   CARDIAC CATHETERIZATION  Result Date: 04/06/2019 1.  Normal coronary arteries. 2.  Right heart catheterization showed high normal filling pressure with pulmonary capillary wedge pressure of 11 mmHg, high normal pulmonary pressure at 31 over 18 mmHg and normal cardiac output at 7.04 with a cardiac index of 3.18. 3.  Not able to cross the aortic valve mainly due to significant tortuosity of the innominate artery which made torquing of the catheter was very difficult.  Calcified aortic valve with restricted opening noted on fluoroscopy. Recommendations: Continue evaluation for aortic valve replacement. Consider bicuspid aortic valve as an etiology given the patient's relatively young age.  CT CORONARY MORPH W/CTA COR W/SCORE W/CA W/CM &/OR WO/CM  Addendum Date: 04/17/2019   ADDENDUM REPORT: 04/17/2019 18:13 CLINICAL DATA:  Severe Aortic Stenosis. EXAM: Cardiac TAVR CT TECHNIQUE: The patient was scanned on a [employer]. A 120 kV retrospective scan was triggered in the descending thoracic aorta at 111 HU's. Gantry rotation speed was 250 msecs and collimation was .6 mm. No beta blockade or nitro were given. The 3D data set was reconstructed in 5% intervals of the R-R cycle. Systolic and diastolic phases were analyzed on a dedicated work station using MPR, MIP and VRT modes. The patient received 80 cc of contrast. FINDINGS: Image quality: Excellent. A millisecond reconstruction was performed due to motion artifact. Noise artifact is: Limited. Valve Morphology: The aortic valve is severely calcified/thickened with restricted leaflet motion of all cusps. Aortic Valve area: 1.02 cm2 Aortic Valve Calcium score: 1772 Aortic annular dimension: Phase assessed: 30% Annular area: 501 mm2 Annular perimeter: 81.3 mm Max diameter: 29 mm  Min diameter: 22.5 mm Annular and subannular calcification: No significant subannular or LVOT calcification. Optimal coplanar projection: RAO 5 CRA 4 Coronary Artery Height above Annulus: Left Main: 11.6 mm Right Coronary: 14.7 mm Sinus of Valsalva Measurements: Non-coronary: 32 mm Right-coronary: 30 mm Left-coronary: 32 mm Sinus of Valsalva Height: 19 mm Sinotubular Junction: 29 mm.  Trace calcified plaque. Ascending Thoracic Aorta: 34 mm.  No significant plaque. Coronary Arteries: Normal coronary origin. Right dominance. The study was performed without use of NTG and is insufficient for plaque evaluation. Refer to recent cardiac cath for coronary assessment. Cardiac Morphology: Right Atrium: Right atrial size is within normal limits. Right Ventricle: The right ventricular cavity is within normal limits. Left Atrium: Left atrial size is normal in size with no left atrial appendage filling defect. Left Ventricle: The ventricular cavity size is within normal limits. There are no stigmata of prior infarction. There is no abnormal filling defect. Normal left ventricular function without regional wall motion abnormalities. LVEF=75%. Pulmonary arteries: Normal in size without proximal filling defect. Pulmonary veins: Normal pulmonary venous drainage. Pericardium: Normal thickness with no significant effusion or calcium present. Mitral Valve: The mitral valve is normal structure with mild MAC. Extra-cardiac findings: See attached radiology report for non-cardiac structures. IMPRESSION: 1. Annular measurements appropriate for 26 mm Edwards Sapien 3. No annular or sub-annular calcifications. 2. Sufficient coronary to annulus distance. 3. Optimum Fluoroscopic Angle for Delivery: RAO 5 CRA 4 4. No thrombus in the left atrial  appendage. Eleonore Chiquito, MD Electronically Signed   By: Eleonore Chiquito   On: 04/17/2019 18:13   Result Date: 04/17/2019 EXAM: OVER-READ INTERPRETATION  CT CHEST The following report is an over-read  performed by radiologist Dr. Vinnie Langton of [facility], [place] on 04/17/2019. This over-read does not include interpretation of cardiac or coronary anatomy or pathology. The coronary calcium score/coronary CTA interpretation by the cardiologist is attached. COMPARISON:  None. FINDINGS: Extracardiac findings will be described separately under dictation for contemporaneously obtained CTA chest, abdomen and pelvis. IMPRESSION: Please see separate dictation for contemporaneously obtained CTA chest, abdomen and pelvis dated 04/17/2019 for full description of relevant extracardiac findings. Electronically Signed: By: Vinnie Langton M.D. On: 04/17/2019 12:45   DG CHEST PORT 1 VIEW  Result Date: 05/04/2019 CLINICAL DATA:  Pleural effusion. EXAM: PORTABLE CHEST 1 VIEW COMPARISON:  May 01, 2019. FINDINGS: Stable cardiomegaly. Stable right-sided chest tube is noted with minimal right apical pneumothorax. Mild bibasilar subsegmental atelectasis is noted. IMPRESSION: Stable right-sided chest tube with minimal right apical pneumothorax. Mild bibasilar subsegmental atelectasis. Electronically Signed   By: Marijo Conception M.D.   On: 05/04/2019 08:10   DG Chest Port 1 View  Result Date: 05/01/2019 CLINICAL DATA:  Chest tube.  Open-heart surgery EXAM: PORTABLE CHEST 1 VIEW COMPARISON:  Yesterday FINDINGS: The left IJ line has been removed. Right IJ line in stable position. Chest tubes in place. Stable cardiomegaly and vascular pedicle widening. Aortic valve replacement. Very low volume chest with presumed atelectasis. No visible pneumothorax. IMPRESSION: 1. Very low volume chest with presumed atelectasis, stable. 2. No visible pneumothorax. Electronically Signed   By: Monte Fantasia M.D.   On: 05/01/2019 08:37   DG Chest Port 1 View  Result Date: 04/30/2019 CLINICAL DATA:  Chest tube, status post aortic valve repair EXAM: PORTABLE CHEST 1 VIEW COMPARISON:  04/29/2019 FINDINGS: Postoperative changes. Swan-Ganz  catheter in the central right pulmonary artery. Endotracheal tube and NG tube are unchanged. Right chest tube remains in place. Small right apical pneumothorax, stable. Low lung volumes with areas of atelectasis. Cardiomegaly. IMPRESSION: Support devices stable. Small stable right apical pneumothorax. Low lung volumes with areas of atelectasis. Electronically Signed   By: Rolm Baptise M.D.   On: 04/30/2019 08:36   DG Chest Port 1 View  Result Date: 04/29/2019 CLINICAL DATA:  Atelectasis, postoperative EXAM: PORTABLE CHEST 1 VIEW COMPARISON:  04/27/2019 FINDINGS: Low volume AP portable examination with interval postoperative findings of thoracotomy and aortic valve replacement. Extensive support apparatus includes endotracheal tube with tip over the mid trachea, left neck pulmonary arterial catheter, tip directed over the right pulmonary artery, right neck vascular catheter, tip positioned near the superior cavoatrial junction, right chest and mediastinal drainage tubes, and esophagogastric tube. There may be a tiny, less than 10% right apical pneumothorax. IMPRESSION: 1. Interval postoperative findings status post aortic valve replacement. Appropriately positioned support apparatus as detailed above. 2. There may be a tiny, less than 10% right apical pneumothorax with right-sided chest tube in position. Electronically Signed   By: Eddie Candle M.D.   On: 04/29/2019 16:42   CT ANGIO CHEST AORTA W/CM & OR WO/CM  Result Date: 04/17/2019 CLINICAL DATA:  64 year old male with history of severe aortic stenosis. Preprocedural study prior to potential transcatheter aortic valve replacement (TAVR) procedure. EXAM: CT ANGIOGRAPHY CHEST, ABDOMEN AND PELVIS TECHNIQUE: Multidetector CT imaging through the chest, abdomen and pelvis was performed using the standard protocol during bolus administration of intravenous contrast. Multiplanar reconstructed images and  MIPs were obtained and reviewed to evaluate the vascular  anatomy. CONTRAST:  156m OMNIPAQUE IOHEXOL 350 MG/ML SOLN COMPARISON:  None. FINDINGS: CTA CHEST FINDINGS Cardiovascular: Heart size is normal. There is no significant pericardial fluid, thickening or pericardial calcification. There is aortic atherosclerosis, as well as atherosclerosis of the great vessels of the mediastinum and the coronary arteries, including calcified atherosclerotic plaque in the left anterior descending coronary artery. Severe thickening and calcification of the aortic valve and moderate calcification of the inferior mitral annulus. Mediastinum/Lymph Nodes: No pathologically enlarged mediastinal or hilar lymph nodes. Esophagus is unremarkable in appearance. No axillary lymphadenopathy. Lungs/Pleura: Tiny pulmonary nodules are noted in the right upper lobe, largest of which measures only 4 mm (axial image 45 of series 16). No acute consolidative airspace disease. No pleural effusions. Musculoskeletal/Soft Tissues: There are no aggressive appearing lytic or blastic lesions noted in the visualized portions of the skeleton. CTA ABDOMEN AND PELVIS FINDINGS Hepatobiliary: No suspicious cystic or solid hepatic lesions. Diffuse low attenuation throughout the hepatic parenchyma, indicative of hepatic steatosis. No intra or extrahepatic biliary ductal dilatation. Gallbladder is normal in appearance. Pancreas: No pancreatic mass. No pancreatic ductal dilatation. No pancreatic or peripancreatic fluid collections or inflammatory changes. Spleen: Unremarkable. Adrenals/Urinary Tract: Bilateral kidneys and adrenal glands are normal in appearance. No hydroureteronephrosis. Urinary bladder is grossly unremarkable in appearance. Stomach/Bowel: Normal appearance of the stomach. Small periampullary duodenal diverticulum, without surrounding inflammatory changes. No pathologic dilatation of small bowel or colon. Multiple colonic diverticulae are noted, without surrounding inflammatory changes to suggest an acute  diverticulitis at this time. Normal appendix. Vascular/Lymphatic: Vascular findings and measurements pertinent to potential TAVR procedure, as detailed above. Aortic atherosclerosis, without evidence of aneurysm or dissection in the abdominal or pelvic vasculature. No lymphadenopathy noted in the abdomen or pelvis. Reproductive: Prostate gland and seminal vesicles are unremarkable in appearance. Other: No significant volume of ascites.  No pneumoperitoneum. Musculoskeletal: There are no aggressive appearing lytic or blastic lesions noted in the visualized portions of the skeleton. VASCULAR MEASUREMENTS PERTINENT TO TAVR: AORTA: Minimal Aortic Diameter-17 x 17 mm Severity of Aortic Calcification-minimal RIGHT PELVIS: Right Common Iliac Artery - Minimal Diameter-11.0 x 10.5 mm Tortuosity-mild Calcification-none Right External Iliac Artery - Minimal Diameter-8.4 x 8.2 mm Tortuosity-moderate Calcification-none Right Common Femoral Artery - Minimal Diameter-7.9 x 7.5 mm Tortuosity-mild Calcification-none LEFT PELVIS: Left Common Iliac Artery - Minimal Diameter-11.4 x 10.1 mm Tortuosity-mild Calcification-minimal Left External Iliac Artery - Minimal Diameter-7.8 x 8.5 mm Tortuosity-moderate Calcification-none Left Common Femoral Artery - Minimal Diameter-8.7 x 8.0 mm Tortuosity-mild Calcification-none Review of the MIP images confirms the above findings. IMPRESSION: 1. Vascular findings and measurements pertinent to potential TAVR procedure, as detailed above. 2. Severe thickening calcification of the aortic valve, compatible with the reported clinical history of severe aortic stenosis. 3. Aortic atherosclerosis, in addition to left anterior descending coronary artery disease. 4. 4 mm right upper lobe pulmonary nodule, nonspecific, but statistically likely benign. No follow-up needed if patient is low-risk. Non-contrast chest CT can be considered in 12 months if patient is high-risk. This recommendation follows the  consensus statement: Guidelines for Management of Incidental Pulmonary Nodules Detected on CT Images: From the Fleischner Society 2017; Radiology 2017; 284:228-243. 5. Additional incidental findings, as above. Electronically Signed   By: DVinnie LangtonM.D.   On: 04/17/2019 13:58   VAS [place]DOPPLER PRE CABG  Result Date: 04/27/2019 PREOPERATIVE VASCULAR EVALUATION  Other Factors:    Aortic valve disease. Comparison Study: No prior study Performing Technologist: Candace  Mauro Kaufmann RVS  Examination Guidelines: A complete evaluation includes B-mode imaging, spectral Doppler, color Doppler, and power Doppler as needed of all accessible portions of each vessel. Bilateral testing is considered an integral part of a complete examination. Limited examinations for reoccurring indications may be performed as noted.  Right Carotid Findings: +----------+--------+--------+--------+--------+--------+           PSV cm/sEDV cm/sStenosisDescribeComments +----------+--------+--------+--------+--------+--------+ CCA Prox  105     20                               +----------+--------+--------+--------+--------+--------+ CCA Distal81      26                               +----------+--------+--------+--------+--------+--------+ ICA Prox  83      24                               +----------+--------+--------+--------+--------+--------+ ICA Distal101     42                               +----------+--------+--------+--------+--------+--------+ ECA       87      24                               +----------+--------+--------+--------+--------+--------+ Portions of this table do not appear on this page. +---------+--------+--+--------+--+ VertebralPSV cm/s38EDV cm/s12 +---------+--------+--+--------+--+ Left Carotid Findings: +----------+--------+--------+--------+--------+------------------+           PSV cm/sEDV cm/sStenosisDescribeComments            +----------+--------+--------+--------+--------+------------------+ CCA Prox  119     25                      intimal thickening +----------+--------+--------+--------+--------+------------------+ CCA Distal90      22                      intimal thickening +----------+--------+--------+--------+--------+------------------+ ICA Prox  73      24                                         +----------+--------+--------+--------+--------+------------------+ ICA Distal86      39                                         +----------+--------+--------+--------+--------+------------------+ ECA       67      15                                         +----------+--------+--------+--------+--------+------------------+ +----------+--------+--------+--------+------------+ SubclavianPSV cm/sEDV cm/sDescribeArm Pressure +----------+--------+--------+--------+------------+           101                                  +----------+--------+--------+--------+------------+ +---------+--------+--+--------+--+ VertebralPSV cm/s49EDV cm/s17 +---------+--------+--+--------+--+  ABI Findings: +--------+------------------+-----+---------+--------+ Right   Rt Pressure (mmHg)IndexWaveform  Comment  +--------+------------------+-----+---------+--------+ Brachial                       triphasic         +--------+------------------+-----+---------+--------+ +--------+------------------+-----+---------+-------+ Left    Lt Pressure (mmHg)IndexWaveform Comment +--------+------------------+-----+---------+-------+ Brachial                       triphasic        +--------+------------------+-----+---------+-------+  Right Doppler Findings: +--------+--------+-----+---------+--------+ Site    PressureIndexDoppler  Comments +--------+--------+-----+---------+--------+ Brachial             triphasic         +--------+--------+-----+---------+--------+ Radial                triphasic         +--------+--------+-----+---------+--------+ Ulnar                triphasic         +--------+--------+-----+---------+--------+  Left Doppler Findings: +--------+--------+-----+---------+--------+ Site    PressureIndexDoppler  Comments +--------+--------+-----+---------+--------+ Brachial             triphasic         +--------+--------+-----+---------+--------+ Radial               triphasic         +--------+--------+-----+---------+--------+ Ulnar                triphasic         +--------+--------+-----+---------+--------+  Summary: Right Carotid: The extracranial vessels were near-normal with only minimal wall                thickening or plaque. Left Carotid: The extracranial vessels were near-normal with only minimal wall               thickening or plaque. Vertebrals:  Bilateral vertebral arteries demonstrate antegrade flow. Subclavians: Right subclavian artery was not visualized. Normal flow              hemodynamics were seen in the left subclavian artery. Right Upper Extremity: Doppler waveforms remain within normal limits with right radial compression. Doppler waveforms remain within normal limits with right ulnar compression. Left Upper Extremity: Doppler waveforms remain within normal limits with left radial compression. Doppler waveforms remain within normal limits with left ulnar compression.  Electronically signed by Ruta Hinds MD on 04/27/2019 at 1:07:18 PM.    Final    CT Angio Abd/Pel w/ and/or w/o  Result Date: 04/17/2019 CLINICAL DATA:  64 year old male with history of severe aortic stenosis. Preprocedural study prior to potential transcatheter aortic valve replacement (TAVR) procedure. EXAM: CT ANGIOGRAPHY CHEST, ABDOMEN AND PELVIS TECHNIQUE: Multidetector CT imaging through the chest, abdomen and pelvis was performed using the standard protocol during bolus administration of intravenous contrast. Multiplanar reconstructed images  and MIPs were obtained and reviewed to evaluate the vascular anatomy. CONTRAST:  142m OMNIPAQUE IOHEXOL 350 MG/ML SOLN COMPARISON:  None. FINDINGS: CTA CHEST FINDINGS Cardiovascular: Heart size is normal. There is no significant pericardial fluid, thickening or pericardial calcification. There is aortic atherosclerosis, as well as atherosclerosis of the great vessels of the mediastinum and the coronary arteries, including calcified atherosclerotic plaque in the left anterior descending coronary artery. Severe thickening and calcification of the aortic valve and moderate calcification of the inferior mitral annulus. Mediastinum/Lymph Nodes: No pathologically enlarged mediastinal or hilar lymph nodes. Esophagus is unremarkable in appearance. No axillary lymphadenopathy. Lungs/Pleura: Tiny pulmonary nodules are noted in the right upper lobe, largest of which measures  only 4 mm (axial image 45 of series 16). No acute consolidative airspace disease. No pleural effusions. Musculoskeletal/Soft Tissues: There are no aggressive appearing lytic or blastic lesions noted in the visualized portions of the skeleton. CTA ABDOMEN AND PELVIS FINDINGS Hepatobiliary: No suspicious cystic or solid hepatic lesions. Diffuse low attenuation throughout the hepatic parenchyma, indicative of hepatic steatosis. No intra or extrahepatic biliary ductal dilatation. Gallbladder is normal in appearance. Pancreas: No pancreatic mass. No pancreatic ductal dilatation. No pancreatic or peripancreatic fluid collections or inflammatory changes. Spleen: Unremarkable. Adrenals/Urinary Tract: Bilateral kidneys and adrenal glands are normal in appearance. No hydroureteronephrosis. Urinary bladder is grossly unremarkable in appearance. Stomach/Bowel: Normal appearance of the stomach. Small periampullary duodenal diverticulum, without surrounding inflammatory changes. No pathologic dilatation of small bowel or colon. Multiple colonic diverticulae are noted,  without surrounding inflammatory changes to suggest an acute diverticulitis at this time. Normal appendix. Vascular/Lymphatic: Vascular findings and measurements pertinent to potential TAVR procedure, as detailed above. Aortic atherosclerosis, without evidence of aneurysm or dissection in the abdominal or pelvic vasculature. No lymphadenopathy noted in the abdomen or pelvis. Reproductive: Prostate gland and seminal vesicles are unremarkable in appearance. Other: No significant volume of ascites.  No pneumoperitoneum. Musculoskeletal: There are no aggressive appearing lytic or blastic lesions noted in the visualized portions of the skeleton. VASCULAR MEASUREMENTS PERTINENT TO TAVR: AORTA: Minimal Aortic Diameter-17 x 17 mm Severity of Aortic Calcification-minimal RIGHT PELVIS: Right Common Iliac Artery - Minimal Diameter-11.0 x 10.5 mm Tortuosity-mild Calcification-none Right External Iliac Artery - Minimal Diameter-8.4 x 8.2 mm Tortuosity-moderate Calcification-none Right Common Femoral Artery - Minimal Diameter-7.9 x 7.5 mm Tortuosity-mild Calcification-none LEFT PELVIS: Left Common Iliac Artery - Minimal Diameter-11.4 x 10.1 mm Tortuosity-mild Calcification-minimal Left External Iliac Artery - Minimal Diameter-7.8 x 8.5 mm Tortuosity-moderate Calcification-none Left Common Femoral Artery - Minimal Diameter-8.7 x 8.0 mm Tortuosity-mild Calcification-none Review of the MIP images confirms the above findings. IMPRESSION: 1. Vascular findings and measurements pertinent to potential TAVR procedure, as detailed above. 2. Severe thickening calcification of the aortic valve, compatible with the reported clinical history of severe aortic stenosis. 3. Aortic atherosclerosis, in addition to left anterior descending coronary artery disease. 4. 4 mm right upper lobe pulmonary nodule, nonspecific, but statistically likely benign. No follow-up needed if patient is low-risk. Non-contrast chest CT can be considered in 12 months if  patient is high-risk. This recommendation follows the consensus statement: Guidelines for Management of Incidental Pulmonary Nodules Detected on CT Images: From the Fleischner Society 2017; Radiology 2017; 284:228-243. 5. Additional incidental findings, as above. Electronically Signed   By: Vinnie Langton M.D.   On: 04/17/2019 13:58     Discharge Medications: Allergies as of 05/05/2019      Reactions   Ace Inhibitors Cough   Codeine Other (See Comments)   REACTION: DIZZY / HEADACHE   Tramadol Nausea And Vomiting      Medication List    TAKE these medications   Accu-Chek FastClix Lancets Misc Use as directed to check blood sugar daily   Accu-Chek Guide test strip Generic drug: glucose blood USE AS INSTRUCTED   Accu-Chek Guide w/Device Kit 1 each by Does not apply route as directed.   albuterol (2.5 MG/3ML) 0.083% nebulizer solution Commonly known as: PROVENTIL USE 1 VIAL VIA NEBULIZER EVERY 6 HOURS AS NEEDED What changed:   how much to take  how to take this  when to take this  reasons to take this  additional instructions   albuterol 108 (90 Base) MCG/ACT inhaler Commonly  known as: VENTOLIN HFA INHALE 2 PUFFS INTO THE LUNGS EVERY 6 HOURS AS NEEDED FOR WHEEZING OR SHORTNESS OF BREATH What changed: Another medication with the same name was changed. Make sure you understand how and when to take each.   aspirin 325 MG EC tablet Take 1 tablet (325 mg total) by mouth daily. Start taking on: May 06, 2019   finasteride 5 MG tablet Commonly known as: PROSCAR TAKE 1 TABLET BY MOUTH EVERY DAY What changed: additional instructions   Fluticasone-Salmeterol 250-50 MCG/DOSE Aepb Commonly known as: Advair Diskus INHALE 1 PUFF BY MOUTH TWICE DAILY. RINSE MOUTH AFTER USE   furosemide 40 MG tablet Commonly known as: Lasix Take 2 tablets (80 mg total) by mouth 2 (two) times daily for 7 days. then take 2m BID until we see you in follow-up.   guaiFENesin 600 MG 12 hr  tablet Commonly known as: MUCINEX Take 2 tablets (1,200 mg total) by mouth 2 (two) times daily.   metFORMIN 500 MG tablet Commonly known as: GLUCOPHAGE TAKE 1 TABLET (500 MG TOTAL) BY MOUTH 2 (TWO) TIMES DAILY WITH A MEAL.   metoprolol tartrate 50 MG tablet Commonly known as: LOPRESSOR Take 1 tablet (50 mg total) by mouth 2 (two) times daily.   montelukast 10 MG tablet Commonly known as: SINGULAIR TAKE 1 TABLET BY MOUTH EVERY NIGHT AT BEDTIME   oxyCODONE 5 MG immediate release tablet Commonly known as: Oxy IR/ROXICODONE Take 1 tablet (5 mg total) by mouth every 6 (six) hours as needed for severe pain.   pantoprazole 40 MG tablet Commonly known as: PROTONIX Take 40 mg by mouth at bedtime.   Potassium Chloride ER 20 MEQ Tbcr Take 40 mEq by mouth 2 (two) times daily for 7 days. Then take 20MEQ (1 tab) by mouth BID until we see you in follow-up.   simvastatin 80 MG tablet Commonly known as: ZOCOR TAKE 1 TABLET BY MOUTH EVERYDAY AT BEDTIME What changed: See the new instructions.   valsartan-hydrochlorothiazide 160-25 MG tablet Commonly known as: DIOVAN-HCT Take 1 tablet by mouth daily.   vitamin B-12 500 MCG tablet Commonly known as: V-R VITAMIN B-12 Take 1 tablet (500 mcg total) by mouth daily.   Vitamin D 50 MCG (2000 UT) Caps Take 2,000 Units by mouth in the morning and at bedtime.      The patient has been discharged on:   1.Beta Blocker:  Yes [  x ]                              No   [   ]                              If No, reason:  2.Ace Inhibitor/ARB: Yes [ x ]                                     No  [    ]                                     If No, reason:  3.Statin:   Yes [  x ]  No  [   ]                  If No, reason:  4.Ecasa:  Yes  [x   ]                  No   [   ]                  If No, reason:  Follow Up Appointments: Follow-up Information    Kate Sable, MD. Go on 05/21/2019.   Specialties: Cardiology,  Radiology Why: Appointment time is at 9:00 am Contact information: [place] [address] [phone]        [facility] [place]. Go on 05/18/2019.   Specialty: Cardiothoracic Surgery Why: PA/LAT CXR to be taken (at [facility] which is in the same building as Dr. Guy Sandifer office) on 04/05 at 12:30 pm;Appointment time is at 1:00 pm Contact information: [address] [place], [place] [phone]          Signed: [place] 05/05/2019, 10:17 AM

## 2019-05-01 NOTE — Progress Notes (Addendum)
TCTS DAILY ICU PROGRESS NOTE                   [address]            [place] [address]          [phone]   2 Days Post-Op Procedure(s) (LRB): MINIMALLY INVASIVE AORTIC VALVE REPLACEMENT (AVR) USING INSPIRIS VALVE SIZE 23MM (N/A) TRANSESOPHAGEAL ECHOCARDIOGRAM (TEE) (N/A)  Total Length of Stay:  LOS: 2 days   Subjective: Wants to walk this morning, feet are "numb" however can feel my touch.   Objective: Vital signs in last 24 hours: Temp:  [98 F (36.7 C)-100 F (37.8 C)] 98 F (36.7 C) (03/19 0400) Pulse Rate:  [109-128] 116 (03/19 0630) Cardiac Rhythm: Sinus tachycardia (03/19 0400) Resp:  [15-38] 22 (03/19 0630) BP: (93-136)/(55-87) 110/72 (03/19 0630) SpO2:  [86 %-97 %] 95 % (03/19 0630) Arterial Line BP: (91-144)/(41-65) 91/48 (03/19 0630) Weight:  [116.9 kg] 116.9 kg (03/19 0400)  Filed Weights   04/29/19 0643 04/30/19 0500 05/01/19 0400  Weight: 106.6 kg 113.7 kg 116.9 kg    Weight change: 3.2 kg   Hemodynamic parameters for last 24 hours: PAP: (35-53)/(14-32) 46/29 CVP:  [12 mmHg-29 mmHg] 13 mmHg CO:  [6.2 L/min-8.7 L/min] 6.2 L/min CI:  [2.8 L/min/m2-3.9 L/min/m2] 2.8 L/min/m2  Intake/Output from previous day: 03/18 0701 - 03/19 0700 In: 1933.6 [P.O.:500; I.V.:1183.4; IV Piggyback:250.1] Out: 1880 [Urine:1150; Chest Tube:730]  Intake/Output this shift: No intake/output data recorded.  Current Meds: Scheduled Meds: . acetaminophen  1,000 mg Oral Q6H  . aspirin EC  325 mg Oral Daily  . bisacodyl  10 mg Oral Daily   Or  . bisacodyl  10 mg Rectal Daily  . chlorhexidine gluconate (MEDLINE KIT)  15 mL Mouth Rinse BID  . Chlorhexidine Gluconate Cloth  6 each Topical Daily  . Chlorhexidine Gluconate Cloth  6 each Topical Daily  . docusate sodium  200 mg Oral Daily  . enoxaparin (LOVENOX) injection  30 mg Subcutaneous QHS  . finasteride  5 mg Oral Daily  . insulin aspart  0-24 Units Subcutaneous Q4H  . insulin detemir  20 Units  Subcutaneous Daily  . ketorolac  15 mg Intravenous Q6H  . metoCLOPramide (REGLAN) injection  10 mg Intravenous Q6H  . mometasone-formoterol  2 puff Inhalation BID  . montelukast  10 mg Oral QHS  . pantoprazole  40 mg Oral QHS  . sodium chloride flush  10-40 mL Intracatheter Q12H  . sodium chloride flush  3 mL Intravenous Q12H   Continuous Infusions: . sodium chloride Stopped (04/30/19 1129)  . cefUROXime (ZINACEF)  IV Stopped (04/30/19 2241)  . insulin 1.5 mL/hr at 04/30/19 5993  . lactated ringers    . lactated ringers    . milrinone Stopped (04/30/19 0830)  . norepinephrine (LEVOPHED) Adult infusion Stopped (05/01/19 0650)  . phenylephrine (NEO-SYNEPHRINE) Adult infusion Stopped (04/30/19 1134)   PRN Meds:.dextrose, metoprolol tartrate, morphine injection, ondansetron (ZOFRAN) IV, oxyCODONE, sodium chloride flush, sodium chloride flush, traMADol  General appearance: alert, cooperative and no distress Heart: sinus tachycardia Lungs: clear to auscultation bilaterally Abdomen: soft, non-tender; bowel sounds normal; no masses,  no organomegaly Extremities: upper extremity edema Wound: clean and dry  Lab Results: CBC: Recent Labs    04/30/19 1705 05/01/19 0313  WBC 18.2* 16.9*  HGB 8.5* 8.2*  HCT 27.0* 26.3*  PLT 97* 93*   BMET:  Recent Labs    04/30/19 1705 05/01/19 0313  NA 137  139  K 3.9 4.1  CL 106 105  CO2 20* 23  GLUCOSE 159* 145*  BUN 26* 29*  CREATININE 1.60* 2.01*  CALCIUM 7.7* 8.0*    CMET: Lab Results  Component Value Date   WBC 16.9 (H) 05/01/2019   HGB 8.2 (L) 05/01/2019   HCT 26.3 (L) 05/01/2019   PLT 93 (L) 05/01/2019   GLUCOSE 145 (H) 05/01/2019   CHOL 158 08/14/2018   TRIG 151.0 (H) 08/14/2018   HDL 55.10 08/14/2018   LDLDIRECT 94.0 07/09/2017   LDLCALC 72 08/14/2018   ALT 72 (H) 04/27/2019   AST 46 (H) 04/27/2019   NA 139 05/01/2019   K 4.1 05/01/2019   CL 105 05/01/2019   CREATININE 2.01 (H) 05/01/2019   BUN 29 (H) 05/01/2019    CO2 23 05/01/2019   TSH 1.64 11/05/2016   PSA 0.15 07/09/2017   INR 1.3 (H) 04/29/2019   HGBA1C 6.2 (H) 04/27/2019   MICROALBUR 2.6 (H) 07/09/2017      PT/INR:  Recent Labs    04/29/19 1630  LABPROT 15.8*  INR 1.3*   Radiology: No results found.   Assessment/Plan: S/P Procedure(s) (LRB): MINIMALLY INVASIVE AORTIC VALVE REPLACEMENT (AVR) USING INSPIRIS VALVE SIZE 23MM (N/A) TRANSESOPHAGEAL ECHOCARDIOGRAM (TEE) (N/A)  1. CV-sinus tachycardia, rate 110s. MAPs mostly 70s. Off all drips 2. Pulm-tolerating 5L [place]. CXR shows low lung volumes and atelectasis. Chest tubes put out >700cc/24 hours, would keep. 3. Renal-creatinine 2.01, will discontinue Toradol. electrolytes okay. Will probably need another dose of lasix this morning. Remains fluid overloaded. Weight up 10kg 4. H and H 8.2/26.3, expected acute blood loss anemia. Stable.  5. Endo-blood glucose well controlled. Continue SSI and Levemir.  6. Continue lovenox for DVT prophylaxis.   Plan: Discontinue toradol for bump in creatinine. Encouraged ambulation and use of incentive spirometer. Can discontinue arterial line since off all drips. Would continue chest tubes for one more day at least. Needs lasix this morning for fluid overload. Weaning oxygen as tolerated.    Elgie Collard 05/01/2019 7:26 AM   Agree with above Tachy, soft blood pressure Increase in creat, good uop Will d/c toradol, may decrease lasix Will keep in the unit for I/O

## 2019-05-01 NOTE — Progress Notes (Signed)
EVENING ROUNDS NOTE :     [address]       [place] [address]             [phone]                 2 Days Post-Op Procedure(s) (LRB): MINIMALLY INVASIVE AORTIC VALVE REPLACEMENT (AVR) USING INSPIRIS VALVE SIZE 23MM (N/A) TRANSESOPHAGEAL ECHOCARDIOGRAM (TEE) (N/A)   Total Length of Stay:  LOS: 2 days  Events:  No events Up to chair most of day     BP (!) 122/97   Pulse (!) 125   Temp 99.2 F (37.3 C) (Oral)   Resp (!) 32   Ht 5\' 10"  (1.778 m)   Wt 116.9 kg   SpO2 95%   BMI 36.98 kg/m   CVP:  [12 mmHg-13 mmHg] 13 mmHg     . sodium chloride Stopped (04/30/19 1129)  . insulin 1.5 mL/hr at 04/30/19 [id number]  . lactated ringers    . lactated ringers    . norepinephrine (LEVOPHED) Adult infusion Stopped (05/01/19 0650)    I/O last 3 completed shifts: In: 5276.1 [P.O.:680; I.V.:4001.1; IV Piggyback:595.1] Out: [id number] [Urine:1900; Emesis/NG output:20; Chest Tube:1630]   CBC Latest Ref Rng & Units 05/01/2019 05/01/2019 04/30/2019  WBC 4.0 - 10.5 K/uL 16.0(H) 16.9(H) 18.2(H)  Hemoglobin 13.0 - 17.0 g/dL 8.3(L) 8.2(L) 8.5(L)  Hematocrit 39.0 - 52.0 % 26.2(L) 26.3(L) 27.0(L)  Platelets 150 - 400 K/uL 103(L) 93(L) 97(L)    BMP Latest Ref Rng & Units 05/01/2019 05/01/2019 04/30/2019  Glucose 70 - 99 mg/dL 154(H) 145(H) 159(H)  BUN 8 - 23 mg/dL 32(H) 29(H) 26(H)  Creatinine 0.61 - 1.24 mg/dL 1.43(H) 2.01(H) 1.60(H)  Sodium 135 - 145 mmol/L 137 139 137  Potassium 3.5 - 5.1 mmol/L 3.8 4.1 3.9  Chloride 98 - 111 mmol/L 105 105 106  CO2 22 - 32 mmol/L 21(L) 23 20(L)  Calcium 8.9 - 10.3 mg/dL 8.2(L) 8.0(L) 7.7(L)    ABG    Component Value Date/Time   PHART 7.328 (L) 04/30/2019 1326   PCO2ART 40.0 04/30/2019 1326   PO2ART 66.0 (L) 04/30/2019 1326   HCO3 20.9 04/30/2019 1326   TCO2 22 04/30/2019 1326   ACIDBASEDEF 5.0 (H) 04/30/2019 1326   O2SAT 65.0 05/01/2019 0257       Melodie Bouillon, MD 05/01/2019 5:28 PM

## 2019-05-02 LAB — CBC
HCT: 25.7 % — ABNORMAL LOW (ref 39.0–52.0)
Hemoglobin: 8.4 g/dL — ABNORMAL LOW (ref 13.0–17.0)
MCH: 32.4 pg (ref 26.0–34.0)
MCHC: 32.7 g/dL (ref 30.0–36.0)
MCV: 99.2 fL (ref 80.0–100.0)
Platelets: 103 10*3/uL — ABNORMAL LOW (ref 150–400)
RBC: 2.59 MIL/uL — ABNORMAL LOW (ref 4.22–5.81)
RDW: 13.8 % (ref 11.5–15.5)
WBC: 14.9 10*3/uL — ABNORMAL HIGH (ref 4.0–10.5)
nRBC: 0 % (ref 0.0–0.2)

## 2019-05-02 LAB — BASIC METABOLIC PANEL
Anion gap: 9 (ref 5–15)
BUN: 27 mg/dL — ABNORMAL HIGH (ref 8–23)
CO2: 24 mmol/L (ref 22–32)
Calcium: 8.1 mg/dL — ABNORMAL LOW (ref 8.9–10.3)
Chloride: 106 mmol/L (ref 98–111)
Creatinine, Ser: 1.06 mg/dL (ref 0.61–1.24)
GFR calc Af Amer: >60 mL/min (ref >60)
GFR calc non Af Amer: >60 mL/min (ref >60)
Glucose, Bld: 137 mg/dL — ABNORMAL HIGH (ref 70–99)
Potassium: 3.7 mmol/L (ref 3.5–5.1)
Sodium: 139 mmol/L (ref 135–145)

## 2019-05-02 LAB — GLUCOSE, CAPILLARY
Glucose-Capillary: 122 mg/dL — ABNORMAL HIGH (ref 70–99)
Glucose-Capillary: 123 mg/dL — ABNORMAL HIGH (ref 70–99)
Glucose-Capillary: 128 mg/dL — ABNORMAL HIGH (ref 70–99)
Glucose-Capillary: 129 mg/dL — ABNORMAL HIGH (ref 70–99)
Glucose-Capillary: 131 mg/dL — ABNORMAL HIGH (ref 70–99)
Glucose-Capillary: 150 mg/dL — ABNORMAL HIGH (ref 70–99)
Glucose-Capillary: 199 mg/dL — ABNORMAL HIGH (ref 70–99)

## 2019-05-02 LAB — MAGNESIUM: Magnesium: 2.4 mg/dL (ref 1.7–2.4)

## 2019-05-02 MED ORDER — METOPROLOL TARTRATE 25 MG PO TABS
25.0000 mg | ORAL_TABLET | Freq: Two times a day (BID) | ORAL | Status: DC
  Administered 2019-05-02 (×2): 25 mg via ORAL
  Filled 2019-05-02 (×2): qty 1

## 2019-05-02 MED ORDER — SODIUM CHLORIDE 0.9% FLUSH: 3.0000 mL | INTRAVENOUS | Status: DC | PRN

## 2019-05-02 MED ORDER — FUROSEMIDE 40 MG PO TABS
40.0000 mg | ORAL_TABLET | Freq: Two times a day (BID) | ORAL | Status: DC
  Administered 2019-05-02 – 2019-05-04 (×4): 40 mg via ORAL
  Filled 2019-05-02 (×4): qty 1

## 2019-05-02 MED ORDER — SODIUM CHLORIDE 0.9 % IV SOLN: 250.0000 mL | INTRAVENOUS | Status: DC | PRN

## 2019-05-02 MED ORDER — SODIUM CHLORIDE 0.9% FLUSH
3.0000 mL | Freq: Two times a day (BID) | INTRAVENOUS | Status: DC
  Administered 2019-05-02 – 2019-05-05 (×6): 3 mL via INTRAVENOUS

## 2019-05-02 MED ORDER — POTASSIUM CHLORIDE CRYS ER 20 MEQ PO TBCR
40.0000 meq | EXTENDED_RELEASE_TABLET | Freq: Two times a day (BID) | ORAL | Status: DC
  Administered 2019-05-02 (×2): 40 meq via ORAL
  Filled 2019-05-02 (×3): qty 2

## 2019-05-02 MED ORDER — ~~LOC~~ CARDIAC SURGERY, PATIENT & FAMILY EDUCATION: Freq: Once | Status: AC

## 2019-05-02 NOTE — Progress Notes (Signed)
[place] Mr. Durel Timian from 2 H via w/c.  Pt is awake, alert and oriented x 4.  Denies pain at this time.  VSS.  Placed on cardiac monitor, and registered with CCMD.  Orientation to room. Rt  Chest tubes to lateral and anterior chest with minimal serosanguinous drainage to 20 cm of pressure.  No dressing noted on CT site.  Rt chest incision with skin glue with large bruising noted over rt chest and shoulder.

## 2019-05-02 NOTE — Progress Notes (Signed)
Epicardial wires d/c'd without resistance.  No problems noted.  Will continue to closely monitor.

## 2019-05-02 NOTE — Progress Notes (Signed)
      [address]       [place] [address]             [phone]                 3 Days Post-Op Procedure(s) (LRB): MINIMALLY INVASIVE AORTIC VALVE REPLACEMENT (AVR) USING INSPIRIS VALVE SIZE 23MM (N/A) TRANSESOPHAGEAL ECHOCARDIOGRAM (TEE) (N/A)   Events: No events. Doing well Complains of some pain along abdomen _______________________________________________________________ Vitals: BP 122/89   Pulse (!) 122   Temp 98.1 F (36.7 C) (Oral)   Resp (!) 29   Ht 5\' 10"  (1.778 m)   Wt 115.7 kg   SpO2 99%   BMI 36.60 kg/m   - Neuro: alert NAD  - Cardiovascular: sinus tach  Drips: none.      - Pulm: EWOB, clear    ABG    Component Value Date/Time   PHART 7.328 (L) 04/30/2019 1326   PCO2ART 40.0 04/30/2019 1326   PO2ART 66.0 (L) 04/30/2019 1326   HCO3 20.9 04/30/2019 1326   TCO2 22 04/30/2019 1326   ACIDBASEDEF 5.0 (H) 04/30/2019 1326   O2SAT 65.0 05/01/2019 0257    - Abd: non-tender, slightly firm - Extremity: + edema  .Intake/Output      03/19 0701 - 03/20 0700 03/20 0701 - 03/21 0700   P.O. 200    I.V. (mL/kg) 110 (1)    IV Piggyback 100.1    Total Intake(mL/kg) 410.1 (3.5)    Urine (mL/kg/hr) 2245 (0.8)    Stool 0    Chest Tube 340    Total Output 2585    Net -2174.9         Stool Occurrence 1 x       _______________________________________________________________ Labs: CBC Latest Ref Rng & Units 05/02/2019 05/01/2019 05/01/2019  WBC 4.0 - 10.5 K/uL 14.9(H) 16.0(H) 16.9(H)  Hemoglobin 13.0 - 17.0 g/dL 8.4(L) 8.3(L) 8.2(L)  Hematocrit 39.0 - 52.0 % 25.7(L) 26.2(L) 26.3(L)  Platelets 150 - 400 K/uL 103(L) 103(L) 93(L)   CMP Latest Ref Rng & Units 05/02/2019 05/01/2019 05/01/2019  Glucose 70 - 99 mg/dL 137(H) 154(H) 145(H)  BUN 8 - 23 mg/dL 27(H) 32(H) 29(H)  Creatinine 0.61 - 1.24 mg/dL 1.06 1.43(H) 2.01(H)  Sodium 135 - 145 mmol/L 139 137 139  Potassium 3.5 - 5.1 mmol/L 3.7 3.8 4.1  Chloride 98 - 111 mmol/L 106 105 105  CO2  22 - 32 mmol/L 24 21(L) 23  Calcium 8.9 - 10.3 mg/dL 8.1(L) 8.2(L) 8.0(L)  Total Protein 6.5 - 8.1 g/dL - - -  Total Bilirubin 0.3 - 1.2 mg/dL - - -  Alkaline Phos 38 - 126 U/L - - -  AST 15 - 41 U/L - - -  ALT 0 - 44 U/L - - -    CXR: pending  _______________________________________________________________  Assessment and Plan: POD 3 s/p AVR with Inspiris valve  Neuro: pain controlled CV: sinus tach.  Adding BB today.  Will remove pacing wires if he tolerates this.  Will remove CVL.   Pulm: pulm toilet Renal: creat down.  Will continue diuresis. GI: on diet Heme: stable ID: afebrile Endo: SSI Dispo: floor today.  Melodie Bouillon, MD 05/02/2019 9:22 AM

## 2019-05-02 NOTE — Progress Notes (Signed)
Cardiac Rehab Phase 1- Patient declined ambulation at this time. Patient states he just walked about an hour ago and is tired, would like to take a nap now. Assisted in elevating his feet in the recliner. Will follow-up on Monday.  Sol Passer, MS, ACSM CEP 05/02/19 1324

## 2019-05-03 LAB — BASIC METABOLIC PANEL
Anion gap: 9 (ref 5–15)
BUN: 26 mg/dL — ABNORMAL HIGH (ref 8–23)
CO2: 28 mmol/L (ref 22–32)
Calcium: 8.4 mg/dL — ABNORMAL LOW (ref 8.9–10.3)
Chloride: 102 mmol/L (ref 98–111)
Creatinine, Ser: 0.94 mg/dL (ref 0.61–1.24)
GFR calc Af Amer: >60 mL/min (ref >60)
GFR calc non Af Amer: >60 mL/min (ref >60)
Glucose, Bld: 137 mg/dL — ABNORMAL HIGH (ref 70–99)
Potassium: 3.8 mmol/L (ref 3.5–5.1)
Sodium: 139 mmol/L (ref 135–145)

## 2019-05-03 LAB — GLUCOSE, CAPILLARY
Glucose-Capillary: 113 mg/dL — ABNORMAL HIGH (ref 70–99)
Glucose-Capillary: 113 mg/dL — ABNORMAL HIGH (ref 70–99)
Glucose-Capillary: 115 mg/dL — ABNORMAL HIGH (ref 70–99)
Glucose-Capillary: 126 mg/dL — ABNORMAL HIGH (ref 70–99)
Glucose-Capillary: 138 mg/dL — ABNORMAL HIGH (ref 70–99)
Glucose-Capillary: 152 mg/dL — ABNORMAL HIGH (ref 70–99)

## 2019-05-03 LAB — CBC
HCT: 25.3 % — ABNORMAL LOW (ref 39.0–52.0)
Hemoglobin: 8.1 g/dL — ABNORMAL LOW (ref 13.0–17.0)
MCH: 31.6 pg (ref 26.0–34.0)
MCHC: 32 g/dL (ref 30.0–36.0)
MCV: 98.8 fL (ref 80.0–100.0)
Platelets: 140 10*3/uL — ABNORMAL LOW (ref 150–400)
RBC: 2.56 MIL/uL — ABNORMAL LOW (ref 4.22–5.81)
RDW: 13.7 % (ref 11.5–15.5)
WBC: 11.8 10*3/uL — ABNORMAL HIGH (ref 4.0–10.5)
nRBC: 0 % (ref 0.0–0.2)

## 2019-05-03 MED ORDER — LACTULOSE 10 GM/15ML PO SOLN
20.0000 g | Freq: Once | ORAL | Status: AC
  Administered 2019-05-03: 20 g via ORAL
  Filled 2019-05-03: qty 30

## 2019-05-03 MED ORDER — DIPHENHYDRAMINE HCL 25 MG PO CAPS
25.0000 mg | ORAL_CAPSULE | Freq: Four times a day (QID) | ORAL | Status: DC | PRN
  Administered 2019-05-03: 25 mg via ORAL
  Filled 2019-05-03: qty 1

## 2019-05-03 MED ORDER — POTASSIUM CHLORIDE CRYS ER 20 MEQ PO TBCR
40.0000 meq | EXTENDED_RELEASE_TABLET | Freq: Two times a day (BID) | ORAL | Status: AC
  Administered 2019-05-03 (×2): 40 meq via ORAL
  Filled 2019-05-03: qty 2

## 2019-05-03 MED ORDER — METOPROLOL TARTRATE 25 MG PO TABS
37.5000 mg | ORAL_TABLET | Freq: Two times a day (BID) | ORAL | Status: DC
  Administered 2019-05-03 – 2019-05-04 (×3): 37.5 mg via ORAL
  Filled 2019-05-03 (×3): qty 1

## 2019-05-03 NOTE — Plan of Care (Signed)
  Problem: Education: Goal: Knowledge of General Education information will improve Description: Including pain rating scale, medication(s)/side effects and non-pharmacologic comfort measures Outcome: Progressing   Problem: Health Behavior/Discharge Planning: Goal: Ability to manage health-related needs will improve Outcome: Progressing   Problem: Clinical Measurements: Goal: Ability to maintain clinical measurements within normal limits will improve Outcome: Progressing Goal: Will remain free from infection Outcome: Progressing Goal: Diagnostic test results will improve Outcome: Progressing Goal: Respiratory complications will improve Outcome: Progressing Goal: Cardiovascular complication will be avoided Outcome: Progressing   Problem: Activity: Goal: Risk for activity intolerance will decrease Outcome: Progressing   Problem: Nutrition: Goal: Adequate nutrition will be maintained Outcome: Progressing   Problem: Coping: Goal: Level of anxiety will decrease Outcome: Progressing   Problem: Elimination: Goal: Will not experience complications related to bowel motility Outcome: Progressing Goal: Will not experience complications related to urinary retention Outcome: Progressing   Problem: Coping: Goal: Level of anxiety will decrease Outcome: Progressing   Problem: Elimination: Goal: Will not experience complications related to bowel motility Outcome: Progressing Goal: Will not experience complications related to urinary retention Outcome: Progressing   Problem: Pain Managment: Goal: General experience of comfort will improve Outcome: Progressing   Problem: Safety: Goal: Ability to remain free from injury will improve Outcome: Progressing   Problem: Skin Integrity: Goal: Risk for impaired skin integrity will decrease Outcome: Progressing   Problem: Education: Goal: Will demonstrate proper wound care and an understanding of methods to prevent future  damage Outcome: Progressing Goal: Knowledge of disease or condition will improve Outcome: Progressing Goal: Knowledge of the prescribed therapeutic regimen will improve Outcome: Progressing Goal: Individualized Educational Video(s) Outcome: Progressing   Problem: Education: Goal: Will demonstrate proper wound care and an understanding of methods to prevent future damage Outcome: Progressing Goal: Knowledge of disease or condition will improve Outcome: Progressing Goal: Knowledge of the prescribed therapeutic regimen will improve Outcome: Progressing Goal: Individualized Educational Video(s) Outcome: Progressing   Problem: Activity: Goal: Risk for activity intolerance will decrease Outcome: Progressing   Problem: Cardiac: Goal: Will achieve and/or maintain hemodynamic stability Outcome: Progressing   Problem: Clinical Measurements: Goal: Postoperative complications will be avoided or minimized Outcome: Progressing   Problem: Respiratory: Goal: Respiratory status will improve Outcome: Progressing   Problem: Skin Integrity: Goal: Wound healing without signs and symptoms of infection Outcome: Progressing Goal: Risk for impaired skin integrity will decrease Outcome: Progressing   Problem: Urinary Elimination: Goal: Ability to achieve and maintain adequate renal perfusion and functioning will improve Outcome: Progressing

## 2019-05-03 NOTE — Progress Notes (Addendum)
      [address]       [place] [address]             [phone]        4 Days Post-Op Procedure(s) (LRB): MINIMALLY INVASIVE AORTIC VALVE REPLACEMENT (AVR) USING INSPIRIS VALVE SIZE 23MM (N/A) TRANSESOPHAGEAL ECHOCARDIOGRAM (TEE) (N/A)  Subjective: Patient passing flatus but no bowel movement yet. He has some pain at chest tube sites.  Objective: Vital signs in last 24 hours: Temp:  [97.5 F (36.4 C)-98.5 F (36.9 C)] 97.5 F (36.4 C) (03/21 0746) Pulse Rate:  [100-127] 115 (03/21 0746) Cardiac Rhythm: Sinus tachycardia (03/20 1900) Resp:  [19-31] 30 (03/21 0746) BP: (106-134)/(66-93) 120/76 (03/21 0746) SpO2:  [96 %-100 %] 98 % (03/21 0747) Weight:  [114.2 kg] 114.2 kg (03/21 0400)   Pre op weight 106.6 kg Current Weight  05/03/19 114.2 kg      Intake/Output from previous day: 03/20 0701 - 03/21 0700 In: 360 [P.O.:360] Out: 1800 [Urine:1700; Chest Tube:100]   Physical Exam:  Cardiovascular: Tachycardic Pulmonary: Diminished bibasilar breath sounds Abdomen: Soft, non tender, bowel sounds present. Extremities: Bilateral lower extremity edema. Wounds: Clean and dry.  No erythema or signs of infection.  Lab Results: CBC: Recent Labs    05/02/19 0254 05/03/19 0255  WBC 14.9* 11.8*  HGB 8.4* 8.1*  HCT 25.7* 25.3*  PLT 103* 140*   BMET:  Recent Labs    05/02/19 0254 05/03/19 0255  NA 139 139  K 3.7 3.8  CL 106 102  CO2 24 28  GLUCOSE 137* 137*  BUN 27* 26*  CREATININE 1.06 0.94  CALCIUM 8.1* 8.4*    PT/INR:  Lab Results  Component Value Date   INR 1.3 (H) 04/29/2019   INR 1.0 04/27/2019   ABG:  INR: Will add last result for INR, ABG once components are confirmed Will add last 4 CBG results once components are confirmed  Assessment/Plan:  1. CV - ST with HR 110's this am. On Lopressor 25 mg bid so will increase for better HR control.  2.  Pulmonary - On 2 liters of oxygen via [place]. Wean as able. Continue Singulair.  Chest tubes recorded 300 cc for 24 hours. Will mark this am for more accurate drainage results. No CXR ordered for this am. Check CXR in am. Chest tubes to remain for now. Encourage incentive spirometer. 3. Volume Overload - On Lasix 40 mg po bid 4.  Expected post op blood loss anemia - H and H this am stable at 8.1 and 25.3 5. Mild thrombocytopenia-platelets increased to 140,000 6. Supplement potassium 7. DM-CBGs 138/126/152. Pre op HGA1C 6.2. On scheduled Insulin. On Metformin prior to surgery and will restart at discharge. 8. LOC for constipation 9. Benadryl PRN sleep  Erik M ZimmermanPA-C 05/03/2019,8:13 AM   Agree with above Remains tachy.  Increasing BB Will keep CT given output for a minimal approach Continue PT  Erik Allen

## 2019-05-04 ENCOUNTER — Inpatient Hospital Stay (HOSPITAL_COMMUNITY): Payer: BC Managed Care – PPO

## 2019-05-04 LAB — GLUCOSE, CAPILLARY
Glucose-Capillary: 106 mg/dL — ABNORMAL HIGH (ref 70–99)
Glucose-Capillary: 172 mg/dL — ABNORMAL HIGH (ref 70–99)
Glucose-Capillary: 102 mg/dL — ABNORMAL HIGH (ref 70–99)
Glucose-Capillary: 117 mg/dL — ABNORMAL HIGH (ref 70–99)

## 2019-05-04 MED ORDER — POTASSIUM CHLORIDE ER 10 MEQ PO TBCR
20.0000 meq | EXTENDED_RELEASE_TABLET | Freq: Two times a day (BID) | ORAL | Status: DC
  Administered 2019-05-04 – 2019-05-05 (×3): 20 meq via ORAL
  Filled 2019-05-04 (×7): qty 2

## 2019-05-04 MED ORDER — METOPROLOL TARTRATE 50 MG PO TABS
50.0000 mg | ORAL_TABLET | Freq: Two times a day (BID) | ORAL | Status: DC
  Administered 2019-05-04 – 2019-05-05 (×2): 50 mg via ORAL
  Filled 2019-05-04 (×2): qty 1

## 2019-05-04 MED ORDER — INSULIN ASPART 100 UNIT/ML ~~LOC~~ SOLN: 0.0000 [IU] | Freq: Three times a day (TID) | SUBCUTANEOUS | Status: DC

## 2019-05-04 MED ORDER — FUROSEMIDE 10 MG/ML IJ SOLN: 40.0000 mg | Freq: Two times a day (BID) | INTRAMUSCULAR | Status: DC

## 2019-05-04 MED ORDER — FUROSEMIDE 10 MG/ML IJ SOLN
40.0000 mg | Freq: Two times a day (BID) | INTRAMUSCULAR | Status: DC
  Administered 2019-05-04 – 2019-05-05 (×2): 40 mg via INTRAVENOUS
  Filled 2019-05-04 (×2): qty 4

## 2019-05-04 NOTE — Progress Notes (Signed)
CARDIAC REHAB PHASE I   PRE:  Rate/Rhythm: 97 SR  BP:  Supine:   Sitting: 125/79  Standing:    SaO2: 98% 1L  MODE:  Ambulation: 300 ft   POST:  Rate/Rhythm: 120 ST  94 with rest  BP:  Supine:    Sitting: 130/84  Standing:    SaO2: 94%RA hall and 96% in room.   Left off oxygen 0934-1008 Pt assisted to bathroom and then he stood at sink to brush his teeth. Pt walked 300 ft on RA with rolling walker with slow steady gait. Tolerated well except HR to 120. To recliner after walk .   Encouraged two more walks today.    Graylon Good, RN BSN  05/04/2019 10:02 AM

## 2019-05-04 NOTE — Progress Notes (Addendum)
      [address]       ,[place] [address]             [phone]      5 Days Post-Op Procedure(s) (LRB): MINIMALLY INVASIVE AORTIC VALVE REPLACEMENT (AVR) USING INSPIRIS VALVE SIZE 23MM (N/A) TRANSESOPHAGEAL ECHOCARDIOGRAM (TEE) (N/A) Subjective: Feels okay with back pain between the shoulders.    Objective: Vital signs in last 24 hours: Temp:  [97.5 F (36.4 C)-98.6 F (37 C)] 97.6 F (36.4 C) (03/22 0455) Pulse Rate:  [105-115] 105 (03/21 1900) Cardiac Rhythm: Sinus tachycardia (03/21 1905) Resp:  [20-30] 20 (03/21 1900) BP: (118-135)/(76-85) 127/85 (03/22 0455) SpO2:  [95 %-100 %] 100 % (03/21 1958) Weight:  [112.8 kg] 112.8 kg (03/22 0653)     Intake/Output from previous day: 03/21 0701 - 03/22 0700 In: 720 [P.O.:720] Out: 615 [Urine:415; Chest Tube:200] Intake/Output this shift: No intake/output data recorded.  General appearance: alert, cooperative and no distress Heart: sinus tachycardia Lungs: clear to auscultation bilaterally Abdomen: soft, non-tender; bowel sounds normal; no masses,  no organomegaly Extremities: 1+ pitting edema in the lower ext Wound: clean and dry  Lab Results: Recent Labs    05/02/19 0254 05/03/19 0255  WBC 14.9* 11.8*  HGB 8.4* 8.1*  HCT 25.7* 25.3*  PLT 103* 140*   BMET:  Recent Labs    05/02/19 0254 05/03/19 0255  NA 139 139  K 3.7 3.8  CL 106 102  CO2 24 28  GLUCOSE 137* 137*  BUN 27* 26*  CREATININE 1.06 0.94  CALCIUM 8.1* 8.4*    PT/INR: No results for input(s): LABPROT, INR in the last 72 hours. ABG    Component Value Date/Time   PHART 7.328 (L) 04/30/2019 1326   HCO3 20.9 04/30/2019 1326   TCO2 22 04/30/2019 1326   ACIDBASEDEF 5.0 (H) 04/30/2019 1326   O2SAT 65.0 05/01/2019 0257   CBG (last 3)  Recent Labs    05/03/19 1622 05/03/19 2121 05/04/19 0457  GLUCAP 113* 115* 106*    Assessment/Plan: S/P Procedure(s) (LRB): MINIMALLY INVASIVE AORTIC VALVE REPLACEMENT (AVR) USING  INSPIRIS VALVE SIZE 23MM (N/A) TRANSESOPHAGEAL ECHOCARDIOGRAM (TEE) (N/A)  1. CV - ST with HR 110's this am. Continue asa. On Lopressor 37.5 mg bid-may need to increase. 2.  Pulmonary - On 1 liters of oxygen via [place]. Wean as able. Continue Singulair. Chest tubes recorded 200 cc for 24 hours. Low lung volumes on CXR with atelectasis.  3. Volume Overload - On Lasix 40 mg po bid, continue. 4.  Expected post op blood loss anemia - H and H this am stable at 8.3 and 26.2 5. Mild thrombocytopenia-platelets increased to 140,000 6. Supplement potassium 7. DM-CBGs well controlled. Pre op HGA1C 6.2. On scheduled Insulin. On Metformin prior to surgery and will restart at discharge. 8. LOC for constipation 9. Benadryl PRN sleep  Plan: Should be okay to remove chest tubes today. His drainage was about 125cc from Donielle's mark around 8am yesterday. He is encouraged to use his incentive spirometer hourly. He is walking in the halls. Wean oxygen as tolerated. Hopefully ready for discharge in 1-2 days.    LOS: 5 days    Elgie Collard 05/04/2019   I have seen and examined the patient and agree with the assessment and plan as outlined.  D/C chest tubes.  Still need diuresis.  Mobilize.  Wean O2.  Possible d/c home 1-2 days  Rexene Alberts, MD 05/04/2019 8:43 AM

## 2019-05-04 NOTE — Progress Notes (Signed)
Removed two right sided chest tubes per order. Patient premedicated with pain medication. RN Erasmo Downer assisted and tied sutures, gauze dressing applied. Patient tolerated procedure. Patient aware to call if any shortness of breath or feels drainage at site. Pt resting with call bell within reach.  Will continue to monitor.

## 2019-05-05 ENCOUNTER — Inpatient Hospital Stay (HOSPITAL_COMMUNITY): Payer: BC Managed Care – PPO

## 2019-05-05 ENCOUNTER — Encounter: Payer: Self-pay | Admitting: Cardiology

## 2019-05-05 LAB — GLUCOSE, CAPILLARY
Glucose-Capillary: 107 mg/dL — ABNORMAL HIGH (ref 70–99)
Glucose-Capillary: 110 mg/dL — ABNORMAL HIGH (ref 70–99)
Glucose-Capillary: 126 mg/dL — ABNORMAL HIGH (ref 70–99)

## 2019-05-05 MED ORDER — METOPROLOL TARTRATE 50 MG PO TABS: 50.0000 mg | ORAL_TABLET | Freq: Two times a day (BID) | ORAL | 1 refills | Status: DC

## 2019-05-05 MED ORDER — GUAIFENESIN ER 600 MG PO TB12
1200.0000 mg | ORAL_TABLET | Freq: Two times a day (BID) | ORAL | Status: DC
  Administered 2019-05-05: 1200 mg via ORAL
  Filled 2019-05-05: qty 2

## 2019-05-05 MED ORDER — OXYCODONE HCL 5 MG PO TABS: 5.0000 mg | ORAL_TABLET | Freq: Four times a day (QID) | ORAL | 0 refills | Status: DC | PRN

## 2019-05-05 MED ORDER — FUROSEMIDE 40 MG PO TABS: 80.0000 mg | ORAL_TABLET | Freq: Two times a day (BID) | ORAL | 1 refills | Status: DC

## 2019-05-05 MED ORDER — ASPIRIN 325 MG PO TBEC: 325.0000 mg | DELAYED_RELEASE_TABLET | Freq: Every day | ORAL | 0 refills | Status: DC

## 2019-05-05 MED ORDER — FUROSEMIDE 10 MG/ML IJ SOLN
40.0000 mg | Freq: Once | INTRAMUSCULAR | Status: AC
  Administered 2019-05-05: 40 mg via INTRAVENOUS
  Filled 2019-05-05: qty 4

## 2019-05-05 MED ORDER — POTASSIUM CHLORIDE ER 20 MEQ PO TBCR: 40.0000 meq | EXTENDED_RELEASE_TABLET | Freq: Two times a day (BID) | ORAL | 1 refills | Status: DC

## 2019-05-05 MED ORDER — GUAIFENESIN ER 600 MG PO TB12: 1200.0000 mg | ORAL_TABLET | Freq: Two times a day (BID) | ORAL | Status: DC

## 2019-05-05 MED ORDER — FUROSEMIDE 10 MG/ML IJ SOLN: 80.0000 mg | Freq: Two times a day (BID) | INTRAMUSCULAR | Status: DC

## 2019-05-05 NOTE — Progress Notes (Signed)
CARDIAC REHAB PHASE I   PRE:  Rate/Rhythm: 99SR  BP:  Supine:   Sitting: 131/81  Standing:    SaO2: 94%RA  MODE:  Ambulation: 470 ft   POST:  Rate/Rhythm: 115 ST  BP:  Supine:   Sitting: 132/72  Standing:    SaO2: 95%RA 0952-1038 Pt walked 470 ft on RA with rolling walker independently. Stated he has walker at home to use if needed. Appeared a little SOB but he stated he felt that it was the mask and he did not feel any more SOB than yesterday. To recliner after walk. Education completed with pt. Discussed restrictions, IS, gave diabetic and heart healthy diets (also encouraged low sodium), and gave walking instructions for ex. Wrote down how to view discharge video if interested. Discussed CRP 2. Pt not interested in APP but is interested in referral. Pt voiced understanding of ed.   Graylon Good, RN BSN  05/05/2019 10:32 AM

## 2019-05-05 NOTE — Progress Notes (Signed)
      [address]       [place] [address]             [phone]      6 Days Post-Op Procedure(s) (LRB): MINIMALLY INVASIVE AORTIC VALVE REPLACEMENT (AVR) USING INSPIRIS VALVE SIZE 23MM (N/A) TRANSESOPHAGEAL ECHOCARDIOGRAM (TEE) (N/A) Subjective: Feels good this morning. No issues overnight.   Objective: Vital signs in last 24 hours: Temp:  [98.4 F (36.9 C)-99 F (37.2 C)] 98.4 F (36.9 C) (03/23 0454) Pulse Rate:  [92-109] 102 (03/23 0454) Cardiac Rhythm: Sinus tachycardia (03/22 1900) Resp:  [20-32] 32 (03/23 0454) BP: (121-142)/(74-85) 142/80 (03/23 0454) SpO2:  [92 %-99 %] 93 % (03/23 0454) Weight:  CB:[id number] kg] 112 kg (03/23 0502)     Intake/Output from previous day: 03/22 0701 - 03/23 0700 In: 203 [P.O.:200; I.V.:3] Out: 720 [Urine:720] Intake/Output this shift: No intake/output data recorded.  General appearance: alert, cooperative and no distress Heart: sinus tachycardia Lungs: clear to auscultation bilaterally Abdomen: soft, non-tender; bowel sounds normal; no masses,  no organomegaly Extremities: 1+ non pitting edema Wound: clean and dry with surrounding ecchymosis  Lab Results: Recent Labs    05/03/19 0255  WBC 11.8*  HGB 8.1*  HCT 25.3*  PLT 140*   BMET:  Recent Labs    05/03/19 0255  NA 139  K 3.8  CL 102  CO2 28  GLUCOSE 137*  BUN 26*  CREATININE 0.94  CALCIUM 8.4*    PT/INR: No results for input(s): LABPROT, INR in the last 72 hours. ABG    Component Value Date/Time   PHART 7.328 (L) 04/30/2019 1326   HCO3 20.9 04/30/2019 1326   TCO2 22 04/30/2019 1326   ACIDBASEDEF 5.0 (H) 04/30/2019 1326   O2SAT 65.0 05/01/2019 0257   CBG (last 3)  Recent Labs    05/04/19 2053 05/05/19 0021 05/05/19 0604  GLUCAP 117* 110* 107*    Assessment/Plan: S/P Procedure(s) (LRB): MINIMALLY INVASIVE AORTIC VALVE REPLACEMENT (AVR) USING INSPIRIS VALVE SIZE 23MM (N/A) TRANSESOPHAGEAL ECHOCARDIOGRAM (TEE) (N/A)   1. CV -NSR  in the 90s. Continue asa. On Lopressor 50mg  bid-continue 2. Pulmonary - Tolerating room air with good oxygen saturation. Wean as able. Continue Singulair. Chest tubes removed yesterday without issue. CXR is stable this morning without obvious pneumo 3. Volume Overload -switched to lasix IV BID yesterday- weight did not really change much. Remains about 6kg over baseline.  4.Expected post opblood loss anemia -H and H this am stable at 8.1 and 25.3 5. Mild thrombocytopenia-platelets increased to 140,000 6. DM-CBGs well controlled. Pre op HGA1C 6.2. On scheduled Insulin. On Metformin prior to surgery and will restart at discharge. 7. Benadryl PRN sleep  Plan: The patient is asking to go home today. This was the original plan, however may need another day of IV lasix before switching back to oral. Will discuss with Dr. Roxy Manns. Discharge later today vs tomorrow morning.    LOS: 6 days    Elgie Collard 05/05/2019

## 2019-05-05 NOTE — Progress Notes (Signed)
  Merla Riches, RN  Registered Nurse    Progress Notes      Signed  Date of Service:  05/05/2019  9:27 AM          Signed         Show:Clear all [x] Manual[x] Template[] Copied  Added by: [x] Merla Riches, RN  [] Hover for details [facility] concerning MEWs with elevated RR and elevated HR. This is not a change from previously. Patient appears to be having more difficulty getting phlegm out. PA will order mucinex to help thin. Patient encouraged to continue using incentive spirometer. Pt resting with call bell within reach.  Will continue to monitor.

## 2019-05-05 NOTE — Progress Notes (Signed)
Pt given discharge instructions, medication lists, follow up appointments, and when to call the doctor.  Pt verbalizes understanding. Patient has called friend to pick him up for discharge. Patient currently with frequent urination from IV lasix and stool from previous laxatives. Patient states he is eager to go home. Due to urgency at this time will wait until friend is close before getting dressed and removing cardiac monitor. Pt resting with call bell within reach.  Will continue to monitor.

## 2019-05-05 NOTE — Telephone Encounter (Signed)
This encounter was created in error - please disregard.

## 2019-05-05 NOTE — Telephone Encounter (Signed)
Sent completed forms to [employer] via interoffice. 

## 2019-05-05 NOTE — Progress Notes (Signed)
Called PA Tessa concerning MEWs with elevated RR and elevated HR. This is not a change from previously. Patient appears to be having more difficulty getting phlegm out. PA will order mucinex to help thin. Patient encouraged to continue using incentive spirometer. Pt resting with call bell within reach.  Will continue to monitor.

## 2019-05-06 ENCOUNTER — Telehealth: Payer: Self-pay

## 2019-05-06 NOTE — Telephone Encounter (Signed)
Pt called office this morning w/ complaint of shortness of breath, s/p mini AVR on 04/29/19. He denies pain and states the shortness of breath is intermittent and worse while at rest. He states he is wondering if this is d/t anxiety, as he was just discharged home from the hospital yesterday. He denies respiratory distress and says he feels better than he did last night, at which time he had difficulty sleeping d/t the shortness of breath. Home pulse oximeter reads 94% on room air, HR 107. Offered OV w/ PA, but pt states he wants to wait and see if he feels better throughout the day. Advised him to continue monitoring his SpO2 and HR and to report if there is no improvement or worsening of symptoms; also instructed to seek urgent medical attention for respiratory distress and/or chest pain.

## 2019-05-07 ENCOUNTER — Telehealth: Payer: Self-pay

## 2019-05-07 NOTE — Telephone Encounter (Addendum)
Called to f/u with pt regarding his shortness of breath yesterday. He states he has been feeling well and denies shortness of breath. Reports his current O2 sat is 95% on RA and HR is 96 bpm. His peripheral edema continues to decrease, and he reports losing 6 lbs since yesterday. Pt is scheduled for f/u visit w/ PA on 05/18/19. Instructed him to notify [facility] of any problems or concerns.

## 2019-05-08 ENCOUNTER — Ambulatory Visit: Payer: 59 | Admitting: Family Medicine

## 2019-05-08 ENCOUNTER — Other Ambulatory Visit: Payer: Self-pay | Admitting: Physician Assistant

## 2019-05-08 DIAGNOSIS — E119 Type 2 diabetes mellitus without complications: Secondary | ICD-10-CM

## 2019-05-08 NOTE — Progress Notes (Unsigned)
      [address]       [place] [address]             [phone]      I spoke to Mr. Praska this afternoon regarding his dizziness/lightheadedness, shortness of breath, and anxiety. He states the dizziness occurs mostly in the morning after waking and he feels like he has a band around his head. He states his shortness of breath improved with his albuterol inhaler. He is also on Advair BID for his asthma. He is only using his rescue inhaler about once a day. He took his vitals at home using a home automatic BP cuff. His blood pressure was 129/70 per the cuff and HR was 111. He is currently on Metoprolol 50mg  BID. He states that his weight dropped 14 lbs in 2 days according to his scale. The patient has noticed a difference in the way his clothes and shoes fit. He states he is "98% back to his baseline weight".   Plan: Continue albuterol and Advair for asthma. Decrease Lasix to 40mg  BID and Potassium to 20MEQ BID. Continue daily weights and daily BP readings. Would not need a repeat CXR in the office since his CXR from 3 days ago was fine and patient seems dry not fluid overloaded. If symptoms worsen over the weekend he is to give our office a call and phone call will go to either myself or Dr. Roxy Manns. Patient agreed with the plan and understands the medication changes made during the telephone encounter.

## 2019-05-09 LAB — ECHO INTRAOPERATIVE TEE
AV Mean grad: 23 mmHg
Ao-asc: 2.9 cm
Height: 70 in
LVOT diameter: 22 mm
Mean grad: 2 mmHg
STJ: 2 cm
Sinus: 2.9 cm
Weight: 3760 oz

## 2019-05-11 ENCOUNTER — Ambulatory Visit: Payer: Self-pay | Admitting: Surgical

## 2019-05-11 ENCOUNTER — Other Ambulatory Visit: Payer: Self-pay

## 2019-05-11 ENCOUNTER — Ambulatory Visit
Admission: RE | Admit: 2019-05-11 | Discharge: 2019-05-11 | Disposition: A | Discharge status: Home / Self Care | Payer: BC Managed Care – PPO | Source: Ambulatory Visit | Attending: Surgical | Admitting: Surgical

## 2019-05-11 ENCOUNTER — Telehealth: Payer: Self-pay

## 2019-05-11 VITALS — BP 149/79 | HR 124 | Temp 97.9°F | Resp 24 | Ht 70.0 in | Wt 226.0 lb

## 2019-05-11 DIAGNOSIS — E860 Dehydration: Secondary | ICD-10-CM

## 2019-05-11 DIAGNOSIS — Z736 Limitation of activities due to disability: Secondary | ICD-10-CM

## 2019-05-11 DIAGNOSIS — I350 Nonrheumatic aortic (valve) stenosis: Secondary | ICD-10-CM

## 2019-05-11 NOTE — Progress Notes (Signed)
[address]       [place] [address]             [place] Record #[id number] Date of Birth: 07/09/1955  Referring: Ria Bush, MD Primary Care: Ria Bush, MD Primary Cardiologist: Kate Sable, MD   Chief Complaint:   POST OP FOLLOW UP         CARDIOTHORACIC SURGERY OPERATIVE NOTE  Date of Procedure:                04/29/2019  Preoperative Diagnosis:      Severe Aortic Stenosis   Postoperative Diagnosis:    Same   Procedure:        Minimally Invasive Aortic Valve Replacement Right Anterior Mini-thoracotomy Edwards Inspiris Resilia Stented Bovine Pericardial Tissue Valve (size 18m, catalog #[id number], serial #[id number] Plating of right 2nd and 3rd costal cartilages (KLS titanium plates)              Surgeon:        CValentina Gu ORoxy Manns MD  Assistant:       DNani Skillern PA-C  Anesthesia:    CLaurie Panda MD  Operative Findings: ? Severe calcific aortic stenosis ? Normal left ventricular systolic function ? Moderate left ventricular hypertrophy with diastolic dysfunction          History of Present Illness:     The patient is a 65year old male status post the above described procedure.  He called the office last week multiple times in also today complaining of  multiple symptoms including dizziness, weakness and in general feeling poorly.  He also describes polydipsia.  He has a mild nonproductive cough.  He denies any wheezing.  He has not had any fevers, chills or other significant symptoms worrisome for infection.  He has had no significant difficulties with his incisions.  He states his blood sugars have been under good good control typically in the  115-130 range.  He has been drinking extra fluids including Pedialyte.  He states that his weight is below his preoperative level and he no longer has any peripheral edema which was fairly significant at time of  discharge.  He denies any significant chest pain but has noted his heart rate is elevated at times.  In particular with activity but he has not noted any irregularity to the rhythm.     Past Medical History:  Diagnosis Date  . Aortic stenosis 06/21/2014   Echo 01/2018: Mild LVH, normal sys fxn EF 55-60%, G1DD, moderate AS with mildly dilated LA. rec yearly echocardiogram  . Asthma 09/13/99  . BENIGN PROSTATIC HYPERTROPHY 05/14/2003  . CAP (community acquired pneumonia) 01/31/2015  . Diabetes mellitus without complication ([place]   . Disorder of vocal cord 2012   h/o thrush on vocal cords per patient  . GERD (gastroesophageal reflux disease) 10/13/01   ENT eval for GERD and thrush 2011  . History of kidney stones   . HTN (hypertension)   . Hyperlipemia 10/13/97  . S/P minimally invasive aortic valve replacement with bioprosthetic valve 04/29/2019   Edwards Inspiris Resilia stented bovine pericardial tissue valve (size 23 mm)  . Seasonal allergic rhinitis    worse in spring  . Systolic murmur   . Transaminitis 2014   presumed fatty liver without [place] normal iron and viral hep panel     Social History   Tobacco Use  Smoking Status Never  Smoker  Smokeless Tobacco Never Used    Social History   Substance and Sexual Activity  Alcohol Use Yes  . Alcohol/week: 1.0 - 2.0 standard drinks  . Types: 1 - 2 Standard drinks or equivalent per week   Comment: occassionally     Allergies  Allergen Reactions  . Ace Inhibitors Cough  . Codeine Other (See Comments)    REACTION: DIZZY / HEADACHE  . Tramadol Nausea And Vomiting    Current Outpatient Medications  Medication Sig Dispense Refill  . Accu-Chek FastClix Lancets MISC Use as directed to check blood sugar daily 100 each 1  . ACCU-CHEK GUIDE test strip USE AS INSTRUCTED 100 strip 1  . albuterol (PROVENTIL) (2.5 MG/3ML) 0.083% nebulizer solution USE 1 VIAL VIA NEBULIZER EVERY 6 HOURS AS NEEDED (Patient taking differently: Take 2.5 mg  by nebulization every 6 (six) hours as needed for wheezing or shortness of breath. USE 1 VIAL VIA NEBULIZER EVERY 6 HOURS AS NEEDE) 150 mL 3  . albuterol (VENTOLIN HFA) 108 (90 Base) MCG/ACT inhaler INHALE 2 PUFFS INTO THE LUNGS EVERY 6 HOURS AS NEEDED FOR WHEEZING OR SHORTNESS OF BREATH 18 g 4  . aspirin EC 325 MG EC tablet Take 1 tablet (325 mg total) by mouth daily. 30 tablet 0  . Blood Glucose Monitoring Suppl (ACCU-CHEK GUIDE) w/Device KIT 1 each by Does not apply route as directed. 1 kit 0  . Cholecalciferol (VITAMIN D) 50 MCG (2000 UT) CAPS Take 2,000 Units by mouth in the morning and at bedtime.     . cyanocobalamin (V-R VITAMIN B-12) 500 MCG tablet Take 1 tablet (500 mcg total) by mouth daily.    . finasteride (PROSCAR) 5 MG tablet TAKE 1 TABLET BY MOUTH EVERY DAY (Patient taking differently: Take 5 mg by mouth daily. TAKE 1 TABLET BY MOUTH EVERY DAY) 90 tablet 0  . Fluticasone-Salmeterol (ADVAIR DISKUS) 250-50 MCG/DOSE AEPB INHALE 1 PUFF BY MOUTH TWICE DAILY. RINSE MOUTH AFTER USE 180 each 1  . furosemide (LASIX) 40 MG tablet Take 2 tablets (80 mg total) by mouth 2 (two) times daily for 7 days. then take '40mg'$  BID until we see you in follow-up. 90 tablet 1  . guaiFENesin (MUCINEX) 600 MG 12 hr tablet Take 2 tablets (1,200 mg total) by mouth 2 (two) times daily.    . metFORMIN (GLUCOPHAGE) 500 MG tablet TAKE 1 TABLET (500 MG TOTAL) BY MOUTH 2 (TWO) TIMES DAILY WITH A MEAL. 180 tablet 0  . metoprolol tartrate (LOPRESSOR) 50 MG tablet Take 1 tablet (50 mg total) by mouth 2 (two) times daily. 60 tablet 1  . montelukast (SINGULAIR) 10 MG tablet TAKE 1 TABLET BY MOUTH EVERY NIGHT AT BEDTIME (Patient taking differently: Take 10 mg by mouth at bedtime. ) 90 tablet 2  . oxyCODONE (OXY IR/ROXICODONE) 5 MG immediate release tablet Take 1 tablet (5 mg total) by mouth every 6 (six) hours as needed for severe pain. 30 tablet 0  . pantoprazole (PROTONIX) 40 MG tablet Take 40 mg by mouth at bedtime.    .  potassium chloride 20 MEQ TBCR Take 40 mEq by mouth 2 (two) times daily for 7 days. Then take 20MEQ (1 tab) by mouth BID until we see you in follow-up. 90 tablet 1  . simvastatin (ZOCOR) 80 MG tablet TAKE 1 TABLET BY MOUTH EVERYDAY AT BEDTIME (Patient taking differently: Take 80 mg by mouth at bedtime. ) 90 tablet 3  . valsartan-hydrochlorothiazide (DIOVAN-HCT) 160-25 MG tablet Take 1 tablet by  mouth daily. 90 tablet 1   No current facility-administered medications for this visit.       Physical Exam: There were no vitals taken for this visit.  General appearance: alert, cooperative and no distress Heart: regular rate and rhythm Lungs: diminished breath sounds right base, rales none and no wheeze Abdomen: benign Extremities: no edema Wound: incis healing well, I removed CT sutures without difficulty   Diagnostic Studies & Laboratory data:     Recent Radiology Findings:   DG Chest 2 View  Result Date: 05/11/2019 CLINICAL DATA:  Initial evaluation for acute shortness of breath. History of recent CABG with AVR on 04/29/2019. EXAM: CHEST - 2 VIEW COMPARISON:  Prior radiograph from 05/05/2019. FINDINGS: Mild cardiomegaly. Mediastinal silhouette within normal limits. Postoperative changes from prior CABG with aortic valvular replacement. Lungs hypoinflated. Spur mild linear left basilar subsegmental atelectasis, improved from previous. No other focal airspace disease. Small layering right pleural effusion. No pulmonary edema. No pneumothorax. No acute osseous abnormality. IMPRESSION: 1. Mild left basilar subsegmental atelectasis, improved from previous. 2. Small layering right pleural effusion. Electronically Signed   By: Jeannine Boga M.D.   On: 05/11/2019 15:01      Recent Lab Findings: Lab Results  Component Value Date   WBC 11.8 (H) 05/03/2019   HGB 8.1 (L) 05/03/2019   HCT 25.3 (L) 05/03/2019   PLT 140 (L) 05/03/2019   GLUCOSE 137 (H) 05/03/2019   CHOL 158 08/14/2018    TRIG 151.0 (H) 08/14/2018   HDL 55.10 08/14/2018   LDLDIRECT 94.0 07/09/2017   LDLCALC 72 08/14/2018   ALT 72 (H) 04/27/2019   AST 46 (H) 04/27/2019   NA 139 05/03/2019   K 3.8 05/03/2019   CL 102 05/03/2019   CREATININE 0.94 05/03/2019   BUN 26 (H) 05/03/2019   CO2 28 05/03/2019   TSH 1.64 11/05/2016   INR 1.3 (H) 04/29/2019   HGBA1C 6.2 (H) 04/27/2019      Assessment / Plan: The patient appears to be clinically dehydrated.  I have encouraged him to increase oral fluid intake and to stop all Lasix and potassium at this time.  He will keep [place] informed as to his progress and he is a scheduled appointment for next Monday.  We can see him again prior to that if he has any further difficulties.      Medication Changes: No orders of the defined types were placed in this encounter.     John Giovanni, PA-C 05/11/2019 3:05 PM

## 2019-05-11 NOTE — Telephone Encounter (Signed)
Pt calls office to report continued dizziness after PA made med adjustments on 05/08/19. All VS are WNL w/ exception to slightly elevated HR @ 107 bpm, which is typical for pt. Spoke w/ Dr. Roxy Manns; suggests PA visit today.

## 2019-05-11 NOTE — Patient Instructions (Signed)
Stop lasix and potassium.

## 2019-05-14 ENCOUNTER — Other Ambulatory Visit: Payer: Self-pay | Admitting: Thoracic Surgery (Cardiothoracic Vascular Surgery)

## 2019-05-14 DIAGNOSIS — Z953 Presence of xenogenic heart valve: Secondary | ICD-10-CM

## 2019-05-14 NOTE — Telephone Encounter (Signed)
[employer] forms returned, place in nurse box

## 2019-05-18 ENCOUNTER — Other Ambulatory Visit: Payer: Self-pay

## 2019-05-18 ENCOUNTER — Ambulatory Visit
Admission: RE | Admit: 2019-05-18 | Discharge: 2019-05-18 | Disposition: A | Discharge status: Home / Self Care | Payer: BC Managed Care – PPO | Source: Ambulatory Visit | Attending: Thoracic Surgery (Cardiothoracic Vascular Surgery) | Admitting: Thoracic Surgery (Cardiothoracic Vascular Surgery)

## 2019-05-18 ENCOUNTER — Ambulatory Visit (INDEPENDENT_AMBULATORY_CARE_PROVIDER_SITE_OTHER): Payer: Self-pay | Admitting: Physician Assistant

## 2019-05-18 ENCOUNTER — Encounter: Payer: Self-pay | Admitting: *Deleted

## 2019-05-18 VITALS — BP 155/84 | HR 110 | Temp 97.9°F | Resp 24 | Ht 70.0 in | Wt 230.0 lb

## 2019-05-18 DIAGNOSIS — I10 Essential (primary) hypertension: Secondary | ICD-10-CM

## 2019-05-18 DIAGNOSIS — I350 Nonrheumatic aortic (valve) stenosis: Secondary | ICD-10-CM

## 2019-05-18 DIAGNOSIS — Z953 Presence of xenogenic heart valve: Secondary | ICD-10-CM

## 2019-05-18 DIAGNOSIS — R000 Tachycardia, unspecified: Secondary | ICD-10-CM

## 2019-05-18 NOTE — Patient Instructions (Signed)
Increase metoprolol to 75mg  by mouth twice daily  May resume driving  Follow up with Dr. Roxy Manns in 1 month   May lift /push /pull no more than 15 lbs.

## 2019-05-18 NOTE — Progress Notes (Signed)
HPI:  Patient returns for routine postoperative follow-up having undergone aortic valve replacement by way of a mini anterior thoracotomy on 04/29/19.  The patient's early postoperative recovery while in the hospital was unremarkable.  He was seen in the office last week with the complaint of weakness, dizziness and polydipsia. After evaluation it was determined he was likely being diuresed too aggressively. His Lasix was discontinued and he has gradually improved overall and the weakness / dizziness have resolved. He denies pain other than expected soreness and is not short of breath. He has no new concerns today.   Current Outpatient Medications  Medication Sig Dispense Refill  . Accu-Chek FastClix Lancets MISC Use as directed to check blood sugar daily 100 each 1  . ACCU-CHEK GUIDE test strip USE AS INSTRUCTED 100 strip 1  . albuterol (PROVENTIL) (2.5 MG/3ML) 0.083% nebulizer solution USE 1 VIAL VIA NEBULIZER EVERY 6 HOURS AS NEEDED (Patient taking differently: Take 2.5 mg by nebulization every 6 (six) hours as needed for wheezing or shortness of breath. USE 1 VIAL VIA NEBULIZER EVERY 6 HOURS AS NEEDE) 150 mL 3  . albuterol (VENTOLIN HFA) 108 (90 Base) MCG/ACT inhaler INHALE 2 PUFFS INTO THE LUNGS EVERY 6 HOURS AS NEEDED FOR WHEEZING OR SHORTNESS OF BREATH 18 g 4  . aspirin EC 325 MG EC tablet Take 1 tablet (325 mg total) by mouth daily. 30 tablet 0  . Blood Glucose Monitoring Suppl (ACCU-CHEK GUIDE) w/Device KIT 1 each by Does not apply route as directed. 1 kit 0  . Cholecalciferol (VITAMIN D) 50 MCG (2000 UT) CAPS Take 2,000 Units by mouth in the morning and at bedtime.     . cyanocobalamin (V-R VITAMIN B-12) 500 MCG tablet Take 1 tablet (500 mcg total) by mouth daily.    . finasteride (PROSCAR) 5 MG tablet TAKE 1 TABLET BY MOUTH EVERY DAY (Patient taking differently: Take 5 mg by mouth daily. TAKE 1 TABLET BY MOUTH EVERY DAY) 90 tablet 0  . Fluticasone-Salmeterol (ADVAIR DISKUS) 250-50  MCG/DOSE AEPB INHALE 1 PUFF BY MOUTH TWICE DAILY. RINSE MOUTH AFTER USE 180 each 1  . guaiFENesin (MUCINEX) 600 MG 12 hr tablet Take 2 tablets (1,200 mg total) by mouth 2 (two) times daily.    . metFORMIN (GLUCOPHAGE) 500 MG tablet TAKE 1 TABLET (500 MG TOTAL) BY MOUTH 2 (TWO) TIMES DAILY WITH A MEAL. 180 tablet 0  . metoprolol tartrate (LOPRESSOR) 50 MG tablet Take 1 tablet (50 mg total) by mouth 2 (two) times daily. 60 tablet 1  . montelukast (SINGULAIR) 10 MG tablet TAKE 1 TABLET BY MOUTH EVERY NIGHT AT BEDTIME (Patient taking differently: Take 10 mg by mouth at bedtime. ) 90 tablet 2  . oxyCODONE (OXY IR/ROXICODONE) 5 MG immediate release tablet Take 1 tablet (5 mg total) by mouth every 6 (six) hours as needed for severe pain. 30 tablet 0  . pantoprazole (PROTONIX) 40 MG tablet Take 40 mg by mouth at bedtime.    . simvastatin (ZOCOR) 80 MG tablet TAKE 1 TABLET BY MOUTH EVERYDAY AT BEDTIME (Patient taking differently: Take 80 mg by mouth at bedtime. ) 90 tablet 3  . valsartan-hydrochlorothiazide (DIOVAN-HCT) 160-25 MG tablet Take 1 tablet by mouth daily. 90 tablet 1  . furosemide (LASIX) 40 MG tablet Take 2 tablets (80 mg total) by mouth 2 (two) times daily for 7 days. then take 65m BID until we see you in follow-up. (Patient not taking: Reported on 05/18/2019) 90 tablet 1  . potassium chloride 20  MEQ TBCR Take 40 mEq by mouth 2 (two) times daily for 7 days. Then take 20MEQ (1 tab) by mouth BID until we see you in follow-up. (Patient not taking: Reported on 05/18/2019) 90 tablet 1   No current facility-administered medications for this visit.   CARDIOTHORACIC SURGERY OPERATIVE NOTE  Date of Procedure:                04/29/2019  Preoperative Diagnosis:      Severe Aortic Stenosis   Postoperative Diagnosis:    Same   Procedure:        Minimally Invasive Aortic Valve Replacement Right Anterior Mini-thoracotomy Edwards Inspiris Resilia Stented Bovine Pericardial Tissue Valve (size 9m,  catalog #[id number], serial #[id number] Plating of right 2nd and 3rd costal cartilages (KLS titanium plates)              Surgeon:        CValentina Gu ORoxy Manns MD  Assistant:       DNani Skillern PA-C  Anesthesia:    CLaurie Panda MD  Operative Findings: ? Severe calcific aortic stenosis ? Normal left ventricular systolic function ? Moderate left ventricular hypertrophy with diastolic dysfunction    Physical Exam   Heart- RRR, soft systolic murmur c/w the bioprosthetic aortic valve.  Chest- breath sounds are CTA, full and equal.   Incisions- the left anterior chest incision is well approximated and and is dry. There is expected bruising over the anterior chest both left and right of the sternum. The chest tube sites are dry and healing.   Exts- no edema.  Diagnostic Tests:  EXAM: CHEST - 2 VIEW  COMPARISON:  May 10, 2017  FINDINGS: There is mild right base atelectatic change. Lungs elsewhere are clear. Heart is mildly enlarged with pulmonary vascularity normal. Status post aortic valve replacement. There is postoperative change in the sternum region. There is mild degenerative change in the thoracic spine. No pneumothorax.  IMPRESSION: Status post aortic valve replacement. Heart mildly enlarged. Right base atelectasis. Lungs elsewhere clear.   Electronically Signed   By: WLowella GripIII M.D.   On: 05/18/2019 12:25   Impression: -Making satisfactory progress 3 weeks s/p tissue AVR. He is feeling stronger and is not short of breath. He has no peripheral edema after being off Lasix for  1 week so I see no reason to resume it.   -Mild HTN and tachycardia- noted on last admission. Will increase the metoprolol to 795mpo BID. He will continue valsartan / HCTZ.   Plan:  F/u with cardiology Thursday 4/8 as scheduled.  F/u with Dr. OwRoxy Mannsn I month.    MyAntony OdeaPA-C [facility] [phone]

## 2019-05-18 NOTE — Telephone Encounter (Signed)
Received completed forms from physician, sent to [facility] via interoffice

## 2019-05-19 ENCOUNTER — Other Ambulatory Visit: Payer: Self-pay | Admitting: Family Medicine

## 2019-05-21 ENCOUNTER — Ambulatory Visit (INDEPENDENT_AMBULATORY_CARE_PROVIDER_SITE_OTHER): Payer: BC Managed Care – PPO | Admitting: Cardiology

## 2019-05-21 ENCOUNTER — Other Ambulatory Visit: Payer: Self-pay

## 2019-05-21 ENCOUNTER — Encounter: Payer: Self-pay | Admitting: Cardiology

## 2019-05-21 VITALS — BP 130/78 | HR 95 | Ht 70.0 in | Wt 227.4 lb

## 2019-05-21 DIAGNOSIS — I10 Essential (primary) hypertension: Secondary | ICD-10-CM | POA: Diagnosis not present

## 2019-05-21 DIAGNOSIS — E7800 Pure hypercholesterolemia, unspecified: Secondary | ICD-10-CM | POA: Diagnosis not present

## 2019-05-21 DIAGNOSIS — Z953 Presence of xenogenic heart valve: Secondary | ICD-10-CM | POA: Diagnosis not present

## 2019-05-21 NOTE — Patient Instructions (Signed)
Medication Instructions:  No changes  *If you need a refill on your cardiac medications before your next appointment, please call your pharmacy*   Lab Work: None  If you have labs (blood work) drawn today and your tests are completely normal, you will receive your results only by: Marland Kitchen MyChart Message (if you have MyChart) OR . A paper copy in the mail If you have any lab test that is abnormal or we need to change your treatment, we will call you to review the results.   Testing/Procedures: Echocardiogram in 3 months Your physician has requested that you have an echocardiogram. Echocardiography is a painless test that uses sound waves to create images of your heart. It provides your doctor with information about the size and shape of your heart and how well your heart's chambers and valves are working. This procedure takes approximately one hour. There are no restrictions for this procedure.     Follow-Up: At [facility], you and your health needs are our priority.  As part of our continuing mission to provide you with exceptional heart care, we have created designated Provider Care Teams.  These Care Teams include your primary Cardiologist (physician) and Advanced Practice Providers (APPs -  Physician Assistants and Nurse Practitioners) who all work together to provide you with the care you need, when you need it.  We recommend signing up for the patient portal called "MyChart".  Sign up information is provided on this After Visit Summary.  MyChart is used to connect with patients for Virtual Visits (Telemedicine).  Patients are able to view lab/test results, encounter notes, upcoming appointments, etc.  Non-urgent messages can be sent to your provider as well.   To learn more about what you can do with MyChart, go to [email or web address].    Your next appointment:   Follow up after your Echocardiogram.   The format for your next appointment:   In Person  Provider:    You may  see Kate Sable, MD or one of the following Advanced Practice Providers on your designated Care Team:    Murray Hodgkins, NP  Christell Faith, PA-C  Marrianne Mood, PA-C

## 2019-05-21 NOTE — Progress Notes (Signed)
Cardiology Office Note:    Date:  05/21/2019   ID:  Erik Allen, DOB 1955/10/06, MRN [id number]  PCP:  Ria Bush, MD  Cardiologist:  Kate Sable, MD  Electrophysiologist:  None   Referring MD: Ria Bush, MD   Chief Complaint  Patient presents with  . other    4 week follow up s/p aortic valve repalcement. "doing well." Meds reviewed by the pt. verbally.     History of Present Illness:    Erik Allen is a 64 y.o. male with a hx of aortic stenosis status post aortic valve replacement on 04/2019, hypertension, hyperlipidemia who presents for follow-up.  Patient was last seen due to aortic stenosis.  Echocardiogram did reveal severe aortic valve calcification with severe aortic stenosis.  He was referred to cardiac surgery for evaluation and management.  He underwent a minimally invasive aortic valve replacement with a bioprosthetic valve on 04/29/2019.  Postoperative course has been uneventful.  Still has occasional chest tenderness but this has been improving.   Past Medical History:  Diagnosis Date  . Aortic stenosis 06/21/2014   Echo 01/2018: Mild LVH, normal sys fxn EF 55-60%, G1DD, moderate AS with mildly dilated LA. rec yearly echocardiogram  . Asthma 09/13/99  . BENIGN PROSTATIC HYPERTROPHY 05/14/2003  . CAP (community acquired pneumonia) 01/31/2015  . Diabetes mellitus without complication ([place])   . Disorder of vocal cord 2012   h/o thrush on vocal cords per patient  . GERD (gastroesophageal reflux disease) 10/13/01   ENT eval for GERD and thrush 2011  . History of kidney stones   . HTN (hypertension)   . Hyperlipemia 10/13/97  . S/P minimally invasive aortic valve replacement with bioprosthetic valve 04/29/2019   Edwards Inspiris Resilia stented bovine pericardial tissue valve (size 23 mm)  . Seasonal allergic rhinitis    worse in spring  . Systolic murmur   . Transaminitis 2014   presumed fatty liver without [place], normal iron and viral hep panel      Past Surgical History:  Procedure Laterality Date  . A1 pulley release  10/2010   stensosing tenosynovitis, R milddle, ring, little fingers  . AORTIC VALVE REPLACEMENT N/A 04/29/2019   Procedure: MINIMALLY INVASIVE AORTIC VALVE REPLACEMENT (AVR) USING INSPIRIS VALVE SIZE 23MM;  Surgeon: Rexene Alberts, MD;  Location: [facility];  Service: Open Heart Surgery;  Laterality: N/A;  . COLONOSCOPY  2010   WNL, rpt due 10 yrs  . CYSTECTOMY     from back  . CYSTOSCOPY  08/02   Stone retrieval  . EAR CYST EXCISION Left 08/02/2016   Procedure: EXCISION MUCOID CYST LEFT MIDDLE FINGER WITH DISTAL INTERPHALANGEAL JOINT [place];  Surgeon: Daryll Brod, MD;  Location: [place];  Service: Orthopedics;  Laterality: Left;  . FOOT CAPSULE RELEASE W/ PERCUTANEOUS HEEL CORD LENGTHENING, TIBIAL TENDON TRANSFER  04/09   bilateral, Dr. Milinda Pointer  . INGUINAL HERNIA REPAIR  06/28/09   left, with mesh, Dr. Bary Castilla  . RIGHT/LEFT HEART CATH AND CORONARY ANGIOGRAPHY Bilateral 04/06/2019   Procedure: RIGHT/LEFT HEART CATH AND CORONARY ANGIOGRAPHY;  Surgeon: Wellington Hampshire, MD;  Location: [facility];  Service: Cardiovascular;  Laterality: Bilateral;  . ROTATION FLAP Left 08/02/2016   Procedure: ROTATION DORSAL FLAP;  Surgeon: Daryll Brod, MD;  Location: [place];  Service: Orthopedics;  Laterality: Left;  . TEE WITHOUT CARDIOVERSION N/A 04/29/2019   Procedure: TRANSESOPHAGEAL ECHOCARDIOGRAM (TEE);  Surgeon: Rexene Alberts, MD;  Location: [facility] [place];  Service:  Open Heart Surgery;  Laterality: N/A;  . US ECHOCARDIOGRAPHY  09/01   TR, MR    Current Medications: Current Meds  Medication Sig  . Accu-Chek FastClix Lancets MISC Use as directed to check blood sugar daily  . ACCU-CHEK GUIDE test strip USE AS INSTRUCTED  . albuterol (PROVENTIL) (2.5 MG/3ML) 0.083% nebulizer solution USE 1 VIAL VIA NEBULIZER EVERY 6 HOURS AS NEEDED (Patient taking differently: Take 2.5 mg by nebulization  every 6 (six) hours as needed for wheezing or shortness of breath. USE 1 VIAL VIA NEBULIZER EVERY 6 HOURS AS NEEDE)  . albuterol (VENTOLIN HFA) 108 (90 Base) MCG/ACT inhaler INHALE 2 PUFFS INTO THE LUNGS EVERY 6 HOURS AS NEEDED FOR WHEEZING OR SHORTNESS OF BREATH  . aspirin EC 325 MG EC tablet Take 1 tablet (325 mg total) by mouth daily.  . Blood Glucose Monitoring Suppl (ACCU-CHEK GUIDE) w/Device KIT 1 each by Does not apply route as directed.  . Cholecalciferol (VITAMIN D) 50 MCG (2000 UT) CAPS Take 2,000 Units by mouth in the morning and at bedtime.   . cyanocobalamin (V-R VITAMIN B-12) 500 MCG tablet Take 1 tablet (500 mcg total) by mouth daily.  . finasteride (PROSCAR) 5 MG tablet TAKE 1 TABLET BY MOUTH EVERY DAY  . Fluticasone-Salmeterol (ADVAIR DISKUS) 250-50 MCG/DOSE AEPB INHALE 1 PUFF BY MOUTH TWICE DAILY. RINSE MOUTH AFTER USE  . metFORMIN (GLUCOPHAGE) 500 MG tablet TAKE 1 TABLET (500 MG TOTAL) BY MOUTH 2 (TWO) TIMES DAILY WITH A MEAL.  . metoprolol tartrate (LOPRESSOR) 50 MG tablet Take 1 tablet (50 mg total) by mouth 2 (two) times daily. (Patient taking differently: Take 75 mg by mouth 2 (two) times daily. )  . montelukast (SINGULAIR) 10 MG tablet TAKE 1 TABLET BY MOUTH EVERY NIGHT AT BEDTIME (Patient taking differently: Take 10 mg by mouth at bedtime. )  . oxyCODONE (OXY IR/ROXICODONE) 5 MG immediate release tablet Take 1 tablet (5 mg total) by mouth every 6 (six) hours as needed for severe pain.  . pantoprazole (PROTONIX) 40 MG tablet Take 40 mg by mouth at bedtime.  . simvastatin (ZOCOR) 80 MG tablet TAKE 1 TABLET BY MOUTH EVERYDAY AT BEDTIME (Patient taking differently: Take 80 mg by mouth at bedtime. )  . valsartan-hydrochlorothiazide (DIOVAN-HCT) 160-25 MG tablet Take 1 tablet by mouth daily.     Allergies:   Ace inhibitors, Codeine, and Tramadol   Social History   Socioeconomic History  . Marital status: Single    Spouse name: Not on file  . Number of children: Not on file    . Years of education: Not on file  . Highest education level: Not on file  Occupational History  . Occupation: TEAM LEADER    Employer: [employer]    Comment: Sheet metal fabrication  Tobacco Use  . Smoking status: Never Smoker  . Smokeless tobacco: Never Used  Substance and Sexual Activity  . Alcohol use: Yes    Alcohol/week: 1.0 - 2.0 standard drinks    Types: 1 - 2 Standard drinks or equivalent per week    Comment: occassionally  . Drug use: No  . Sexual activity: Yes  Other Topics Concern  . 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 Difficulty of Paying Living Expenses:   Food Insecurity:   . Worried About Charity fundraiser in the Last Year:   . Arboriculturist in the Last Year:   Transportation Needs:   . Film/video editor (Medical):   Marland Kitchen Lack of Transportation (Non-Medical):   Physical Activity:   . Days of Exercise per Week:   . Minutes of Exercise per Session:   Stress:   . Feeling of Stress :   Social Connections:   . Frequency of Communication with Friends and Family:   . Frequency of Social Gatherings with Friends and Family:   . Attends Religious Services:   . Active Member of Clubs or Organizations:   . Attends Archivist Meetings:   Marland Kitchen Marital Status:      Family History: The patient's family history includes Cancer (age of onset: 33) in his father; Hypertension in his brother, father, and mother.  ROS:   Please see the history of present illness.     All other systems reviewed and are negative.  EKGs/Labs/Other Studies Reviewed:    The following studies were reviewed today:   EKG:  EKG is  ordered today.  The ekg  ordered today demonstrates normal sinus rhythm, occasional PVCs  Recent Labs: 04/27/2019: ALT 72 05/02/2019: Magnesium 2.4 05/03/2019: BUN 26; Creatinine, Ser 0.94; Hemoglobin 8.1; Platelets 140; Potassium 3.8; Sodium 139  Recent Lipid Panel    Component Value Date/Time   CHOL 158 08/14/2018 1152   TRIG 151.0 (H) 08/14/2018 1152   HDL 55.10 08/14/2018 1152   CHOLHDL 3 08/14/2018 1152   VLDL 30.2 08/14/2018 1152   LDLCALC 72 08/14/2018 1152   LDLDIRECT 94.0 07/09/2017 0739    Physical Exam:    VS:  BP 130/78 (BP Location: Left Arm, Patient Position: Sitting, Cuff Size: Normal)   Pulse 95   Ht '5\' 10"'$  (1.778 m)   Wt 227 lb 6 oz (103.1 kg)   SpO2 98%   BMI 32.62 kg/m     Wt Readings from Last 3 Encounters:  05/21/19 227 lb 6 oz (103.1 kg)  05/18/19 230 lb (104.3 kg)  05/11/19 226 lb (102.5 kg)     GEN:  Well nourished, well developed in no acute distress HEENT: Normal NECK: No JVD; No carotid bruits LYMPHATICS: No lymphadenopathy CARDIAC: RRR, 1/6 systolic murmur, rusb, no gallops RESPIRATORY:  Clear to auscultation without rales, wheezing or rhonchi  ABDOMEN: Soft, non-tender, non-distended MUSCULOSKELETAL:  No edema; No deformity  SKIN: Warm and dry NEUROLOGIC:  Alert and oriented x 3 PSYCHIATRIC:  Normal affect   ASSESSMENT:    1. S/P minimally invasive aortic valve replacement with bioprosthetic valve   2. Essential hypertension   3. Pure hypercholesterolemia    PLAN:    In order of problems listed above:  1. Patient with history of severe aortic stenosis status post aortic valve replacement with a bioprosthetic valve on 04/2019, minimally invasive approach.  Postop course has been unremarkable.  Patient with some chest soreness.  We will plan for repeat echocardiogram in 3 months to evaluate heart valve. 2. Blood pressure reasonably controlled.  Continue current BP meds 3. History of hyperlipidemia, continue statin.  Follow-up after echo in 3  months   Medication Adjustments/Labs and Tests Ordered: Current medicines are reviewed at length with the patient today.  Concerns regarding medicines are outlined above.  Orders Placed This Encounter  Procedures  . EKG  12-Lead  . ECHOCARDIOGRAM COMPLETE   No orders of the defined types were placed in this encounter.   Patient Instructions  Medication Instructions:  No changes  *If you need a refill on your cardiac medications before your next appointment, please call your pharmacy*   Lab Work: None  If you have labs (blood work) drawn today and your tests are completely normal, you will receive your results only by: Marland Kitchen MyChart Message (if you have MyChart) OR . A paper copy in the mail If you have any lab test that is abnormal or we need to change your treatment, we will call you to review the results.   Testing/Procedures: Echocardiogram in 3 months Your physician has requested that you have an echocardiogram. Echocardiography is a painless test that uses sound waves to create images of your heart. It provides your doctor with information about the size and shape of your heart and how well your heart's chambers and valves are working. This procedure takes approximately one hour. There are no restrictions for this procedure.     Follow-Up: At [facility], you and your health needs are our priority.  As part of our continuing mission to provide you with exceptional heart care, we have created designated Provider Care Teams.  These Care Teams include your primary Cardiologist (physician) and Advanced Practice Providers (APPs -  Physician Assistants and Nurse Practitioners) who all work together to provide you with the care you need, when you need it.  We recommend signing up for the patient portal called "MyChart".  Sign up information is provided on this After Visit Summary.  MyChart is used to connect with patients for Virtual Visits (Telemedicine).  Patients are able to view  lab/test results, encounter notes, upcoming appointments, etc.  Non-urgent messages can be sent to your provider as well.   To learn more about what you can do with MyChart, go to [email or web address].    Your next appointment:   Follow up after your Echocardiogram.   The format for your next appointment:   In Person  Provider:    You may see Kate Sable, MD or one of the following Advanced Practice Providers on your designated Care Team:    Murray Hodgkins, NP  Christell Faith, PA-C  Marrianne Mood, PA-C      Signed, Kate Sable, MD  05/21/2019 9:35 AM    [place]

## 2019-05-22 ENCOUNTER — Encounter: Payer: BC Managed Care – PPO | Attending: Cardiovascular Disease | Admitting: *Deleted

## 2019-05-22 DIAGNOSIS — Z7982 Long term (current) use of aspirin: Secondary | ICD-10-CM | POA: Insufficient documentation

## 2019-05-22 DIAGNOSIS — N400 Enlarged prostate without lower urinary tract symptoms: Secondary | ICD-10-CM | POA: Insufficient documentation

## 2019-05-22 DIAGNOSIS — E785 Hyperlipidemia, unspecified: Secondary | ICD-10-CM | POA: Insufficient documentation

## 2019-05-22 DIAGNOSIS — Z79899 Other long term (current) drug therapy: Secondary | ICD-10-CM | POA: Insufficient documentation

## 2019-05-22 DIAGNOSIS — Z952 Presence of prosthetic heart valve: Secondary | ICD-10-CM

## 2019-05-22 DIAGNOSIS — Z87442 Personal history of urinary calculi: Secondary | ICD-10-CM | POA: Insufficient documentation

## 2019-05-22 DIAGNOSIS — J45909 Unspecified asthma, uncomplicated: Secondary | ICD-10-CM | POA: Insufficient documentation

## 2019-05-22 DIAGNOSIS — Z7951 Long term (current) use of inhaled steroids: Secondary | ICD-10-CM | POA: Insufficient documentation

## 2019-05-22 DIAGNOSIS — Z7984 Long term (current) use of oral hypoglycemic drugs: Secondary | ICD-10-CM | POA: Insufficient documentation

## 2019-05-22 DIAGNOSIS — I10 Essential (primary) hypertension: Secondary | ICD-10-CM | POA: Insufficient documentation

## 2019-05-22 DIAGNOSIS — Z953 Presence of xenogenic heart valve: Secondary | ICD-10-CM | POA: Insufficient documentation

## 2019-05-22 DIAGNOSIS — E119 Type 2 diabetes mellitus without complications: Secondary | ICD-10-CM | POA: Insufficient documentation

## 2019-05-22 DIAGNOSIS — K219 Gastro-esophageal reflux disease without esophagitis: Secondary | ICD-10-CM | POA: Insufficient documentation

## 2019-05-22 NOTE — Progress Notes (Signed)
Completed Virtual Orientation call.  Has appt.on 4/13 with EP for eval and gym orientation.  Diagnosis documentation can be found in [facility] 04/29/2019

## 2019-05-26 ENCOUNTER — Encounter: Payer: BC Managed Care – PPO | Admitting: *Deleted

## 2019-05-26 ENCOUNTER — Other Ambulatory Visit: Payer: Self-pay

## 2019-05-26 VITALS — Ht 69.1 in | Wt 226.6 lb

## 2019-05-26 DIAGNOSIS — Z7984 Long term (current) use of oral hypoglycemic drugs: Secondary | ICD-10-CM | POA: Diagnosis not present

## 2019-05-26 DIAGNOSIS — Z952 Presence of prosthetic heart valve: Secondary | ICD-10-CM

## 2019-05-26 DIAGNOSIS — I10 Essential (primary) hypertension: Secondary | ICD-10-CM | POA: Diagnosis not present

## 2019-05-26 DIAGNOSIS — J45909 Unspecified asthma, uncomplicated: Secondary | ICD-10-CM | POA: Diagnosis not present

## 2019-05-26 DIAGNOSIS — Z7951 Long term (current) use of inhaled steroids: Secondary | ICD-10-CM | POA: Diagnosis not present

## 2019-05-26 DIAGNOSIS — K219 Gastro-esophageal reflux disease without esophagitis: Secondary | ICD-10-CM | POA: Diagnosis not present

## 2019-05-26 DIAGNOSIS — Z79899 Other long term (current) drug therapy: Secondary | ICD-10-CM | POA: Diagnosis not present

## 2019-05-26 DIAGNOSIS — E119 Type 2 diabetes mellitus without complications: Secondary | ICD-10-CM | POA: Diagnosis not present

## 2019-05-26 DIAGNOSIS — Z7982 Long term (current) use of aspirin: Secondary | ICD-10-CM | POA: Diagnosis not present

## 2019-05-26 DIAGNOSIS — Z87442 Personal history of urinary calculi: Secondary | ICD-10-CM | POA: Diagnosis not present

## 2019-05-26 DIAGNOSIS — N400 Enlarged prostate without lower urinary tract symptoms: Secondary | ICD-10-CM | POA: Diagnosis not present

## 2019-05-26 DIAGNOSIS — E785 Hyperlipidemia, unspecified: Secondary | ICD-10-CM | POA: Diagnosis not present

## 2019-05-26 DIAGNOSIS — Z953 Presence of xenogenic heart valve: Secondary | ICD-10-CM | POA: Diagnosis present

## 2019-05-26 NOTE — Progress Notes (Signed)
Cardiac Individual Treatment Plan  Patient Details  Name: Erik Allen MRN: [id number] Date of Birth: [birth year] Referring Provider:     Cardiac Rehab from 05/26/2019 in [facility] Cardiac and Pulmonary Rehab  Referring Provider  [name] MD      Initial Encounter Date:    Cardiac Rehab from 05/26/2019 in [facility] Cardiac and Pulmonary Rehab  Date  05/26/19      Visit Diagnosis: S/P aortic valve replacement  Patient's Home Medications on Admission:  Current Outpatient Medications:  .  Accu-Chek FastClix Lancets MISC, Use as directed to check blood sugar daily, Disp: 100 each, Rfl: 1 .  ACCU-CHEK GUIDE test strip, USE AS INSTRUCTED, Disp: 100 strip, Rfl: 1 .  albuterol (PROVENTIL) (2.5 MG/3ML) 0.083% nebulizer solution, USE 1 VIAL VIA NEBULIZER EVERY 6 HOURS AS NEEDED (Patient taking differently: Take 2.5 mg by nebulization every 6 (six) hours as needed for wheezing or shortness of breath. USE 1 VIAL VIA NEBULIZER EVERY 6 HOURS AS NEEDE), Disp: 150 mL, Rfl: 3 .  albuterol (VENTOLIN HFA) 108 (90 Base) MCG/ACT inhaler, INHALE 2 PUFFS INTO THE LUNGS EVERY 6 HOURS AS NEEDED FOR WHEEZING OR SHORTNESS OF BREATH, Disp: 18 g, Rfl: 4 .  aspirin EC 325 MG EC tablet, Take 1 tablet (325 mg total) by mouth daily., Disp: 30 tablet, Rfl: 0 .  Blood Glucose Monitoring Suppl (ACCU-CHEK GUIDE) w/Device KIT, 1 each by Does not apply route as directed., Disp: 1 kit, Rfl: 0 .  Cholecalciferol (VITAMIN D) 50 MCG (2000 UT) CAPS, Take 2,000 Units by mouth in the morning and at bedtime. , Disp: , Rfl:  .  cyanocobalamin (V-R VITAMIN B-12) 500 MCG tablet, Take 1 tablet (500 mcg total) by mouth daily., Disp: , Rfl:  .  finasteride (PROSCAR) 5 MG tablet, TAKE 1 TABLET BY MOUTH EVERY DAY, Disp: 90 tablet, Rfl: 1 .  Fluticasone-Salmeterol (ADVAIR DISKUS) 250-50 MCG/DOSE AEPB, INHALE 1 PUFF BY MOUTH TWICE DAILY. RINSE MOUTH AFTER USE, Disp: 180 each, Rfl: 1 .  metFORMIN (GLUCOPHAGE) 500 MG tablet, TAKE 1 TABLET (500 MG  TOTAL) BY MOUTH 2 (TWO) TIMES DAILY WITH A MEAL., Disp: 180 tablet, Rfl: 0 .  metoprolol tartrate (LOPRESSOR) 50 MG tablet, Take 1 tablet (50 mg total) by mouth 2 (two) times daily. (Patient taking differently: Take 75 mg by mouth 2 (two) times daily. ), Disp: 60 tablet, Rfl: 1 .  montelukast (SINGULAIR) 10 MG tablet, TAKE 1 TABLET BY MOUTH EVERY NIGHT AT BEDTIME (Patient taking differently: Take 10 mg by mouth at bedtime. ), Disp: 90 tablet, Rfl: 2 .  oxyCODONE (OXY IR/ROXICODONE) 5 MG immediate release tablet, Take 1 tablet (5 mg total) by mouth every 6 (six) hours as needed for severe pain. (Patient not taking: Reported on 05/22/2019), Disp: 30 tablet, Rfl: 0 .  pantoprazole (PROTONIX) 40 MG tablet, Take 40 mg by mouth at bedtime., Disp: , Rfl:  .  simvastatin (ZOCOR) 80 MG tablet, TAKE 1 TABLET BY MOUTH EVERYDAY AT BEDTIME (Patient taking differently: Take 80 mg by mouth at bedtime. ), Disp: 90 tablet, Rfl: 3 .  valsartan-hydrochlorothiazide (DIOVAN-HCT) 160-25 MG tablet, Take 1 tablet by mouth daily., Disp: 90 tablet, Rfl: 1  Past Medical History: Past Medical History:  Diagnosis Date  . Aortic stenosis 06/21/2014   Echo 01/2018: Mild LVH, normal sys fxn EF 55-60%, G1DD, moderate AS with mildly dilated LA. rec yearly echocardiogram  . Asthma 09/13/99  . BENIGN PROSTATIC HYPERTROPHY 05/14/2003  . CAP (community acquired pneumonia) 01/31/2015  .  Diabetes mellitus without complication ([place])   . Disorder of vocal cord 2012   h/o thrush on vocal cords per patient  . GERD (gastroesophageal reflux disease) 10/13/01   ENT eval for GERD and thrush 2011  . History of kidney stones   . HTN (hypertension)   . Hyperlipemia 10/13/97  . S/P minimally invasive aortic valve replacement with bioprosthetic valve 04/29/2019   Edwards Inspiris Resilia stented bovine pericardial tissue valve (size 23 mm)  . Seasonal allergic rhinitis    worse in spring  . Systolic murmur   . Transaminitis 2014   presumed fatty  liver without [place], normal iron and viral hep panel    Tobacco Use: Social History   Tobacco Use  Smoking Status Never Smoker  Smokeless Tobacco Never Used    Labs: Recent Review Flowsheet Data    Labs for ITP Cardiac and Pulmonary Rehab Latest Ref Rng & Units 04/29/2019 04/30/2019 04/30/2019 04/30/2019 05/01/2019   Cholestrol 0 - 200 mg/dL - - - - -   LDLCALC 0 - 99 mg/dL - - - - -   LDLDIRECT mg/dL - - - - -   HDL >39.00 mg/dL - - - - -   Trlycerides 0.0 - 149.0 mg/dL - - - - -   Hemoglobin A1c 4.8 - 5.6 % - - - - -   PHART 7.350 - 7.450 7.368 7.306(L) 7.328(L) - -   PCO2ART 32.0 - 48.0 mmHg 38.1 43.1 40.0 - -   HCO3 20.0 - 28.0 mmol/L 21.9 21.5 20.9 - -   TCO2 22 - 32 mmol/L '23 23 22 '$ - -   ACIDBASEDEF 0.0 - 2.0 mmol/L 3.0(H) 4.0(H) 5.0(H) - -   O2SAT % 95.0 93.0 91.0 59.5 65.0       Exercise Target Goals: Exercise Program Goal: Individual exercise prescription set using results from initial 6 min walk test and THRR while considering  patient's activity barriers and safety.   Exercise Prescription Goal: Initial exercise prescription builds to 30-45 minutes a day of aerobic activity, 2-3 days per week.  Home exercise guidelines will be given to patient during program as part of exercise prescription that the participant will acknowledge.   Education: Aerobic Exercise & Resistance Training: - Gives group verbal and written instruction on the various components of exercise. Focuses on aerobic and resistive training programs and the benefits of this training and how to safely progress through these programs..   Education: Exercise & Equipment Safety: - Individual verbal instruction and demonstration of equipment use and safety with use of the equipment.   Cardiac Rehab from 05/26/2019 in [facility] Cardiac and Pulmonary Rehab  Date  05/26/19  Educator  [facility]  Instruction Review Code  1- Verbalizes Understanding      Education: Exercise Physiology & General Exercise Guidelines: -  Group verbal and written instruction with models to review the exercise physiology of the cardiovascular system and associated critical values. Provides general exercise guidelines with specific guidelines to those with heart or lung disease.    Education: Flexibility, Balance, Mind/Body Relaxation: Provides group verbal/written instruction on the benefits of flexibility and balance training, including mind/body exercise modes such as yoga, pilates and tai chi.  Demonstration and skill practice provided.   Activity Barriers & Risk Stratification: Activity Barriers & Cardiac Risk Stratification - 05/26/19 1526      Activity Barriers & Cardiac Risk Stratification   Activity Barriers  Deconditioning;Balance Concerns;Muscular Weakness;Joint Problems    Cardiac Risk Stratification  Low  6 Minute Walk: 6 Minute Walk    Row Name 05/26/19 1525         6 Minute Walk   Phase  Initial     Distance  1005 feet     Walk Time  6 minutes     # of Rest Breaks  0     MPH  1.9     METS  2.65     RPE  7     VO2 Peak  9.26     Symptoms  Yes (comment)     Comments  hip pain 3/10     Resting HR  96 bpm     Resting BP  130/74     Resting Oxygen Saturation   97 %     Exercise Oxygen Saturation  during 6 min walk  96 %     Max Ex. HR  106 bpm     Max Ex. BP  136/64     2 Minute Post BP  126/74        Oxygen Initial Assessment:   Oxygen Re-Evaluation:   Oxygen Discharge (Final Oxygen Re-Evaluation):   Initial Exercise Prescription: Initial Exercise Prescription - 05/26/19 1500      Date of Initial Exercise RX and Referring Provider   Date  05/26/19    Referring Provider  [name] MD      Treadmill   MPH  1.9    Grade  1    Minutes  15    METs  2.72      Elliptical   Level  1    Speed  2.1    Minutes  15      REL-XR   Level  2    Speed  50    Minutes  15    METs  2.7      Prescription Details   Frequency (times per week)  2    Duration  Progress to 30  minutes of continuous aerobic without signs/symptoms of physical distress      Intensity   THRR 40-80% of Max Heartrate  121-146    Ratings of Perceived Exertion  11-13    Perceived Dyspnea  0-4      Progression   Progression  Continue to progress workloads to maintain intensity without signs/symptoms of physical distress.      Resistance Training   Training Prescription  Yes    Weight  3 lb    Reps  10-15       Perform Capillary Blood Glucose checks as needed.  Exercise Prescription Changes: Exercise Prescription Changes    Row Name 05/26/19 1500             Response to Exercise   Blood Pressure (Admit)  130/74       Blood Pressure (Exercise)  136/64       Blood Pressure (Exit)  126/74       Heart Rate (Admit)  96 bpm       Heart Rate (Exercise)  106 bpm       Heart Rate (Exit)  98 bpm       Oxygen Saturation (Admit)  97 %       Oxygen Saturation (Exercise)  96 %       Rating of Perceived Exertion (Exercise)  7       Symptoms  hip pain 3/10       Comments  walk test results  Exercise Comments:   Exercise Goals and Review: Exercise Goals    Row Name 05/26/19 1528             Exercise Goals   Increase Physical Activity  Yes       Intervention  Provide advice, education, support and counseling about physical activity/exercise needs.;Develop an individualized exercise prescription for aerobic and resistive training based on initial evaluation findings, risk stratification, comorbidities and participant's personal goals.       Expected Outcomes  Short Term: Attend rehab on a regular basis to increase amount of physical activity.;Long Term: Add in home exercise to make exercise part of routine and to increase amount of physical activity.;Long Term: Exercising regularly at least 3-5 days a week.       Increase Strength and Stamina  Yes       Intervention  Provide advice, education, support and counseling about physical activity/exercise needs.;Develop an  individualized exercise prescription for aerobic and resistive training based on initial evaluation findings, risk stratification, comorbidities and participant's personal goals.       Expected Outcomes  Short Term: Increase workloads from initial exercise prescription for resistance, speed, and METs.;Short Term: Perform resistance training exercises routinely during rehab and add in resistance training at home;Long Term: Improve cardiorespiratory fitness, muscular endurance and strength as measured by increased METs and functional capacity (6MWT)       Able to understand and use rate of perceived exertion (RPE) scale  Yes       Intervention  Provide education and explanation on how to use RPE scale       Expected Outcomes  Short Term: Able to use RPE daily in rehab to express subjective intensity level;Long Term:  Able to use RPE to guide intensity level when exercising independently       Able to understand and use Dyspnea scale  Yes       Intervention  Provide education and explanation on how to use Dyspnea scale       Expected Outcomes  Short Term: Able to use Dyspnea scale daily in rehab to express subjective sense of shortness of breath during exertion;Long Term: Able to use Dyspnea scale to guide intensity level when exercising independently       Knowledge and understanding of Target Heart Rate Range (THRR)  Yes       Intervention  Provide education and explanation of THRR including how the numbers were predicted and where they are located for reference       Expected Outcomes  Short Term: Able to state/look up THRR;Short Term: Able to use daily as guideline for intensity in rehab;Long Term: Able to use THRR to govern intensity when exercising independently       Able to check pulse independently  Yes       Intervention  Provide education and demonstration on how to check pulse in carotid and radial arteries.;Review the importance of being able to check your own pulse for safety during  independent exercise       Expected Outcomes  Short Term: Able to explain why pulse checking is important during independent exercise;Long Term: Able to check pulse independently and accurately       Understanding of Exercise Prescription  Yes       Intervention  Provide education, explanation, and written materials on patient's individual exercise prescription       Expected Outcomes  Short Term: Able to explain program exercise prescription;Long Term: Able to explain home exercise prescription to  exercise independently          Exercise Goals Re-Evaluation :   Discharge Exercise Prescription (Final Exercise Prescription Changes): Exercise Prescription Changes - 05/26/19 1500      Response to Exercise   Blood Pressure (Admit)  130/74    Blood Pressure (Exercise)  136/64    Blood Pressure (Exit)  126/74    Heart Rate (Admit)  96 bpm    Heart Rate (Exercise)  106 bpm    Heart Rate (Exit)  98 bpm    Oxygen Saturation (Admit)  97 %    Oxygen Saturation (Exercise)  96 %    Rating of Perceived Exertion (Exercise)  7    Symptoms  hip pain 3/10    Comments  walk test results       Nutrition:  Target Goals: Understanding of nutrition guidelines, daily intake of sodium '1500mg'$ , cholesterol '200mg'$ , calories 30% from fat and 7% or less from saturated fats, daily to have 5 or more servings of fruits and vegetables.  Education: Controlling Sodium/Reading Food Labels -Group verbal and written material supporting the discussion of sodium use in heart healthy nutrition. Review and explanation with models, verbal and written materials for utilization of the food label.   Education: General Nutrition Guidelines/Fats and Fiber: -Group instruction provided by verbal, written material, models and posters to present the general guidelines for heart healthy nutrition. Gives an explanation and review of dietary fats and fiber.   Biometrics: Pre Biometrics - 05/26/19 1529      Pre Biometrics    Height  5' 9.1" (1.755 m)    Weight  226 lb 9.6 oz (102.8 kg)    BMI (Calculated)  33.37    Single Leg Stand  14.19 seconds        Nutrition Therapy Plan and Nutrition Goals:   Nutrition Assessments: Nutrition Assessments - 05/26/19 1530      MEDFICTS Scores   Pre Score  48       MEDIFICTS Score Key:          ?70 Need to make dietary changes          40-70 Heart Healthy Diet         ? 40 Therapeutic Level Cholesterol Diet  Nutrition Goals Re-Evaluation:   Nutrition Goals Discharge (Final Nutrition Goals Re-Evaluation):   Psychosocial: Target Goals: Acknowledge presence or absence of significant depression and/or stress, maximize coping skills, provide positive support system. Participant is able to verbalize types and ability to use techniques and skills needed for reducing stress and depression.   Education: Depression - Provides group verbal and written instruction on the correlation between heart/lung disease and depressed mood, treatment options, and the stigmas associated with seeking treatment.   Education: Sleep Hygiene -Provides group verbal and written instruction about how sleep can affect your health.  Define sleep hygiene, discuss sleep cycles and impact of sleep habits. Review good sleep hygiene tips.     Education: Stress and Anxiety: - Provides group verbal and written instruction about the health risks of elevated stress and causes of high stress.  Discuss the correlation between heart/lung disease and anxiety and treatment options. Review healthy ways to manage with stress and anxiety.    Initial Review & Psychosocial Screening: Initial Psych Review & Screening - 05/22/19 1004      Initial Review   Current issues with  None Identified      Family Dynamics   Good Support System?  Yes   Brother lives away,  friends and neighbors and church     Barriers   Psychosocial barriers to participate in program  There are no identifiable barriers or  psychosocial needs.;The patient should benefit from training in stress management and relaxation.      Screening Interventions   Interventions  Encouraged to exercise    Expected Outcomes  Short Term goal: Utilizing psychosocial counselor, staff and physician to assist with identification of specific Stressors or current issues interfering with healing process. Setting desired goal for each stressor or current issue identified.;Long Term Goal: Stressors or current issues are controlled or eliminated.;Short Term goal: Identification and review with participant of any Quality of Life or Depression concerns found by scoring the questionnaire.;Long Term goal: The participant improves quality of Life and PHQ9 Scores as seen by post scores and/or verbalization of changes       Quality of Life Scores:  Quality of Life - 05/26/19 1530      Quality of Life   Select  Quality of Life      Quality of Life Scores   Health/Function Pre  29.2 %    Socioeconomic Pre  27.75 %    Psych/Spiritual Pre  30 %    Family Pre  30 %    GLOBAL Pre  29.12 %      Scores of 19 and below usually indicate a poorer quality of life in these areas.  A difference of  2-3 points is a clinically meaningful difference.  A difference of 2-3 points in the total score of the Quality of Life Index has been associated with significant improvement in overall quality of life, self-image, physical symptoms, and general health in studies assessing change in quality of life.  PHQ-9: Recent Review Flowsheet Data    Depression screen [facility] 2/9 05/26/2019 07/12/2017   Decreased Interest 0 0   Down, Depressed, Hopeless 0 0   PHQ - 2 Score 0 0   Altered sleeping 2 -   Tired, decreased energy 1 -   Change in appetite 0 -   Feeling bad or failure about yourself  0 -   Trouble concentrating 0 -   Moving slowly or fidgety/restless 0 -   Suicidal thoughts 0 -   PHQ-9 Score 3 -   Difficult doing work/chores Not difficult at all -      Interpretation of Total Score  Total Score Depression Severity:  1-4 = Minimal depression, 5-9 = Mild depression, 10-14 = Moderate depression, 15-19 = Moderately severe depression, 20-27 = Severe depression   Psychosocial Evaluation and Intervention: Psychosocial Evaluation - 05/22/19 1025      Psychosocial Evaluation & Interventions   Interventions  Encouraged to exercise with the program and follow exercise prescription    Comments  Mr Payette has no barriers to attending the program. He is ready to start the program. He lives alone and has friends, neighbors and his Miles Costain as his support system. He has a brother that [place]. HIs brother was here when the surgery took place. He should do well with the program.    Expected Outcomes  STG: Attends program consistently, continues to have no barriers to attending the program.  LTG: Maintians the heart healthy lifestyle he learned at the program    Continue Psychosocial Services   Follow up required by staff       Psychosocial Re-Evaluation:   Psychosocial Discharge (Final Psychosocial Re-Evaluation):   Vocational Rehabilitation: Provide vocational rehab assistance to qualifying candidates.   Vocational Rehab  Evaluation & Intervention: Vocational Rehab - 05/22/19 1008      Initial Vocational Rehab Evaluation & Intervention   Assessment shows need for Vocational Rehabilitation  No       Education: Education Goals: Education classes will be provided on a variety of topics geared toward better understanding of heart health and risk factor modification. Participant will state understanding/return demonstration of topics presented as noted by education test scores.  Learning Barriers/Preferences: Learning Barriers/Preferences - 05/22/19 1007      Learning Barriers/Preferences   Learning Barriers  None    Learning Preferences  None       General Cardiac Education Topics:  AED/CPR: - Group verbal and written instruction  with the use of models to demonstrate the basic use of the AED with the basic ABC's of resuscitation.   Anatomy & Physiology of the Heart: - Group verbal and written instruction and models provide basic cardiac anatomy and physiology, with the coronary electrical and arterial systems. Review of Valvular disease and Heart Failure   Cardiac Procedures: - Group verbal and written instruction to review commonly prescribed medications for heart disease. Reviews the medication, class of the drug, and side effects. Includes the steps to properly store meds and maintain the prescription regimen. (beta blockers and nitrates)   Cardiac Medications I: - Group verbal and written instruction to review commonly prescribed medications for heart disease. Reviews the medication, class of the drug, and side effects. Includes the steps to properly store meds and maintain the prescription regimen.   Cardiac Medications II: -Group verbal and written instruction to review commonly prescribed medications for heart disease. Reviews the medication, class of the drug, and side effects. (all other drug classes)    Go Sex-Intimacy & Heart Disease, Get SMART - Goal Setting: - Group verbal and written instruction through game format to discuss heart disease and the return to sexual intimacy. Provides group verbal and written material to discuss and apply goal setting through the application of the S.M.A.R.T. Method.   Other Matters of the Heart: - Provides group verbal, written materials and models to describe Stable Angina and Peripheral Artery. Includes description of the disease process and treatment options available to the cardiac patient.   Infection Prevention: - Provides verbal and written material to individual with discussion of infection control including proper hand washing and proper equipment cleaning during exercise session.   Cardiac Rehab from 05/26/2019 in [facility] Cardiac and Pulmonary Rehab  Date   05/26/19  Educator  [facility]  Instruction Review Code  1- Verbalizes Understanding      Falls Prevention: - Provides verbal and written material to individual with discussion of falls prevention and safety.   Cardiac Rehab from 05/26/2019 in [facility] Cardiac and Pulmonary Rehab  Date  05/26/19  Educator  [facility]  Instruction Review Code  1- Verbalizes Understanding      Other: -Provides group and verbal instruction on various topics (see comments)   Knowledge Questionnaire Score: Knowledge Questionnaire Score - 05/26/19 1530      Knowledge Questionnaire Score   Pre Score  23/26 Education Focus: Nutrition and Exercise       Core Components/Risk Factors/Patient Goals at Admission: Personal Goals and Risk Factors at Admission - 05/26/19 1531      Core Components/Risk Factors/Patient Goals on Admission    Weight Management  Yes;Weight Loss;Obesity    Intervention  Weight Management: Develop a combined nutrition and exercise program designed to reach desired caloric intake, while maintaining appropriate intake of nutrient and  fiber, sodium and fats, and appropriate energy expenditure required for the weight goal.;Weight Management: Provide education and appropriate resources to help participant work on and attain dietary goals.;Weight Management/Obesity: Establish reasonable short term and long term weight goals.;Obesity: Provide education and appropriate resources to help participant work on and attain dietary goals.    Admit Weight  226 lb (102.5 kg)    Goal Weight: Short Term  220 lb (99.8 kg)    Goal Weight: Long Term  216 lb (98 kg)    Expected Outcomes  Short Term: Continue to assess and modify interventions until short term weight is achieved;Long Term: Adherence to nutrition and physical activity/exercise program aimed toward attainment of established weight goal;Weight Loss: Understanding of general recommendations for a balanced deficit meal plan, which promotes 1-2 lb weight loss per week  and includes a negative energy balance of [phone] kcal/d;Understanding recommendations for meals to include 15-35% energy as protein, 25-35% energy from fat, 35-60% energy from carbohydrates, less than '200mg'$  of dietary cholesterol, 20-35 gm of total fiber daily;Understanding of distribution of calorie intake throughout the day with the consumption of 4-5 meals/snacks    Diabetes  Yes    Intervention  Provide education about signs/symptoms and action to take for hypo/hyperglycemia.;Provide education about proper nutrition, including hydration, and aerobic/resistive exercise prescription along with prescribed medications to achieve blood glucose in normal ranges: Fasting glucose 65-99 mg/dL    Expected Outcomes  Short Term: Participant verbalizes understanding of the signs/symptoms and immediate care of hyper/hypoglycemia, proper foot care and importance of medication, aerobic/resistive exercise and nutrition plan for blood glucose control.;Long Term: Attainment of HbA1C < 7%.    Hypertension  Yes    Intervention  Provide education on lifestyle modifcations including regular physical activity/exercise, weight management, moderate sodium restriction and increased consumption of fresh fruit, vegetables, and low fat dairy, alcohol moderation, and smoking cessation.;Monitor prescription use compliance.    Expected Outcomes  Short Term: Continued assessment and intervention until BP is < 140/64m HG in hypertensive participants. < 130/852mHG in hypertensive participants with diabetes, heart failure or chronic kidney disease.;Long Term: Maintenance of blood pressure at goal levels.    Lipids  Yes    Intervention  Provide education and support for participant on nutrition & aerobic/resistive exercise along with prescribed medications to achieve LDL '70mg'$ , HDL >'40mg'$ .    Expected Outcomes  Short Term: Participant states understanding of desired cholesterol values and is compliant with medications prescribed.  Participant is following exercise prescription and nutrition guidelines.;Long Term: Cholesterol controlled with medications as prescribed, with individualized exercise RX and with personalized nutrition plan. Value goals: LDL < '70mg'$ , HDL > 40 mg.       Education:Diabetes - Individual verbal and written instruction to review signs/symptoms of diabetes, desired ranges of glucose level fasting, after meals and with exercise. Acknowledge that pre and post exercise glucose checks will be done for 3 sessions at entry of program.   Card[facility]  Date  05/26/19  Educator  [facility] Review Code  1- Verbalizes Understanding      Education: Know Your Numbers and Risk Factors: -Group verbal and written instruction about important numbers in your health.  Discussion of what are risk factors and how they play a role in the disease process.  Review of Cholesterol, Blood Pressure, Diabetes, and BMI and the role they play in your overall health.   Core Components/Risk Factors/Patient Goals Review:    Core Components/Risk Factors/Patient  Goals at Discharge (Final Review):    ITP Comments: ITP Comments    Row Name 05/22/19 1022 05/26/19 1524         ITP Comments  Completed Virtual Orientation call.  Has appt.on 4/13 with EP for eval and gym orientation.  Diagnosis documentation can be found in [facility] 04/29/2019  Completed 6MWT and gym orientation.  Initial ITP created and sent for review to Dr. Emily Filbert, Medical Director.         Comments: Initial TIP

## 2019-05-26 NOTE — Patient Instructions (Signed)
Patient Instructions  Patient Details  Name: Erik Allen MRN: [id number] Date of Birth: [birth year] Referring Provider:  Wellington Hampshire, MD  Below are your personal goals for exercise, nutrition, and risk factors. Our goal is to help you stay on track towards obtaining and maintaining these goals. We will be discussing your progress on these goals with you throughout the program.  Initial Exercise Prescription: Initial Exercise Prescription - 05/26/19 1500      Date of Initial Exercise RX and Referring Provider   Date  05/26/19    Referring Provider  [name] MD      Treadmill   MPH  1.9    Grade  1    Minutes  15    METs  2.72      Elliptical   Level  1    Speed  2.1    Minutes  15      REL-XR   Level  2    Speed  50    Minutes  15    METs  2.7      Prescription Details   Frequency (times per week)  2    Duration  Progress to 30 minutes of continuous aerobic without signs/symptoms of physical distress      Intensity   THRR 40-80% of Max Heartrate  121-146    Ratings of Perceived Exertion  11-13    Perceived Dyspnea  0-4      Progression   Progression  Continue to progress workloads to maintain intensity without signs/symptoms of physical distress.      Resistance Training   Training Prescription  Yes    Weight  3 lb    Reps  10-15       Exercise Goals: Frequency: Be able to perform aerobic exercise two to three times per week in program working toward 2-5 days per week of home exercise.  Intensity: Work with a perceived exertion of 11 (fairly light) - 15 (hard) while following your exercise prescription.  We will make changes to your prescription with you as you progress through the program.   Duration: Be able to do 30 to 45 minutes of continuous aerobic exercise in addition to a 5 minute warm-up and a 5 minute cool-down routine.   Nutrition Goals: Your personal nutrition goals will be established when you do your nutrition analysis with the  dietician.  The following are general nutrition guidelines to follow: Cholesterol < 200mg /day Sodium < 1500mg /day Fiber: Men over 50 yrs - 30 grams per day  Personal Goals: Personal Goals and Risk Factors at Admission - 05/26/19 1531      Core Components/Risk Factors/Patient Goals on Admission    Weight Management  Yes;Weight Loss;Obesity    Intervention  Weight Management: Develop a combined nutrition and exercise program designed to reach desired caloric intake, while maintaining appropriate intake of nutrient and fiber, sodium and fats, and appropriate energy expenditure required for the weight goal.;Weight Management: Provide education and appropriate resources to help participant work on and attain dietary goals.;Weight Management/Obesity: Establish reasonable short term and long term weight goals.;Obesity: Provide education and appropriate resources to help participant work on and attain dietary goals.    Admit Weight  226 lb (102.5 kg)    Goal Weight: Short Term  220 lb (99.8 kg)    Goal Weight: Long Term  216 lb (98 kg)    Expected Outcomes  Short Term: Continue to assess and modify interventions until short term weight is achieved;Long Term:  Adherence to nutrition and physical activity/exercise program aimed toward attainment of established weight goal;Weight Loss: Understanding of general recommendations for a balanced deficit meal plan, which promotes 1-2 lb weight loss per week and includes a negative energy balance of [phone] kcal/d;Understanding recommendations for meals to include 15-35% energy as protein, 25-35% energy from fat, 35-60% energy from carbohydrates, less than 200mg  of dietary cholesterol, 20-35 gm of total fiber daily;Understanding of distribution of calorie intake throughout the day with the consumption of 4-5 meals/snacks    Diabetes  Yes    Intervention  Provide education about signs/symptoms and action to take for hypo/hyperglycemia.;Provide education about proper  nutrition, including hydration, and aerobic/resistive exercise prescription along with prescribed medications to achieve blood glucose in normal ranges: Fasting glucose 65-99 mg/dL    Expected Outcomes  Short Term: Participant verbalizes understanding of the signs/symptoms and immediate care of hyper/hypoglycemia, proper foot care and importance of medication, aerobic/resistive exercise and nutrition plan for blood glucose control.;Long Term: Attainment of HbA1C < 7%.    Hypertension  Yes    Intervention  Provide education on lifestyle modifcations including regular physical activity/exercise, weight management, moderate sodium restriction and increased consumption of fresh fruit, vegetables, and low fat dairy, alcohol moderation, and smoking cessation.;Monitor prescription use compliance.    Expected Outcomes  Short Term: Continued assessment and intervention until BP is < 140/56mm HG in hypertensive participants. < 130/40mm HG in hypertensive participants with diabetes, heart failure or chronic kidney disease.;Long Term: Maintenance of blood pressure at goal levels.    Lipids  Yes    Intervention  Provide education and support for participant on nutrition & aerobic/resistive exercise along with prescribed medications to achieve LDL 70mg , HDL >40mg .    Expected Outcomes  Short Term: Participant states understanding of desired cholesterol values and is compliant with medications prescribed. Participant is following exercise prescription and nutrition guidelines.;Long Term: Cholesterol controlled with medications as prescribed, with individualized exercise RX and with personalized nutrition plan. Value goals: LDL < 70mg , HDL > 40 mg.       Tobacco Use Initial Evaluation: Social History   Tobacco Use  Smoking Status Never Smoker  Smokeless Tobacco Never Used    Exercise Goals and Review: Exercise Goals    Row Name 05/26/19 1528             Exercise Goals   Increase Physical Activity  Yes        Intervention  Provide advice, education, support and counseling about physical activity/exercise needs.;Develop an individualized exercise prescription for aerobic and resistive training based on initial evaluation findings, risk stratification, comorbidities and participant's personal goals.       Expected Outcomes  Short Term: Attend rehab on a regular basis to increase amount of physical activity.;Long Term: Add in home exercise to make exercise part of routine and to increase amount of physical activity.;Long Term: Exercising regularly at least 3-5 days a week.       Increase Strength and Stamina  Yes       Intervention  Provide advice, education, support and counseling about physical activity/exercise needs.;Develop an individualized exercise prescription for aerobic and resistive training based on initial evaluation findings, risk stratification, comorbidities and participant's personal goals.       Expected Outcomes  Short Term: Increase workloads from initial exercise prescription for resistance, speed, and METs.;Short Term: Perform resistance training exercises routinely during rehab and add in resistance training at home;Long Term: Improve cardiorespiratory fitness, muscular endurance and strength as measured by increased METs  and functional capacity (6MWT)       Able to understand and use rate of perceived exertion (RPE) scale  Yes       Intervention  Provide education and explanation on how to use RPE scale       Expected Outcomes  Short Term: Able to use RPE daily in rehab to express subjective intensity level;Long Term:  Able to use RPE to guide intensity level when exercising independently       Able to understand and use Dyspnea scale  Yes       Intervention  Provide education and explanation on how to use Dyspnea scale       Expected Outcomes  Short Term: Able to use Dyspnea scale daily in rehab to express subjective sense of shortness of breath during exertion;Long Term: Able to use  Dyspnea scale to guide intensity level when exercising independently       Knowledge and understanding of Target Heart Rate Range (THRR)  Yes       Intervention  Provide education and explanation of THRR including how the numbers were predicted and where they are located for reference       Expected Outcomes  Short Term: Able to state/look up THRR;Short Term: Able to use daily as guideline for intensity in rehab;Long Term: Able to use THRR to govern intensity when exercising independently       Able to check pulse independently  Yes       Intervention  Provide education and demonstration on how to check pulse in carotid and radial arteries.;Review the importance of being able to check your own pulse for safety during independent exercise       Expected Outcomes  Short Term: Able to explain why pulse checking is important during independent exercise;Long Term: Able to check pulse independently and accurately       Understanding of Exercise Prescription  Yes       Intervention  Provide education, explanation, and written materials on patient's individual exercise prescription       Expected Outcomes  Short Term: Able to explain program exercise prescription;Long Term: Able to explain home exercise prescription to exercise independently          Copy of goals given to participant.

## 2019-05-27 ENCOUNTER — Other Ambulatory Visit: Payer: Self-pay | Admitting: Physician Assistant

## 2019-05-28 ENCOUNTER — Other Ambulatory Visit: Payer: Self-pay

## 2019-05-28 ENCOUNTER — Encounter: Payer: BC Managed Care – PPO | Admitting: *Deleted

## 2019-05-28 DIAGNOSIS — Z952 Presence of prosthetic heart valve: Secondary | ICD-10-CM

## 2019-05-28 DIAGNOSIS — Z953 Presence of xenogenic heart valve: Secondary | ICD-10-CM | POA: Diagnosis not present

## 2019-05-28 LAB — GLUCOSE, CAPILLARY
Glucose-Capillary: 160 mg/dL — ABNORMAL HIGH (ref 70–99)
Glucose-Capillary: 106 mg/dL — ABNORMAL HIGH (ref 70–99)

## 2019-05-28 NOTE — Progress Notes (Signed)
Daily Session Note  Patient Details  Name: Erik Allen MRN: [id number] Date of Birth: 1955-03-24 Referring Provider:     Cardiac Rehab from 05/26/2019 in [facility] Cardiac and Pulmonary Rehab  Referring Provider  [name] MD      Encounter Date: 05/28/2019  Check In: Session Check In - 05/28/19 0817      Check-In   Supervising physician immediately available to respond to emergencies  See telemetry face sheet for immediately available ER MD    Location  [facility]-Cardiac & Pulmonary Rehab    Staff Present  Heath Lark, RN, BSN, CCRP;Laureen Owens Shark, BS, RRT, CPFT;Amanda Oletta Darter, BA, ACSM CEP, Exercise Physiologist    Virtual Visit  No    Medication changes reported      No    Fall or balance concerns reported     No    Warm-up and Cool-down  Performed on first and last piece of equipment    Resistance Training Performed  Yes    VAD Patient?  No    PAD/SET Patient?  No      Pain Assessment   Currently in Pain?  No/denies          Social History   Tobacco Use  Smoking Status Never Smoker  Smokeless Tobacco Never Used    Goals Met:  Exercise tolerated well Personal goals reviewed No report of cardiac concerns or symptoms  Goals Unmet:  Not Applicable  Comments: First full day of exercise!  Patient was oriented to gym and equipment including functions, settings, policies, and procedures.  Patient's individual exercise prescription and treatment plan were reviewed.  All starting workloads were established based on the results of the 6 minute walk test done at initial orientation visit.  The plan for exercise progression was also introduced and progression will be customized based on patient's performance and goals.    Dr. Emily Filbert is Medical Director for [facility] and [facility].

## 2019-06-02 ENCOUNTER — Other Ambulatory Visit: Payer: Self-pay

## 2019-06-02 ENCOUNTER — Encounter: Payer: BC Managed Care – PPO | Admitting: *Deleted

## 2019-06-02 DIAGNOSIS — Z952 Presence of prosthetic heart valve: Secondary | ICD-10-CM

## 2019-06-02 DIAGNOSIS — Z953 Presence of xenogenic heart valve: Secondary | ICD-10-CM | POA: Diagnosis not present

## 2019-06-02 LAB — GLUCOSE, CAPILLARY
Glucose-Capillary: 166 mg/dL — ABNORMAL HIGH (ref 70–99)
Glucose-Capillary: 94 mg/dL (ref 70–99)

## 2019-06-02 NOTE — Progress Notes (Signed)
Daily Session Note  Patient Details  Name: Erik Allen MRN: [id number] Date of Birth: September 17, 1955 Referring Provider:     Cardiac Rehab from 05/26/2019 in [facility] Cardiac and Pulmonary Rehab  Referring Provider  [name] MD      Encounter Date: 06/02/2019  Check In: Session Check In - 06/02/19 0901      Check-In   Supervising physician immediately available to respond to emergencies  See telemetry face sheet for immediately available ER MD    Location  [facility]-Cardiac & Pulmonary Rehab    Staff Present  Heath Lark, RN, BSN, CCRP;Amanda Sommer, BA, ACSM CEP, Exercise Physiologist;Joseph Hood RCP,RRT,BSRT    Virtual Visit  No    Medication changes reported      No    Fall or balance concerns reported     No    Warm-up and Cool-down  Performed on first and last piece of equipment    Resistance Training Performed  Yes    VAD Patient?  No    PAD/SET Patient?  No      Pain Assessment   Currently in Pain?  No/denies          Social History   Tobacco Use  Smoking Status Never Smoker  Smokeless Tobacco Never Used    Goals Met:  Independence with exercise equipment Exercise tolerated well No report of cardiac concerns or symptoms  Goals Unmet:  Not Applicable  Comments: Pt able to follow exercise prescription today without complaint.  Will continue to monitor for progression.    Dr. Emily Filbert is Medical Director for [place] and [facility].

## 2019-06-03 ENCOUNTER — Encounter: Payer: Self-pay | Admitting: *Deleted

## 2019-06-03 DIAGNOSIS — Z952 Presence of prosthetic heart valve: Secondary | ICD-10-CM

## 2019-06-03 NOTE — Progress Notes (Signed)
Cardiac Individual Treatment Plan  Patient Details  Name: Erik Allen MRN: [id number] Date of Birth: 11/29/1955 Referring Provider:     Cardiac Rehab from 05/26/2019 in [facility] Cardiac and Pulmonary Rehab  Referring Provider  [name] MD      Initial Encounter Date:    Cardiac Rehab from 05/26/2019 in [facility] Cardiac and Pulmonary Rehab  Date  05/26/19      Visit Diagnosis: S/P aortic valve replacement  Patient's Home Medications on Admission:  Current Outpatient Medications:  .  Accu-Chek FastClix Lancets MISC, Use as directed to check blood sugar daily, Disp: 100 each, Rfl: 1 .  ACCU-CHEK GUIDE test strip, USE AS INSTRUCTED, Disp: 100 strip, Rfl: 1 .  albuterol (PROVENTIL) (2.5 MG/3ML) 0.083% nebulizer solution, USE 1 VIAL VIA NEBULIZER EVERY 6 HOURS AS NEEDED (Patient taking differently: Take 2.5 mg by nebulization every 6 (six) hours as needed for wheezing or shortness of breath. USE 1 VIAL VIA NEBULIZER EVERY 6 HOURS AS NEEDE), Disp: 150 mL, Rfl: 3 .  albuterol (VENTOLIN HFA) 108 (90 Base) MCG/ACT inhaler, INHALE 2 PUFFS INTO THE LUNGS EVERY 6 HOURS AS NEEDED FOR WHEEZING OR SHORTNESS OF BREATH, Disp: 18 g, Rfl: 4 .  aspirin EC 325 MG EC tablet, Take 1 tablet (325 mg total) by mouth daily., Disp: 30 tablet, Rfl: 0 .  Blood Glucose Monitoring Suppl (ACCU-CHEK GUIDE) w/Device KIT, 1 each by Does not apply route as directed., Disp: 1 kit, Rfl: 0 .  Cholecalciferol (VITAMIN D) 50 MCG (2000 UT) CAPS, Take 2,000 Units by mouth in the morning and at bedtime. , Disp: , Rfl:  .  cyanocobalamin (V-R VITAMIN B-12) 500 MCG tablet, Take 1 tablet (500 mcg total) by mouth daily., Disp: , Rfl:  .  finasteride (PROSCAR) 5 MG tablet, TAKE 1 TABLET BY MOUTH EVERY DAY, Disp: 90 tablet, Rfl: 1 .  Fluticasone-Salmeterol (ADVAIR DISKUS) 250-50 MCG/DOSE AEPB, INHALE 1 PUFF BY MOUTH TWICE DAILY. RINSE MOUTH AFTER USE, Disp: 180 each, Rfl: 1 .  metFORMIN (GLUCOPHAGE) 500 MG tablet, TAKE 1 TABLET (500 MG  TOTAL) BY MOUTH 2 (TWO) TIMES DAILY WITH A MEAL., Disp: 180 tablet, Rfl: 0 .  metoprolol tartrate (LOPRESSOR) 50 MG tablet, Take 1 tablet (50 mg total) by mouth 2 (two) times daily. (Patient taking differently: Take 75 mg by mouth 2 (two) times daily. ), Disp: 60 tablet, Rfl: 1 .  montelukast (SINGULAIR) 10 MG tablet, TAKE 1 TABLET BY MOUTH EVERY NIGHT AT BEDTIME (Patient taking differently: Take 10 mg by mouth at bedtime. ), Disp: 90 tablet, Rfl: 2 .  oxyCODONE (OXY IR/ROXICODONE) 5 MG immediate release tablet, Take 1 tablet (5 mg total) by mouth every 6 (six) hours as needed for severe pain. (Patient not taking: Reported on 05/22/2019), Disp: 30 tablet, Rfl: 0 .  pantoprazole (PROTONIX) 40 MG tablet, Take 40 mg by mouth at bedtime., Disp: , Rfl:  .  simvastatin (ZOCOR) 80 MG tablet, TAKE 1 TABLET BY MOUTH EVERYDAY AT BEDTIME (Patient taking differently: Take 80 mg by mouth at bedtime. ), Disp: 90 tablet, Rfl: 3 .  valsartan-hydrochlorothiazide (DIOVAN-HCT) 160-25 MG tablet, Take 1 tablet by mouth daily., Disp: 90 tablet, Rfl: 1  Past Medical History: Past Medical History:  Diagnosis Date  . Aortic stenosis 06/21/2014   Echo 01/2018: Mild LVH, normal sys fxn EF 55-60%, G1DD, moderate AS with mildly dilated LA. rec yearly echocardiogram  . Asthma 09/13/99  . BENIGN PROSTATIC HYPERTROPHY 05/14/2003  . CAP (community acquired pneumonia) 01/31/2015  .  Diabetes mellitus without complication ([place])   . Disorder of vocal cord 2012   h/o thrush on vocal cords per patient  . GERD (gastroesophageal reflux disease) 10/13/01   ENT eval for GERD and thrush 2011  . History of kidney stones   . HTN (hypertension)   . Hyperlipemia 10/13/97  . S/P minimally invasive aortic valve replacement with bioprosthetic valve 04/29/2019   Edwards Inspiris Resilia stented bovine pericardial tissue valve (size 23 mm)  . Seasonal allergic rhinitis    worse in spring  . Systolic murmur   . Transaminitis 2014   presumed fatty  liver without [place], normal iron and viral hep panel    Tobacco Use: Social History   Tobacco Use  Smoking Status Never Smoker  Smokeless Tobacco Never Used    Labs: Recent Review Flowsheet Data    Labs for ITP Cardiac and Pulmonary Rehab Latest Ref Rng & Units 04/29/2019 04/30/2019 04/30/2019 04/30/2019 05/01/2019   Cholestrol 0 - 200 mg/dL - - - - -   LDLCALC 0 - 99 mg/dL - - - - -   LDLDIRECT mg/dL - - - - -   HDL >39.00 mg/dL - - - - -   Trlycerides 0.0 - 149.0 mg/dL - - - - -   Hemoglobin A1c 4.8 - 5.6 % - - - - -   PHART 7.350 - 7.450 7.368 7.306(L) 7.328(L) - -   PCO2ART 32.0 - 48.0 mmHg 38.1 43.1 40.0 - -   HCO3 20.0 - 28.0 mmol/L 21.9 21.5 20.9 - -   TCO2 22 - 32 mmol/L '23 23 22 '$ - -   ACIDBASEDEF 0.0 - 2.0 mmol/L 3.0(H) 4.0(H) 5.0(H) - -   O2SAT % 95.0 93.0 91.0 59.5 65.0       Exercise Target Goals: Exercise Program Goal: Individual exercise prescription set using results from initial 6 min walk test and THRR while considering  patient's activity barriers and safety.   Exercise Prescription Goal: Initial exercise prescription builds to 30-45 minutes a day of aerobic activity, 2-3 days per week.  Home exercise guidelines will be given to patient during program as part of exercise prescription that the participant will acknowledge.   Education: Aerobic Exercise & Resistance Training: - Gives group verbal and written instruction on the various components of exercise. Focuses on aerobic and resistive training programs and the benefits of this training and how to safely progress through these programs..   Education: Exercise & Equipment Safety: - Individual verbal instruction and demonstration of equipment use and safety with use of the equipment.   Cardiac Rehab from 05/26/2019 in [facility] Cardiac and Pulmonary Rehab  Date  05/26/19  Educator  [facility]  Instruction Review Code  1- Verbalizes Understanding      Education: Exercise Physiology & General Exercise Guidelines: -  Group verbal and written instruction with models to review the exercise physiology of the cardiovascular system and associated critical values. Provides general exercise guidelines with specific guidelines to those with heart or lung disease.    Education: Flexibility, Balance, Mind/Body Relaxation: Provides group verbal/written instruction on the benefits of flexibility and balance training, including mind/body exercise modes such as yoga, pilates and tai chi.  Demonstration and skill practice provided.   Activity Barriers & Risk Stratification: Activity Barriers & Cardiac Risk Stratification - 05/26/19 1526      Activity Barriers & Cardiac Risk Stratification   Activity Barriers  Deconditioning;Balance Concerns;Muscular Weakness;Joint Problems    Cardiac Risk Stratification  Low  6 Minute Walk: 6 Minute Walk    Row Name 05/26/19 1525         6 Minute Walk   Phase  Initial     Distance  1005 feet     Walk Time  6 minutes     # of Rest Breaks  0     MPH  1.9     METS  2.65     RPE  7     VO2 Peak  9.26     Symptoms  Yes (comment)     Comments  hip pain 3/10     Resting HR  96 bpm     Resting BP  130/74     Resting Oxygen Saturation   97 %     Exercise Oxygen Saturation  during 6 min walk  96 %     Max Ex. HR  106 bpm     Max Ex. BP  136/64     2 Minute Post BP  126/74        Oxygen Initial Assessment:   Oxygen Re-Evaluation:   Oxygen Discharge (Final Oxygen Re-Evaluation):   Initial Exercise Prescription: Initial Exercise Prescription - 05/26/19 1500      Date of Initial Exercise RX and Referring Provider   Date  05/26/19    Referring Provider  [name] MD      Treadmill   MPH  1.9    Grade  1    Minutes  15    METs  2.72      Elliptical   Level  1    Speed  2.1    Minutes  15      REL-XR   Level  2    Speed  50    Minutes  15    METs  2.7      Prescription Details   Frequency (times per week)  2    Duration  Progress to 30  minutes of continuous aerobic without signs/symptoms of physical distress      Intensity   THRR 40-80% of Max Heartrate  121-146    Ratings of Perceived Exertion  11-13    Perceived Dyspnea  0-4      Progression   Progression  Continue to progress workloads to maintain intensity without signs/symptoms of physical distress.      Resistance Training   Training Prescription  Yes    Weight  3 lb    Reps  10-15       Perform Capillary Blood Glucose checks as needed.  Exercise Prescription Changes: Exercise Prescription Changes    Row Name 05/26/19 1500 06/02/19 1400           Response to Exercise   Blood Pressure (Admit)  130/74  128/66      Blood Pressure (Exercise)  136/64  158/56      Blood Pressure (Exit)  126/74  104/66      Heart Rate (Admit)  96 bpm  127 bpm      Heart Rate (Exercise)  106 bpm  163 bpm      Heart Rate (Exit)  98 bpm  139 bpm      Oxygen Saturation (Admit)  97 %  --      Oxygen Saturation (Exercise)  96 %  --      Rating of Perceived Exertion (Exercise)  7  15      Symptoms  hip pain 3/10  --  Comments  walk test results  second day        Resistance Training   Training Prescription  --  Yes      Weight  --  3 lb      Reps  --  10-15        Interval Training   Interval Training  --  No        Treadmill   MPH  --  1.9      Grade  --  1      Minutes  --  15      METs  --  2.72        Elliptical   Level  --  1      Minutes  --  15 HR high at 12 min - back to TM         Exercise Comments: Exercise Comments    Row Name 05/28/19 0818           Exercise Comments  First full day of exercise!  Patient was oriented to gym and equipment including functions, settings, policies, and procedures.  Patient's individual exercise prescription and treatment plan were reviewed.  All starting workloads were established based on the results of the 6 minute walk test done at initial orientation visit.  The plan for exercise progression was also  introduced and progression will be customized based on patient's performance and goals.          Exercise Goals and Review: Exercise Goals    Row Name 05/26/19 1528             Exercise Goals   Increase Physical Activity  Yes       Intervention  Provide advice, education, support and counseling about physical activity/exercise needs.;Develop an individualized exercise prescription for aerobic and resistive training based on initial evaluation findings, risk stratification, comorbidities and participant's personal goals.       Expected Outcomes  Short Term: Attend rehab on a regular basis to increase amount of physical activity.;Long Term: Add in home exercise to make exercise part of routine and to increase amount of physical activity.;Long Term: Exercising regularly at least 3-5 days a week.       Increase Strength and Stamina  Yes       Intervention  Provide advice, education, support and counseling about physical activity/exercise needs.;Develop an individualized exercise prescription for aerobic and resistive training based on initial evaluation findings, risk stratification, comorbidities and participant's personal goals.       Expected Outcomes  Short Term: Increase workloads from initial exercise prescription for resistance, speed, and METs.;Short Term: Perform resistance training exercises routinely during rehab and add in resistance training at home;Long Term: Improve cardiorespiratory fitness, muscular endurance and strength as measured by increased METs and functional capacity (6MWT)       Able to understand and use rate of perceived exertion (RPE) scale  Yes       Intervention  Provide education and explanation on how to use RPE scale       Expected Outcomes  Short Term: Able to use RPE daily in rehab to express subjective intensity level;Long Term:  Able to use RPE to guide intensity level when exercising independently       Able to understand and use Dyspnea scale  Yes        Intervention  Provide education and explanation on how to use Dyspnea scale       Expected Outcomes  Short Term:  Able to use Dyspnea scale daily in rehab to express subjective sense of shortness of breath during exertion;Long Term: Able to use Dyspnea scale to guide intensity level when exercising independently       Knowledge and understanding of Target Heart Rate Range (THRR)  Yes       Intervention  Provide education and explanation of THRR including how the numbers were predicted and where they are located for reference       Expected Outcomes  Short Term: Able to state/look up THRR;Short Term: Able to use daily as guideline for intensity in rehab;Long Term: Able to use THRR to govern intensity when exercising independently       Able to check pulse independently  Yes       Intervention  Provide education and demonstration on how to check pulse in carotid and radial arteries.;Review the importance of being able to check your own pulse for safety during independent exercise       Expected Outcomes  Short Term: Able to explain why pulse checking is important during independent exercise;Long Term: Able to check pulse independently and accurately       Understanding of Exercise Prescription  Yes       Intervention  Provide education, explanation, and written materials on patient's individual exercise prescription       Expected Outcomes  Short Term: Able to explain program exercise prescription;Long Term: Able to explain home exercise prescription to exercise independently          Exercise Goals Re-Evaluation : Exercise Goals Re-Evaluation    Row Name 05/28/19 0819 06/02/19 1510           Exercise Goal Re-Evaluation   Exercise Goals Review  Able to understand and use rate of perceived exertion (RPE) scale;Knowledge and understanding of Target Heart Rate Range (THRR);Able to check pulse independently;Understanding of Exercise Prescription  Increase Physical Activity;Increase Strength and  Stamina;Able to understand and use rate of perceived exertion (RPE) scale;Able to understand and use Dyspnea scale;Knowledge and understanding of Target Heart Rate Range (THRR);Able to check pulse independently;Understanding of Exercise Prescription      Comments  Reviewed RPE scale, THR and program prescription with pt today.  Pt voiced understanding and was given a copy of goals to take home.  The elliptical has been challenging for [place] but he has been able to add more time on elliptical.      Expected Outcomes  Short: Use RPE daily to regulate intensity. Long: Follow program prescription in THR.  Short:  build up to 15 min on elliptical Long: increase overall stamina         Discharge Exercise Prescription (Final Exercise Prescription Changes): Exercise Prescription Changes - 06/02/19 1400      Response to Exercise   Blood Pressure (Admit)  128/66    Blood Pressure (Exercise)  158/56    Blood Pressure (Exit)  104/66    Heart Rate (Admit)  127 bpm    Heart Rate (Exercise)  163 bpm    Heart Rate (Exit)  139 bpm    Rating of Perceived Exertion (Exercise)  15    Comments  second day      Resistance Training   Training Prescription  Yes    Weight  3 lb    Reps  10-15      Interval Training   Interval Training  No      Treadmill   MPH  1.9    Grade  1  Minutes  15    METs  2.72      Elliptical   Level  1    Minutes  15   HR high at 12 min - back to TM      Nutrition:  Target Goals: Understanding of nutrition guidelines, daily intake of sodium '1500mg'$ , cholesterol '200mg'$ , calories 30% from fat and 7% or less from saturated fats, daily to have 5 or more servings of fruits and vegetables.  Education: Controlling Sodium/Reading Food Labels -Group verbal and written material supporting the discussion of sodium use in heart healthy nutrition. Review and explanation with models, verbal and written materials for utilization of the food label.   Education: General Nutrition  Guidelines/Fats and Fiber: -Group instruction provided by verbal, written material, models and posters to present the general guidelines for heart healthy nutrition. Gives an explanation and review of dietary fats and fiber.   Biometrics: Pre Biometrics - 05/26/19 1529      Pre Biometrics   Height  5' 9.1" (1.755 m)    Weight  226 lb 9.6 oz (102.8 kg)    BMI (Calculated)  33.37    Single Leg Stand  14.19 seconds        Nutrition Therapy Plan and Nutrition Goals:   Nutrition Assessments: Nutrition Assessments - 05/26/19 1530      MEDFICTS Scores   Pre Score  48       MEDIFICTS Score Key:          ?70 Need to make dietary changes          40-70 Heart Healthy Diet         ? 40 Therapeutic Level Cholesterol Diet  Nutrition Goals Re-Evaluation:   Nutrition Goals Discharge (Final Nutrition Goals Re-Evaluation):   Psychosocial: Target Goals: Acknowledge presence or absence of significant depression and/or stress, maximize coping skills, provide positive support system. Participant is able to verbalize types and ability to use techniques and skills needed for reducing stress and depression.   Education: Depression - Provides group verbal and written instruction on the correlation between heart/lung disease and depressed mood, treatment options, and the stigmas associated with seeking treatment.   Education: Sleep Hygiene -Provides group verbal and written instruction about how sleep can affect your health.  Define sleep hygiene, discuss sleep cycles and impact of sleep habits. Review good sleep hygiene tips.     Education: Stress and Anxiety: - Provides group verbal and written instruction about the health risks of elevated stress and causes of high stress.  Discuss the correlation between heart/lung disease and anxiety and treatment options. Review healthy ways to manage with stress and anxiety.    Initial Review & Psychosocial Screening: Initial Psych Review &  Screening - 05/22/19 1004      Initial Review   Current issues with  None Identified      Family Dynamics   Good Support System?  Yes   Brother lives away, friends and neighbors and church     Barriers   Psychosocial barriers to participate in program  There are no identifiable barriers or psychosocial needs.;The patient should benefit from training in stress management and relaxation.      Screening Interventions   Interventions  Encouraged to exercise    Expected Outcomes  Short Term goal: Utilizing psychosocial counselor, staff and physician to assist with identification of specific Stressors or current issues interfering with healing process. Setting desired goal for each stressor or current issue identified.;Long Term Goal: Stressors or current  issues are controlled or eliminated.;Short Term goal: Identification and review with participant of any Quality of Life or Depression concerns found by scoring the questionnaire.;Long Term goal: The participant improves quality of Life and PHQ9 Scores as seen by post scores and/or verbalization of changes       Quality of Life Scores:  Quality of Life - 05/26/19 1530      Quality of Life   Select  Quality of Life      Quality of Life Scores   Health/Function Pre  29.2 %    Socioeconomic Pre  27.75 %    Psych/Spiritual Pre  30 %    Family Pre  30 %    GLOBAL Pre  29.12 %      Scores of 19 and below usually indicate a poorer quality of life in these areas.  A difference of  2-3 points is a clinically meaningful difference.  A difference of 2-3 points in the total score of the Quality of Life Index has been associated with significant improvement in overall quality of life, self-image, physical symptoms, and general health in studies assessing change in quality of life.  PHQ-9: Recent Review Flowsheet Data    Depression screen [facility] 2/9 05/26/2019 07/12/2017   Decreased Interest 0 0   Down, Depressed, Hopeless 0 0   PHQ - 2 Score 0 0    Altered sleeping 2 -   Tired, decreased energy 1 -   Change in appetite 0 -   Feeling bad or failure about yourself  0 -   Trouble concentrating 0 -   Moving slowly or fidgety/restless 0 -   Suicidal thoughts 0 -   PHQ-9 Score 3 -   Difficult doing work/chores Not difficult at all -     Interpretation of Total Score  Total Score Depression Severity:  1-4 = Minimal depression, 5-9 = Mild depression, 10-14 = Moderate depression, 15-19 = Moderately severe depression, 20-27 = Severe depression   Psychosocial Evaluation and Intervention: Psychosocial Evaluation - 05/22/19 1025      Psychosocial Evaluation & Interventions   Interventions  Encouraged to exercise with the program and follow exercise prescription    Comments  Mr Dinneen has no barriers to attending the program. He is ready to start the program. He lives alone and has friends, neighbors and his Miles [place]ain as his support system. He has a brother that lives [place]. HIs brother was here when the surgery took place. He should do well with the program.    Expected Outcomes  STG: Attends program consistently, continues to have no barriers to attending the program.  LTG: Maintians the heart healthy lifestyle he learned at the program    Continue Psychosocial Services   Follow up required by staff       Psychosocial Re-Evaluation:   Psychosocial Discharge (Final Psychosocial Re-Evaluation):   Vocational Rehabilitation: Provide vocational rehab assistance to qualifying candidates.   Vocational Rehab Evaluation & Intervention: Vocational Rehab - 05/22/19 1008      Initial Vocational Rehab Evaluation & Intervention   Assessment shows need for Vocational Rehabilitation  No       Education: Education Goals: Education classes will be provided on a variety of topics geared toward better understanding of heart health and risk factor modification. Participant will state understanding/return demonstration of topics presented as noted by  education test scores.  Learning Barriers/Preferences: Learning Barriers/Preferences - 05/22/19 1007      Learning Barriers/Preferences   Learning Barriers  None  Learning Preferences  None       General Cardiac Education Topics:  AED/CPR: - Group verbal and written instruction with the use of models to demonstrate the basic use of the AED with the basic ABC's of resuscitation.   Anatomy & Physiology of the Heart: - Group verbal and written instruction and models provide basic cardiac anatomy and physiology, with the coronary electrical and arterial systems. Review of Valvular disease and Heart Failure   Cardiac Procedures: - Group verbal and written instruction to review commonly prescribed medications for heart disease. Reviews the medication, class of the drug, and side effects. Includes the steps to properly store meds and maintain the prescription regimen. (beta blockers and nitrates)   Cardiac Medications I: - Group verbal and written instruction to review commonly prescribed medications for heart disease. Reviews the medication, class of the drug, and side effects. Includes the steps to properly store meds and maintain the prescription regimen.   Cardiac Medications II: -Group verbal and written instruction to review commonly prescribed medications for heart disease. Reviews the medication, class of the drug, and side effects. (all other drug classes)    Go Sex-Intimacy & Heart Disease, Get SMART - Goal Setting: - Group verbal and written instruction through game format to discuss heart disease and the return to sexual intimacy. Provides group verbal and written material to discuss and apply goal setting through the application of the S.M.A.R.T. Method.   Other Matters of the Heart: - Provides group verbal, written materials and models to describe Stable Angina and Peripheral Artery. Includes description of the disease process and treatment options available to the  cardiac patient.   Infection Prevention: - Provides verbal and written material to individual with discussion of infection control including proper hand washing and proper equipment cleaning during exercise session.   Cardiac Rehab from 05/26/2019 in [facility] Cardiac and Pulmonary Rehab  Date  05/26/19  Educator  [facility]  Instruction Review Code  1- Verbalizes Understanding      Falls Prevention: - Provides verbal and written material to individual with discussion of falls prevention and safety.   Cardiac Rehab from 05/26/2019 in [facility] Cardiac and Pulmonary Rehab  Date  05/26/19  Educator  [facility]  Instruction Review Code  1- Verbalizes Understanding      Other: -Provides group and verbal instruction on various topics (see comments)   Knowledge Questionnaire Score: Knowledge Questionnaire Score - 05/26/19 1530      Knowledge Questionnaire Score   Pre Score  23/26 Education Focus: Nutrition and Exercise       Core Components/Risk Factors/Patient Goals at Admission: Personal Goals and Risk Factors at Admission - 05/26/19 1531      Core Components/Risk Factors/Patient Goals on Admission    Weight Management  Yes;Weight Loss;Obesity    Intervention  Weight Management: Develop a combined nutrition and exercise program designed to reach desired caloric intake, while maintaining appropriate intake of nutrient and fiber, sodium and fats, and appropriate energy expenditure required for the weight goal.;Weight Management: Provide education and appropriate resources to help participant work on and attain dietary goals.;Weight Management/Obesity: Establish reasonable short term and long term weight goals.;Obesity: Provide education and appropriate resources to help participant work on and attain dietary goals.    Admit Weight  226 lb (102.5 kg)    Goal Weight: Short Term  220 lb (99.8 kg)    Goal Weight: Long Term  216 lb (98 kg)    Expected Outcomes  Short Term: Continue to assess and  modify  interventions until short term weight is achieved;Long Term: Adherence to nutrition and physical activity/exercise program aimed toward attainment of established weight goal;Weight Loss: Understanding of general recommendations for a balanced deficit meal plan, which promotes 1-2 lb weight loss per week and includes a negative energy balance of [phone] kcal/d;Understanding recommendations for meals to include 15-35% energy as protein, 25-35% energy from fat, 35-60% energy from carbohydrates, less than '200mg'$  of dietary cholesterol, 20-35 gm of total fiber daily;Understanding of distribution of calorie intake throughout the day with the consumption of 4-5 meals/snacks    Diabetes  Yes    Intervention  Provide education about signs/symptoms and action to take for hypo/hyperglycemia.;Provide education about proper nutrition, including hydration, and aerobic/resistive exercise prescription along with prescribed medications to achieve blood glucose in normal ranges: Fasting glucose 65-99 mg/dL    Expected Outcomes  Short Term: Participant verbalizes understanding of the signs/symptoms and immediate care of hyper/hypoglycemia, proper foot care and importance of medication, aerobic/resistive exercise and nutrition plan for blood glucose control.;Long Term: Attainment of HbA1C < 7%.    Hypertension  Yes    Intervention  Provide education on lifestyle modifcations including regular physical activity/exercise, weight management, moderate sodium restriction and increased consumption of fresh fruit, vegetables, and low fat dairy, alcohol moderation, and smoking cessation.;Monitor prescription use compliance.    Expected Outcomes  Short Term: Continued assessment and intervention until BP is < 140/26m HG in hypertensive participants. < 130/845mHG in hypertensive participants with diabetes, heart failure or chronic kidney disease.;Long Term: Maintenance of blood pressure at goal levels.    Lipids  Yes    Intervention   Provide education and support for participant on nutrition & aerobic/resistive exercise along with prescribed medications to achieve LDL '70mg'$ , HDL >'40mg'$ .    Expected Outcomes  Short Term: Participant states understanding of desired cholesterol values and is compliant with medications prescribed. Participant is following exercise prescription and nutrition guidelines.;Long Term: Cholesterol controlled with medications as prescribed, with individualized exercise RX and with personalized nutrition plan. Value goals: LDL < '70mg'$ , HDL > 40 mg.       Education:Diabetes - Individual verbal and written instruction to review signs/symptoms of diabetes, desired ranges of glucose level fasting, after meals and with exercise. Acknowledge that pre and post exercise glucose checks will be done for 3 sessions at entry of program.   [facility]  Date  05/26/19  Educator  [facility] Code  1- Verbalizes Understanding      Education: Know Your Numbers and Risk Factors: -Group verbal and written instruction about important numbers in your health.  Discussion of what are risk factors and how they play a role in the disease process.  Review of Cholesterol, Blood Pressure, Diabetes, and BMI and the role they play in your overall health.   Core Components/Risk Factors/Patient Goals Review:    Core Components/Risk Factors/Patient Goals at Discharge (Final Review):    ITP Comments: ITP Comments    Row Name 05/22/19 1022 05/26/19 1524 05/28/19 0818 06/03/19 1049     ITP Comments  Completed Virtual Orientation call.  Has appt.on 4/13 with EP for eval and gym orientation.  [facility]  Completed 6MWT and gym orientation.  Initial ITP created and sent for review to Dr. MaEmily FilbertMedical Director.  First full day of exercise!  Patient was oriented to gym and equipment including functions, settings, policies,  and procedures.  Patient's individual exercise prescription and treatment plan were reviewed.  All starting workloads were established based on the results of the 6 minute walk test done at initial orientation visit.  The plan for exercise progression was also introduced and progression will be customized based on patient's performance and goals.  30 day chart review completed. ITP sent to Dr Zachery Dakins Medical Director, for review,changes as needed and signature. Continue with ITP if no changes requested       Comments:

## 2019-06-04 ENCOUNTER — Encounter: Payer: BC Managed Care – PPO | Admitting: *Deleted

## 2019-06-04 ENCOUNTER — Other Ambulatory Visit: Payer: Self-pay

## 2019-06-04 DIAGNOSIS — Z953 Presence of xenogenic heart valve: Secondary | ICD-10-CM | POA: Diagnosis not present

## 2019-06-04 DIAGNOSIS — Z952 Presence of prosthetic heart valve: Secondary | ICD-10-CM

## 2019-06-04 LAB — GLUCOSE, CAPILLARY
Glucose-Capillary: 163 mg/dL — ABNORMAL HIGH (ref 70–99)
Glucose-Capillary: 98 mg/dL (ref 70–99)

## 2019-06-04 NOTE — Progress Notes (Signed)
Daily Session Note  Patient Details  Name: Erik Allen MRN: [id number] Date of Birth: 05/16/1955 Referring Provider:     Cardiac Rehab from 05/26/2019 in [facility] Cardiac and Pulmonary Rehab  Referring Provider  Arida, Muhammad MD      Encounter Date: 06/04/2019  Check In: Session Check In - 06/04/19 0804      Check-In   Supervising physician immediately available to respond to emergencies  See telemetry face sheet for immediately available ER MD    Location  [facility]-Cardiac & Pulmonary Rehab    Staff Present  Susanne Bice, RN, BSN, CCRP;Amanda Sommer, BA, ACSM CEP, Exercise Physiologist;Melissa Caiola RDN, LDN    Virtual Visit  No    Medication changes reported      No    Fall or balance concerns reported     No    Warm-up and Cool-down  Performed on first and last piece of equipment    Resistance Training Performed  Yes    VAD Patient?  No    PAD/SET Patient?  No      Pain Assessment   Currently in Pain?  No/denies          Social History   Tobacco Use  Smoking Status Never Smoker  Smokeless Tobacco Never Used    Goals Met:  Exercise tolerated well No report of cardiac concerns or symptoms  Goals Unmet:  Not Applicable  Comments: Pt able to follow exercise prescription today without complaint.  Will continue to monitor for progression.    Dr. Mark Miller is Medical Director for [facility] and [facility]. 

## 2019-06-09 ENCOUNTER — Other Ambulatory Visit: Payer: Self-pay

## 2019-06-09 ENCOUNTER — Encounter: Payer: BC Managed Care – PPO | Admitting: *Deleted

## 2019-06-09 DIAGNOSIS — Z953 Presence of xenogenic heart valve: Secondary | ICD-10-CM | POA: Diagnosis not present

## 2019-06-09 DIAGNOSIS — Z952 Presence of prosthetic heart valve: Secondary | ICD-10-CM

## 2019-06-09 NOTE — Progress Notes (Signed)
Daily Session Note  Patient Details  Name: Erik Allen MRN: [id number] Date of Birth: 04/08/1955 Referring Provider:     Cardiac Rehab from 05/26/2019 in [facility] Cardiac and Pulmonary Rehab  Referring Provider  [name] MD      Encounter Date: 06/09/2019  Check In: Session Check In - 06/09/19 0800      Check-In   Supervising physician immediately available to respond to emergencies  See telemetry face sheet for immediately available ER MD    Location  [facility]-Cardiac & Pulmonary Rehab    Staff Present  Nada Maclachlan, BA, ACSM CEP, Exercise Physiologist;Joseph Flavia Shipper;Heath Lark, RN, BSN, CCRP    Virtual Visit  No    Medication changes reported      No    Fall or balance concerns reported     No    Warm-up and Cool-down  Performed on first and last piece of equipment    Resistance Training Performed  Yes    VAD Patient?  No    PAD/SET Patient?  No      Pain Assessment   Currently in Pain?  No/denies          Social History   Tobacco Use  Smoking Status Never Smoker  Smokeless Tobacco Never Used    Goals Met:  Independence with exercise equipment Exercise tolerated well No report of cardiac concerns or symptoms  Goals Unmet:  Not Applicable  Comments: Pt able to follow exercise prescription today without complaint.  Will continue to monitor for progression.    Dr. Emily Filbert is Medical Director for [facility] and [facility].

## 2019-06-11 ENCOUNTER — Encounter: Payer: Self-pay | Admitting: Family Medicine

## 2019-06-11 ENCOUNTER — Ambulatory Visit (INDEPENDENT_AMBULATORY_CARE_PROVIDER_SITE_OTHER): Payer: BC Managed Care – PPO | Admitting: Family Medicine

## 2019-06-11 ENCOUNTER — Other Ambulatory Visit: Payer: Self-pay

## 2019-06-11 VITALS — BP 128/72 | HR 95 | Temp 97.5°F | Ht 68.0 in | Wt 225.4 lb

## 2019-06-11 DIAGNOSIS — I10 Essential (primary) hypertension: Secondary | ICD-10-CM | POA: Diagnosis not present

## 2019-06-11 DIAGNOSIS — E119 Type 2 diabetes mellitus without complications: Secondary | ICD-10-CM | POA: Diagnosis not present

## 2019-06-11 DIAGNOSIS — J4540 Moderate persistent asthma, uncomplicated: Secondary | ICD-10-CM | POA: Diagnosis not present

## 2019-06-11 DIAGNOSIS — Z953 Presence of xenogenic heart valve: Secondary | ICD-10-CM

## 2019-06-11 DIAGNOSIS — I060 Rheumatic aortic stenosis: Secondary | ICD-10-CM

## 2019-06-11 NOTE — Progress Notes (Signed)
This visit was conducted in person.  BP 128/72 (BP Location: Left Arm, Patient Position: Sitting, Cuff Size: Large)   Pulse 95   Temp (!) 97.5 F (36.4 C) (Temporal)   Ht '5\' 8"'$  (1.727 m)   Wt 225 lb 7 oz (102.3 kg)   SpO2 96%   BMI 34.28 kg/m    CC: 5 mo f/u visit  Subjective:    Patient ID: Erik Allen, male    DOB: [birth year], 64 y.o.   MRN: [id number]  HPI: Erik Allen is a 64 y.o. male presenting on 06/11/2019 for Follow-up (Here for 5 mo f/u.)   Has not had COVID vaccines. Awaiting further recovery after below surgery. Leaning towards J&J vaccine.   Last seen 11/2018. At that time echo showed severe rheumatic AS - s/p minimally invasive aortic valve replacement with bioprosthetic valve 04/2019 in cardiac rehab since. Planned echo in 3 months. Heart catheterization prior to procedure showed normal coronary arteries. Thinking of return to work later this summer.   Continues antihypertensive and anti reflux medication with benefit.   Asthma - stable on singulair, advair, PRN albuterol. No recent asthma flare. He uses N95 mask for mowing the lawn and has noticed marked benefit in asthma/allergy control with this.   DM - does not regularly check sugars. Compliant with antihyperglycemic regimen which includes: metformin '500mg'$  bid. Denies low sugars or hypoglycemic symptoms. Denies paresthesias. Last diabetic eye exam DUE - this has been scheduled. Pneumovax: 2014. Prevnar: not due. Glucometer brand: accuchek. DSME: has declined. Lab Results  Component Value Date   HGBA1C 6.2 (H) 04/27/2019   Diabetic Foot Exam - Simple   No data filed     Lab Results  Component Value Date   MICROALBUR 2.6 (H) 07/09/2017        Relevant past medical, surgical, family and social history reviewed and updated as indicated. Interim medical history since our last visit reviewed. Allergies and medications reviewed and updated. Outpatient Medications Prior to Visit  Medication Sig  Dispense Refill  . Accu-Chek FastClix Lancets MISC Use as directed to check blood sugar daily 100 each 1  . ACCU-CHEK GUIDE test strip USE AS INSTRUCTED 100 strip 1  . albuterol (PROVENTIL) (2.5 MG/3ML) 0.083% nebulizer solution USE 1 VIAL VIA NEBULIZER EVERY 6 HOURS AS NEEDED (Patient taking differently: Take 2.5 mg by nebulization every 6 (six) hours as needed for wheezing or shortness of breath. USE 1 VIAL VIA NEBULIZER EVERY 6 HOURS AS NEEDE) 150 mL 3  . albuterol (VENTOLIN HFA) 108 (90 Base) MCG/ACT inhaler INHALE 2 PUFFS INTO THE LUNGS EVERY 6 HOURS AS NEEDED FOR WHEEZING OR SHORTNESS OF BREATH 18 g 4  . aspirin EC 325 MG EC tablet Take 1 tablet (325 mg total) by mouth daily. 30 tablet 0  . Blood Glucose Monitoring Suppl (ACCU-CHEK GUIDE) w/Device KIT 1 each by Does not apply route as directed. 1 kit 0  . Cholecalciferol (VITAMIN D) 50 MCG (2000 UT) CAPS Take 2,000 Units by mouth in the morning and at bedtime.     . cyanocobalamin (V-R VITAMIN B-12) 500 MCG tablet Take 1 tablet (500 mcg total) by mouth daily.    . finasteride (PROSCAR) 5 MG tablet TAKE 1 TABLET BY MOUTH EVERY DAY 90 tablet 1  . Fluticasone-Salmeterol (ADVAIR DISKUS) 250-50 MCG/DOSE AEPB INHALE 1 PUFF BY MOUTH TWICE DAILY. RINSE MOUTH AFTER USE 180 each 1  . metFORMIN (GLUCOPHAGE) 500 MG tablet TAKE 1 TABLET (500 MG TOTAL) BY MOUTH  2 (TWO) TIMES DAILY WITH A MEAL. 180 tablet 0  . metoprolol tartrate (LOPRESSOR) 50 MG tablet Take 1 tablet (50 mg total) by mouth 2 (two) times daily. (Patient taking differently: Take 75 mg by mouth 2 (two) times daily. ) 60 tablet 1  . montelukast (SINGULAIR) 10 MG tablet TAKE 1 TABLET BY MOUTH EVERY NIGHT AT BEDTIME (Patient taking differently: Take 10 mg by mouth at bedtime. ) 90 tablet 2  . oxyCODONE (OXY IR/ROXICODONE) 5 MG immediate release tablet Take 1 tablet (5 mg total) by mouth every 6 (six) hours as needed for severe pain. 30 tablet 0  . pantoprazole (PROTONIX) 40 MG tablet Take 40 mg by  mouth at bedtime.    . simvastatin (ZOCOR) 80 MG tablet TAKE 1 TABLET BY MOUTH EVERYDAY AT BEDTIME (Patient taking differently: Take 80 mg by mouth at bedtime. ) 90 tablet 3  . valsartan-hydrochlorothiazide (DIOVAN-HCT) 160-25 MG tablet Take 1 tablet by mouth daily. 90 tablet 1   No facility-administered medications prior to visit.     Per HPI unless specifically indicated in ROS section below Review of Systems Objective:  BP 128/72 (BP Location: Left Arm, Patient Position: Sitting, Cuff Size: Large)   Pulse 95   Temp (!) 97.5 F (36.4 C) (Temporal)   Ht '5\' 8"'$  (1.727 m)   Wt 225 lb 7 oz (102.3 kg)   SpO2 96%   BMI 34.28 kg/m   Wt Readings from Last 3 Encounters:  06/11/19 225 lb 7 oz (102.3 kg)  05/26/19 226 lb 9.6 oz (102.8 kg)  05/21/19 227 lb 6 oz (103.1 kg)      Physical Exam Vitals and nursing note reviewed.  Constitutional:      Appearance: Normal appearance. He is not ill-appearing.  Cardiovascular:     Rate and Rhythm: Normal rate and regular rhythm.     Pulses: Normal pulses.     Heart sounds: Murmur (2/6 systolic murmur (improved)) present.  Pulmonary:     Effort: Pulmonary effort is normal. No respiratory distress.     Breath sounds: Normal breath sounds. No wheezing, rhonchi or rales.  Musculoskeletal:     Right lower leg: No edema.     Left lower leg: No edema.  Neurological:     Mental Status: He is alert.  Psychiatric:        Mood and Affect: Mood normal.        Behavior: Behavior normal.       Results for orders placed or performed in visit on 06/04/19  Glucose, capillary  Result Value Ref Range   Glucose-Capillary 163 (H) 70 - 99 mg/dL  Glucose, capillary  Result Value Ref Range   Glucose-Capillary 98 70 - 99 mg/dL   Assessment & Plan:  This visit occurred during the SARS-CoV-2 public health emergency.  Safety protocols were in place, including screening questions prior to the visit, additional usage of staff PPE, and extensive cleaning of exam  room while observing appropriate contact time as indicated for disinfecting solutions.   Problem List Items Addressed This Visit    S/P minimally invasive aortic valve replacement with bioprosthetic valve    Appreciate GI and thoracic surgery care.       Essential hypertension    Chronic, stable on current regimen.       Controlled type 2 diabetes mellitus without complication, without long-term current use of insulin (HCC) - Primary    Chronic, stable. Recent A1c well controled during hospitalization. Continue metformin.  Asthma    Chronic, stable. Continue advair and singulair with PRN sparing albuterol.       Aortic stenosis       No orders of the defined types were placed in this encounter.  No orders of the defined types were placed in this encounter.   Follow up plan: Return in about 3 months (around 09/10/2019), or if symptoms worsen or fail to improve, for follow up visit.  Ria Bush, MD

## 2019-06-11 NOTE — Assessment & Plan Note (Signed)
Chronic, stable on current regimen.  

## 2019-06-11 NOTE — Patient Instructions (Signed)
You are doing well today Continue current medications. Return as needed or in 3 months for diabetes follow up visit.

## 2019-06-11 NOTE — Assessment & Plan Note (Signed)
Appreciate GI and thoracic surgery care.

## 2019-06-11 NOTE — Assessment & Plan Note (Signed)
Chronic, stable. Continue advair and singulair with PRN sparing albuterol.

## 2019-06-11 NOTE — Assessment & Plan Note (Signed)
Chronic, stable. Recent A1c well controled during hospitalization. Continue metformin.

## 2019-06-13 DIAGNOSIS — U071 COVID-19: Secondary | ICD-10-CM

## 2019-06-13 HISTORY — DX: COVID-19: U07.1

## 2019-06-16 ENCOUNTER — Encounter: Payer: BC Managed Care – PPO | Attending: Cardiovascular Disease | Admitting: *Deleted

## 2019-06-16 ENCOUNTER — Other Ambulatory Visit: Payer: Self-pay

## 2019-06-16 DIAGNOSIS — J45909 Unspecified asthma, uncomplicated: Secondary | ICD-10-CM | POA: Diagnosis not present

## 2019-06-16 DIAGNOSIS — Z953 Presence of xenogenic heart valve: Secondary | ICD-10-CM | POA: Insufficient documentation

## 2019-06-16 DIAGNOSIS — Z87442 Personal history of urinary calculi: Secondary | ICD-10-CM | POA: Insufficient documentation

## 2019-06-16 DIAGNOSIS — Z952 Presence of prosthetic heart valve: Secondary | ICD-10-CM

## 2019-06-16 DIAGNOSIS — E119 Type 2 diabetes mellitus without complications: Secondary | ICD-10-CM | POA: Insufficient documentation

## 2019-06-16 DIAGNOSIS — N400 Enlarged prostate without lower urinary tract symptoms: Secondary | ICD-10-CM | POA: Diagnosis not present

## 2019-06-16 DIAGNOSIS — Z79899 Other long term (current) drug therapy: Secondary | ICD-10-CM | POA: Insufficient documentation

## 2019-06-16 DIAGNOSIS — I10 Essential (primary) hypertension: Secondary | ICD-10-CM | POA: Insufficient documentation

## 2019-06-16 DIAGNOSIS — Z7951 Long term (current) use of inhaled steroids: Secondary | ICD-10-CM | POA: Insufficient documentation

## 2019-06-16 DIAGNOSIS — K219 Gastro-esophageal reflux disease without esophagitis: Secondary | ICD-10-CM | POA: Insufficient documentation

## 2019-06-16 DIAGNOSIS — Z7984 Long term (current) use of oral hypoglycemic drugs: Secondary | ICD-10-CM | POA: Diagnosis not present

## 2019-06-16 DIAGNOSIS — E785 Hyperlipidemia, unspecified: Secondary | ICD-10-CM | POA: Insufficient documentation

## 2019-06-16 DIAGNOSIS — Z7982 Long term (current) use of aspirin: Secondary | ICD-10-CM | POA: Diagnosis not present

## 2019-06-16 NOTE — Progress Notes (Signed)
Daily Session Note  Patient Details  Name: Erik Allen MRN: [id number] Date of Birth: February 09, 1956 Referring Provider:     Cardiac Rehab from 05/26/2019 in [facility] Cardiac and Pulmonary Rehab  Referring Provider  [name] MD      Encounter Date: 06/16/2019  Check In: Session Check In - 06/16/19 0820      Check-In   Supervising physician immediately available to respond to emergencies  See telemetry face sheet for immediately available ER MD    Location  [facility]-Cardiac & Pulmonary Rehab    Staff Present  Heath Lark, RN, BSN, CCRP;Joseph Hood RCP,RRT,BSRT;Amanda La Alianza, [place], ACSM CEP, Exercise Physiologist    Virtual Visit  No    Medication changes reported      No    Fall or balance concerns reported     No    Warm-up and Cool-down  Performed on first and last piece of equipment    Resistance Training Performed  Yes    VAD Patient?  No    PAD/SET Patient?  No      Pain Assessment   Currently in Pain?  No/denies          Social History   Tobacco Use  Smoking Status Never Smoker  Smokeless Tobacco Never Used    Goals Met:  Independence with exercise equipment Exercise tolerated well No report of cardiac concerns or symptoms  Goals Unmet:  Not Applicable  Comments: Pt able to follow exercise prescription today without complaint.  Will continue to monitor for progression.    Dr. Emily Filbert is Medical Director for [facility] and [facility].

## 2019-06-18 ENCOUNTER — Encounter: Payer: BC Managed Care – PPO | Admitting: *Deleted

## 2019-06-18 ENCOUNTER — Other Ambulatory Visit: Payer: Self-pay

## 2019-06-18 DIAGNOSIS — Z953 Presence of xenogenic heart valve: Secondary | ICD-10-CM | POA: Diagnosis not present

## 2019-06-18 DIAGNOSIS — Z952 Presence of prosthetic heart valve: Secondary | ICD-10-CM

## 2019-06-18 NOTE — Progress Notes (Signed)
Daily Session Note  Patient Details  Name: Erik Allen MRN: [id number] Date of Birth: 1955-03-27 Referring Provider:     Cardiac Rehab from 05/26/2019 in [facility] Cardiac and Pulmonary Rehab  Referring Provider  [name] MD      Encounter Date: 06/18/2019  Check In: Session Check In - 06/18/19 0820      Check-In   Supervising physician immediately available to respond to emergencies  See telemetry face sheet for immediately available ER MD    Location  [facility]-Cardiac & Pulmonary Rehab    Staff Present  Heath Lark, RN, BSN, CCRP;Melissa [place]oville RDN, Rowe Pavy, BA, ACSM CEP, Exercise Physiologist    Virtual Visit  No    Medication changes reported      No    Fall or balance concerns reported     No    Warm-up and Cool-down  Performed on first and last piece of equipment    Resistance Training Performed  Yes    VAD Patient?  No    PAD/SET Patient?  No      Pain Assessment   Currently in Pain?  No/denies          Social History   Tobacco Use  Smoking Status Never Smoker  Smokeless Tobacco Never Used    Goals Met:  Independence with exercise equipment Exercise tolerated well No report of cardiac concerns or symptoms  Goals Unmet:  Not Applicable  Comments: Pt able to follow exercise prescription today without complaint.  Will continue to monitor for progression.    Dr. Emily Filbert is Medical Director for [facility] and [facility].

## 2019-06-21 ENCOUNTER — Other Ambulatory Visit: Payer: Self-pay

## 2019-06-21 ENCOUNTER — Emergency Department: Payer: BC Managed Care – PPO

## 2019-06-21 DIAGNOSIS — Z8249 Family history of ischemic heart disease and other diseases of the circulatory system: Secondary | ICD-10-CM

## 2019-06-21 DIAGNOSIS — Z91048 Other nonmedicinal substance allergy status: Secondary | ICD-10-CM

## 2019-06-21 DIAGNOSIS — Z888 Allergy status to other drugs, medicaments and biological substances status: Secondary | ICD-10-CM

## 2019-06-21 DIAGNOSIS — Z7982 Long term (current) use of aspirin: Secondary | ICD-10-CM

## 2019-06-21 DIAGNOSIS — Z79891 Long term (current) use of opiate analgesic: Secondary | ICD-10-CM

## 2019-06-21 DIAGNOSIS — K219 Gastro-esophageal reflux disease without esophagitis: Secondary | ICD-10-CM | POA: Diagnosis present

## 2019-06-21 DIAGNOSIS — E119 Type 2 diabetes mellitus without complications: Secondary | ICD-10-CM | POA: Diagnosis present

## 2019-06-21 DIAGNOSIS — Z87442 Personal history of urinary calculi: Secondary | ICD-10-CM

## 2019-06-21 DIAGNOSIS — I10 Essential (primary) hypertension: Secondary | ICD-10-CM | POA: Diagnosis present

## 2019-06-21 DIAGNOSIS — R0602 Shortness of breath: Secondary | ICD-10-CM | POA: Diagnosis not present

## 2019-06-21 DIAGNOSIS — Z885 Allergy status to narcotic agent status: Secondary | ICD-10-CM

## 2019-06-21 DIAGNOSIS — E785 Hyperlipidemia, unspecified: Secondary | ICD-10-CM | POA: Diagnosis present

## 2019-06-21 DIAGNOSIS — E876 Hypokalemia: Secondary | ICD-10-CM | POA: Diagnosis present

## 2019-06-21 DIAGNOSIS — U071 COVID-19: Secondary | ICD-10-CM | POA: Diagnosis not present

## 2019-06-21 DIAGNOSIS — I350 Nonrheumatic aortic (valve) stenosis: Secondary | ICD-10-CM | POA: Diagnosis present

## 2019-06-21 DIAGNOSIS — N400 Enlarged prostate without lower urinary tract symptoms: Secondary | ICD-10-CM | POA: Diagnosis present

## 2019-06-21 DIAGNOSIS — J9601 Acute respiratory failure with hypoxia: Secondary | ICD-10-CM | POA: Diagnosis present

## 2019-06-21 DIAGNOSIS — Z953 Presence of xenogenic heart valve: Secondary | ICD-10-CM

## 2019-06-21 DIAGNOSIS — J45909 Unspecified asthma, uncomplicated: Secondary | ICD-10-CM | POA: Diagnosis present

## 2019-06-21 DIAGNOSIS — Z7984 Long term (current) use of oral hypoglycemic drugs: Secondary | ICD-10-CM

## 2019-06-21 DIAGNOSIS — Z79899 Other long term (current) drug therapy: Secondary | ICD-10-CM

## 2019-06-21 LAB — CBC WITH DIFFERENTIAL/PLATELET
Abs Immature Granulocytes: 0.03 10*3/uL (ref 0.00–0.07)
Basophils Absolute: 0 10*3/uL (ref 0.0–0.1)
Basophils Relative: 0 %
Eosinophils Absolute: 0 10*3/uL (ref 0.0–0.5)
Eosinophils Relative: 0 %
HCT: 38.7 % — ABNORMAL LOW (ref 39.0–52.0)
Hemoglobin: 12.6 g/dL — ABNORMAL LOW (ref 13.0–17.0)
Immature Granulocytes: 0 %
Lymphocytes Relative: 8 %
Lymphs Abs: 0.6 10*3/uL — ABNORMAL LOW (ref 0.7–4.0)
MCH: 29.9 pg (ref 26.0–34.0)
MCHC: 32.6 g/dL (ref 30.0–36.0)
MCV: 91.9 fL (ref 80.0–100.0)
Monocytes Absolute: 0.4 10*3/uL (ref 0.1–1.0)
Monocytes Relative: 5 %
Neutro Abs: 7.1 10*3/uL (ref 1.7–7.7)
Neutrophils Relative %: 87 %
Platelets: 176 10*3/uL (ref 150–400)
RBC: 4.21 MIL/uL — ABNORMAL LOW (ref 4.22–5.81)
RDW: 13.2 % (ref 11.5–15.5)
WBC: 8.1 10*3/uL (ref 4.0–10.5)
nRBC: 0 % (ref 0.0–0.2)

## 2019-06-21 LAB — TROPONIN I (HIGH SENSITIVITY): Troponin I (High Sensitivity): 7 ng/L (ref <18)

## 2019-06-21 LAB — BASIC METABOLIC PANEL
Anion gap: 12 (ref 5–15)
BUN: 16 mg/dL (ref 8–23)
CO2: 25 mmol/L (ref 22–32)
Calcium: 8.7 mg/dL — ABNORMAL LOW (ref 8.9–10.3)
Chloride: 98 mmol/L (ref 98–111)
Creatinine, Ser: 1.04 mg/dL (ref 0.61–1.24)
GFR calc Af Amer: >60 mL/min (ref >60)
GFR calc non Af Amer: >60 mL/min (ref >60)
Glucose, Bld: 153 mg/dL — ABNORMAL HIGH (ref 70–99)
Potassium: 2.9 mmol/L — ABNORMAL LOW (ref 3.5–5.1)
Sodium: 135 mmol/L (ref 135–145)

## 2019-06-21 NOTE — ED Triage Notes (Signed)
Patient reports feeling short of breath starting today.  Reports history of asthma.

## 2019-06-22 ENCOUNTER — Encounter: Payer: Self-pay | Admitting: *Deleted

## 2019-06-22 ENCOUNTER — Ambulatory Visit: Payer: BC Managed Care – PPO | Admitting: Thoracic Surgery (Cardiothoracic Vascular Surgery)

## 2019-06-22 ENCOUNTER — Encounter: Payer: Self-pay | Admitting: Internal Medicine

## 2019-06-22 ENCOUNTER — Inpatient Hospital Stay
Admission: EM | Admit: 2019-06-22 | Discharge: 2019-06-26 | DRG: 177 | Disposition: A | Discharge status: Home / Self Care | Payer: BC Managed Care – PPO | Attending: Internal Medicine | Admitting: Internal Medicine

## 2019-06-22 DIAGNOSIS — E785 Hyperlipidemia, unspecified: Secondary | ICD-10-CM | POA: Diagnosis present

## 2019-06-22 DIAGNOSIS — E1169 Type 2 diabetes mellitus with other specified complication: Secondary | ICD-10-CM | POA: Diagnosis not present

## 2019-06-22 DIAGNOSIS — Z7982 Long term (current) use of aspirin: Secondary | ICD-10-CM | POA: Diagnosis not present

## 2019-06-22 DIAGNOSIS — E119 Type 2 diabetes mellitus without complications: Secondary | ICD-10-CM | POA: Diagnosis present

## 2019-06-22 DIAGNOSIS — J9601 Acute respiratory failure with hypoxia: Secondary | ICD-10-CM | POA: Diagnosis present

## 2019-06-22 DIAGNOSIS — J4520 Mild intermittent asthma, uncomplicated: Secondary | ICD-10-CM

## 2019-06-22 DIAGNOSIS — K219 Gastro-esophageal reflux disease without esophagitis: Secondary | ICD-10-CM | POA: Diagnosis present

## 2019-06-22 DIAGNOSIS — U071 COVID-19: Secondary | ICD-10-CM | POA: Diagnosis present

## 2019-06-22 DIAGNOSIS — J45909 Unspecified asthma, uncomplicated: Secondary | ICD-10-CM | POA: Diagnosis present

## 2019-06-22 DIAGNOSIS — N400 Enlarged prostate without lower urinary tract symptoms: Secondary | ICD-10-CM | POA: Diagnosis present

## 2019-06-22 DIAGNOSIS — Z888 Allergy status to other drugs, medicaments and biological substances status: Secondary | ICD-10-CM | POA: Diagnosis not present

## 2019-06-22 DIAGNOSIS — R0902 Hypoxemia: Secondary | ICD-10-CM | POA: Diagnosis not present

## 2019-06-22 DIAGNOSIS — Z79899 Other long term (current) drug therapy: Secondary | ICD-10-CM | POA: Diagnosis not present

## 2019-06-22 DIAGNOSIS — E876 Hypokalemia: Secondary | ICD-10-CM | POA: Diagnosis present

## 2019-06-22 DIAGNOSIS — Z79891 Long term (current) use of opiate analgesic: Secondary | ICD-10-CM | POA: Diagnosis not present

## 2019-06-22 DIAGNOSIS — Z7984 Long term (current) use of oral hypoglycemic drugs: Secondary | ICD-10-CM | POA: Diagnosis not present

## 2019-06-22 DIAGNOSIS — I350 Nonrheumatic aortic (valve) stenosis: Secondary | ICD-10-CM | POA: Diagnosis present

## 2019-06-22 DIAGNOSIS — I10 Essential (primary) hypertension: Secondary | ICD-10-CM | POA: Diagnosis present

## 2019-06-22 DIAGNOSIS — Z953 Presence of xenogenic heart valve: Secondary | ICD-10-CM | POA: Diagnosis not present

## 2019-06-22 DIAGNOSIS — R0602 Shortness of breath: Secondary | ICD-10-CM

## 2019-06-22 DIAGNOSIS — Z91048 Other nonmedicinal substance allergy status: Secondary | ICD-10-CM | POA: Diagnosis not present

## 2019-06-22 DIAGNOSIS — Z87442 Personal history of urinary calculi: Secondary | ICD-10-CM | POA: Diagnosis not present

## 2019-06-22 DIAGNOSIS — Z952 Presence of prosthetic heart valve: Secondary | ICD-10-CM

## 2019-06-22 DIAGNOSIS — Z885 Allergy status to narcotic agent status: Secondary | ICD-10-CM | POA: Diagnosis not present

## 2019-06-22 DIAGNOSIS — Z8249 Family history of ischemic heart disease and other diseases of the circulatory system: Secondary | ICD-10-CM | POA: Diagnosis not present

## 2019-06-22 LAB — FERRITIN: Ferritin: 153 ng/mL (ref 24–336)

## 2019-06-22 LAB — HEPATIC FUNCTION PANEL
ALT: 50 U/L — ABNORMAL HIGH (ref 0–44)
AST: 61 U/L — ABNORMAL HIGH (ref 15–41)
Albumin: 3.7 g/dL (ref 3.5–5.0)
Alkaline Phosphatase: 48 U/L (ref 38–126)
Bilirubin, Direct: 0.2 mg/dL (ref 0.0–0.2)
Indirect Bilirubin: 0.4 mg/dL (ref 0.3–0.9)
Total Bilirubin: 0.6 mg/dL (ref 0.3–1.2)
Total Protein: 7.6 g/dL (ref 6.5–8.1)

## 2019-06-22 LAB — GLUCOSE, CAPILLARY
Glucose-Capillary: 137 mg/dL — ABNORMAL HIGH (ref 70–99)
Glucose-Capillary: 215 mg/dL — ABNORMAL HIGH (ref 70–99)
Glucose-Capillary: 120 mg/dL — ABNORMAL HIGH (ref 70–99)
Glucose-Capillary: 220 mg/dL — ABNORMAL HIGH (ref 70–99)

## 2019-06-22 LAB — FIBRIN DERIVATIVES D-DIMER (ARMC ONLY): Fibrin derivatives D-dimer (ARMC): 1834.02 ng/mL (FEU) — ABNORMAL HIGH (ref 0.00–499.00)

## 2019-06-22 LAB — C-REACTIVE PROTEIN: CRP: 11.9 mg/dL — ABNORMAL HIGH (ref <1.0)

## 2019-06-22 LAB — POC SARS CORONAVIRUS 2 AG: SARS Coronavirus 2 Ag: POSITIVE — AB

## 2019-06-22 LAB — FIBRINOGEN: Fibrinogen: 726 mg/dL — ABNORMAL HIGH (ref 210–475)

## 2019-06-22 LAB — LACTATE DEHYDROGENASE: LDH: 274 U/L — ABNORMAL HIGH (ref 98–192)

## 2019-06-22 LAB — BRAIN NATRIURETIC PEPTIDE: B Natriuretic Peptide: 59 pg/mL (ref 0.0–100.0)

## 2019-06-22 LAB — TROPONIN I (HIGH SENSITIVITY): Troponin I (High Sensitivity): 8 ng/L (ref <18)

## 2019-06-22 LAB — PROCALCITONIN: Procalcitonin: 19.11 ng/mL

## 2019-06-22 LAB — TRIGLYCERIDES: Triglycerides: 81 mg/dL (ref <150)

## 2019-06-22 MED ORDER — MAGNESIUM SULFATE IN D5W 1-5 GM/100ML-% IV SOLN
1.0000 g | Freq: Once | INTRAVENOUS | Status: AC
  Administered 2019-06-22: 09:00:00 1 g via INTRAVENOUS
  Filled 2019-06-22: qty 100

## 2019-06-22 MED ORDER — ZINC SULFATE 220 (50 ZN) MG PO CAPS
220.0000 mg | ORAL_CAPSULE | Freq: Every day | ORAL | Status: DC
  Administered 2019-06-22 – 2019-06-26 (×5): 220 mg via ORAL
  Filled 2019-06-22 (×6): qty 1

## 2019-06-22 MED ORDER — DM-GUAIFENESIN ER 30-600 MG PO TB12: 1.0000 | ORAL_TABLET | Freq: Two times a day (BID) | ORAL | Status: DC | PRN

## 2019-06-22 MED ORDER — ASCORBIC ACID 500 MG PO TABS
500.0000 mg | ORAL_TABLET | Freq: Every day | ORAL | Status: DC
  Administered 2019-06-22 – 2019-06-26 (×5): 500 mg via ORAL
  Filled 2019-06-22 (×6): qty 1

## 2019-06-22 MED ORDER — LOPERAMIDE HCL 2 MG PO CAPS: 2.0000 mg | ORAL_CAPSULE | Freq: Every day | ORAL | Status: DC | PRN

## 2019-06-22 MED ORDER — CYANOCOBALAMIN 500 MCG PO TABS
500.0000 ug | ORAL_TABLET | Freq: Every day | ORAL | Status: DC
  Administered 2019-06-22 – 2019-06-26 (×5): 500 ug via ORAL
  Filled 2019-06-22 (×5): qty 1

## 2019-06-22 MED ORDER — ACETAMINOPHEN 325 MG PO TABS: 650.0000 mg | ORAL_TABLET | Freq: Four times a day (QID) | ORAL | Status: DC | PRN

## 2019-06-22 MED ORDER — ENOXAPARIN SODIUM 40 MG/0.4ML ~~LOC~~ SOLN
40.0000 mg | SUBCUTANEOUS | Status: DC
  Administered 2019-06-22 – 2019-06-26 (×5): 40 mg via SUBCUTANEOUS
  Filled 2019-06-22 (×5): qty 0.4

## 2019-06-22 MED ORDER — INSULIN ASPART 100 UNIT/ML ~~LOC~~ SOLN
0.0000 [IU] | Freq: Three times a day (TID) | SUBCUTANEOUS | Status: DC
  Administered 2019-06-22: 13:00:00 3 [IU] via SUBCUTANEOUS
  Administered 2019-06-22 – 2019-06-23 (×2): 1 [IU] via SUBCUTANEOUS
  Administered 2019-06-23: 12:00:00 2 [IU] via SUBCUTANEOUS
  Administered 2019-06-23 – 2019-06-24 (×2): 1 [IU] via SUBCUTANEOUS
  Administered 2019-06-24: 3 [IU] via SUBCUTANEOUS
  Administered 2019-06-24: 1 [IU] via SUBCUTANEOUS
  Administered 2019-06-25: 3 [IU] via SUBCUTANEOUS
  Administered 2019-06-25: 1 [IU] via SUBCUTANEOUS
  Administered 2019-06-25: 09:00:00 2 [IU] via SUBCUTANEOUS
  Administered 2019-06-26: 10:00:00 1 [IU] via SUBCUTANEOUS
  Administered 2019-06-26: 12:00:00 3 [IU] via SUBCUTANEOUS
  Filled 2019-06-22 (×12): qty 1

## 2019-06-22 MED ORDER — METOPROLOL SUCCINATE ER 50 MG PO TB24: 50.0000 mg | ORAL_TABLET | Freq: Every day | ORAL | Status: DC

## 2019-06-22 MED ORDER — METOPROLOL TARTRATE 50 MG PO TABS
75.0000 mg | ORAL_TABLET | Freq: Two times a day (BID) | ORAL | Status: DC
  Administered 2019-06-22 – 2019-06-24 (×5): 75 mg via ORAL
  Filled 2019-06-22 (×6): qty 1

## 2019-06-22 MED ORDER — ALBUTEROL SULFATE HFA 108 (90 BASE) MCG/ACT IN AERS
2.0000 | INHALATION_SPRAY | RESPIRATORY_TRACT | Status: DC | PRN
  Filled 2019-06-22: qty 6.7

## 2019-06-22 MED ORDER — HYDROCHLOROTHIAZIDE 25 MG PO TABS
25.0000 mg | ORAL_TABLET | Freq: Every day | ORAL | Status: DC
  Administered 2019-06-22 – 2019-06-26 (×5): 25 mg via ORAL
  Filled 2019-06-22 (×5): qty 1

## 2019-06-22 MED ORDER — VALSARTAN-HYDROCHLOROTHIAZIDE 160-25 MG PO TABS: 1.0000 | ORAL_TABLET | Freq: Every day | ORAL | Status: DC

## 2019-06-22 MED ORDER — SODIUM CHLORIDE 0.9 % IV SOLN
200.0000 mg | Freq: Once | INTRAVENOUS | Status: AC
  Administered 2019-06-22: 200 mg via INTRAVENOUS
  Filled 2019-06-22: qty 40

## 2019-06-22 MED ORDER — ATORVASTATIN CALCIUM 20 MG PO TABS
40.0000 mg | ORAL_TABLET | Freq: Every day | ORAL | Status: DC
  Administered 2019-06-22 – 2019-06-26 (×5): 40 mg via ORAL
  Filled 2019-06-22 (×5): qty 2

## 2019-06-22 MED ORDER — IPRATROPIUM BROMIDE HFA 17 MCG/ACT IN AERS
2.0000 | INHALATION_SPRAY | RESPIRATORY_TRACT | Status: DC
  Administered 2019-06-22 – 2019-06-26 (×26): 2 via RESPIRATORY_TRACT
  Filled 2019-06-22 (×2): qty 12.9

## 2019-06-22 MED ORDER — SODIUM CHLORIDE 0.9 % IV SOLN
100.0000 mg | Freq: Every day | INTRAVENOUS | Status: AC
  Administered 2019-06-23 – 2019-06-26 (×4): 100 mg via INTRAVENOUS
  Filled 2019-06-22: qty 20
  Filled 2019-06-22 (×3): qty 100
  Filled 2019-06-22: qty 20

## 2019-06-22 MED ORDER — INSULIN ASPART 100 UNIT/ML ~~LOC~~ SOLN
0.0000 [IU] | Freq: Every day | SUBCUTANEOUS | Status: DC
  Administered 2019-06-22 – 2019-06-24 (×2): 2 [IU] via SUBCUTANEOUS
  Administered 2019-06-25: 3 [IU] via SUBCUTANEOUS
  Filled 2019-06-22 (×4): qty 1

## 2019-06-22 MED ORDER — CHOLECALCIFEROL 10 MCG (400 UNIT) PO TABS
400.0000 [IU] | ORAL_TABLET | Freq: Every day | ORAL | Status: DC
  Administered 2019-06-22 – 2019-06-26 (×5): 400 [IU] via ORAL
  Filled 2019-06-22 (×6): qty 1

## 2019-06-22 MED ORDER — IRBESARTAN 150 MG PO TABS
150.0000 mg | ORAL_TABLET | Freq: Every day | ORAL | Status: DC
  Administered 2019-06-22 – 2019-06-26 (×5): 150 mg via ORAL
  Filled 2019-06-22 (×5): qty 1

## 2019-06-22 MED ORDER — POTASSIUM CHLORIDE CRYS ER 20 MEQ PO TBCR
40.0000 meq | EXTENDED_RELEASE_TABLET | ORAL | Status: AC
  Administered 2019-06-22 (×2): 40 meq via ORAL
  Filled 2019-06-22 (×2): qty 2

## 2019-06-22 MED ORDER — DEXAMETHASONE SODIUM PHOSPHATE 10 MG/ML IJ SOLN
8.0000 mg | Freq: Once | INTRAMUSCULAR | Status: AC
  Administered 2019-06-22: 8 mg via INTRAVENOUS
  Filled 2019-06-22: qty 1

## 2019-06-22 MED ORDER — SODIUM CHLORIDE 0.9 % IV BOLUS
500.0000 mL | Freq: Once | INTRAVENOUS | Status: AC
  Administered 2019-06-22: 09:00:00 500 mL via INTRAVENOUS

## 2019-06-22 MED ORDER — FINASTERIDE 5 MG PO TABS
5.0000 mg | ORAL_TABLET | Freq: Every day | ORAL | Status: DC
  Administered 2019-06-22 – 2019-06-26 (×5): 5 mg via ORAL
  Filled 2019-06-22 (×5): qty 1

## 2019-06-22 MED ORDER — MONTELUKAST SODIUM 10 MG PO TABS
10.0000 mg | ORAL_TABLET | Freq: Every day | ORAL | Status: DC
  Administered 2019-06-22 – 2019-06-25 (×4): 10 mg via ORAL
  Filled 2019-06-22 (×4): qty 1

## 2019-06-22 MED ORDER — PANTOPRAZOLE SODIUM 40 MG PO TBEC
40.0000 mg | DELAYED_RELEASE_TABLET | Freq: Every day | ORAL | Status: DC
  Administered 2019-06-22 – 2019-06-25 (×4): 40 mg via ORAL
  Filled 2019-06-22 (×4): qty 1

## 2019-06-22 MED ORDER — METHYLPREDNISOLONE SODIUM SUCC 125 MG IJ SOLR
60.0000 mg | Freq: Two times a day (BID) | INTRAMUSCULAR | Status: DC
  Administered 2019-06-22 – 2019-06-26 (×8): 60 mg via INTRAVENOUS
  Filled 2019-06-22 (×8): qty 2

## 2019-06-22 MED ORDER — HYDRALAZINE HCL 20 MG/ML IJ SOLN: 5.0000 mg | INTRAMUSCULAR | Status: DC | PRN

## 2019-06-22 MED ORDER — ONDANSETRON HCL 4 MG/2ML IJ SOLN: 4.0000 mg | Freq: Three times a day (TID) | INTRAMUSCULAR | Status: DC | PRN

## 2019-06-22 MED ORDER — ACETAMINOPHEN 500 MG PO TABS
1000.0000 mg | ORAL_TABLET | Freq: Once | ORAL | Status: AC
  Administered 2019-06-22: 1000 mg via ORAL
  Filled 2019-06-22: qty 2

## 2019-06-22 MED ORDER — ASPIRIN EC 325 MG PO TBEC
325.0000 mg | DELAYED_RELEASE_TABLET | Freq: Every day | ORAL | Status: DC
  Administered 2019-06-22 – 2019-06-26 (×5): 325 mg via ORAL
  Filled 2019-06-22 (×5): qty 1

## 2019-06-22 NOTE — ED Notes (Signed)
Report to ashley, rn

## 2019-06-22 NOTE — ED Provider Notes (Signed)
[facility] Emergency Department Provider Note  ____________________________________________   First MD Initiated Contact with Patient 06/22/19 0507     (approximate)  I have reviewed the triage vital signs and the nursing notes.  History  Chief Complaint Shortness of Breath    HPI Erik Allen is a 64 y.o. male with history of asthma, status post aortic valve replacement, who presents to the emergency department with concern for COVID.  Patient states he was recently exposed to a friend with [place].  On Friday, developed fevers, cough, shortness of breath, loose stool, fatigue.  Symptoms have been constant since onset.  Patient states he attempted to get COVID tested but he has not gotten the results.  This evening his shortness of breath and cough seemed to worsen and therefore presented to the ER for further evaluation.  No alleviating or aggravating components.   Past Medical Hx Past Medical History:  Diagnosis Date  . Aortic stenosis 06/21/2014   Echo 01/2018: Mild LVH, normal sys fxn EF 55-60%, G1DD, moderate AS with mildly dilated LA. rec yearly echocardiogram  . Asthma 09/13/99  . BENIGN PROSTATIC HYPERTROPHY 05/14/2003  . CAP (community acquired pneumonia) 01/31/2015  . Diabetes mellitus without complication ([place])   . Disorder of vocal cord 2012   h/o thrush on vocal cords per patient  . GERD (gastroesophageal reflux disease) 10/13/01   ENT eval for GERD and thrush 2011  . History of kidney stones   . HTN (hypertension)   . Hyperlipemia 10/13/97  . S/P minimally invasive aortic valve replacement with bioprosthetic valve 04/29/2019   [facility]wards Inspiris Resilia stented bovine pericardial tissue valve (size 23 mm)  . Seasonal allergic rhinitis    worse in spring  . Systolic murmur   . Transaminitis 2014   presumed fatty liver without [place], normal iron and viral hep panel    Problem List Patient Active Problem List   Diagnosis Date Noted  . S/P  minimally invasive aortic valve replacement with bioprosthetic valve 04/29/2019  . Chest discomfort 01/13/2018  . Metabolic syndrome 81/19/1478  . Osteopenia 03/09/2017  . Skin rash 03/07/2017  . Ganglion cyst of finger of left hand 07/06/2016  . Fatigue 07/06/2016  . Tinea pedis 10/31/2015  . Actinic keratosis 06/21/2014  . Aortic stenosis   . Lumbar back pain with radiculopathy affecting right lower extremity 12/21/2013  . Hoarseness 10/17/2012  . Fatty liver disease, nonalcoholic 29/56/2130  . Obesity, Class I, BMI 30.0-34.9 (see actual BMI) 07/16/2011  . Healthcare maintenance 05/21/2011  . Carpal tunnel syndrome of right wrist 10/04/2010  . Vitamin D deficiency 11/14/2009  . ANXIETY DEPRESSION 10/20/2008  . Rosacea 11/20/2006  . Allergic rhinitis 05/23/2006  . Controlled type 2 diabetes mellitus without complication, without long-term current use of insulin ([place]) 05/13/2005  . Essential hypertension 09/13/2003  . Benign prostatic hyperplasia 05/14/2003  . GERD 10/13/2001  . Asthma 09/13/1999  . Hyperlipidemia associated with type 2 diabetes mellitus ([place]) 10/13/1997    Past Surgical Hx Past Surgical History:  Procedure Laterality Date  . A1 pulley release  10/2010   stensosing tenosynovitis, R milddle, ring, little fingers  . AORTIC VALVE REPLACEMENT N/A 04/29/2019   Procedure: MINIMALLY INVASIVE AORTIC VALVE REPLACEMENT (AVR) USING INSPIRIS VALVE SIZE 23MM;  Surgeon: Rexene Alberts, MD;  Location: [place];  Service: Open Heart Surgery;  Laterality: N/A;  . COLONOSCOPY  2010   WNL, rpt due 10 yrs  . CYSTECTOMY     from back  .  CYSTOSCOPY  08/02   Stone retrieval  . EAR CYST EXCISION Left 08/02/2016   Procedure: EXCISION MUCOID CYST LEFT MIDDLE FINGER WITH DISTAL INTERPHALANGEAL JOINT [place];  Surgeon: Daryll Brod, MD;  Location: [place];  Service: Orthopedics;  Laterality: Left;  . FOOT CAPSULE RELEASE W/ PERCUTANEOUS HEEL CORD LENGTHENING, TIBIAL  TENDON TRANSFER  04/09   bilateral, Dr. Milinda Pointer  . INGUINAL HERNIA REPAIR  06/28/09   left, with mesh, Dr. Bary Castilla  . RIGHT/LEFT HEART CATH AND CORONARY ANGIOGRAPHY Bilateral 04/06/2019   Procedure: RIGHT/LEFT HEART CATH AND CORONARY ANGIOGRAPHY;  Surgeon: Wellington Hampshire, MD;  Location: [place] [facility];  Service: Cardiovascular;  Laterality: Bilateral;  . ROTATION FLAP Left 08/02/2016   Procedure: ROTATION DORSAL FLAP;  Surgeon: Daryll Brod, MD;  Location: [place];  Service: Orthopedics;  Laterality: Left;  . TEE WITHOUT CARDIOVERSION N/A 04/29/2019   Procedure: TRANSESOPHAGEAL ECHOCARDIOGRAM (TEE);  Surgeon: Rexene Alberts, MD;  Location: [place];  Service: Open Heart Surgery;  Laterality: N/A;  . US ECHOCARDIOGRAPHY  09/01   TR, MR    Medications Prior to Admission medications   Medication Sig Start Date End Date Taking? Authorizing Provider  Accu-Chek FastClix Lancets MISC Use as directed to check blood sugar daily 12/09/18   Ria Bush, MD  ACCU-CHEK GUIDE test strip USE AS INSTRUCTED 04/03/19   Ria Bush, MD  albuterol (PROVENTIL) (2.5 MG/3ML) 0.083% nebulizer solution USE 1 VIAL VIA NEBULIZER EVERY 6 HOURS AS NEEDED Patient taking differently: Take 2.5 mg by nebulization every 6 (six) hours as needed for wheezing or shortness of breath. USE 1 VIAL VIA NEBULIZER EVERY 6 HOURS AS NEEDE 04/17/18   Ria Bush, MD  albuterol (VENTOLIN HFA) 108 (90 Base) MCG/ACT inhaler INHALE 2 PUFFS INTO THE LUNGS EVERY 6 HOURS AS NEEDED FOR WHEEZING OR SHORTNESS OF BREATH 04/28/19   Ria Bush, MD  aspirin EC 325 MG EC tablet Take 1 tablet (325 mg total) by mouth daily. 05/06/19   Elgie Collard, PA-C  Blood Glucose Monitoring Suppl (ACCU-CHEK GUIDE) w/Device KIT 1 each by Does not apply route as directed. 12/09/18   Ria Bush, MD  Cholecalciferol (VITAMIN D) 50 MCG (2000 UT) CAPS Take 2,000 Units by mouth in the morning and at bedtime.     [provider]  cyanocobalamin (V-R VITAMIN B-12) 500 MCG tablet Take 1 tablet (500 mcg total) by mouth daily. 11/06/16   Ria Bush, MD  finasteride (PROSCAR) 5 MG tablet TAKE 1 TABLET BY MOUTH EVERY DAY 05/19/19   Ria Bush, MD  Fluticasone-Salmeterol (ADVAIR DISKUS) 250-50 MCG/DOSE AEPB INHALE 1 PUFF BY MOUTH TWICE DAILY. RINSE MOUTH AFTER USE 04/24/19   Ria Bush, MD  metFORMIN (GLUCOPHAGE) 500 MG tablet TAKE 1 TABLET (500 MG TOTAL) BY MOUTH 2 (TWO) TIMES DAILY WITH A MEAL. 04/21/19   Ria Bush, MD  metoprolol tartrate (LOPRESSOR) 50 MG tablet Take 1 tablet (50 mg total) by mouth 2 (two) times daily. Patient taking differently: Take 75 mg by mouth 2 (two) times daily.  05/05/19   Elgie Collard, PA-C  montelukast (SINGULAIR) 10 MG tablet TAKE 1 TABLET BY MOUTH EVERY NIGHT AT BEDTIME Patient taking differently: Take 10 mg by mouth at bedtime.  10/07/18   Ria Bush, MD  oxyCODONE (OXY IR/ROXICODONE) 5 MG immediate release tablet Take 1 tablet (5 mg total) by mouth every 6 (six) hours as needed for severe pain. 05/05/19   Elgie Collard, PA-C  pantoprazole (PROTONIX)  40 MG tablet Take 40 mg by mouth at bedtime. 03/04/19   [provider]  simvastatin (ZOCOR) 80 MG tablet TAKE 1 TABLET BY MOUTH EVERYDAY AT BEDTIME Patient taking differently: Take 80 mg by mouth at bedtime.  09/24/18   Ria Bush, MD  valsartan-hydrochlorothiazide (DIOVAN-HCT) 160-25 MG tablet Take 1 tablet by mouth daily. 02/24/19   Ria Bush, MD    Allergies Ace inhibitors, Codeine, and Tramadol  Family Hx Family History  Problem Relation Age of Onset  . Hypertension Mother   . Cancer Father 39       Lymphoma, recurrent  . Hypertension Father   . Hypertension Brother     Social Hx Social History   Tobacco Use  . Smoking status: Never Smoker  . Smokeless tobacco: Never Used  Substance Use Topics  . Alcohol use: Yes    Alcohol/week: 1.0 - 2.0 standard drinks     Types: 1 - 2 Standard drinks or equivalent per week    Comment: occassionally  . Drug use: No     Review of Systems  Constitutional: Positive for fever. Eyes: Negative for visual changes. ENT: Negative for sore throat. Cardiovascular: Negative for chest pain. Respiratory: Positive for shortness of breath, cough. Gastrointestinal: Negative for nausea. Negative for vomiting.  Positive for loose stool. Genitourinary: Negative for dysuria. Musculoskeletal: Negative for leg swelling. Skin: Negative for rash. Neurological: Negative for headaches.   Physical Exam  Vital Signs: ED Triage Vitals  Enc Vitals Group     BP 06/21/19 2103 (!) 152/75     Pulse Rate 06/21/19 2103 (!) 138     Resp 06/21/19 2103 20     Temp 06/21/19 2103 98.1 F (36.7 C)     Temp Source 06/21/19 2103 Oral     SpO2 06/21/19 2103 95 %     Weight 06/21/19 2059 220 lb (99.8 kg)     Height 06/21/19 2059 _0  (1.778 m)     Head Circumference --      Peak Flow --      Pain Score --      Pain Loc --      Pain Edu? --      Excl. in [place]? --     Constitutional: Alert and oriented.  Appears fatigued.  Head: Normocephalic. Atraumatic. Eyes: Conjunctivae clear. Sclera anicteric. Pupils equal and symmetric. Nose: No masses or lesions. No congestion or rhinorrhea. Mouth/Throat: Wearing mask.  Neck: No stridor. Trachea midline.  Cardiovascular: Tachycardic, regular rhythm. Extremities well perfused. Respiratory: Normal respiratory effort.  Frequent forceful dry coughing on exam.  Oxygen upper 80s/low 90s on RA, placed on 3 L nasal cannula with improvement. Genitourinary: Deferred. Musculoskeletal: No lower extremity edema. No deformities. Neurologic:  Normal speech and language. No gross focal or lateralizing neurologic deficits are appreciated.  Skin: Feels warm, febrile. Psychiatric: Mood and affect are appropriate for situation.  EKG  Personally reviewed and interpreted by myself.   Date: 06/21/19 Time:  2100 Rate: 139 Rhythm: sinus Axis: normal Intervals: WNL No STEMI    Radiology  Personally reviewed available imaging myself.   CXR - IMPRESSION:  1. Stable exam, no acute process.    Procedures  Procedure(s) performed (including critical care):  .Critical Care Performed by: Lilia Pro., MD Authorized by: Lilia Pro., MD   Critical care provider statement:    Critical care time (minutes):  35   Critical care was time spent personally by me on the following activities:  Discussions with consultants,  evaluation of patient's response to treatment, examination of patient, ordering and performing treatments and interventions, ordering and review of laboratory studies, ordering and review of radiographic studies, pulse oximetry, re-evaluation of patient's condition, obtaining history from patient or surrogate and review of old charts     Initial Impression / Assessment and Plan / MDM / ED Course  64 y.o. male who presents to the ED with SOB, cough, fever, loose stool in setting of recent COVID exposure. Patient hypoxic on exam requiring 3 L [place]. Febrile w/ associated tachycardia.  Presentation consistent with and concerning for COVID with new oxygen requirement.  Will obtain labs, imaging, provide supplemental oxygen, COVID testing.  COVID antigen testing is positive.  With his new oxygen requirement, will plan for admission.  Ordered Decadron and consulted pharmacy for remdesivir dosing. Discussed w/ hospitalist Dr. Sidney Ace for admission. Notably, did discuss with hospitalist the patient's tachycardia - he is febrile, which may be the etiology for his tachycardia - treating this with antipyretics and will reassess.  However, if his tachycardia does not improve with temperature improvement, would consider further evaluation for potential PE in setting of COVID.  Hospitalist aware and is in agreement with this plan.   _______________________________   As part of my medical  decision making I have reviewed available labs, radiology tests, reviewed old records/performed chart review.   Final Clinical Impression(s) / ED Diagnosis  Cough Hypoxia COVID     Note:  This document was prepared using Dragon voice recognition software and may include unintentional dictation errors.   Lilia Pro., MD 06/22/19 [phone]

## 2019-06-22 NOTE — ED Notes (Signed)
Breakfast tray given. °

## 2019-06-22 NOTE — ED Notes (Signed)
Report received from April, RN. Pt is resting comfortably in ED stretcher, ED stretcher locked and lowest position. Pt A&Ox4 and NAD. Denies pain and has no complaints at this time. V/S WNL. Will continue to monitor

## 2019-06-22 NOTE — H&P (Signed)
History and Physical    Erik Allen MHD:[id number] DOB: 1955-05-05 DOA: 06/22/2019  Referring MD/NP/PA:   PCP: Ria Bush, MD   Patient coming from:  The patient is coming from home.  At baseline, pt is independent for most of ADL.        Chief Complaint: SOB  HPI: Erik Allen is a 64 y.o. male with medical history significant of hypertension, hyperlipidemia, diabetes mellitus, asthma, GERD, aortic stenosis, aortic valve replacement (bioprosthetic valve), BPH, who presents with shortness of breath.  Patient states that he has been having shortness of breath, fever, chills, dry cough, fatigue for more than 4 days, which has been progressively worsening.  Patient does not have chest pain.  Patient has nausea and intermittent loose stool bowel movement, but no vomiting or abdominal pain.  No symptoms of UTI.  No unilateral weakness.  ED Course: pt was found to have positive COVID-19 Ag test, WBC 8.1, troponin 7-->8, potassium 2.9, renal function okay, temperature 102, blood pressure 135/80, tachycardia, tachypnea, oxygen saturation 95-98% on 3 L nasal cannula oxygen.  Chest x-ray is negative and stable.  Patient is admitted to [facility] bed as inpatient.  Review of Systems:   General: has fevers, chills, no body weight gain, has poor appetite, has fatigue HEENT: no blurry vision, hearing changes or sore throat Respiratory: has dyspnea, coughing, no wheezing CV: no chest pain, no palpitations GI: has nausea, diarrhea, no constipation, vomiting, abdominal pain, GU: no dysuria, burning on urination, increased urinary frequency, hematuria  Ext: no leg edema Neuro: no unilateral weakness, numbness, or tingling, no vision change or hearing loss Skin: no rash, no skin tear. MSK: No muscle spasm, no deformity, no limitation of range of movement in spin Heme: No easy bruising.  Travel history: No recent long distant travel.  Allergy:  Allergies  Allergen Reactions  . Ace  Inhibitors Cough  . Codeine Other (See Comments)    REACTION: DIZZY / HEADACHE  . Tramadol Nausea And Vomiting    Past Medical History:  Diagnosis Date  . Aortic stenosis 06/21/2014   Echo 01/2018: Mild LVH, normal sys fxn EF 55-60%, G1DD, moderate AS with mildly dilated LA. rec yearly echocardiogram  . Asthma 09/13/99  . BENIGN PROSTATIC HYPERTROPHY 05/14/2003  . CAP (community acquired pneumonia) 01/31/2015  . Diabetes mellitus without complication ([place])   . Disorder of vocal cord 2012   h/o thrush on vocal cords per patient  . GERD (gastroesophageal reflux disease) 10/13/01   ENT eval for GERD and thrush 2011  . History of kidney stones   . HTN (hypertension)   . Hyperlipemia 10/13/97  . S/P minimally invasive aortic valve replacement with bioprosthetic valve 04/29/2019   [facility] Inspiris Resilia stented bovine pericardial tissue valve (size 23 mm)  . Seasonal allergic rhinitis    worse in spring  . Systolic murmur   . Transaminitis 2014   presumed fatty liver without [place], normal iron and viral hep panel    Past Surgical History:  Procedure Laterality Date  . A1 pulley release  10/2010   stensosing tenosynovitis, R milddle, ring, little fingers  . AORTIC VALVE REPLACEMENT N/A 04/29/2019   Procedure: MINIMALLY INVASIVE AORTIC VALVE REPLACEMENT (AVR) USING INSPIRIS VALVE SIZE 23MM;  Surgeon: Rexene Alberts, MD;  Location: [facility];  Service: Open Heart Surgery;  Laterality: N/A;  . COLONOSCOPY  2010   WNL, rpt due 10 yrs  . CYSTECTOMY     from back  . CYSTOSCOPY  08/02  Stone retrieval  . EAR CYST EXCISION Left 08/02/2016   Procedure: EXCISION MUCOID CYST LEFT MIDDLE FINGER WITH DISTAL INTERPHALANGEAL JOINT [place];  Surgeon: Daryll Brod, MD;  Location: [facility];  Service: Orthopedics;  Laterality: Left;  . FOOT CAPSULE RELEASE W/ PERCUTANEOUS HEEL CORD LENGTHENING, TIBIAL TENDON TRANSFER  04/09   bilateral, Dr. Milinda Pointer  . INGUINAL HERNIA REPAIR  06/28/09    left, with mesh, Dr. Bary Castilla  . RIGHT/LEFT HEART CATH AND CORONARY ANGIOGRAPHY Bilateral 04/06/2019   Procedure: RIGHT/LEFT HEART CATH AND CORONARY ANGIOGRAPHY;  Surgeon: Wellington Hampshire, MD;  Location: [facility];  Service: Cardiovascular;  Laterality: Bilateral;  . ROTATION FLAP Left 08/02/2016   Procedure: ROTATION DORSAL FLAP;  Surgeon: Daryll Brod, MD;  Location: [facility];  Service: Orthopedics;  Laterality: Left;  . TEE WITHOUT CARDIOVERSION N/A 04/29/2019   Procedure: TRANSESOPHAGEAL ECHOCARDIOGRAM (TEE);  Surgeon: Rexene Alberts, MD;  Location: [facility];  Service: Open Heart Surgery;  Laterality: N/A;  . US ECHOCARDIOGRAPHY  09/01   TR, MR    Social History:  reports that he has never smoked. He has never used smokeless tobacco. He reports current alcohol use of about 1.0 - 2.0 standard drinks of alcohol per week. He reports that he does not use drugs.  Family History:  Family History  Problem Relation Age of Onset  . Hypertension Mother   . Cancer Father 86       Lymphoma, recurrent  . Hypertension Father   . Hypertension Brother      Prior to Admission medications   Medication Sig Start Date End Date Taking? Authorizing Provider  Accu-Chek FastClix Lancets MISC Use as directed to check blood sugar daily 12/09/18   Ria Bush, MD  ACCU-CHEK GUIDE test strip USE AS INSTRUCTED 04/03/19   Ria Bush, MD  albuterol (PROVENTIL) (2.5 MG/3ML) 0.083% nebulizer solution USE 1 VIAL VIA NEBULIZER EVERY 6 HOURS AS NEEDED Patient taking differently: Take 2.5 mg by nebulization every 6 (six) hours as needed for wheezing or shortness of breath. USE 1 VIAL VIA NEBULIZER EVERY 6 HOURS AS NEEDE 04/17/18   Ria Bush, MD  albuterol (VENTOLIN HFA) 108 (90 Base) MCG/ACT inhaler INHALE 2 PUFFS INTO THE LUNGS EVERY 6 HOURS AS NEEDED FOR WHEEZING OR SHORTNESS OF BREATH 04/28/19   Ria Bush, MD  aspirin EC 325 MG EC tablet Take 1 tablet (325 mg total)  by mouth daily. 05/06/19   Elgie Collard, PA-C  Blood Glucose Monitoring Suppl (ACCU-CHEK GUIDE) w/Device KIT 1 each by Does not apply route as directed. 12/09/18   Ria Bush, MD  Cholecalciferol (VITAMIN D) 50 MCG (2000 UT) CAPS Take 2,000 Units by mouth in the morning and at bedtime.     [provider]  cyanocobalamin (V-R VITAMIN B-12) 500 MCG tablet Take 1 tablet (500 mcg total) by mouth daily. 11/06/16   Ria Bush, MD  finasteride (PROSCAR) 5 MG tablet TAKE 1 TABLET BY MOUTH EVERY DAY 05/19/19   Ria Bush, MD  Fluticasone-Salmeterol (ADVAIR DISKUS) 250-50 MCG/DOSE AEPB INHALE 1 PUFF BY MOUTH TWICE DAILY. RINSE MOUTH AFTER USE 04/24/19   Ria Bush, MD  metFORMIN (GLUCOPHAGE) 500 MG tablet TAKE 1 TABLET (500 MG TOTAL) BY MOUTH 2 (TWO) TIMES DAILY WITH A MEAL. 04/21/19   Ria Bush, MD  metoprolol tartrate (LOPRESSOR) 50 MG tablet Take 1 tablet (50 mg total) by mouth 2 (two) times daily. Patient taking differently: Take 75 mg by mouth 2 (two) times  daily.  05/05/19   Elgie Collard, PA-C  montelukast (SINGULAIR) 10 MG tablet TAKE 1 TABLET BY MOUTH EVERY NIGHT AT BEDTIME Patient taking differently: Take 10 mg by mouth at bedtime.  10/07/18   Ria Bush, MD  oxyCODONE (OXY IR/ROXICODONE) 5 MG immediate release tablet Take 1 tablet (5 mg total) by mouth every 6 (six) hours as needed for severe pain. 05/05/19   Elgie Collard, PA-C  pantoprazole (PROTONIX) 40 MG tablet Take 40 mg by mouth at bedtime. 03/04/19   [provider]  simvastatin (ZOCOR) 80 MG tablet TAKE 1 TABLET BY MOUTH EVERYDAY AT BEDTIME Patient taking differently: Take 80 mg by mouth at bedtime.  09/24/18   Ria Bush, MD  valsartan-hydrochlorothiazide (DIOVAN-HCT) 160-25 MG tablet Take 1 tablet by mouth daily. 02/24/19   Ria Bush, MD    Physical Exam: Vitals:   06/22/19 0615 06/22/19 0630 06/22/19 0645 06/22/19 0834  BP:  135/80    Pulse: (!) 128 (!) 125 (!) 123  (!) 114  Resp:    20  Temp:    98.4 F (36.9 C)  TempSrc:      SpO2: 97% 97% 98% 97%  Weight:      Height:       General: Not in acute distress HEENT:       Eyes: PERRL, EOMI, no scleral icterus.       ENT: No discharge from the ears and nose, no pharynx injection, no tonsillar enlargement.        Neck: No JVD, no bruit, no mass felt. Heme: No neck lymph node enlargement. Cardiac: S1/S2, RRR, No murmurs, No gallops or rubs. Respiratory: No rales, wheezing, rhonchi or rubs. GI: Soft, nondistended, nontender, no rebound pain, no organomegaly, BS present. GU: No hematuria Ext: No pitting leg edema bilaterally. 2+DP/PT pulse bilaterally. Musculoskeletal: No joint deformities, No joint redness or warmth, no limitation of ROM in spin. Skin: No rashes.  Neuro: Alert, oriented X3, cranial nerves II-XII grossly intact, moves all extremities normally.  Psych: Patient is not psychotic, no suicidal or hemocidal ideation.  Labs on Admission: I have personally reviewed following labs and imaging studies  CBC: Recent Labs  Lab 06/21/19 2106  WBC 8.1  NEUTROABS 7.1  HGB 12.6*  HCT 38.7*  MCV 91.9  PLT 419   Basic Metabolic Panel: Recent Labs  Lab 06/21/19 2106  NA 135  K 2.9*  CL 98  CO2 25  GLUCOSE 153*  BUN 16  CREATININE 1.04  CALCIUM 8.7*   GFR: Estimated Creatinine Clearance: 88.3 mL/min (by C-G formula based on SCr of 1.04 mg/dL). Liver Function Tests: No results for input(s): AST, ALT, ALKPHOS, BILITOT, PROT, ALBUMIN in the last 168 hours. No results for input(s): LIPASE, AMYLASE in the last 168 hours. No results for input(s): AMMONIA in the last 168 hours. Coagulation Profile: No results for input(s): INR, PROTIME in the last 168 hours. Cardiac Enzymes: No results for input(s): CKTOTAL, CKMB, CKMBINDEX, TROPONINI in the last 168 hours. BNP (last 3 results) No results for input(s): PROBNP in the last 8760 hours. HbA1C: No results for input(s): HGBA1C in the last  72 hours. CBG: Recent Labs  Lab 06/22/19 0828  GLUCAP 137*   Lipid Profile: No results for input(s): CHOL, HDL, LDLCALC, TRIG, CHOLHDL, LDLDIRECT in the last 72 hours. Thyroid Function Tests: No results for input(s): TSH, T4TOTAL, FREET4, T3FREE, THYROIDAB in the last 72 hours. Anemia Panel: No results for input(s): VITAMINB12, FOLATE, FERRITIN, TIBC, IRON, RETICCTPCT in  the last 72 hours. Urine analysis:    Component Value Date/Time   COLORURINE YELLOW 04/27/2019 0930   APPEARANCEUR CLEAR 04/27/2019 0930   LABSPEC 1.025 04/27/2019 0930   PHURINE 7.0 04/27/2019 0930   GLUCOSEU NEGATIVE 04/27/2019 0930   HGBUR NEGATIVE 04/27/2019 0930   BILIRUBINUR NEGATIVE 04/27/2019 0930   KETONESUR NEGATIVE 04/27/2019 0930   PROTEINUR NEGATIVE 04/27/2019 0930   NITRITE NEGATIVE 04/27/2019 0930   LEUKOCYTESUR NEGATIVE 04/27/2019 0930   Sepsis Labs: _0 (procalcitonin:4,lacticidven:4) )No results found for this or any previous visit (from the past 240 hour(s)).   Radiological Exams on Admission: DG Chest 2 View  Result Date: 06/21/2019 CLINICAL DATA:  Short of breath for 1 day, asthma EXAM: CHEST - 2 VIEW COMPARISON:  05/18/2019 FINDINGS: Frontal and lateral views of the chest demonstrate postsurgical changes from aortic valve replacement, stable. Cardiac silhouette is unremarkable. No airspace disease, effusion, or pneumothorax. Scattered areas of scarring are seen within the lung bases. No acute bony abnormalities. IMPRESSION: 1. Stable exam, no acute process. Electronically Signed   By: Randa Ngo M.D.   On: 06/21/2019 21:22     EKG: Independently reviewed.  Sinus rhythm, tachycardia, QTC 465, LAD, LAE, poor R wave progression  Assessment/Plan Principal Problem:   Acute hypoxemic respiratory failure due to COVID-19 [facility]) Active Problems:   Hyperlipidemia associated with type 2 diabetes mellitus ([place])   Essential hypertension   Asthma   GERD   Benign prostatic hyperplasia    Controlled type 2 diabetes mellitus without complication, without long-term current use of insulin (H[facility])   Acute hypoxemic respiratory failure due to COVID-19 [facility]): CXR is stable without obvious obvious infiltration, but the patient has new oxygen requirement, needs 3 L nasal cannula oxygen now.  -will admit to med-surg bed as inpt -Remdesivir per pharm -Solumedrol 60 mg bid -vitamin C, zinc.  -Bronchodilators -f/u Blood culture -Gentle IV fluid:  -D-dimer, BNP,Trop, LFT, CRP, LDH, Procalcitonin, Ferritin, fibinogen, TG, Hep B SAg, HIV ab -Daily CRP, Ferritin, D-dimer, -Will ask the patient to maintain an awake prone position for 16+ hours a day, if possible, with a minimum of 2-3 hours at a time -Will attempt to maintain euvolemia to a net negative fluid status -IF patient deteriorates, will consult PCCM and ID   Hypokalemia: K=2.9 on admission. - Repleted - Check Mg level - Give 1 g of magnesium sulfate  Hyperlipidemia associated with type 2 diabetes mellitus ([place]): -zocor  HTN:  -Continue home medications: Metoprolol, Diovan-HCTZ -hydralazine prn  Asthma: -Bronchodilators as above -Singulair  GERD -Protonix  Benign prostatic hyperplasia -Proscar  Controlled type 2 diabetes mellitus without complication, without long-term current use of insulin ([place]): Most recent A1c 6.2, well controled. Patient is Metformin taking at home -SSI    DVT ppx: SQ Lovenox Code Status: Full code Family Communication: not done, no family member is at bed side. Disposition Plan:  Anticipate discharge back to previous home environment Consults called:  none Admission status: Med-surg bed inpt     Status is: Inpatient  Remains inpatient appropriate because:Inpatient level of care appropriate due to severity of illness.  Patient has multiple comorbidities, now presents with acute hypoxia respiratory failure due to COVID-19 infection.  Patient has new oxygen requirement.  His  presentation is highly complicated, at high risk of deteriorating.  Patient will need to be treated in hospital for at least 2 days.  Dispo: The patient is from: Home              Anticipated  d/c is to: Home              Anticipated d/c date is: 2 days              Patient currently is not medically stable to d/c.           Date of Service 06/22/2019    Ivor Costa [facility]   If 7PM-7AM, please contact night-coverage [email or web address] 06/22/2019, 8:36 AM

## 2019-06-22 NOTE — Progress Notes (Signed)
Remdesivir - Pharmacy Brief Note   O:  CXR: IMPRESSION: 1. Stable exam, no acute process.  SpO2: 95 - 98% on 3L [place]   A/P:  Remdesivir 200 mg IVPB once followed by 100 mg IVPB daily x 4 days.   Tobie Lords, PharmD, BCPS Clinical Pharmacist 06/22/2019 6:55 AM

## 2019-06-22 NOTE — ED Notes (Signed)
Waiting on remdisivir from pharmacy.

## 2019-06-22 NOTE — Progress Notes (Addendum)
Patient arrived to unit. Breathing is even and unlabored, no distress noted. All safety measures are in place. All vital sings are stable. Will continue to monitor patient closely. Confirmed with Dr. Blaine Hamper that metoprolol and valsartan-hydrochlorothiazide were not supposed to be given as they were duplicates- he confirmed. All other medications given as ordered. Will continue to monitor patient closely. All safety measures are in place.

## 2019-06-22 NOTE — ED Notes (Signed)
Patient observed in waiting room, no acute distress noted.

## 2019-06-22 NOTE — Progress Notes (Signed)
Alerted Dr. Blaine Hamper that patient's HR is elevated from 110-115. Patient is asymptomatic. MD acknowledged, no new orders. Will continue to monitor.

## 2019-06-23 ENCOUNTER — Encounter: Payer: Self-pay | Admitting: Internal Medicine

## 2019-06-23 ENCOUNTER — Inpatient Hospital Stay: Payer: BC Managed Care – PPO

## 2019-06-23 LAB — BASIC METABOLIC PANEL
Anion gap: 8 (ref 5–15)
BUN: 20 mg/dL (ref 8–23)
CO2: 28 mmol/L (ref 22–32)
Calcium: 8.9 mg/dL (ref 8.9–10.3)
Chloride: 101 mmol/L (ref 98–111)
Creatinine, Ser: 0.77 mg/dL (ref 0.61–1.24)
GFR calc Af Amer: >60 mL/min (ref >60)
GFR calc non Af Amer: >60 mL/min (ref >60)
Glucose, Bld: 148 mg/dL — ABNORMAL HIGH (ref 70–99)
Potassium: 3.9 mmol/L (ref 3.5–5.1)
Sodium: 137 mmol/L (ref 135–145)

## 2019-06-23 LAB — GLUCOSE, CAPILLARY
Glucose-Capillary: 148 mg/dL — ABNORMAL HIGH (ref 70–99)
Glucose-Capillary: 197 mg/dL — ABNORMAL HIGH (ref 70–99)
Glucose-Capillary: 150 mg/dL — ABNORMAL HIGH (ref 70–99)
Glucose-Capillary: 191 mg/dL — ABNORMAL HIGH (ref 70–99)

## 2019-06-23 LAB — HEPATITIS B SURFACE ANTIGEN: Hepatitis B Surface Ag: NEGATIVE — AB

## 2019-06-23 LAB — CBC WITH DIFFERENTIAL/PLATELET
Abs Immature Granulocytes: 0.04 10*3/uL (ref 0.00–0.07)
Basophils Absolute: 0 10*3/uL (ref 0.0–0.1)
Basophils Relative: 0 %
Eosinophils Absolute: 0 10*3/uL (ref 0.0–0.5)
Eosinophils Relative: 0 %
HCT: 35.6 % — ABNORMAL LOW (ref 39.0–52.0)
Hemoglobin: 11.9 g/dL — ABNORMAL LOW (ref 13.0–17.0)
Immature Granulocytes: 1 %
Lymphocytes Relative: 13 %
Lymphs Abs: 1 10*3/uL (ref 0.7–4.0)
MCH: 30.1 pg (ref 26.0–34.0)
MCHC: 33.4 g/dL (ref 30.0–36.0)
MCV: 90.1 fL (ref 80.0–100.0)
Monocytes Absolute: 0.4 10*3/uL (ref 0.1–1.0)
Monocytes Relative: 5 %
Neutro Abs: 5.9 10*3/uL (ref 1.7–7.7)
Neutrophils Relative %: 81 %
Platelets: 193 10*3/uL (ref 150–400)
RBC: 3.95 MIL/uL — ABNORMAL LOW (ref 4.22–5.81)
RDW: 13.2 % (ref 11.5–15.5)
WBC: 7.3 10*3/uL (ref 4.0–10.5)
nRBC: 0 % (ref 0.0–0.2)

## 2019-06-23 LAB — MAGNESIUM: Magnesium: 2.2 mg/dL (ref 1.7–2.4)

## 2019-06-23 LAB — C-REACTIVE PROTEIN: CRP: 8.5 mg/dL — ABNORMAL HIGH (ref <1.0)

## 2019-06-23 LAB — HIV ANTIBODY (ROUTINE TESTING W REFLEX): HIV Screen 4th Generation wRfx: NONREACTIVE

## 2019-06-23 LAB — FERRITIN: Ferritin: 176 ng/mL (ref 24–336)

## 2019-06-23 LAB — FIBRIN DERIVATIVES D-DIMER (ARMC ONLY): Fibrin derivatives D-dimer (ARMC): 1440.35 ng/mL (FEU) — ABNORMAL HIGH (ref 0.00–499.00)

## 2019-06-23 MED ORDER — IOHEXOL 350 MG/ML SOLN
75.0000 mL | Freq: Once | INTRAVENOUS | Status: AC | PRN
  Administered 2019-06-23: 11:00:00 75 mL via INTRAVENOUS

## 2019-06-23 NOTE — Progress Notes (Signed)
This note also relates to the following rows which could not be included: Pulse Rate - Cannot attach notes to unvalidated device data Resp - Cannot attach notes to unvalidated device data SpO2 - Cannot attach notes to unvalidated device data    06/23/19 1900  Assess: MEWS Score  ECG Heart Rate (!) 110  Assess: MEWS Score  MEWS Temp 0  MEWS Systolic 0  MEWS Pulse 1  MEWS RR 1  MEWS LOC 0  MEWS Score 2  MEWS Score Color Yellow  Assess: if the MEWS score is Yellow or Red  Were vital signs taken at a resting state? Yes  Focused Assessment Documented focused assessment  Early Detection of Sepsis Score *See Row Information* Low  MEWS guidelines implemented *See Row Information* No, vital signs rechecked  Notify: Charge Nurse/RN  Name of Charge Nurse/RN Notified Phyliss  Date Charge Nurse/RN Notified 06/23/19  Time Charge Nurse/RN Notified 1945

## 2019-06-23 NOTE — Plan of Care (Signed)
  Problem: Education: Goal: Knowledge of General Education information will improve Description Including pain rating scale, medication(s)/side effects and non-pharmacologic comfort measures Outcome: Progressing   Problem: Health Behavior/Discharge Planning: Goal: Ability to manage health-related needs will improve Outcome: Progressing   

## 2019-06-23 NOTE — Progress Notes (Signed)
PROGRESS NOTE    Erik Allen  [id number] DOB: 01-01-1956 DOA: 06/22/2019 PCP: Ria Bush, MD      Assessment & Plan:   Principal Problem:   Acute hypoxemic respiratory failure due to COVID-19 [facility]) Active Problems:   Hyperlipidemia associated with type 2 diabetes mellitus ([place])   Essential hypertension   Asthma   GERD   Benign prostatic hyperplasia   Controlled type 2 diabetes mellitus without complication, without long-term current use of insulin ([place])  Acute hypoxemic respiratory failure: secondary to COVID-19. CXR is stable without obvious obvious infiltration, but the patient has new oxygen requirement, needs 3 L [place] oxygen. Continue on IV remdesivir, steroids, vitamin c and zinc. Continue on bronchodilators. Encourage incentive spirometry & flutter valve. Inflammatory markers are elevated.   COVID19 infection: Continue on IV remdesivir, steroids, vitamin c and zinc. Continue on bronchodilators. Encourage incentive spirometry & flutter valve. Inflammatory markers are elevated. Continue on supplemental oxygen and wean as tolerated  Transaminitis: likely secondary to [place]. Will continue to monitor   Hypokalemia: WNL today. Will continue to monitor   Hyperlipidemia: continue on statin   HTN: continue on home dose of metoprolol, irbesartan, HCTZ. Hydralazine prn  Asthma: w/o exacerbation. Continue on singulair and bronchodilators   GERD: continue on protonix  Benign prostatic hyperplasia: continue on finasteride   DM2: controlled.  Most recent A1c 6.2. Continue to hold home dose of metformin. Continue on SSI w/ acuchecks     DVT prophylaxis: lovenox Code Status: full  Family Communication:  Disposition Plan: likely d/c back home vs home health. Unlikely any barriers    Consultants:      Procedures:    Antimicrobials:   Status is: Inpatient  Remains inpatient appropriate because:IV treatments appropriate due to intensity of illness or  inability to take PO   Dispo: The patient is from: Home              Anticipated d/c is to: Home              Anticipated d/c date is: 2 days              Patient currently is not medically stable to d/c.     Subjective: Pt c/o shortness of breath.  Objective: Vitals:   06/23/19 0832 06/23/19 0833 06/23/19 0841 06/23/19 1208  BP: 115/76     Pulse: 98  (!) 105 (!) 104  Resp: (!) 22 20 20 17   Temp: 98.6 F (37 C)     TempSrc: Oral     SpO2: (!) 87% 94% 95% 100%  Weight:      Height:        Intake/Output Summary (Last 24 hours) at 06/23/2019 1353 Last data filed at 06/23/2019 0017 Gross per 24 hour  Intake --  Output 500 ml  Net -500 ml   Filed Weights   06/21/19 2059  Weight: 99.8 kg    Examination:  General exam: Appears calm and comfortable  Respiratory system: diminished breath sounds b/l  Cardiovascular system: S1 & S2 +. No rubs, gallops or clicks.  Gastrointestinal system: Abdomen is nondistended, soft and nontender. Normal bowel sounds heard. Central nervous system: Alert and oriented. Moves all 4 extremities  Psychiatry: Judgement and insight appear normal. Mood & affect appropriate.     Data Reviewed: I have personally reviewed following labs and imaging studies  CBC: Recent Labs  Lab 06/21/19 2106 06/23/19 0435  WBC 8.1 7.3  NEUTROABS 7.1 5.9  HGB 12.6* 11.9*  HCT 38.7* 35.6*  MCV 91.9 90.1  PLT 176 0000000   Basic Metabolic Panel: Recent Labs  Lab 06/21/19 2106 06/23/19 0435  NA 135 137  K 2.9* 3.9  CL 98 101  CO2 25 28  GLUCOSE 153* 148*  BUN 16 20  CREATININE 1.04 0.77  CALCIUM 8.7* 8.9  MG  --  2.2   GFR: Estimated Creatinine Clearance: 114.8 mL/min (by C-G formula based on SCr of 0.77 mg/dL). Liver Function Tests: Recent Labs  Lab 06/22/19 0812  AST 61*  ALT 50*  ALKPHOS 48  BILITOT 0.6  PROT 7.6  ALBUMIN 3.7   No results for input(s): LIPASE, AMYLASE in the last 168 hours. No results for input(s): AMMONIA in the  last 168 hours. Coagulation Profile: No results for input(s): INR, PROTIME in the last 168 hours. Cardiac Enzymes: No results for input(s): CKTOTAL, CKMB, CKMBINDEX, TROPONINI in the last 168 hours. BNP (last 3 results) No results for input(s): PROBNP in the last 8760 hours. HbA1C: No results for input(s): HGBA1C in the last 72 hours. CBG: Recent Labs  Lab 06/22/19 1151 06/22/19 1635 06/22/19 2054 06/23/19 0831 06/23/19 1150  GLUCAP 215* 120* 220* 148* 197*   Lipid Profile: Recent Labs    06/22/19 0812  TRIG 81   Thyroid Function Tests: No results for input(s): TSH, T4TOTAL, FREET4, T3FREE, THYROIDAB in the last 72 hours. Anemia Panel: Recent Labs    06/22/19 0812 06/23/19 0435  FERRITIN 153 176   Sepsis Labs: Recent Labs  Lab 06/22/19 0812  PROCALCITON 19.11    Recent Results (from the past 240 hour(s))  Culture, blood (Routine X 2) w Reflex to ID Panel     Status: None (Preliminary result)   Collection Time: 06/22/19  8:01 AM   Specimen: BLOOD  Result Value Ref Range Status   Specimen Description BLOOD BLOOD RIGHT HAND  Final   Special Requests   Final    BOTTLES DRAWN AEROBIC AND ANAEROBIC Blood Culture adequate volume   Culture   Final    NO GROWTH < 24 HOURS Performed at [facility], [address], [place], [place] [address]    Report Status PENDING  Incomplete  Culture, blood (Routine X 2) w Reflex to ID Panel     Status: None (Preliminary result)   Collection Time: 06/22/19  8:13 AM   Specimen: BLOOD  Result Value Ref Range Status   Specimen Description BLOOD BLOOD RIGHT FOREARM  Final   Special Requests   Final    BOTTLES DRAWN AEROBIC AND ANAEROBIC Blood Culture results may not be optimal due to an excessive volume of blood received in culture bottles   Culture   Final    NO GROWTH < 24 HOURS Performed at [facility], [address] [place], [place] [address]    Report Status PENDING  Incomplete         Radiology  Studies: DG Chest 2 View  Result Date: 06/21/2019 CLINICAL DATA:  Short of breath for 1 day, asthma EXAM: CHEST - 2 VIEW COMPARISON:  05/18/2019 FINDINGS: Frontal and lateral views of the chest demonstrate postsurgical changes from aortic valve replacement, stable. Cardiac silhouette is unremarkable. No airspace disease, effusion, or pneumothorax. Scattered areas of scarring are seen within the lung bases. No acute bony abnormalities. IMPRESSION: 1. Stable exam, no acute process. Electronically Signed   By: Randa Ngo M.D.   On: 06/21/2019 21:22   CT ANGIO CHEST PE W OR WO CONTRAST  Result  Date: 06/23/2019 CLINICAL DATA:  64 year old male with history of shortness of breath, hypoxia, fever, chills and dry cough. Elevated D-dimer. Suspected pulmonary embolism. Diagnosed with COVID-19 on 06/22/2019. EXAM: CT ANGIOGRAPHY CHEST WITH CONTRAST TECHNIQUE: Multidetector CT imaging of the chest was performed using the standard protocol during bolus administration of intravenous contrast. Multiplanar CT image reconstructions and MIPs were obtained to evaluate the vascular anatomy. CONTRAST:  44mL OMNIPAQUE IOHEXOL 350 MG/ML SOLN COMPARISON:  Chest CTA 04/17/2019. FINDINGS: Cardiovascular: No filling defects within the pulmonary arterial tree to suggest pulmonary embolism. Heart size is mildly enlarged. There is no significant pericardial fluid, thickening or pericardial calcification. There is aortic atherosclerosis, as well as atherosclerosis of the great vessels of the mediastinum and the coronary arteries, including calcified atherosclerotic plaque in the left anterior descending coronary arteries. Status post aortic valve replacement with a stented bioprosthesis. Mediastinum/Nodes: No pathologically enlarged mediastinal or hilar lymph nodes. Esophagus is unremarkable in appearance. No axillary lymphadenopathy. Lungs/Pleura: Widespread patchy areas of ground-glass attenuation and consolidative airspace disease  scattered throughout the lungs bilaterally, most evident throughout the mid to lower lungs, compatible with multilobar pneumonia. No pleural effusions. No definite suspicious appearing pulmonary nodules or masses are noted. Upper Abdomen: Diffuse low attenuation throughout the visualized hepatic parenchyma, indicative of severe hepatic steatosis. Musculoskeletal: Orthopedic fixation hardware in the anterior aspect of the right second and third ribs extending to the sternum. There are no aggressive appearing lytic or blastic lesions noted in the visualized portions of the skeleton. Review of the MIP images confirms the above findings. IMPRESSION: 1. No evidence of pulmonary embolism. 2. The appearance of the lungs is compatible with severe multilobar bilateral pneumonia, presumably from reported COVID-19 infection. 3. Aortic atherosclerosis, in addition to left anterior descending coronary artery disease. Please note that although the presence of coronary artery calcium documents the presence of coronary artery disease, the severity of this disease and any potential stenosis cannot be assessed on this non-gated CT examination. Assessment for potential risk factor modification, dietary therapy or pharmacologic therapy may be warranted, if clinically indicated. 4. Mild cardiomegaly. 5. Severe hepatic steatosis. 6. Additional incidental findings, as above. Aortic Atherosclerosis (ICD10-I70.0). Electronically Signed   By: Vinnie Langton M.D.   On: 06/23/2019 12:09        Scheduled Meds: . vitamin C  500 mg Oral Daily  . aspirin  325 mg Oral Daily  . atorvastatin  40 mg Oral Daily  . cholecalciferol  400 Units Oral Daily  . enoxaparin (LOVENOX) injection  40 mg Subcutaneous Q24H  . finasteride  5 mg Oral Daily  . hydrochlorothiazide  25 mg Oral Daily   And  . irbesartan  150 mg Oral Daily  . insulin aspart  0-5 Units Subcutaneous QHS  . insulin aspart  0-9 Units Subcutaneous TID WC  . ipratropium  2  puff Inhalation Q4H  . methylPREDNISolone (SOLU-MEDROL) injection  60 mg Intravenous Q12H  . metoprolol tartrate  75 mg Oral BID  . montelukast  10 mg Oral QHS  . pantoprazole  40 mg Oral QHS  . vitamin B-12  500 mcg Oral Daily  . zinc sulfate  220 mg Oral Daily   Continuous Infusions: . remdesivir 100 mg in NS 100 mL 100 mg (06/23/19 0938)     LOS: 1 day    Time spent: 33 mins     Wyvonnia Dusky, MD [facility] Pager [phone]  If 7PM-7AM, please contact night-coverage [email or web address] 06/23/2019, 1:53 PM

## 2019-06-24 LAB — COMPREHENSIVE METABOLIC PANEL
ALT: 45 U/L — ABNORMAL HIGH (ref 0–44)
AST: 42 U/L — ABNORMAL HIGH (ref 15–41)
Albumin: 3.4 g/dL — ABNORMAL LOW (ref 3.5–5.0)
Alkaline Phosphatase: 43 U/L (ref 38–126)
Anion gap: 10 (ref 5–15)
BUN: 24 mg/dL — ABNORMAL HIGH (ref 8–23)
CO2: 27 mmol/L (ref 22–32)
Calcium: 8.8 mg/dL — ABNORMAL LOW (ref 8.9–10.3)
Chloride: 98 mmol/L (ref 98–111)
Creatinine, Ser: 0.75 mg/dL (ref 0.61–1.24)
GFR calc Af Amer: >60 mL/min (ref >60)
GFR calc non Af Amer: >60 mL/min (ref >60)
Glucose, Bld: 150 mg/dL — ABNORMAL HIGH (ref 70–99)
Potassium: 3.6 mmol/L (ref 3.5–5.1)
Sodium: 135 mmol/L (ref 135–145)
Total Bilirubin: 0.5 mg/dL (ref 0.3–1.2)
Total Protein: 6.9 g/dL (ref 6.5–8.1)

## 2019-06-24 LAB — CBC WITH DIFFERENTIAL/PLATELET
Abs Immature Granulocytes: 0.05 10*3/uL (ref 0.00–0.07)
Basophils Absolute: 0 10*3/uL (ref 0.0–0.1)
Basophils Relative: 0 %
Eosinophils Absolute: 0 10*3/uL (ref 0.0–0.5)
Eosinophils Relative: 0 %
HCT: 38.3 % — ABNORMAL LOW (ref 39.0–52.0)
Hemoglobin: 12.6 g/dL — ABNORMAL LOW (ref 13.0–17.0)
Immature Granulocytes: 0 %
Lymphocytes Relative: 10 %
Lymphs Abs: 1.1 10*3/uL (ref 0.7–4.0)
MCH: 29.4 pg (ref 26.0–34.0)
MCHC: 32.9 g/dL (ref 30.0–36.0)
MCV: 89.5 fL (ref 80.0–100.0)
Monocytes Absolute: 0.6 10*3/uL (ref 0.1–1.0)
Monocytes Relative: 5 %
Neutro Abs: 9.7 10*3/uL — ABNORMAL HIGH (ref 1.7–7.7)
Neutrophils Relative %: 85 %
Platelets: 240 10*3/uL (ref 150–400)
RBC: 4.28 MIL/uL (ref 4.22–5.81)
RDW: 13.2 % (ref 11.5–15.5)
WBC: 11.5 10*3/uL — ABNORMAL HIGH (ref 4.0–10.5)
nRBC: 0 % (ref 0.0–0.2)

## 2019-06-24 LAB — FIBRIN DERIVATIVES D-DIMER (ARMC ONLY): Fibrin derivatives D-dimer (ARMC): 949.35 ng/mL (FEU) — ABNORMAL HIGH (ref 0.00–499.00)

## 2019-06-24 LAB — GLUCOSE, CAPILLARY
Glucose-Capillary: 146 mg/dL — ABNORMAL HIGH (ref 70–99)
Glucose-Capillary: 217 mg/dL — ABNORMAL HIGH (ref 70–99)
Glucose-Capillary: 121 mg/dL — ABNORMAL HIGH (ref 70–99)
Glucose-Capillary: 213 mg/dL — ABNORMAL HIGH (ref 70–99)

## 2019-06-24 LAB — FERRITIN: Ferritin: 152 ng/mL (ref 24–336)

## 2019-06-24 LAB — C-REACTIVE PROTEIN: CRP: 3.2 mg/dL — ABNORMAL HIGH (ref <1.0)

## 2019-06-24 MED ORDER — METOPROLOL TARTRATE 50 MG PO TABS
100.0000 mg | ORAL_TABLET | Freq: Two times a day (BID) | ORAL | Status: DC
  Administered 2019-06-24 – 2019-06-26 (×4): 100 mg via ORAL
  Filled 2019-06-24 (×4): qty 2

## 2019-06-24 NOTE — Progress Notes (Signed)
PROGRESS NOTE    Erik Allen  [id number] DOB: 1955/06/30 DOA: 06/22/2019 PCP: Ria Bush, MD      Assessment & Plan:   Principal Problem:   Acute hypoxemic respiratory failure due to COVID-19 [facility]) Active Problems:   Hyperlipidemia associated with type 2 diabetes mellitus ([place])   Essential hypertension   Asthma   GERD   Benign prostatic hyperplasia   Controlled type 2 diabetes mellitus without complication, without long-term current use of insulin ([place])  Acute hypoxemic respiratory failure: secondary to COVID-19. CXR is stable without obvious obvious infiltration,  - Continue on IV remdesivir day 3/5, steroids, vitamin c and zinc. Continue on bronchodilators. Encourage incentive spirometry & flutter valve. Inflammatory markers are elevated.  -Initially required 3 L oxygen but has been weaned off to room air now  COVID19 infection: Continue on IV remdesivir, steroids, vitamin c and zinc. Continue on bronchodilators. Encourage incentive spirometry & flutter valve. Inflammatory markers are elevated.  - weaned off to room air  Transaminitis: likely secondary to [place]. Will continue to monitor   Hypokalemia: WNL today. Will continue to monitor   Hyperlipidemia: continue on statin   HTN/sinus tachycardia: continue on home dose irbesartan, HCTZ. Hydralazine prn -Restart metoprolol dose to 100 mg p.o. twice a day for better control heart rate  Asthma: w/o exacerbation. Continue on singulair and bronchodilators   GERD: continue on protonix  Benign prostatic hyperplasia: continue on finasteride   DM2: controlled.  Most recent A1c 6.2. Continue to hold home dose of metformin. Continue on SSI w/ acuchecks     DVT prophylaxis: lovenox Code Status: full  Family Communication:  Disposition Plan: likely d/c back home vs home health. Unlikely any barriers    Status is: Inpatient  Remains inpatient appropriate because:IV treatments appropriate due to  intensity of illness or inability to take PO   Dispo: The patient is from: Home              Anticipated d/c is to: Home              Anticipated d/c date is: 2 days              Patient currently is not medically stable to d/c.  Needs to finish 2 more days of remdesivir.  He is still tachycardic and tachypneic at times and does not feel comfortable finishing it as an outpatient     Subjective: Overall feels better, tired.  Mentions having open heart surgery at [facility] few weeks ago  Objective: Vitals:   06/24/19 0805 06/24/19 0806 06/24/19 0924 06/24/19 1030  BP: 129/86     Pulse: 100  (!) 104 95  Resp: (!) 22 20 20    Temp: 98.4 F (36.9 C)     TempSrc: Oral     SpO2: 91%  92%   Weight:      Height:        Intake/Output Summary (Last 24 hours) at 06/24/2019 1655 Last data filed at 06/24/2019 1145 Gross per 24 hour  Intake 240 ml  Output 875 ml  Net -635 ml   Filed Weights   06/21/19 2059  Weight: 99.8 kg    Examination:  General exam: Appears calm and comfortable  Respiratory system: diminished breath sounds b/l  Cardiovascular system: S1 & S2 +. No rubs, gallops or clicks.  Gastrointestinal system: Abdomen is nondistended, soft and nontender. Normal bowel sounds heard. Central nervous system: Alert and oriented. Moves all 4 extremities  Psychiatry: Judgement and insight  appear normal. Mood & affect appropriate.     Data Reviewed: I have personally reviewed following labs and imaging studies  CBC: Recent Labs  Lab 06/21/19 2106 06/23/19 0435 06/24/19 0508  WBC 8.1 7.3 11.5*  NEUTROABS 7.1 5.9 9.7*  HGB 12.6* 11.9* 12.6*  HCT 38.7* 35.6* 38.3*  MCV 91.9 90.1 89.5  PLT 176 193 [id number]   Basic Metabolic Panel: Recent Labs  Lab 06/21/19 2106 06/23/19 0435 06/24/19 0508  NA 135 137 135  K 2.9* 3.9 3.6  CL 98 101 98  CO2 25 28 27   GLUCOSE 153* 148* 150*  BUN 16 20 24*  CREATININE 1.04 0.77 0.75  CALCIUM 8.7* 8.9 8.8*  MG  --  2.2  --     GFR: Estimated Creatinine Clearance: 114.8 mL/min (by C-G formula based on SCr of 0.75 mg/dL). Liver Function Tests: Recent Labs  Lab 06/22/19 0812 06/24/19 0508  AST 61* 42*  ALT 50* 45*  ALKPHOS 48 43  BILITOT 0.6 0.5  PROT 7.6 6.9  ALBUMIN 3.7 3.4*   No results for input(s): LIPASE, AMYLASE in the last 168 hours. No results for input(s): AMMONIA in the last 168 hours. Coagulation Profile: No results for input(s): INR, PROTIME in the last 168 hours. Cardiac Enzymes: No results for input(s): CKTOTAL, CKMB, CKMBINDEX, TROPONINI in the last 168 hours. BNP (last 3 results) No results for input(s): PROBNP in the last 8760 hours. HbA1C: No results for input(s): HGBA1C in the last 72 hours. CBG: Recent Labs  Lab 06/23/19 1645 06/23/19 2057 06/24/19 0807 06/24/19 1154 06/24/19 1634  GLUCAP 150* 191* 146* 217* 121*   Lipid Profile: Recent Labs    06/22/19 0812  TRIG 81   Thyroid Function Tests: No results for input(s): TSH, T4TOTAL, FREET4, T3FREE, THYROIDAB in the last 72 hours. Anemia Panel: Recent Labs    06/23/19 0435 06/24/19 0508  FERRITIN 176 152   Sepsis Labs: Recent Labs  Lab 06/22/19 0812  PROCALCITON 19.11    Recent Results (from the past 240 hour(s))  Culture, blood (Routine X 2) w Reflex to ID Panel     Status: None (Preliminary result)   Collection Time: 06/22/19  8:01 AM   Specimen: BLOOD  Result Value Ref Range Status   Specimen Description BLOOD BLOOD RIGHT HAND  Final   Special Requests   Final    BOTTLES DRAWN AEROBIC AND ANAEROBIC Blood Culture adequate volume   Culture   Final    NO GROWTH 2 DAYS Performed at [facility], [address] [place], [place] [address]    Report Status PENDING  Incomplete  Culture, blood (Routine X 2) w Reflex to ID Panel     Status: None (Preliminary result)   Collection Time: 06/22/19  8:13 AM   Specimen: BLOOD  Result Value Ref Range Status   Specimen Description BLOOD BLOOD RIGHT  FOREARM  Final   Special Requests   Final    BOTTLES DRAWN AEROBIC AND ANAEROBIC Blood Culture results may not be optimal due to an excessive volume of blood received in culture bottles   Culture   Final    NO GROWTH 2 DAYS Performed at [facility], [address] [place], [place] [address]    Report Status PENDING  Incomplete         Radiology Studies: CT ANGIO CHEST PE W OR WO CONTRAST  Result Date: 06/23/2019 CLINICAL DATA:  64 year old male with history of shortness of breath, hypoxia, fever, chills and dry  cough. Elevated D-dimer. Suspected pulmonary embolism. Diagnosed with COVID-19 on 06/22/2019. EXAM: CT ANGIOGRAPHY CHEST WITH CONTRAST TECHNIQUE: Multidetector CT imaging of the chest was performed using the standard protocol during bolus administration of intravenous contrast. Multiplanar CT image reconstructions and MIPs were obtained to evaluate the vascular anatomy. CONTRAST:  23mL OMNIPAQUE IOHEXOL 350 MG/ML SOLN COMPARISON:  Chest CTA 04/17/2019. FINDINGS: Cardiovascular: No filling defects within the pulmonary arterial tree to suggest pulmonary embolism. Heart size is mildly enlarged. There is no significant pericardial fluid, thickening or pericardial calcification. There is aortic atherosclerosis, as well as atherosclerosis of the great vessels of the mediastinum and the coronary arteries, including calcified atherosclerotic plaque in the left anterior descending coronary arteries. Status post aortic valve replacement with a stented bioprosthesis. Mediastinum/Nodes: No pathologically enlarged mediastinal or hilar lymph nodes. Esophagus is unremarkable in appearance. No axillary lymphadenopathy. Lungs/Pleura: Widespread patchy areas of ground-glass attenuation and consolidative airspace disease scattered throughout the lungs bilaterally, most evident throughout the mid to lower lungs, compatible with multilobar pneumonia. No pleural effusions. No definite suspicious  appearing pulmonary nodules or masses are noted. Upper Abdomen: Diffuse low attenuation throughout the visualized hepatic parenchyma, indicative of severe hepatic steatosis. Musculoskeletal: Orthopedic fixation hardware in the anterior aspect of the right second and third ribs extending to the sternum. There are no aggressive appearing lytic or blastic lesions noted in the visualized portions of the skeleton. Review of the MIP images confirms the above findings. IMPRESSION: 1. No evidence of pulmonary embolism. 2. The appearance of the lungs is compatible with severe multilobar bilateral pneumonia, presumably from reported COVID-19 infection. 3. Aortic atherosclerosis, in addition to left anterior descending coronary artery disease. Please note that although the presence of coronary artery calcium documents the presence of coronary artery disease, the severity of this disease and any potential stenosis cannot be assessed on this non-gated CT examination. Assessment for potential risk factor modification, dietary therapy or pharmacologic therapy may be warranted, if clinically indicated. 4. Mild cardiomegaly. 5. Severe hepatic steatosis. 6. Additional incidental findings, as above. Aortic Atherosclerosis (ICD10-I70.0). Electronically Signed   By: Vinnie Langton M.D.   On: 06/23/2019 12:09        Scheduled Meds: . vitamin C  500 mg Oral Daily  . aspirin  325 mg Oral Daily  . atorvastatin  40 mg Oral Daily  . cholecalciferol  400 Units Oral Daily  . enoxaparin (LOVENOX) injection  40 mg Subcutaneous Q24H  . finasteride  5 mg Oral Daily  . hydrochlorothiazide  25 mg Oral Daily   And  . irbesartan  150 mg Oral Daily  . insulin aspart  0-5 Units Subcutaneous QHS  . insulin aspart  0-9 Units Subcutaneous TID WC  . ipratropium  2 puff Inhalation Q4H  . methylPREDNISolone (SOLU-MEDROL) injection  60 mg Intravenous Q12H  . metoprolol tartrate  75 mg Oral BID  . montelukast  10 mg Oral QHS  .  pantoprazole  40 mg Oral QHS  . vitamin B-12  500 mcg Oral Daily  . zinc sulfate  220 mg Oral Daily   Continuous Infusions: . remdesivir 100 mg in NS 100 mL 100 mg (06/24/19 0845)     LOS: 2 days    Time spent: 92 mins     Shun Pletz Manuella Ghazi, MD [facility] Pager [phone]  If 7PM-7AM, please contact night-coverage [email or web address] 06/24/2019, 4:55 PM

## 2019-06-25 ENCOUNTER — Ambulatory Visit: Payer: BC Managed Care – PPO | Admitting: Thoracic Surgery (Cardiothoracic Vascular Surgery)

## 2019-06-25 DIAGNOSIS — N400 Enlarged prostate without lower urinary tract symptoms: Secondary | ICD-10-CM

## 2019-06-25 LAB — CBC WITH DIFFERENTIAL/PLATELET
Abs Immature Granulocytes: 0.07 10*3/uL (ref 0.00–0.07)
Basophils Absolute: 0 10*3/uL (ref 0.0–0.1)
Basophils Relative: 0 %
Eosinophils Absolute: 0 10*3/uL (ref 0.0–0.5)
Eosinophils Relative: 0 %
HCT: 37.8 % — ABNORMAL LOW (ref 39.0–52.0)
Hemoglobin: 12.6 g/dL — ABNORMAL LOW (ref 13.0–17.0)
Immature Granulocytes: 1 %
Lymphocytes Relative: 13 %
Lymphs Abs: 1.5 10*3/uL (ref 0.7–4.0)
MCH: 29.7 pg (ref 26.0–34.0)
MCHC: 33.3 g/dL (ref 30.0–36.0)
MCV: 89.2 fL (ref 80.0–100.0)
Monocytes Absolute: 0.6 10*3/uL (ref 0.1–1.0)
Monocytes Relative: 5 %
Neutro Abs: 9.4 10*3/uL — ABNORMAL HIGH (ref 1.7–7.7)
Neutrophils Relative %: 81 %
Platelets: 259 10*3/uL (ref 150–400)
RBC: 4.24 MIL/uL (ref 4.22–5.81)
RDW: 13.1 % (ref 11.5–15.5)
WBC: 11.6 10*3/uL — ABNORMAL HIGH (ref 4.0–10.5)
nRBC: 0 % (ref 0.0–0.2)

## 2019-06-25 LAB — GLUCOSE, CAPILLARY
Glucose-Capillary: 166 mg/dL — ABNORMAL HIGH (ref 70–99)
Glucose-Capillary: 220 mg/dL — ABNORMAL HIGH (ref 70–99)
Glucose-Capillary: 127 mg/dL — ABNORMAL HIGH (ref 70–99)
Glucose-Capillary: 213 mg/dL — ABNORMAL HIGH (ref 70–99)

## 2019-06-25 LAB — BASIC METABOLIC PANEL
Anion gap: 8 (ref 5–15)
BUN: 25 mg/dL — ABNORMAL HIGH (ref 8–23)
CO2: 26 mmol/L (ref 22–32)
Calcium: 8.7 mg/dL — ABNORMAL LOW (ref 8.9–10.3)
Chloride: 103 mmol/L (ref 98–111)
Creatinine, Ser: 0.76 mg/dL (ref 0.61–1.24)
GFR calc Af Amer: >60 mL/min (ref >60)
GFR calc non Af Amer: >60 mL/min (ref >60)
Glucose, Bld: 149 mg/dL — ABNORMAL HIGH (ref 70–99)
Potassium: 3.7 mmol/L (ref 3.5–5.1)
Sodium: 137 mmol/L (ref 135–145)

## 2019-06-25 LAB — FIBRIN DERIVATIVES D-DIMER (ARMC ONLY): Fibrin derivatives D-dimer (ARMC): 678.67 ng/mL (FEU) — ABNORMAL HIGH (ref 0.00–499.00)

## 2019-06-25 LAB — C-REACTIVE PROTEIN: CRP: 1.2 mg/dL — ABNORMAL HIGH (ref <1.0)

## 2019-06-25 LAB — FERRITIN: Ferritin: 141 ng/mL (ref 24–336)

## 2019-06-25 NOTE — Progress Notes (Addendum)
PROGRESS NOTE    Erik Allen  [id number] DOB: 25-Jan-1956 DOA: 06/22/2019 PCP: Ria Bush, MD      Assessment & Plan:   Principal Problem:   Acute hypoxemic respiratory failure due to COVID-19 [facility]) Active Problems:   Hyperlipidemia associated with type 2 diabetes mellitus ([place])   Essential hypertension   Asthma   GERD   Benign prostatic hyperplasia   Controlled type 2 diabetes mellitus without complication, without long-term current use of insulin ([place])  Acute hypoxemic respiratory failure: secondary to COVID-19. CXR is stable without obvious obvious infiltration,  - Continue on IV remdesivir day 4/5, steroids, vitamin c and zinc. Continue on bronchodilators. Encourage incentive spirometry & flutter valve. Inflammatory markers are elevated.  -Initially required 3 L oxygen but has been weaned off to room air now -CT 2 days ago was neg for PE  COVID19 infection: Continue on IV remdesivir, steroids, vitamin c and zinc. Continue on bronchodilators. Encourage incentive spirometry & flutter valve. Inflammatory markers are elevated.  - weaned off to room air  Transaminitis: likely secondary to [place]. Will continue to monitor   Hypokalemia: WNL today. Will continue to monitor   Hyperlipidemia: continue on statin   HTN/sinus tachycardia: continue on home dose irbesartan, HCTZ. Hydralazine prn -Continue metoprolol 100 mg p.o. twice a day for better control heart rate -CT angio chest neg for PE  Asthma: w/o exacerbation. Continue on singulair and bronchodilators   GERD: continue on protonix  Benign prostatic hyperplasia: continue on finasteride   DM2: controlled.  Most recent A1c 6.2. Continue to hold home dose of metformin. Continue on SSI w/ acuchecks     DVT prophylaxis: lovenox Code Status: full  Family Communication:  Disposition Plan: likely d/c back home vs home health. Unlikely any barriers    Status is: Inpatient  Remains inpatient  appropriate because:IV treatments appropriate due to intensity of illness or inability to take PO   Dispo: The patient is from: Home              Anticipated d/c is to: Home              Anticipated d/c date is: 1 day              Patient currently is not medically stable to d/c.  Needs to finish 1 more days of remdesivir.  He is still tachycardic and tachypneic at times     Subjective: Tachycardic with a heart rate of 130 while in the bed although he mentions just coming out of the restroom.  No symptoms  Objective: Vitals:   06/24/19 1030 06/24/19 1636 06/24/19 2000 06/25/19 0802  BP:  114/70 (!) 160/89 134/88  Pulse: 95 89 (!) 110 92  Resp:  20 20 (!) 21  Temp:  97.7 F (36.5 C) (!) 97.4 F (36.3 C) 97.6 F (36.4 C)  TempSrc:   Oral Oral  SpO2:  96% 92% 90%  Weight:      Height:        Intake/Output Summary (Last 24 hours) at 06/25/2019 1319 Last data filed at 06/25/2019 0900 Gross per 24 hour  Intake 240 ml  Output 1175 ml  Net -935 ml   Filed Weights   06/21/19 2059  Weight: 99.8 kg    Examination:  General exam: Appears calm and comfortable  Respiratory system: diminished breath sounds b/l  Cardiovascular system: S1 & S2 +. No rubs, gallops or clicks.  Gastrointestinal system: Abdomen is nondistended, soft and nontender. Normal bowel  sounds heard. Central nervous system: Alert and oriented. Moves all 4 extremities  Psychiatry: Judgement and insight appear normal. Mood & affect appropriate.     Data Reviewed: I have personally reviewed following labs and imaging studies  CBC: Recent Labs  Lab 06/21/19 2106 06/23/19 0435 06/24/19 0508 06/25/19 0523  WBC 8.1 7.3 11.5* 11.6*  NEUTROABS 7.1 5.9 9.7* 9.4*  HGB 12.6* 11.9* 12.6* 12.6*  HCT 38.7* 35.6* 38.3* 37.8*  MCV 91.9 90.1 89.5 89.2  PLT 176 193 240 [id number]   Basic Metabolic Panel: Recent Labs  Lab 06/21/19 2106 06/23/19 0435 06/24/19 0508 06/25/19 0523  NA 135 137 135 137  K 2.9* 3.9 3.6 3.7   CL 98 101 98 103  CO2 25 28 27 26   GLUCOSE 153* 148* 150* 149*  BUN 16 20 24* 25*  CREATININE 1.04 0.77 0.75 0.76  CALCIUM 8.7* 8.9 8.8* 8.7*  MG  --  2.2  --   --    GFR: Estimated Creatinine Clearance: 114.8 mL/min (by C-G formula based on SCr of 0.76 mg/dL). Liver Function Tests: Recent Labs  Lab 06/22/19 0812 06/24/19 0508  AST 61* 42*  ALT 50* 45*  ALKPHOS 48 43  BILITOT 0.6 0.5  PROT 7.6 6.9  ALBUMIN 3.7 3.4*   CBG: Recent Labs  Lab 06/24/19 1154 06/24/19 1634 06/24/19 2053 06/25/19 0800 06/25/19 1147  GLUCAP 217* 121* 213* 166* 220*   Lipid Profile: No results for input(s): CHOL, HDL, LDLCALC, TRIG, CHOLHDL, LDLDIRECT in the last 72 hours. Thyroid Function Tests: No results for input(s): TSH, T4TOTAL, FREET4, T3FREE, THYROIDAB in the last 72 hours. Anemia Panel: Recent Labs    06/24/19 0508 06/25/19 0523  FERRITIN 152 141   Sepsis Labs: Recent Labs  Lab 06/22/19 0812  PROCALCITON 19.11    Recent Results (from the past 240 hour(s))  Culture, blood (Routine X 2) w Reflex to ID Panel     Status: None (Preliminary result)   Collection Time: 06/22/19  8:01 AM   Specimen: BLOOD  Result Value Ref Range Status   Specimen Description BLOOD BLOOD RIGHT HAND  Final   Special Requests   Final    BOTTLES DRAWN AEROBIC AND ANAEROBIC Blood Culture adequate volume   Culture   Final    NO GROWTH 3 DAYS Performed at [facility], [address] [place], [place] [address]    Report Status PENDING  Incomplete  Culture, blood (Routine X 2) w Reflex to ID Panel     Status: None (Preliminary result)   Collection Time: 06/22/19  8:13 AM   Specimen: BLOOD  Result Value Ref Range Status   Specimen Description BLOOD BLOOD RIGHT FOREARM  Final   Special Requests   Final    BOTTLES DRAWN AEROBIC AND ANAEROBIC Blood Culture results may not be optimal due to an excessive volume of blood received in culture bottles   Culture   Final    NO GROWTH 3  DAYS Performed at [facility], [address] [place],  [address]    Report Status PENDING  Incomplete         Radiology Studies: No results found.      Scheduled Meds: . vitamin C  500 mg Oral Daily  . aspirin  325 mg Oral Daily  . atorvastatin  40 mg Oral Daily  . cholecalciferol  400 Units Oral Daily  . enoxaparin (LOVENOX) injection  40 mg Subcutaneous Q24H  . finasteride  5 mg Oral Daily  .  hydrochlorothiazide  25 mg Oral Daily   And  . irbesartan  150 mg Oral Daily  . insulin aspart  0-5 Units Subcutaneous QHS  . insulin aspart  0-9 Units Subcutaneous TID WC  . ipratropium  2 puff Inhalation Q4H  . methylPREDNISolone (SOLU-MEDROL) injection  60 mg Intravenous Q12H  . metoprolol tartrate  100 mg Oral BID  . montelukast  10 mg Oral QHS  . pantoprazole  40 mg Oral QHS  . vitamin B-12  500 mcg Oral Daily  . zinc sulfate  220 mg Oral Daily   Continuous Infusions: . remdesivir 100 mg in NS 100 mL 100 mg (06/25/19 0858)     LOS: 3 days    Time spent: 35 mins     Tamesha Ellerbrock Manuella Ghazi, MD [facility] Pager [phone]  If 7PM-7AM, please contact night-coverage [email or web address] 06/25/2019, 1:19 PM

## 2019-06-26 DIAGNOSIS — R0902 Hypoxemia: Secondary | ICD-10-CM

## 2019-06-26 DIAGNOSIS — R0602 Shortness of breath: Secondary | ICD-10-CM

## 2019-06-26 LAB — GLUCOSE, CAPILLARY
Glucose-Capillary: 157 mg/dL — ABNORMAL HIGH (ref 70–99)
Glucose-Capillary: 215 mg/dL — ABNORMAL HIGH (ref 70–99)

## 2019-06-26 LAB — CBC WITH DIFFERENTIAL/PLATELET
Abs Immature Granulocytes: 0.17 10*3/uL — ABNORMAL HIGH (ref 0.00–0.07)
Basophils Absolute: 0 10*3/uL (ref 0.0–0.1)
Basophils Relative: 0 %
Eosinophils Absolute: 0 10*3/uL (ref 0.0–0.5)
Eosinophils Relative: 0 %
HCT: 39.2 % (ref 39.0–52.0)
Hemoglobin: 12.8 g/dL — ABNORMAL LOW (ref 13.0–17.0)
Immature Granulocytes: 1 %
Lymphocytes Relative: 14 %
Lymphs Abs: 1.7 10*3/uL (ref 0.7–4.0)
MCH: 29.6 pg (ref 26.0–34.0)
MCHC: 32.7 g/dL (ref 30.0–36.0)
MCV: 90.7 fL (ref 80.0–100.0)
Monocytes Absolute: 0.7 10*3/uL (ref 0.1–1.0)
Monocytes Relative: 6 %
Neutro Abs: 9.3 10*3/uL — ABNORMAL HIGH (ref 1.7–7.7)
Neutrophils Relative %: 79 %
Platelets: 283 10*3/uL (ref 150–400)
RBC: 4.32 MIL/uL (ref 4.22–5.81)
RDW: 12.9 % (ref 11.5–15.5)
Smear Review: NORMAL
WBC: 11.9 10*3/uL — ABNORMAL HIGH (ref 4.0–10.5)
nRBC: 0 % (ref 0.0–0.2)

## 2019-06-26 LAB — FERRITIN: Ferritin: 120 ng/mL (ref 24–336)

## 2019-06-26 LAB — C-REACTIVE PROTEIN: CRP: 0.6 mg/dL (ref <1.0)

## 2019-06-26 LAB — FIBRIN DERIVATIVES D-DIMER (ARMC ONLY): Fibrin derivatives D-dimer (ARMC): 667.34 ng/mL (FEU) — ABNORMAL HIGH (ref 0.00–499.00)

## 2019-06-26 MED ORDER — ASCORBIC ACID 500 MG PO TABS: 500.0000 mg | ORAL_TABLET | Freq: Every day | ORAL | 0 refills | Status: AC

## 2019-06-26 MED ORDER — ZINC SULFATE 220 (50 ZN) MG PO CAPS: 220.0000 mg | ORAL_CAPSULE | Freq: Every day | ORAL | 0 refills | Status: AC

## 2019-06-26 MED ORDER — METOPROLOL TARTRATE 100 MG PO TABS: 100.0000 mg | ORAL_TABLET | Freq: Two times a day (BID) | ORAL | 0 refills | Status: DC

## 2019-06-26 NOTE — Discharge Instructions (Signed)
COVID-19 COVID-19 is a respiratory infection that is caused by a virus called severe acute respiratory syndrome coronavirus 2 (SARS-CoV-2). The disease is also known as coronavirus disease or novel coronavirus. In some people, the virus may not cause any symptoms. In others, it may cause a serious infection. The infection can get worse quickly and can lead to complications, such as:  Pneumonia, or infection of the lungs.  Acute respiratory distress syndrome or ARDS. This is a condition in which fluid build-up in the lungs prevents the lungs from filling with air and passing oxygen into the blood.  Acute respiratory failure. This is a condition in which there is not enough oxygen passing from the lungs to the body or when carbon dioxide is not passing from the lungs out of the body.  Sepsis or septic shock. This is a serious bodily reaction to an infection.  Blood clotting problems.  Secondary infections due to bacteria or fungus.  Organ failure. This is when your body's organs stop working. The virus that causes COVID-19 is contagious. This means that it can spread from person to person through droplets from coughs and sneezes (respiratory secretions). What are the causes? This illness is caused by a virus. You may catch the virus by:  Breathing in droplets from an infected person. Droplets can be spread by a person breathing, speaking, singing, coughing, or sneezing.  Touching something, like a table or a doorknob, that was exposed to the virus (contaminated) and then touching your mouth, nose, or eyes. What increases the risk? Risk for infection You are more likely to be infected with this virus if you:  Are within 6 feet (2 meters) of a person with COVID-19.  Provide care for or live with a person who is infected with COVID-19.  Spend time in crowded indoor spaces or live in shared housing. Risk for serious illness You are more likely to become seriously ill from the virus if  you:  Are 50 years of age or older. The higher your age, the more you are at risk for serious illness.  Live in a nursing home or long-term care facility.  Have cancer.  Have a long-term (chronic) disease such as: ? Chronic lung disease, including chronic obstructive pulmonary disease or asthma. ? A long-term disease that lowers your body's ability to fight infection (immunocompromised). ? Heart disease, including heart failure, a condition in which the arteries that lead to the heart become narrow or blocked (coronary artery disease), a disease which makes the heart muscle thick, weak, or stiff (cardiomyopathy). ? Diabetes. ? Chronic kidney disease. ? Sickle cell disease, a condition in which red blood cells have an abnormal "sickle" shape. ? Liver disease.  Are obese. What are the signs or symptoms? Symptoms of this condition can range from mild to severe. Symptoms may appear any time from 2 to 14 days after being exposed to the virus. They include:  A fever or chills.  A cough.  Difficulty breathing.  Headaches, body aches, or muscle aches.  Runny or stuffy (congested) nose.  A sore throat.  New loss of taste or smell. Some people may also have stomach problems, such as nausea, vomiting, or diarrhea. Other people may not have any symptoms of COVID-19. How is this diagnosed? This condition may be diagnosed based on:  Your signs and symptoms, especially if: ? You live in an area with a COVID-19 outbreak. ? You recently traveled to or from an area where the virus is common. ? You   provide care for or live with a person who was diagnosed with COVID-19. ? You were exposed to a person who was diagnosed with COVID-19.  A physical exam.  Lab tests, which may include: ? Taking a sample of fluid from the back of your nose and throat (nasopharyngeal fluid), your nose, or your throat using a swab. ? A sample of mucus from your lungs (sputum). ? Blood tests.  Imaging tests,  which may include, X-rays, CT scan, or ultrasound. How is this treated? At present, there is no medicine to treat COVID-19. Medicines that treat other diseases are being used on a trial basis to see if they are effective against COVID-19. Your health care provider will talk with you about ways to treat your symptoms. For most people, the infection is mild and can be managed at home with rest, fluids, and over-the-counter medicines. Treatment for a serious infection usually takes places in a hospital intensive care unit (ICU). It may include one or more of the following treatments. These treatments are given until your symptoms improve.  Receiving fluids and medicines through an IV.  Supplemental oxygen. Extra oxygen is given through a tube in the nose, a face mask, or a hood.  Positioning you to lie on your stomach (prone position). This makes it easier for oxygen to get into the lungs.  Continuous positive airway pressure (CPAP) or bi-level positive airway pressure (BPAP) machine. This treatment uses mild air pressure to keep the airways open. A tube that is connected to a motor delivers oxygen to the body.  Ventilator. This treatment moves air into and out of the lungs by using a tube that is placed in your windpipe.  Tracheostomy. This is a procedure to create a hole in the neck so that a breathing tube can be inserted.  Extracorporeal membrane oxygenation (ECMO). This procedure gives the lungs a chance to recover by taking over the functions of the heart and lungs. It supplies oxygen to the body and removes carbon dioxide. Follow these instructions at home: Lifestyle  If you are sick, stay home except to get medical care. Your health care provider will tell you how long to stay home. Call your health care provider before you go for medical care.  Rest at home as told by your health care provider.  Do not use any products that contain nicotine or tobacco, such as cigarettes,  e-cigarettes, and chewing tobacco. If you need help quitting, ask your health care provider.  Return to your normal activities as told by your health care provider. Ask your health care provider what activities are safe for you. General instructions  Take over-the-counter and prescription medicines only as told by your health care provider.  Drink enough fluid to keep your urine pale yellow.  Keep all follow-up visits as told by your health care provider. This is important. How is this prevented?  There is no vaccine to help prevent COVID-19 infection. However, there are steps you can take to protect yourself and others from this virus. To protect yourself:   Do not travel to areas where COVID-19 is a risk. The areas where COVID-19 is reported change often. To identify high-risk areas and travel restrictions, check the CDC travel website: wwwnc.cdc.gov/travel/notices  If you live in, or must travel to, an area where COVID-19 is a risk, take precautions to avoid infection. ? Stay away from people who are sick. ? Wash your hands often with soap and water for 20 seconds. If soap and water   are not available, use an alcohol-based hand sanitizer. ? Avoid touching your mouth, face, eyes, or nose. ? Avoid going out in public, follow guidance from your state and local health authorities. ? If you must go out in public, wear a cloth face covering or face mask. Make sure your mask covers your nose and mouth. ? Avoid crowded indoor spaces. Stay at least 6 feet (2 meters) away from others. ? Disinfect objects and surfaces that are frequently touched every day. This may include:  Counters and tables.  Doorknobs and light switches.  Sinks and faucets.  Electronics, such as phones, remote controls, keyboards, computers, and tablets. To protect others: If you have symptoms of COVID-19, take steps to prevent the virus from spreading to others.  If you think you have a COVID-19 infection, contact  your health care provider right away. Tell your health care team that you think you may have a COVID-19 infection.  Stay home. Leave your house only to seek medical care. Do not use public transport.  Do not travel while you are sick.  Wash your hands often with soap and water for 20 seconds. If soap and water are not available, use alcohol-based hand sanitizer.  Stay away from other members of your household. Let healthy household members care for children and pets, if possible. If you have to care for children or pets, wash your hands often and wear a mask. If possible, stay in your own room, separate from others. Use a different bathroom.  Make sure that all people in your household wash their hands well and often.  Cough or sneeze into a tissue or your sleeve or elbow. Do not cough or sneeze into your hand or into the air.  Wear a cloth face covering or face mask. Make sure your mask covers your nose and mouth. Where to find more information  Centers for Disease Control and Prevention: www.cdc.gov/coronavirus/2019-ncov/index.html  World Health Organization: www.who.int/health-topics/coronavirus Contact a health care provider if:  You live in or have traveled to an area where COVID-19 is a risk and you have symptoms of the infection.  You have had contact with someone who has COVID-19 and you have symptoms of the infection. Get help right away if:  You have trouble breathing.  You have pain or pressure in your chest.  You have confusion.  You have bluish lips and fingernails.  You have difficulty waking from sleep.  You have symptoms that get worse. These symptoms may represent a serious problem that is an emergency. Do not wait to see if the symptoms will go away. Get medical help right away. Call your local emergency services (911 in the [place] Do not drive yourself to the hospital. Let the emergency medical personnel know if you think you have  COVID-19. Summary  COVID-19 is a respiratory infection that is caused by a virus. It is also known as coronavirus disease or novel coronavirus. It can cause serious infections, such as pneumonia, acute respiratory distress syndrome, acute respiratory failure, or sepsis.  The virus that causes COVID-19 is contagious. This means that it can spread from person to person through droplets from breathing, speaking, singing, coughing, or sneezing.  You are more likely to develop a serious illness if you are 50 years of age or older, have a weak immune system, live in a nursing home, or have chronic disease.  There is no medicine to treat COVID-19. Your health care provider will talk with you about ways to treat your symptoms.    Take steps to protect yourself and others from infection. Wash your hands often and disinfect objects and surfaces that are frequently touched every day. Stay away from people who are sick and wear a mask if you are sick. This information is not intended to replace advice given to you by your health care provider. Make sure you discuss any questions you have with your health care provider. Document Revised: 11/28/2018 Document Reviewed: 03/06/2018 Elsevier Patient Education  2020 [place].  COVID-19: How to Protect Yourself and Others Know how it spreads  There is currently no vaccine to prevent coronavirus disease 2019 (COVID-19).  The best way to prevent illness is to avoid being exposed to this virus.  The virus is thought to spread mainly from person-to-person. ? Between people who are in close contact with one another (within about 6 feet). ? Through respiratory droplets produced when an infected person coughs, sneezes or talks. ? These droplets can land in the mouths or noses of people who are nearby or possibly be inhaled into the lungs. ? COVID-19 may be spread by people who are not showing symptoms. Everyone should Clean your hands often  Wash your hands  often with soap and water for at least 20 seconds especially after you have been in a public place, or after blowing your nose, coughing, or sneezing.  If soap and water are not readily available, use a hand sanitizer that contains at least 60% alcohol. Cover all surfaces of your hands and rub them together until they feel dry.  Avoid touching your eyes, nose, and mouth with unwashed hands. Avoid close contact  Limit contact with others as much as possible.  Avoid close contact with people who are sick.  Put distance between yourself and other people. ? Remember that some people without symptoms may be able to spread virus. ? This is especially important for people who are at higher risk of getting very GainPain.com.cy Cover your mouth and nose with a mask when around others  You could spread COVID-19 to others even if you do not feel sick.  Everyone should wear a mask in public settings and when around people not living in their household, especially when social distancing is difficult to maintain. ? Masks should not be placed on young children under age 92, anyone who has trouble breathing, or is unconscious, incapacitated or otherwise unable to remove the mask without assistance.  The mask is meant to protect other people in case you are infected.  Do NOT use a facemask meant for a Dietitian.  Continue to keep about 6 feet between yourself and others. The mask is not a substitute for social distancing. Cover coughs and sneezes  Always cover your mouth and nose with a tissue when you cough or sneeze or use the inside of your elbow.  Throw used tissues in the trash.  Immediately wash your hands with soap and water for at least 20 seconds. If soap and water are not readily available, clean your hands with a hand sanitizer that contains at least 60% alcohol. Clean and disinfect  Clean AND disinfect  frequently touched surfaces daily. This includes tables, doorknobs, light switches, countertops, handles, desks, phones, keyboards, toilets, faucets, and sinks. RackRewards.fr  If surfaces are dirty, clean them: Use detergent or soap and water prior to disinfection.  Then, use a household disinfectant. You can see a list of EPA-registered household disinfectants here. [email or web address] 10/15/2018 This information is not intended to replace advice given to you by  your health care provider. Make sure you discuss any questions you have with your health care provider. Document Revised: 10/23/2018 Document Reviewed: 08/21/2018 Elsevier Patient Education  [place].   COVID-19 Frequently Asked Questions COVID-19 (coronavirus disease) is an infection that is caused by a large family of viruses. Some viruses cause illness in people and others cause illness in animals like camels, cats, and bats. In some cases, the viruses that cause illness in animals can spread to humans. Where did the coronavirus come from? In December 2019, [place] told the [facility]) of several cases of lung disease (human respiratory illness). These cases were linked to an open seafood and livestock market in the city of [place]. The link to the seafood and livestock market suggests that the virus may have spread from animals to humans. However, since that first outbreak in December, the virus has also been shown to spread from person to person. What is the name of the disease and the virus? Disease name Early on, this disease was called novel coronavirus. This is because scientists determined that the disease was caused by a new (novel) respiratory virus. The World Health Organization [facility]) has now named the disease COVID-19, or coronavirus disease. Virus name The virus that causes the disease is called severe acute respiratory syndrome  coronavirus 2 (SARS-CoV-2). More information on disease and virus naming World Health Organization [facility]): www.who.int/emergencies/diseases/novel-coronavirus-2019/technical-guidance/naming-the-coronavirus-disease-(covid-2019)-and-the-virus-that-causes-it Who is at risk for complications from coronavirus disease? Some people may be at higher risk for complications from coronavirus disease. This includes older adults and people who have chronic diseases, such as heart disease, diabetes, and lung disease. If you are at higher risk for complications, take these extra precautions:  Stay home as much as possible.  Avoid social gatherings and travel.  Avoid close contact with others. Stay at least 6 ft (2 m) away from others, if possible.  Wash your hands often with soap and water for at least 20 seconds.  Avoid touching your face, mouth, nose, or eyes.  Keep supplies on hand at home, such as food, medicine, and cleaning supplies.  If you must go out in public, wear a cloth face covering or face mask. Make sure your mask covers your nose and mouth. How does coronavirus disease spread? The virus that causes coronavirus disease spreads easily from person to person (is contagious). You may catch the virus by:  Breathing in droplets from an infected person. Droplets can be spread by a person breathing, speaking, singing, coughing, or sneezing.  Touching something, like a table or a doorknob, that was exposed to the virus (contaminated) and then touching your mouth, nose, or eyes. Can I get the virus from touching surfaces or objects? There is still a lot that we do not know about the virus that causes coronavirus disease. Scientists are basing a lot of information on what they know about similar viruses, such as:  Viruses cannot generally survive on surfaces for long. They need a human body (host) to survive.  It is more likely that the virus is spread by close contact with people who are sick  (direct contact), such as through: ? Shaking hands or hugging. ? Breathing in respiratory droplets that travel through the air. Droplets can be spread by a person breathing, speaking, singing, coughing, or sneezing.  It is less likely that the virus is spread when a person touches a surface or object that has the virus on it (indirect contact). The virus may be able to enter  the body if the person touches a surface or object and then touches his or her face, eyes, nose, or mouth. Can a person spread the virus without having symptoms of the disease? It may be possible for the virus to spread before a person has symptoms of the disease, but this is most likely not the main way the virus is spreading. It is more likely for the virus to spread by being in close contact with people who are sick and breathing in the respiratory droplets spread by a person breathing, speaking, singing, coughing, or sneezing. What are the symptoms of coronavirus disease? Symptoms vary from person to person and can range from mild to severe. Symptoms may include:  Fever or chills.  Cough.  Difficulty breathing or feeling short of breath.  Headaches, body aches, or muscle aches.  Runny or stuffy (congested) nose.  Sore throat.  New loss of taste or smell.  Nausea, vomiting, or diarrhea. These symptoms can appear anywhere from 2 to 14 days after you have been exposed to the virus. Some people may not have any symptoms. If you develop symptoms, call your health care provider. People with severe symptoms may need hospital care. Should I be tested for this virus? Your health care provider will decide whether to test you based on your symptoms, history of exposure, and your risk factors. How does a health care provider test for this virus? Health care providers will collect samples to send for testing. Samples may include:  Taking a swab of fluid from the back of your nose and throat, your nose, or your  throat.  Taking fluid from the lungs by having you cough up mucus (sputum) into a sterile cup.  Taking a blood sample. Is there a treatment or vaccine for this virus? Currently, there is no vaccine to prevent coronavirus disease. Also, there are no medicines like antibiotics or antivirals to treat the virus. A person who becomes sick is given supportive care, which means rest and fluids. A person may also relieve his or her symptoms by using over-the-counter medicines that treat sneezing, coughing, and runny nose. These are the same medicines that a person takes for the common cold. If you develop symptoms, call your health care provider. People with severe symptoms may need hospital care. What can I do to protect myself and my family from this virus?     You can protect yourself and your family by taking the same actions that you would take to prevent the spread of other viruses. Take the following actions:  Wash your hands often with soap and water for at least 20 seconds. If soap and water are not available, use alcohol-based hand sanitizer.  Avoid touching your face, mouth, nose, or eyes.  Cough or sneeze into a tissue, sleeve, or elbow. Do not cough or sneeze into your hand or the air. ? If you cough or sneeze into a tissue, throw it away immediately and wash your hands.  Disinfect objects and surfaces that you frequently touch every day.  Stay away from people who are sick.  Avoid going out in public, follow guidance from your state and local health authorities.  Avoid crowded indoor spaces. Stay at least 6 ft (2 m) away from others.  If you must go out in public, wear a cloth face covering or face mask. Make sure your mask covers your nose and mouth.  Stay home if you are sick, except to get medical care. Call your health care provider  before you get medical care. Your health care provider will tell you how long to stay home.  Make sure your vaccines are up to date. Ask your  health care provider what vaccines you need. What should I do if I need to travel? Follow travel recommendations from your local health authority, the CDC, and WHO. Travel information and advice  Centers for Disease Control and Prevention (CDC): BodyEditor.hu  World Health Organization [facility]): ThirdIncome.ca Know the risks and take action to protect your health  You are at higher risk of getting coronavirus disease if you are traveling to areas with an outbreak or if you are exposed to travelers from areas with an outbreak.  Wash your hands often and practice good hygiene to lower the risk of catching or spreading the virus. What should I do if I am sick? General instructions to stop the spread of infection  Wash your hands often with soap and water for at least 20 seconds. If soap and water are not available, use alcohol-based hand sanitizer.  Cough or sneeze into a tissue, sleeve, or elbow. Do not cough or sneeze into your hand or the air.  If you cough or sneeze into a tissue, throw it away immediately and wash your hands.  Stay home unless you must get medical care. Call your health care provider or local health authority before you get medical care.  Avoid public areas. Do not take public transportation, if possible.  If you can, wear a mask if you must go out of the house or if you are in close contact with someone who is not sick. Make sure your mask covers your nose and mouth. Keep your home clean  Disinfect objects and surfaces that are frequently touched every day. This may include: ? Counters and tables. ? Doorknobs and light switches. ? Sinks and faucets. ? Electronics such as phones, remote controls, keyboards, computers, and tablets.  Wash dishes in hot, soapy water or use a dishwasher. Air-dry your dishes.  Wash laundry in hot water. Prevent infecting other household  members  Let healthy household members care for children and pets, if possible. If you have to care for children or pets, wash your hands often and wear a mask.  Sleep in a different bedroom or bed, if possible.  Do not share personal items, such as razors, toothbrushes, deodorant, combs, brushes, towels, and washcloths. Where to find more information Centers for Disease Control and Prevention (CDC)  Information and news updates: https://www.butler-gonzalez.com/ World Health Organization [facility])  Information and news updates: MissExecutive.com.ee  Coronavirus health topic: https://www.castaneda.info/  Questions and answers on COVID-19: OpportunityDebt.at  Global tracker: who.sprinklr.com American Academy of Pediatrics (AAP)  Information for families: www.healthychildren.org/English/health-issues/conditions/chest-lungs/Pages/2019-Novel-Coronavirus.aspx The coronavirus situation is changing rapidly. Check your local health authority website or the CDC and [facility] websites for updates and news. When should I contact a health care provider?  Contact your health care provider if you have symptoms of an infection, such as fever or cough, and you: ? Have been near anyone who is known to have coronavirus disease. ? Have come into contact with a person who is suspected to have coronavirus disease. ? Have traveled to an area where there is an outbreak of COVID-19. When should I get emergency medical care?  Get help right away by calling your local emergency services (911 in the [place] if you have: ? Trouble breathing. ? Pain or pressure in your chest. ? Confusion. ? Blue-tinged lips and fingernails. ? Difficulty waking from sleep. ?  Symptoms that get worse. Let the emergency medical personnel know if you think you have coronavirus disease. Summary  A new respiratory virus is spreading from person to person and  causing COVID-19 (coronavirus disease).  The virus that causes COVID-19 appears to spread easily. It spreads from one person to another through droplets from breathing, speaking, singing, coughing, or sneezing.  Older adults and those with chronic diseases are at higher risk of disease. If you are at higher risk for complications, take extra precautions.  There is currently no vaccine to prevent coronavirus disease. There are no medicines, such as antibiotics or antivirals, to treat the virus.  You can protect yourself and your family by washing your hands often, avoiding touching your face, and covering your coughs and sneezes. This information is not intended to replace advice given to you by your health care provider. Make sure you discuss any questions you have with your health care provider. Document Revised: 11/28/2018 Document Reviewed: 05/27/2018 Elsevier Patient Education  [place]: Quarantine vs. Isolation QUARANTINE keeps someone who was in close contact with someone who has COVID-19 away from others. If you had close contact with a person who has COVID-19  Stay home until 14 days after your last contact.  Check your temperature twice a day and watch for symptoms of COVID-19.  If possible, stay away from people who are at higher-risk for getting very sick from COVID-19. ISOLATION keeps someone who is sick or tested positive for COVID-19 without symptoms away from others, even in their own home. If you are sick and think or know you have COVID-19  Stay home until after ? At least 10 days since symptoms first appeared and ? At least 24 hours with no fever without fever-reducing medication and ? Symptoms have improved If you tested positive for COVID-19 but do not have symptoms  Stay home until after ? 10 days have passed since your positive test If you live with others, stay in a specific "sick room" or area and away from other people or animals,  including pets. Use a separate bathroom, if available. [email or web address] 09/01/2018 This information is not intended to replace advice given to you by your health care provider. Make sure you discuss any questions you have with your health care provider. Document Revised: 01/15/2019 Document Reviewed: 01/15/2019 Elsevier Patient Education  [place] Can Do to Manage Your COVID-19 Symptoms at Home If you have possible or confirmed COVID-19: 1. Stay home from work and school. And stay away from other public places. If you must go out, avoid using any kind of public transportation, ridesharing, or taxis. 2. Monitor your symptoms carefully. If your symptoms get worse, call your healthcare provider immediately. 3. Get rest and stay hydrated. 4. If you have a medical appointment, call the healthcare provider ahead of time and tell them that you have or may have COVID-19. 5. For medical emergencies, call 911 and notify the dispatch personnel that you have or may have COVID-19. 6. Cover your cough and sneezes with a tissue or use the inside of your elbow. 7. Wash your hands often with soap and water for at least 20 seconds or clean your hands with an alcohol-based hand sanitizer that contains at least 60% alcohol. 8. As much as possible, stay in a specific room and away from other people in your home. Also, you should use a separate bathroom, if available. If you need to be around other people in  or outside of the home, wear a mask. 9. Avoid sharing personal items with other people in your household, like dishes, towels, and bedding. 10. Clean all surfaces that are touched often, like counters, tabletops, and doorknobs. Use household cleaning sprays or wipes according to the label instructions. [email or web address] 08/13/2018 This information is not intended to replace advice given to you by your health care provider. Make sure you discuss any questions you have with your  health care provider. Document Revised: 01/15/2019 Document Reviewed: 01/15/2019 Elsevier Patient Education  [place].

## 2019-06-27 LAB — CULTURE, BLOOD (ROUTINE X 2)
Culture: NO GROWTH
Culture: NO GROWTH
Special Requests: ADEQUATE

## 2019-06-27 NOTE — Discharge Summary (Signed)
[facility] [place] NAME: Erik Allen    MR#:  [id number]  DATE OF BIRTH:  23-Aug-1955  DATE OF ADMISSION:  06/22/2019   ADMITTING PHYSICIAN: Ivor Costa, MD  DATE OF DISCHARGE: 06/26/2019  1:11 PM  PRIMARY CARE PHYSICIAN: Ria Bush, MD   ADMISSION DIAGNOSIS:  SOB (shortness of breath) [R06.02] Hypoxia [R09.02] Acute hypoxemic respiratory failure due to COVID-19 ([place]) [U07.1, J96.01] COVID-19 virus infection [U07.1] COVID-19 [U07.1] DISCHARGE DIAGNOSIS:  Principal Problem:   Acute hypoxemic respiratory failure due to COVID-19 [facility]) Active Problems:   Hyperlipidemia associated with type 2 diabetes mellitus ([place])   Essential hypertension   Asthma   GERD   Benign prostatic hyperplasia   Controlled type 2 diabetes mellitus without complication, without long-term current use of insulin (HCC)   COVID-19   SOB (shortness of breath)   Hypoxia  SECONDARY DIAGNOSIS:   Past Medical History:  Diagnosis Date  . Aortic stenosis 06/21/2014   Echo 01/2018: Mild LVH, normal sys fxn EF 55-60%, G1DD, moderate AS with mildly dilated LA. rec yearly echocardiogram  . Asthma 09/13/99  . BENIGN PROSTATIC HYPERTROPHY 05/14/2003  . CAP (community acquired pneumonia) 01/31/2015  . Diabetes mellitus without complication ([place])   . Disorder of vocal cord 2012   h/o thrush on vocal cords per patient  . GERD (gastroesophageal reflux disease) 10/13/01   ENT eval for GERD and thrush 2011  . History of kidney stones   . HTN (hypertension)   . Hyperlipemia 10/13/97  . S/P minimally invasive aortic valve replacement with bioprosthetic valve 04/29/2019   [facility] Inspiris Resilia stented bovine pericardial tissue valve (size 23 mm)  . Seasonal allergic rhinitis    worse in spring  . Systolic murmur   . Transaminitis 2014   presumed fatty liver without [place], normal iron and viral hep panel   HOSPITAL COURSE:   Acute hypoxemic respiratory failure: secondary to COVID-19.  CXR is stable withoutobvious obvious infiltration,  - Weaned off to room air -CT chest neg for PE  COVID19 infection: Completed IV remdesivir course, steroids, continue vitamin c and zinc at D/C. Encourage incentive spirometry & flutter valve. - weaned off to room air  Transaminitis: likely secondary to [place].   Hypokalemia: repleted  Hyperlipidemia: continue on statin   HTN/sinus tachycardia: continue on home dose irbesartan, HCTZ.  -Continue metoprolol 100 mg p.o. twice a day for better control heart rate -CT angio chest neg for PE  Asthma: w/o exacerbation. Continue on singulair and bronchodilators   GERD: continue on protonix  Benign prostatic hyperplasia: continue on finasteride   DM2: controlled. Most recent A1c6.2. Continue to hold home dose of metformin.   DISCHARGE CONDITIONS:  stable CONSULTS OBTAINED:   DRUG ALLERGIES:   Allergies  Allergen Reactions  . Ace Inhibitors Cough  . Codeine Other (See Comments)    REACTION: DIZZY / HEADACHE  . Tramadol Nausea And Vomiting   DISCHARGE MEDICATIONS:   Allergies as of 06/26/2019      Reactions   Ace Inhibitors Cough   Codeine Other (See Comments)   REACTION: DIZZY / HEADACHE   Tramadol Nausea And Vomiting      Medication List    STOP taking these medications   metoprolol succinate 50 MG 24 hr tablet Commonly known as: TOPROL-XL     TAKE these medications   albuterol (2.5 MG/3ML) 0.083% nebulizer solution Commonly known as: PROVENTIL USE 1 VIAL VIA NEBULIZER EVERY 6 HOURS AS NEEDED What changed:   how  much to take  how to take this  when to take this  reasons to take this  additional instructions   albuterol 108 (90 Base) MCG/ACT inhaler Commonly known as: VENTOLIN HFA INHALE 2 PUFFS INTO THE LUNGS EVERY 6 HOURS AS NEEDED FOR WHEEZING OR SHORTNESS OF BREATH What changed: Another medication with the same name was changed. Make sure you understand how and when to take each.     ascorbic acid 500 MG tablet Commonly known as: VITAMIN C Take 1 tablet (500 mg total) by mouth daily.   aspirin 325 MG EC tablet Take 1 tablet (325 mg total) by mouth daily.   finasteride 5 MG tablet Commonly known as: PROSCAR TAKE 1 TABLET BY MOUTH EVERY DAY   Fluticasone-Salmeterol 250-50 MCG/DOSE Aepb Commonly known as: Advair Diskus INHALE 1 PUFF BY MOUTH TWICE DAILY. RINSE MOUTH AFTER USE   metFORMIN 500 MG tablet Commonly known as: GLUCOPHAGE TAKE 1 TABLET (500 MG TOTAL) BY MOUTH 2 (TWO) TIMES DAILY WITH A MEAL.   metoprolol tartrate 100 MG tablet Commonly known as: LOPRESSOR Take 1 tablet (100 mg total) by mouth 2 (two) times daily. What changed:   medication strength  how much to take   montelukast 10 MG tablet Commonly known as: SINGULAIR TAKE 1 TABLET BY MOUTH EVERY NIGHT AT BEDTIME   pantoprazole 40 MG tablet Commonly known as: PROTONIX Take 40 mg by mouth at bedtime.   QC Vitamin D3 10 MCG (400 UNIT) Tabs tablet Generic drug: cholecalciferol Take 400 Units by mouth daily.   simvastatin 80 MG tablet Commonly known as: ZOCOR Take 80 mg by mouth at bedtime.   valsartan-hydrochlorothiazide 160-25 MG tablet Commonly known as: DIOVAN-HCT Take 1 tablet by mouth daily.   vitamin B-12 500 MCG tablet Commonly known as: V-R VITAMIN B-12 Take 1 tablet (500 mcg total) by mouth daily.   zinc sulfate 220 (50 Zn) MG capsule Take 1 capsule (220 mg total) by mouth daily.      DISCHARGE INSTRUCTIONS:   DIET:  Cardiac diet DISCHARGE CONDITION:  Stable ACTIVITY:  Activity as tolerated OXYGEN:  Home Oxygen: No.  Oxygen Delivery: room air DISCHARGE LOCATION:  home   If you experience worsening of your admission symptoms, develop shortness of breath, life threatening emergency, suicidal or homicidal thoughts you must seek medical attention immediately by calling 911 or calling your MD immediately  if symptoms less severe.  You Must read complete  instructions/literature along with all the possible adverse reactions/side effects for all the Medicines you take and that have been prescribed to you. Take any new Medicines after you have completely understood and accpet all the possible adverse reactions/side effects.   Please note  You were cared for by a hospitalist during your hospital stay. If you have any questions about your discharge medications or the care you received while you were in the hospital after you are discharged, you can call the unit and asked to speak with the hospitalist on call if the hospitalist that took care of you is not available. Once you are discharged, your primary care physician will handle any further medical issues. Please note that NO REFILLS for any discharge medications will be authorized once you are discharged, as it is imperative that you return to your primary care physician (or establish a relationship with a primary care physician if you do not have one) for your aftercare needs so that they can reassess your need for medications and monitor your lab values.  On the day of Discharge:  VITAL SIGNS:  Blood pressure (!) 155/79, pulse 90, temperature 97.9 F (36.6 C), resp. rate 20, height 5\' 10"  (1.778 m), weight 99.8 kg, SpO2 91 %. PHYSICAL EXAMINATION:  GENERAL:  64 y.o.-year-old patient lying in the bed with no acute distress.  EYES: Pupils equal, round, reactive to light and accommodation. No scleral icterus. Extraocular muscles intact.  HEENT: Head atraumatic, normocephalic. Oropharynx and nasopharynx clear.  NECK:  Supple, no jugular venous distention. No thyroid enlargement, no tenderness.  LUNGS: Normal breath sounds bilaterally, no wheezing, rales,rhonchi or crepitation. No use of accessory muscles of respiration.  CARDIOVASCULAR: S1, S2 normal. No murmurs, rubs, or gallops.  ABDOMEN: Soft, non-tender, non-distended. Bowel sounds present. No organomegaly or mass.  EXTREMITIES: No pedal edema,  cyanosis, or clubbing.  NEUROLOGIC: Cranial nerves II through XII are intact. Muscle strength 5/5 in all extremities. Sensation intact. Gait not checked.  PSYCHIATRIC: The patient is alert and oriented x 3.  SKIN: No obvious rash, lesion, or ulcer.  DATA REVIEW:   CBC Recent Labs  Lab 06/26/19 0507  WBC 11.9*  HGB 12.8*  HCT 39.2  PLT 283    Chemistries  Recent Labs  Lab 06/23/19 0435 06/23/19 0435 06/24/19 0508 06/24/19 0508 06/25/19 0523  NA 137   < > 135   < > 137  K 3.9   < > 3.6   < > 3.7  CL 101   < > 98   < > 103  CO2 28   < > 27   < > 26  GLUCOSE 148*   < > 150*   < > 149*  BUN 20   < > 24*   < > 25*  CREATININE 0.77   < > 0.75   < > 0.76  CALCIUM 8.9   < > 8.8*   < > 8.7*  MG 2.2  --   --   --   --   AST  --   --  42*  --   --   ALT  --   --  45*  --   --   ALKPHOS  --   --  43  --   --   BILITOT  --   --  0.5  --   --    < > = values in this interval not displayed.     Outpatient follow-up Follow-up Information    Call Ria Bush, MD.   Specialty: Family Medicine Why: Office will call patient with appointment Contact information: [place] [address] [phone]        Kate Sable, MD. Go on 07/06/2019.   Specialties: Cardiology, Radiology Why: at 2:20 p.m. Contact information: [place] [address] [phone]        Ottie Glazier, MD. Go on 07/09/2019.   Specialty: Pulmonary Disease Why: at 11:15 a.m. Contact information: [place] [address] [phone]            Management plans discussed with the patient, family and they are in agreement.  CODE STATUS: Prior   TOTAL TIME TAKING CARE OF THIS PATIENT: 45 minutes.    Max Sane M.D on 06/27/2019 at 8:27 PM  [facility]   CC: Primary care physician; Ria Bush, MD   Note: This dictation was prepared with Dragon dictation along with smaller phrase technology. Any  transcriptional errors that result from this process are unintentional.

## 2019-06-28 ENCOUNTER — Other Ambulatory Visit: Payer: Self-pay | Admitting: Physician Assistant

## 2019-06-29 ENCOUNTER — Encounter: Payer: Self-pay | Admitting: Family Medicine

## 2019-06-29 ENCOUNTER — Telehealth: Payer: Self-pay | Admitting: Family Medicine

## 2019-06-29 NOTE — Telephone Encounter (Addendum)
Spoke with asking for update.  States he is doing great.  He was d/c 06/26/19 and was told to quarantine for 10 days.  Says, oddly enough, his breathing is much better after tx than it was before COVID.  Expresses his thanks for the call.  FYI to Dr. Darnell Level.

## 2019-06-29 NOTE — Telephone Encounter (Signed)
Pt hospitalized last week with COVID plz get first date of symptoms.  plz call for update on symptoms. Would offer appt for eval - virtual vs in office- depending whether he's completed 21d self-isolation period (given he was hospitalized).

## 2019-07-01 ENCOUNTER — Encounter: Payer: Self-pay | Admitting: *Deleted

## 2019-07-01 DIAGNOSIS — Z952 Presence of prosthetic heart valve: Secondary | ICD-10-CM

## 2019-07-01 NOTE — Progress Notes (Signed)
Cardiac Individual Treatment Plan  Patient Details  Name: Erik Allen MRN: [id number] Date of Birth: Dec 15, 1955 Referring Provider:     Cardiac Rehab from 05/26/2019 in [facility] Cardiac and Pulmonary Rehab  Referring Provider  [name] MD      Initial Encounter Date:    Cardiac Rehab from 05/26/2019 in [facility] Cardiac and Pulmonary Rehab  Date  05/26/19      Visit Diagnosis: S/P aortic valve replacement  Patient's Home Medications on Admission:  Current Outpatient Medications:  .  albuterol (PROVENTIL) (2.5 MG/3ML) 0.083% nebulizer solution, USE 1 VIAL VIA NEBULIZER EVERY 6 HOURS AS NEEDED (Patient taking differently: Take 2.5 mg by nebulization every 6 (six) hours as needed for wheezing or shortness of breath. USE 1 VIAL VIA NEBULIZER EVERY 6 HOURS AS NEEDE), Disp: 150 mL, Rfl: 3 .  albuterol (VENTOLIN HFA) 108 (90 Base) MCG/ACT inhaler, INHALE 2 PUFFS INTO THE LUNGS EVERY 6 HOURS AS NEEDED FOR WHEEZING OR SHORTNESS OF BREATH, Disp: 18 g, Rfl: 4 .  ascorbic acid (VITAMIN C) 500 MG tablet, Take 1 tablet (500 mg total) by mouth daily., Disp: 30 tablet, Rfl: 0 .  aspirin EC 325 MG EC tablet, Take 1 tablet (325 mg total) by mouth daily., Disp: 30 tablet, Rfl: 0 .  cholecalciferol (QC VITAMIN D3) 10 MCG (400 UNIT) TABS tablet, Take 400 Units by mouth daily., Disp: , Rfl:  .  cyanocobalamin (V-R VITAMIN B-12) 500 MCG tablet, Take 1 tablet (500 mcg total) by mouth daily., Disp: , Rfl:  .  finasteride (PROSCAR) 5 MG tablet, TAKE 1 TABLET BY MOUTH EVERY DAY, Disp: 90 tablet, Rfl: 1 .  Fluticasone-Salmeterol (ADVAIR DISKUS) 250-50 MCG/DOSE AEPB, INHALE 1 PUFF BY MOUTH TWICE DAILY. RINSE MOUTH AFTER USE, Disp: 180 each, Rfl: 1 .  metFORMIN (GLUCOPHAGE) 500 MG tablet, TAKE 1 TABLET (500 MG TOTAL) BY MOUTH 2 (TWO) TIMES DAILY WITH A MEAL., Disp: 180 tablet, Rfl: 0 .  metoprolol tartrate (LOPRESSOR) 100 MG tablet, Take 1 tablet (100 mg total) by mouth 2 (two) times daily., Disp: 30 tablet, Rfl:  0 .  montelukast (SINGULAIR) 10 MG tablet, TAKE 1 TABLET BY MOUTH EVERY NIGHT AT BEDTIME (Patient taking differently: Take 10 mg by mouth at bedtime. ), Disp: 90 tablet, Rfl: 2 .  pantoprazole (PROTONIX) 40 MG tablet, Take 40 mg by mouth at bedtime., Disp: , Rfl:  .  simvastatin (ZOCOR) 80 MG tablet, Take 80 mg by mouth at bedtime., Disp: , Rfl:  .  valsartan-hydrochlorothiazide (DIOVAN-HCT) 160-25 MG tablet, Take 1 tablet by mouth daily., Disp: 90 tablet, Rfl: 1 .  zinc sulfate 220 (50 Zn) MG capsule, Take 1 capsule (220 mg total) by mouth daily., Disp: 30 capsule, Rfl: 0  Past Medical History: Past Medical History:  Diagnosis Date  . Aortic stenosis 06/21/2014   Echo 01/2018: Mild LVH, normal sys fxn EF 55-60%, G1DD, moderate AS with mildly dilated LA. rec yearly echocardiogram  . Asthma 09/13/99  . BENIGN PROSTATIC HYPERTROPHY 05/14/2003  . CAP (community acquired pneumonia) 01/31/2015  . COVID-19 virus infection 06/2019  . Diabetes mellitus without complication ([place])   . Disorder of vocal cord 2012   h/o thrush on vocal cords per patient  . GERD (gastroesophageal reflux disease) 10/13/01   ENT eval for GERD and thrush 2011  . History of kidney stones   . HTN (hypertension)   . Hyperlipemia 10/13/97  . S/P minimally invasive aortic valve replacement with bioprosthetic valve 04/29/2019   [facility] Inspiris Resilia  stented bovine pericardial tissue valve (size 23 mm)  . Seasonal allergic rhinitis    worse in spring  . Systolic murmur   . Transaminitis 2014   presumed fatty liver without [place], normal iron and viral hep panel    Tobacco Use: Social History   Tobacco Use  Smoking Status Never Smoker  Smokeless Tobacco Never Used    Labs: Recent Review Flowsheet Data    Labs for ITP Cardiac and Pulmonary Rehab Latest Ref Rng & Units 04/30/2019 04/30/2019 04/30/2019 05/01/2019 06/22/2019   Cholestrol 0 - 200 mg/dL - - - - -   LDLCALC 0 - 99 mg/dL - - - - -   LDLDIRECT mg/dL - - - - -    HDL >39.00 mg/dL - - - - -   Trlycerides <150 mg/dL - - - - 81   Hemoglobin A1c 4.8 - 5.6 % - - - - -   PHART 7.350 - 7.450 7.306(L) 7.328(L) - - -   PCO2ART 32.0 - 48.0 mmHg 43.1 40.0 - - -   HCO3 20.0 - 28.0 mmol/L 21.5 20.9 - - -   TCO2 22 - 32 mmol/L 23 22 - - -   ACIDBASEDEF 0.0 - 2.0 mmol/L 4.0(H) 5.0(H) - - -   O2SAT % 93.0 91.0 59.5 65.0 -       Exercise Target Goals: Exercise Program Goal: Individual exercise prescription set using results from initial 6 min walk test and THRR while considering  patient's activity barriers and safety.   Exercise Prescription Goal: Initial exercise prescription builds to 30-45 minutes a day of aerobic activity, 2-3 days per week.  Home exercise guidelines will be given to patient during program as part of exercise prescription that the participant will acknowledge.   Education: Aerobic Exercise & Resistance Training: - Gives group verbal and written instruction on the various components of exercise. Focuses on aerobic and resistive training programs and the benefits of this training and how to safely progress through these programs..   Education: Exercise & Equipment Safety: - Individual verbal instruction and demonstration of equipment use and safety with use of the equipment.   Cardiac Rehab from 05/26/2019 in [facility] Cardiac and Pulmonary Rehab  Date  05/26/19  Educator  [facility]  Instruction Review Code  1- Verbalizes Understanding      Education: Exercise Physiology & General Exercise Guidelines: - Group verbal and written instruction with models to review the exercise physiology of the cardiovascular system and associated critical values. Provides general exercise guidelines with specific guidelines to those with heart or lung disease.    Education: Flexibility, Balance, Mind/Body Relaxation: Provides group verbal/written instruction on the benefits of flexibility and balance training, including mind/body exercise modes such as yoga,  pilates and tai chi.  Demonstration and skill practice provided.   Activity Barriers & Risk Stratification: Activity Barriers & Cardiac Risk Stratification - 05/26/19 1526      Activity Barriers & Cardiac Risk Stratification   Activity Barriers  Deconditioning;Balance Concerns;Muscular Weakness;Joint Problems    Cardiac Risk Stratification  Low       6 Minute Walk: 6 Minute Walk    Row Name 05/26/19 1525         6 Minute Walk   Phase  Initial     Distance  1005 feet     Walk Time  6 minutes     # of Rest Breaks  0     MPH  1.9     METS  2.65  RPE  7     VO2 Peak  9.26     Symptoms  Yes (comment)     Comments  hip pain 3/10     Resting HR  96 bpm     Resting BP  130/74     Resting Oxygen Saturation   97 %     Exercise Oxygen Saturation  during 6 min walk  96 %     Max Ex. HR  106 bpm     Max Ex. BP  136/64     2 Minute Post BP  126/74        Oxygen Initial Assessment:   Oxygen Re-Evaluation:   Oxygen Discharge (Final Oxygen Re-Evaluation):   Initial Exercise Prescription: Initial Exercise Prescription - 05/26/19 1500      Date of Initial Exercise RX and Referring Provider   Date  05/26/19    Referring Provider  [name] MD      Treadmill   MPH  1.9    Grade  1    Minutes  15    METs  2.72      Elliptical   Level  1    Speed  2.1    Minutes  15      REL-XR   Level  2    Speed  50    Minutes  15    METs  2.7      Prescription Details   Frequency (times per week)  2    Duration  Progress to 30 minutes of continuous aerobic without signs/symptoms of physical distress      Intensity   THRR 40-80% of Max Heartrate  121-146    Ratings of Perceived Exertion  11-13    Perceived Dyspnea  0-4      Progression   Progression  Continue to progress workloads to maintain intensity without signs/symptoms of physical distress.      Resistance Training   Training Prescription  Yes    Weight  3 lb    Reps  10-15       Perform Capillary  Blood Glucose checks as needed.  Exercise Prescription Changes: Exercise Prescription Changes    Row Name 05/26/19 1500 06/02/19 1400 06/16/19 1500         Response to Exercise   Blood Pressure (Admit)  130/74  128/66  130/74     Blood Pressure (Exercise)  136/64  158/56  142/70     Blood Pressure (Exit)  126/74  104/66  122/74     Heart Rate (Admit)  96 bpm  127 bpm  109 bpm     Heart Rate (Exercise)  106 bpm  163 bpm  130 bpm     Heart Rate (Exit)  98 bpm  139 bpm  115 bpm     Oxygen Saturation (Admit)  97 %  --  --     Oxygen Saturation (Exercise)  96 %  --  --     Rating of Perceived Exertion (Exercise)  '7  15  13     '$ Symptoms  hip pain 3/10  --  none     Comments  walk test results  second day  difficulty on elliptical     Duration  --  --  Continue with 30 min of aerobic exercise without signs/symptoms of physical distress.     Intensity  --  --  THRR unchanged       Progression   Progression  --  --  Continue to progress workloads to maintain intensity without signs/symptoms of physical distress.     Average METs  --  --  2.72       Resistance Training   Training Prescription  --  Yes  Yes     Weight  --  3 lb  4 lb     Reps  --  10-15  10-15       Interval Training   Interval Training  --  No  No       Treadmill   MPH  --  1.9  1.9     Grade  --  1  1     Minutes  --  15  15     METs  --  2.72  2.72       Elliptical   Level  --  1  1     Minutes  --  15 HR high at 12 min - back to TM  12        Exercise Comments: Exercise Comments    Row Name 05/28/19 0818           Exercise Comments  First full day of exercise!  Patient was oriented to gym and equipment including functions, settings, policies, and procedures.  Patient's individual exercise prescription and treatment plan were reviewed.  All starting workloads were established based on the results of the 6 minute walk test done at initial orientation visit.  The plan for exercise progression was also  introduced and progression will be customized based on patient's performance and goals.          Exercise Goals and Review: Exercise Goals    Row Name 05/26/19 1528             Exercise Goals   Increase Physical Activity  Yes       Intervention  Provide advice, education, support and counseling about physical activity/exercise needs.;Develop an individualized exercise prescription for aerobic and resistive training based on initial evaluation findings, risk stratification, comorbidities and participant's personal goals.       Expected Outcomes  Short Term: Attend rehab on a regular basis to increase amount of physical activity.;Long Term: Add in home exercise to make exercise part of routine and to increase amount of physical activity.;Long Term: Exercising regularly at least 3-5 days a week.       Increase Strength and Stamina  Yes       Intervention  Provide advice, education, support and counseling about physical activity/exercise needs.;Develop an individualized exercise prescription for aerobic and resistive training based on initial evaluation findings, risk stratification, comorbidities and participant's personal goals.       Expected Outcomes  Short Term: Increase workloads from initial exercise prescription for resistance, speed, and METs.;Short Term: Perform resistance training exercises routinely during rehab and add in resistance training at home;Long Term: Improve cardiorespiratory fitness, muscular endurance and strength as measured by increased METs and functional capacity (6MWT)       Able to understand and use rate of perceived exertion (RPE) scale  Yes       Intervention  Provide education and explanation on how to use RPE scale       Expected Outcomes  Short Term: Able to use RPE daily in rehab to express subjective intensity level;Long Term:  Able to use RPE to guide intensity level when exercising independently       Able to understand and use Dyspnea scale  Yes  Intervention  Provide education and explanation on how to use Dyspnea scale       Expected Outcomes  Short Term: Able to use Dyspnea scale daily in rehab to express subjective sense of shortness of breath during exertion;Long Term: Able to use Dyspnea scale to guide intensity level when exercising independently       Knowledge and understanding of Target Heart Rate Range (THRR)  Yes       Intervention  Provide education and explanation of THRR including how the numbers were predicted and where they are located for reference       Expected Outcomes  Short Term: Able to state/look up THRR;Short Term: Able to use daily as guideline for intensity in rehab;Long Term: Able to use THRR to govern intensity when exercising independently       Able to check pulse independently  Yes       Intervention  Provide education and demonstration on how to check pulse in carotid and radial arteries.;Review the importance of being able to check your own pulse for safety during independent exercise       Expected Outcomes  Short Term: Able to explain why pulse checking is important during independent exercise;Long Term: Able to check pulse independently and accurately       Understanding of Exercise Prescription  Yes       Intervention  Provide education, explanation, and written materials on patient's individual exercise prescription       Expected Outcomes  Short Term: Able to explain program exercise prescription;Long Term: Able to explain home exercise prescription to exercise independently          Exercise Goals Re-Evaluation : Exercise Goals Re-Evaluation    Row Name 05/28/19 0819 06/02/19 1510 06/16/19 1552         Exercise Goal Re-Evaluation   Exercise Goals Review  Able to understand and use rate of perceived exertion (RPE) scale;Knowledge and understanding of Target Heart Rate Range (THRR);Able to check pulse independently;Understanding of Exercise Prescription  Increase Physical Activity;Increase Strength  and Stamina;Able to understand and use rate of perceived exertion (RPE) scale;Able to understand and use Dyspnea scale;Knowledge and understanding of Target Heart Rate Range (THRR);Able to check pulse independently;Understanding of Exercise Prescription  Increase Physical Activity;Increase Strength and Stamina;Understanding of Exercise Prescription     Comments  Reviewed RPE scale, THR and program prescription with pt today.  Pt voiced understanding and was given a copy of goals to take home.  The elliptical has been challenging for Rondarius but he has been able to add more time on elliptical.  Kendell is doing well in rehab.  He is building up his tolerance on the ellipitcal but is not a fan.  We will move his equipment around some.  He is up to 4 lb weights.  We will continue to monitor his progress.     Expected Outcomes  Short: Use RPE daily to regulate intensity. Long: Follow program prescription in THR.  Short:  build up to 15 min on elliptical Long: increase overall stamina  Short: Try out new equipment  Long: Continue to improve stamina.        Discharge Exercise Prescription (Final Exercise Prescription Changes): Exercise Prescription Changes - 06/16/19 1500      Response to Exercise   Blood Pressure (Admit)  130/74    Blood Pressure (Exercise)  142/70    Blood Pressure (Exit)  122/74    Heart Rate (Admit)  109 bpm    Heart Rate (Exercise)  130 bpm    Heart Rate (Exit)  115 bpm    Rating of Perceived Exertion (Exercise)  13    Symptoms  none    Comments  difficulty on elliptical    Duration  Continue with 30 min of aerobic exercise without signs/symptoms of physical distress.    Intensity  THRR unchanged      Progression   Progression  Continue to progress workloads to maintain intensity without signs/symptoms of physical distress.    Average METs  2.72      Resistance Training   Training Prescription  Yes    Weight  4 lb    Reps  10-15      Interval Training   Interval  Training  No      Treadmill   MPH  1.9    Grade  1    Minutes  15    METs  2.72      Elliptical   Level  1    Minutes  12       Nutrition:  Target Goals: Understanding of nutrition guidelines, daily intake of sodium '1500mg'$ , cholesterol '200mg'$ , calories 30% from fat and 7% or less from saturated fats, daily to have 5 or more servings of fruits and vegetables.  Education: Controlling Sodium/Reading Food Labels -Group verbal and written material supporting the discussion of sodium use in heart healthy nutrition. Review and explanation with models, verbal and written materials for utilization of the food label.   Education: General Nutrition Guidelines/Fats and Fiber: -Group instruction provided by verbal, written material, models and posters to present the general guidelines for heart healthy nutrition. Gives an explanation and review of dietary fats and fiber.   Biometrics: Pre Biometrics - 05/26/19 1529      Pre Biometrics   Height  5' 9.1" (1.755 m)    Weight  226 lb 9.6 oz (102.8 kg)    BMI (Calculated)  33.37    Single Leg Stand  14.19 seconds        Nutrition Therapy Plan and Nutrition Goals:   Nutrition Assessments: Nutrition Assessments - 05/26/19 1530      MEDFICTS Scores   Pre Score  48       MEDIFICTS Score Key:          ?70 Need to make dietary changes          40-70 Heart Healthy Diet         ? 40 Therapeutic Level Cholesterol Diet  Nutrition Goals Re-Evaluation:   Nutrition Goals Discharge (Final Nutrition Goals Re-Evaluation):   Psychosocial: Target Goals: Acknowledge presence or absence of significant depression and/or stress, maximize coping skills, provide positive support system. Participant is able to verbalize types and ability to use techniques and skills needed for reducing stress and depression.   Education: Depression - Provides group verbal and written instruction on the correlation between heart/lung disease and depressed mood,  treatment options, and the stigmas associated with seeking treatment.   Education: Sleep Hygiene -Provides group verbal and written instruction about how sleep can affect your health.  Define sleep hygiene, discuss sleep cycles and impact of sleep habits. Review good sleep hygiene tips.     Education: Stress and Anxiety: - Provides group verbal and written instruction about the health risks of elevated stress and causes of high stress.  Discuss the correlation between heart/lung disease and anxiety and treatment options. Review healthy ways to manage with stress and anxiety.    Initial Review & Psychosocial Screening:  Initial Psych Review & Screening - 05/22/19 1004      Initial Review   Current issues with  None Identified      Family Dynamics   Good Support System?  Yes   Brother lives away, friends and neighbors and church     Barriers   Psychosocial barriers to participate in program  There are no identifiable barriers or psychosocial needs.;The patient should benefit from training in stress management and relaxation.      Screening Interventions   Interventions  Encouraged to exercise    Expected Outcomes  Short Term goal: Utilizing psychosocial counselor, staff and physician to assist with identification of specific Stressors or current issues interfering with healing process. Setting desired goal for each stressor or current issue identified.;Long Term Goal: Stressors or current issues are controlled or eliminated.;Short Term goal: Identification and review with participant of any Quality of Life or Depression concerns found by scoring the questionnaire.;Long Term goal: The participant improves quality of Life and PHQ9 Scores as seen by post scores and/or verbalization of changes       Quality of Life Scores:  Quality of Life - 05/26/19 1530      Quality of Life   Select  Quality of Life      Quality of Life Scores   Health/Function Pre  29.2 %    Socioeconomic Pre   27.75 %    Psych/Spiritual Pre  30 %    Family Pre  30 %    GLOBAL Pre  29.12 %      Scores of 19 and below usually indicate a poorer quality of life in these areas.  A difference of  2-3 points is a clinically meaningful difference.  A difference of 2-3 points in the total score of the Quality of Life Index has been associated with significant improvement in overall quality of life, self-image, physical symptoms, and general health in studies assessing change in quality of life.  PHQ-9: Recent Review Flowsheet Data    Depression screen [facility] 2/9 05/26/2019 07/12/2017   Decreased Interest 0 0   Down, Depressed, Hopeless 0 0   PHQ - 2 Score 0 0   Altered sleeping 2 -   Tired, decreased energy 1 -   Change in appetite 0 -   Feeling bad or failure about yourself  0 -   Trouble concentrating 0 -   Moving slowly or fidgety/restless 0 -   Suicidal thoughts 0 -   PHQ-9 Score 3 -   Difficult doing work/chores Not difficult at all -     Interpretation of Total Score  Total Score Depression Severity:  1-4 = Minimal depression, 5-9 = Mild depression, 10-14 = Moderate depression, 15-19 = Moderately severe depression, 20-27 = Severe depression   Psychosocial Evaluation and Intervention: Psychosocial Evaluation - 05/22/19 1025      Psychosocial Evaluation & Interventions   Interventions  Encouraged to exercise with the program and follow exercise prescription    Comments  Mr Pettie has no barriers to attending the program. He is ready to start the program. He lives alone and has friends, neighbors and his Miles [place]ain as his support system. He has a brother that lives [place]. HIs brother was here when the surgery took place. He should do well with the program.    Expected Outcomes  STG: Attends program consistently, continues to have no barriers to attending the program.  LTG: Maintians the heart healthy lifestyle he learned at the program  Continue Psychosocial Services   Follow up required by staff        Psychosocial Re-Evaluation:   Psychosocial Discharge (Final Psychosocial Re-Evaluation):   Vocational Rehabilitation: Provide vocational rehab assistance to qualifying candidates.   Vocational Rehab Evaluation & Intervention: Vocational Rehab - 05/22/19 1008      Initial Vocational Rehab Evaluation & Intervention   Assessment shows need for Vocational Rehabilitation  No       Education: Education Goals: Education classes will be provided on a variety of topics geared toward better understanding of heart health and risk factor modification. Participant will state understanding/return demonstration of topics presented as noted by education test scores.  Learning Barriers/Preferences: Learning Barriers/Preferences - 05/22/19 1007      Learning Barriers/Preferences   Learning Barriers  None    Learning Preferences  None       General Cardiac Education Topics:  AED/CPR: - Group verbal and written instruction with the use of models to demonstrate the basic use of the AED with the basic ABC's of resuscitation.   Anatomy & Physiology of the Heart: - Group verbal and written instruction and models provide basic cardiac anatomy and physiology, with the coronary electrical and arterial systems. Review of Valvular disease and Heart Failure   Cardiac Procedures: - Group verbal and written instruction to review commonly prescribed medications for heart disease. Reviews the medication, class of the drug, and side effects. Includes the steps to properly store meds and maintain the prescription regimen. (beta blockers and nitrates)   Cardiac Medications I: - Group verbal and written instruction to review commonly prescribed medications for heart disease. Reviews the medication, class of the drug, and side effects. Includes the steps to properly store meds and maintain the prescription regimen.   Cardiac Medications II: -Group verbal and written instruction to review commonly  prescribed medications for heart disease. Reviews the medication, class of the drug, and side effects. (all other drug classes)    Go Sex-Intimacy & Heart Disease, Get SMART - Goal Setting: - Group verbal and written instruction through game format to discuss heart disease and the return to sexual intimacy. Provides group verbal and written material to discuss and apply goal setting through the application of the S.M.A.R.T. Method.   Other Matters of the Heart: - Provides group verbal, written materials and models to describe Stable Angina and Peripheral Artery. Includes description of the disease process and treatment options available to the cardiac patient.   Infection Prevention: - Provides verbal and written material to individual with discussion of infection control including proper hand washing and proper equipment cleaning during exercise session.   Cardiac Rehab from 05/26/2019 in [facility] Cardiac and Pulmonary Rehab  Date  05/26/19  Educator  [facility]  Instruction Review Code  1- Verbalizes Understanding      Falls Prevention: - Provides verbal and written material to individual with discussion of falls prevention and safety.   Cardiac Rehab from 05/26/2019 in [facility] Cardiac and Pulmonary Rehab  Date  05/26/19  Educator  [facility]  Instruction Review Code  1- Verbalizes Understanding      Other: -Provides group and verbal instruction on various topics (see comments)   Knowledge Questionnaire Score: Knowledge Questionnaire Score - 05/26/19 1530      Knowledge Questionnaire Score   Pre Score  23/26 Education Focus: Nutrition and Exercise       Core Components/Risk Factors/Patient Goals at Admission: Personal Goals and Risk Factors at Admission - 05/26/19 1531      Core  Components/Risk Factors/Patient Goals on Admission    Weight Management  Yes;Weight Loss;Obesity    Intervention  Weight Management: Develop a combined nutrition and exercise program designed to reach desired caloric  intake, while maintaining appropriate intake of nutrient and fiber, sodium and fats, and appropriate energy expenditure required for the weight goal.;Weight Management: Provide education and appropriate resources to help participant work on and attain dietary goals.;Weight Management/Obesity: Establish reasonable short term and long term weight goals.;Obesity: Provide education and appropriate resources to help participant work on and attain dietary goals.    Admit Weight  226 lb (102.5 kg)    Goal Weight: Short Term  220 lb (99.8 kg)    Goal Weight: Long Term  216 lb (98 kg)    Expected Outcomes  Short Term: Continue to assess and modify interventions until short term weight is achieved;Long Term: Adherence to nutrition and physical activity/exercise program aimed toward attainment of established weight goal;Weight Loss: Understanding of general recommendations for a balanced deficit meal plan, which promotes 1-2 lb weight loss per week and includes a negative energy balance of [phone] kcal/d;Understanding recommendations for meals to include 15-35% energy as protein, 25-35% energy from fat, 35-60% energy from carbohydrates, less than '200mg'$  of dietary cholesterol, 20-35 gm of total fiber daily;Understanding of distribution of calorie intake throughout the day with the consumption of 4-5 meals/snacks    Diabetes  Yes    Intervention  Provide education about signs/symptoms and action to take for hypo/hyperglycemia.;Provide education about proper nutrition, including hydration, and aerobic/resistive exercise prescription along with prescribed medications to achieve blood glucose in normal ranges: Fasting glucose 65-99 mg/dL    Expected Outcomes  Short Term: Participant verbalizes understanding of the signs/symptoms and immediate care of hyper/hypoglycemia, proper foot care and importance of medication, aerobic/resistive exercise and nutrition plan for blood glucose control.;Long Term: Attainment of HbA1C < 7%.     Hypertension  Yes    Intervention  Provide education on lifestyle modifcations including regular physical activity/exercise, weight management, moderate sodium restriction and increased consumption of fresh fruit, vegetables, and low fat dairy, alcohol moderation, and smoking cessation.;Monitor prescription use compliance.    Expected Outcomes  Short Term: Continued assessment and intervention until BP is < 140/61m HG in hypertensive participants. < 130/879mHG in hypertensive participants with diabetes, heart failure or chronic kidney disease.;Long Term: Maintenance of blood pressure at goal levels.    Lipids  Yes    Intervention  Provide education and support for participant on nutrition & aerobic/resistive exercise along with prescribed medications to achieve LDL '70mg'$ , HDL >'40mg'$ .    Expected Outcomes  Short Term: Participant states understanding of desired cholesterol values and is compliant with medications prescribed. Participant is following exercise prescription and nutrition guidelines.;Long Term: Cholesterol controlled with medications as prescribed, with individualized exercise RX and with personalized nutrition plan. Value goals: LDL < '70mg'$ , HDL > 40 mg.       Education:Diabetes - Individual verbal and written instruction to review signs/symptoms of diabetes, desired ranges of glucose level fasting, after meals and with exercise. Acknowledge that pre and post exercise glucose checks will be done for 3 sessions at entry of program.   [facility]  Date  05/26/19  [facility]  1- Verbalizes Understanding      Education: Know Your Numbers and Risk Factors: -Group verbal and written instruction about important numbers in your health.  Discussion of what are risk factors and  how they play a role in the disease process.  Review of Cholesterol, Blood Pressure, Diabetes, and BMI and the role they play in your  overall health.   Core Components/Risk Factors/Patient Goals Review:    Core Components/Risk Factors/Patient Goals at Discharge (Final Review):    ITP Comments: ITP Comments    Row Name 05/22/19 1022 05/26/19 1524 05/28/19 0818 06/03/19 1049 06/22/19 1307   ITP Comments  Completed Virtual Orientation call.  Has appt.on 4/13 with EP for eval and gym orientation.  Diagnosis documentation can be found in [facility] 04/29/2019  Completed 6MWT and gym orientation.  Initial ITP created and sent for review to Dr. Emily Filbert, Medical Director.  First full day of exercise!  Patient was oriented to gym and equipment including functions, settings, policies, and procedures.  Patient's individual exercise prescription and treatment plan were reviewed.  All starting workloads were established based on the results of the 6 minute walk test done at initial orientation visit.  The plan for exercise progression was also introduced and progression will be customized based on patient's performance and goals.  30 day chart review completed. ITP sent to Dr Zachery Dakins Medical Director, for review,changes as needed and signature. Continue with ITP if no changes requested  Jayse called to let us know that he has been admitted for COVID-19 as of last night.  He will be out until 6/1.   Row Name 06/30/19 1345 07/01/19 0634         ITP Comments  Teon has not attended since 06/18/19 due to Covid-19.  30 Day review completed. ITP review done, changes made as directed,and approval shown by signature of  Scientist, research (life sciences).         Comments:

## 2019-07-03 ENCOUNTER — Telehealth: Payer: BC Managed Care – PPO | Admitting: Family Medicine

## 2019-07-03 ENCOUNTER — Telehealth (INDEPENDENT_AMBULATORY_CARE_PROVIDER_SITE_OTHER): Payer: BC Managed Care – PPO | Admitting: Family Medicine

## 2019-07-03 ENCOUNTER — Encounter: Payer: Self-pay | Admitting: Family Medicine

## 2019-07-03 VITALS — BP 132/79 | HR 82 | Temp 98.0°F | Ht 70.0 in | Wt 215.0 lb

## 2019-07-03 DIAGNOSIS — U071 COVID-19: Secondary | ICD-10-CM | POA: Diagnosis not present

## 2019-07-03 DIAGNOSIS — J4520 Mild intermittent asthma, uncomplicated: Secondary | ICD-10-CM | POA: Diagnosis not present

## 2019-07-03 DIAGNOSIS — J9601 Acute respiratory failure with hypoxia: Secondary | ICD-10-CM | POA: Diagnosis not present

## 2019-07-03 NOTE — Assessment & Plan Note (Signed)
This has resolved.

## 2019-07-03 NOTE — Progress Notes (Signed)
Virtual visit completed through [facility]. Due to national recommendations of social distancing due to COVID-19, a virtual visit is felt to be most appropriate for this patient at this time. Reviewed limitations of a virtual visit.   Patient location: home Provider location: [place] at [facility], office If any vitals were documented, they were collected by patient at home unless specified below.    BP 132/79   Pulse 82   Temp 98 F (36.7 C)   Ht 5\' 10"  (1.778 m)   Wt 215 lb (97.5 kg)   SpO2 95%   BMI 30.85 kg/m    CC: hosp f/u visit  Subjective:    Patient ID: Erik Allen, male    DOB: 06/09/1955, 64 y.o.   MRN: [id number]  HPI: Erik Allen is a 64 y.o. male presenting on 07/03/2019 for Hospitalization Follow-up    S/p minimally invasive aortic valve replacement with bioprosthetic valve 04/2019. Known asthmatic on singulair, advair and PRN albuterol. Known controlled diabetic on metformin.  Recent hospitalization for COVID-19, records reviewed. Admitted with acute hypoxic respiratory failure due to Covid-19 infection. CT chest negative for pneumonia or PE. He received IV remdesivir along with steroids, vitamin C and zinc. Was able to be weaned off supplemental oxygen prior to discharge. Found to have transaminitis - presumed due to [place].   First day of symptoms was 06/20/2019. Started with fever and dyspnea. Mild cough. No body aches, loss of taste or smell, headache, chest pain, significant diarrhea or nausea. Currently in 14d quarantine period to end on Sunday. Maintaining oxygen 95-97% on RA. Was advised to wait 4 wks prior to receiving COVID vaccine.    Date of admission: 06/22/2019 Date of discharge: 06/26/2019 TCM hospital f/u phone call completed on 06/29/2019.   Discharge diagnosis: Principal Problem:   Acute hypoxemic respiratory failure due to COVID-19 [facility]) Active Problems:   Hyperlipidemia associated with type 2 diabetes mellitus ([place])   Essential  hypertension   Asthma   GERD   Benign prostatic hyperplasia   Controlled type 2 diabetes mellitus without complication, without long-term current use of insulin (HCC)   COVID-19   SOB (shortness of breath)   Hypoxia     Relevant past medical, surgical, family and social history reviewed and updated as indicated. Interim medical history since our last visit reviewed. Allergies and medications reviewed and updated. Outpatient Medications Prior to Visit  Medication Sig Dispense Refill  . albuterol (PROVENTIL) (2.5 MG/3ML) 0.083% nebulizer solution USE 1 VIAL VIA NEBULIZER EVERY 6 HOURS AS NEEDED (Patient taking differently: Take 2.5 mg by nebulization every 6 (six) hours as needed for wheezing or shortness of breath. USE 1 VIAL VIA NEBULIZER EVERY 6 HOURS AS NEEDE) 150 mL 3  . albuterol (VENTOLIN HFA) 108 (90 Base) MCG/ACT inhaler INHALE 2 PUFFS INTO THE LUNGS EVERY 6 HOURS AS NEEDED FOR WHEEZING OR SHORTNESS OF BREATH 18 g 4  . ascorbic acid (VITAMIN C) 500 MG tablet Take 1 tablet (500 mg total) by mouth daily. 30 tablet 0  . aspirin EC 325 MG EC tablet Take 1 tablet (325 mg total) by mouth daily. 30 tablet 0  . cholecalciferol (QC VITAMIN D3) 10 MCG (400 UNIT) TABS tablet Take 400 Units by mouth daily.    . cyanocobalamin (V-R VITAMIN B-12) 500 MCG tablet Take 1 tablet (500 mcg total) by mouth daily.    . finasteride (PROSCAR) 5 MG tablet TAKE 1 TABLET BY MOUTH EVERY DAY 90 tablet 1  . Fluticasone-Salmeterol (ADVAIR  DISKUS) 250-50 MCG/DOSE AEPB INHALE 1 PUFF BY MOUTH TWICE DAILY. RINSE MOUTH AFTER USE 180 each 1  . metFORMIN (GLUCOPHAGE) 500 MG tablet TAKE 1 TABLET (500 MG TOTAL) BY MOUTH 2 (TWO) TIMES DAILY WITH A MEAL. 180 tablet 0  . metoprolol tartrate (LOPRESSOR) 100 MG tablet Take 1 tablet (100 mg total) by mouth 2 (two) times daily. 30 tablet 0  . montelukast (SINGULAIR) 10 MG tablet TAKE 1 TABLET BY MOUTH EVERY NIGHT AT BEDTIME (Patient taking differently: Take 10 mg by mouth at  bedtime. ) 90 tablet 2  . pantoprazole (PROTONIX) 40 MG tablet Take 40 mg by mouth at bedtime.    . simvastatin (ZOCOR) 80 MG tablet Take 80 mg by mouth at bedtime.    . valsartan-hydrochlorothiazide (DIOVAN-HCT) 160-25 MG tablet Take 1 tablet by mouth daily. 90 tablet 1  . zinc sulfate 220 (50 Zn) MG capsule Take 1 capsule (220 mg total) by mouth daily. 30 capsule 0   No facility-administered medications prior to visit.     Per HPI unless specifically indicated in ROS section below Review of Systems Objective:  BP 132/79   Pulse 82   Temp 98 F (36.7 C)   Ht 5\' 10"  (1.778 m)   Wt 215 lb (97.5 kg)   SpO2 95%   BMI 30.85 kg/m   Wt Readings from Last 3 Encounters:  07/03/19 215 lb (97.5 kg)  06/21/19 220 lb (99.8 kg)  06/11/19 225 lb 7 oz (102.3 kg)       Physical exam: Gen: alert, NAD, not ill appearing Pulm: speaks in complete sentences without increased work of breathing Psych: normal mood, normal thought content      Lab Results  Component Value Date   ALT 45 (H) 06/24/2019   AST 42 (H) 06/24/2019   ALKPHOS 43 06/24/2019   BILITOT 0.5 06/24/2019    Lab Results  Component Value Date   HGBA1C 6.2 (H) 04/27/2019    Lab Results  Component Value Date   CREATININE 0.76 06/25/2019   BUN 25 (H) 06/25/2019   NA 137 06/25/2019   K 3.7 06/25/2019   CL 103 06/25/2019   CO2 26 06/25/2019    Assessment & Plan:   Problem List Items Addressed This Visit    COVID-19 virus infection - Primary    Recovering remarkably well after COVID infection s/p decadron and remdesivir IV in hospital. Reviewed anticipated course of recovery. He actually feels like he's breathing better than prior. Discussed vit C and zinc dosing. Hopefully will not have sequelae from COVID infection.       Asthma    Stable period after covid infection.      RESOLVED: Acute hypoxemic respiratory failure due to COVID-19 [facility])    This has resolved.           No orders of the defined types were  placed in this encounter.  No orders of the defined types were placed in this encounter.   I discussed the assessment and treatment plan with the patient. The patient was provided an opportunity to ask questions and all were answered. The patient agreed with the plan and demonstrated an understanding of the instructions. The patient was advised to call back or seek an in-person evaluation if the symptoms worsen or if the condition fails to improve as anticipated.  Follow up plan: No follow-ups on file.  Ria Bush, MD

## 2019-07-03 NOTE — Assessment & Plan Note (Addendum)
Recovering remarkably well after COVID infection s/p decadron and remdesivir IV in hospital. Reviewed anticipated course of recovery. He actually feels like he's breathing better than prior. Discussed vit C and zinc dosing. Hopefully will not have sequelae from COVID infection.

## 2019-07-03 NOTE — Assessment & Plan Note (Signed)
Stable period after covid infection.

## 2019-07-06 ENCOUNTER — Other Ambulatory Visit: Payer: Self-pay

## 2019-07-06 ENCOUNTER — Ambulatory Visit (INDEPENDENT_AMBULATORY_CARE_PROVIDER_SITE_OTHER): Payer: BC Managed Care – PPO | Admitting: Cardiology

## 2019-07-06 ENCOUNTER — Encounter: Payer: Self-pay | Admitting: Cardiology

## 2019-07-06 VITALS — BP 124/74 | HR 91 | Ht 70.0 in | Wt 211.0 lb

## 2019-07-06 DIAGNOSIS — I10 Essential (primary) hypertension: Secondary | ICD-10-CM

## 2019-07-06 DIAGNOSIS — E7800 Pure hypercholesterolemia, unspecified: Secondary | ICD-10-CM | POA: Diagnosis not present

## 2019-07-06 DIAGNOSIS — Z953 Presence of xenogenic heart valve: Secondary | ICD-10-CM

## 2019-07-06 NOTE — Progress Notes (Signed)
Cardiology Office Note:    Date:  07/06/2019   ID:  Erik, Allen 1955/05/01, MRN [id number]  PCP:  Ria Bush, MD  Cardiologist:  Kate Sable, MD  Electrophysiologist:  None   Referring MD: Ria Bush, MD   Chief Complaint  Patient presents with  . other    hospital follow up. meds reviewed verbally with patient.      History of Present Illness:    Erik Allen is a 64 y.o. male with a hx of aortic stenosis status post aortic valve replacement on 04/2019, hypertension, hyperlipidemia who presents for follow-up.  Patient was last seen after his aortic valve replacement with a bioprosthetic valve.  Postoperative course has been well.  Echocardiogram was ordered to establish aortic valve baseline function post surgery.  He was not able to obtain echocardiogram as he was admitted.  Patient admitted on 06/22/2019 to the hospital for 4 days due to COVID-19 infection and respiratory failure.  He complains of shortness of breath, fever, chills and cough at the time.  He completed IV remdesivir course and steroids.  Discharged in stable condition.  He feels well, has no concerns at this time.   Historical notes Patient was last seen due to aortic stenosis.  Echocardiogram did reveal severe aortic valve calcification with severe aortic stenosis.  He was referred to cardiac surgery for evaluation and management.  He underwent a minimally invasive aortic valve replacement with a bioprosthetic valve on 04/29/2019.  Postoperative course has been uneventful.    Past Medical History:  Diagnosis Date  . Aortic stenosis 06/21/2014   Echo 01/2018: Mild LVH, normal sys fxn EF 55-60%, G1DD, moderate AS with mildly dilated LA. rec yearly echocardiogram  . Asthma 09/13/99  . BENIGN PROSTATIC HYPERTROPHY 05/14/2003  . CAP (community acquired pneumonia) 01/31/2015  . COVID-19 virus infection 06/2019  . Diabetes mellitus without complication ([place])   . Disorder of vocal cord 2012    h/o thrush on vocal cords per patient  . GERD (gastroesophageal reflux disease) 10/13/01   ENT eval for GERD and thrush 2011  . History of kidney stones   . HTN (hypertension)   . Hyperlipemia 10/13/97  . S/P minimally invasive aortic valve replacement with bioprosthetic valve 04/29/2019   Edwards Inspiris Resilia stented bovine pericardial tissue valve (size 23 mm)  . Seasonal allergic rhinitis    worse in spring  . Systolic murmur   . Transaminitis 2014   presumed fatty liver without [place], normal iron and viral hep panel    Past Surgical History:  Procedure Laterality Date  . A1 pulley release  10/2010   stensosing tenosynovitis, R milddle, ring, little fingers  . AORTIC VALVE REPLACEMENT N/A 04/29/2019   Procedure: MINIMALLY INVASIVE AORTIC VALVE REPLACEMENT (AVR) USING INSPIRIS VALVE SIZE 23MM;  Surgeon: Rexene Alberts, MD;  Location: [facility];  Service: Open Heart Surgery;  Laterality: N/A;  . COLONOSCOPY  2010   WNL, rpt due 10 yrs  . CYSTECTOMY     from back  . CYSTOSCOPY  08/02   Stone retrieval  . EAR CYST EXCISION Left 08/02/2016   Procedure: EXCISION MUCOID CYST LEFT MIDDLE FINGER WITH DISTAL INTERPHALANGEAL JOINT [place];  Surgeon: Daryll Brod, MD;  Location: [facility];  Service: Orthopedics;  Laterality: Left;  . FOOT CAPSULE RELEASE W/ PERCUTANEOUS HEEL CORD LENGTHENING, TIBIAL TENDON TRANSFER  04/09   bilateral, Dr. Milinda Pointer  . INGUINAL HERNIA REPAIR  06/28/09   left, with mesh, Dr. Bary Castilla  .  RIGHT/LEFT HEART CATH AND CORONARY ANGIOGRAPHY Bilateral 04/06/2019   Procedure: RIGHT/LEFT HEART CATH AND CORONARY ANGIOGRAPHY;  Surgeon: Wellington Hampshire, MD;  Location: [facility];  Service: Cardiovascular;  Laterality: Bilateral;  . ROTATION FLAP Left 08/02/2016   Procedure: ROTATION DORSAL FLAP;  Surgeon: Daryll Brod, MD;  Location: [facility];  Service: Orthopedics;  Laterality: Left;  . TEE WITHOUT CARDIOVERSION N/A 04/29/2019    Procedure: TRANSESOPHAGEAL ECHOCARDIOGRAM (TEE);  Surgeon: Rexene Alberts, MD;  Location: [facility];  Service: Open Heart Surgery;  Laterality: N/A;  . US ECHOCARDIOGRAPHY  09/01   TR, MR    Current Medications: Current Meds  Medication Sig  . albuterol (PROVENTIL) (2.5 MG/3ML) 0.083% nebulizer solution USE 1 VIAL VIA NEBULIZER EVERY 6 HOURS AS NEEDED (Patient taking differently: Take 2.5 mg by nebulization every 6 (six) hours as needed for wheezing or shortness of breath. USE 1 VIAL VIA NEBULIZER EVERY 6 HOURS AS NEEDE)  . albuterol (VENTOLIN HFA) 108 (90 Base) MCG/ACT inhaler INHALE 2 PUFFS INTO THE LUNGS EVERY 6 HOURS AS NEEDED FOR WHEEZING OR SHORTNESS OF BREATH  . ascorbic acid (VITAMIN C) 500 MG tablet Take 1 tablet (500 mg total) by mouth daily.  Marland Kitchen aspirin EC 325 MG EC tablet Take 1 tablet (325 mg total) by mouth daily.  . cholecalciferol (QC VITAMIN D3) 10 MCG (400 UNIT) TABS tablet Take 400 Units by mouth daily.  . cyanocobalamin (V-R VITAMIN B-12) 500 MCG tablet Take 1 tablet (500 mcg total) by mouth daily.  . finasteride (PROSCAR) 5 MG tablet TAKE 1 TABLET BY MOUTH EVERY DAY  . Fluticasone-Salmeterol (ADVAIR DISKUS) 250-50 MCG/DOSE AEPB INHALE 1 PUFF BY MOUTH TWICE DAILY. RINSE MOUTH AFTER USE  . metFORMIN (GLUCOPHAGE) 500 MG tablet TAKE 1 TABLET (500 MG TOTAL) BY MOUTH 2 (TWO) TIMES DAILY WITH A MEAL.  . metoprolol tartrate (LOPRESSOR) 100 MG tablet Take 1 tablet (100 mg total) by mouth 2 (two) times daily.  . montelukast (SINGULAIR) 10 MG tablet TAKE 1 TABLET BY MOUTH EVERY NIGHT AT BEDTIME (Patient taking differently: Take 10 mg by mouth at bedtime. )  . pantoprazole (PROTONIX) 40 MG tablet Take 40 mg by mouth at bedtime.  . simvastatin (ZOCOR) 80 MG tablet Take 80 mg by mouth at bedtime.  . valsartan-hydrochlorothiazide (DIOVAN-HCT) 160-25 MG tablet Take 1 tablet by mouth daily.  Marland Kitchen zinc sulfate 220 (50 Zn) MG capsule Take 1 capsule (220 mg total) by mouth daily.     Allergies:    Ace inhibitors, Codeine, and Tramadol   Social History   Socioeconomic History  . Marital status: Single    Spouse name: Not on file  . Number of children: Not on file  . Years of education: Not on file  . Highest education level: Not on file  Occupational History  . Occupation: TEAM LEADER    Employer: [employer]    Comment: Sheet metal fabrication  Tobacco Use  . Smoking status: Never Smoker  . Smokeless tobacco: Never Used  Substance and Sexual Activity  . Alcohol use: Yes    Alcohol/week: 1.0 - 2.0 standard drinks    Types: 1 - 2 Standard drinks or equivalent per week    Comment: occassionally  . Drug use: No  . Sexual activity: Yes  Other Topics Concern  . 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 Difficulty of Paying Living Expenses:   Food Insecurity:   . Worried About Charity fundraiser in the Last Year:   . Arboriculturist in the Last Year:   Transportation Needs:   . Film/video editor (Medical):   Marland Kitchen Lack of Transportation (Non-Medical):   Physical Activity:   . Days of Exercise per Week:   . Minutes of Exercise per Session:   Stress:   . Feeling of Stress :   Social Connections:   . Frequency of Communication with Friends and Family:   . Frequency of Social Gatherings with Friends and Family:   . Attends Religious Services:   . Active Member of Clubs or Organizations:   . Attends Archivist Meetings:   Marland Kitchen Marital Status:      Family History: The patient's family history includes Cancer (age of onset: 16) in his father; Hypertension in his brother, father, and mother.  ROS:   Please see the history of present illness.       All other systems reviewed and are negative.  EKGs/Labs/Other Studies Reviewed:    The following studies were reviewed today:   EKG:  EKG is  ordered today.  The ekg ordered today demonstrates normal sinus rhythm  Recent Labs: 06/22/2019: B Natriuretic Peptide 59.0 06/23/2019: Magnesium 2.2 06/24/2019: ALT 45 06/25/2019: BUN 25; Creatinine, Ser 0.76; Potassium 3.7; Sodium 137 06/26/2019: Hemoglobin 12.8; Platelets 283  Recent Lipid Panel    Component Value Date/Time   CHOL 158 08/14/2018 1152   TRIG 81 06/22/2019 0812   HDL 55.10 08/14/2018 1152   CHOLHDL 3 08/14/2018 1152   VLDL 30.2 08/14/2018 1152   LDLCALC 72 08/14/2018 1152   LDLDIRECT 94.0 07/09/2017 0739    Physical Exam:    VS:  BP 124/74 (BP Location: Left Arm, Patient Position: Sitting, Cuff Size: Normal)   Pulse 91   Ht 5\' 10"  (1.778 m)   Wt 211 lb (95.7 kg)   SpO2 98%   BMI 30.28 kg/m     Wt Readings from Last 3 Encounters:  07/06/19 211 lb (95.7 kg)  07/03/19 215 lb (97.5 kg)  06/21/19 220 lb (99.8 kg)     GEN:  Well nourished, well developed in no acute distress HEENT: Normal NECK: No JVD; No carotid bruits LYMPHATICS: No lymphadenopathy CARDIAC: RRR, 1/6 systolic murmur, rusb, no gallops RESPIRATORY:  Clear to auscultation without rales, wheezing or rhonchi  ABDOMEN: Soft, non-tender, non-distended MUSCULOSKELETAL:  No edema; No deformity  SKIN: Warm and dry NEUROLOGIC:  Alert and oriented x 3 PSYCHIATRIC:  Normal affect   ASSESSMENT:    1. S/P minimally invasive aortic valve replacement with bioprosthetic valve   2. Essential hypertension   3. Pure hypercholesterolemia    PLAN:    In order of problems listed above:  1. Patient with history of severe aortic stenosis status post aortic valve replacement with a bioprosthetic valve on 04/2019, minimally invasive approach.  Postop course has been unremarkable.  Plan for echocardiogram as previously scheduled to establish baseline valvular  function after surgery. 2. Blood pressure well controlled.  Continue current BP meds 3. History of hyperlipidemia, continue statin as prescribed.  Follow-up in 6 months   Medication Adjustments/Labs and Tests Ordered: Current medicines are reviewed at length with  the patient today.  Concerns regarding medicines are outlined above.  Orders Placed This Encounter  Procedures  . EKG 12-Lead   No orders of the defined types were placed in this encounter.   Patient Instructions  Medication Instructions:  No Changes *If you need a refill on your cardiac medications before your next appointment, please call your pharmacy*   Lab Work: None Ordered If you have labs (blood work) drawn today and your tests are completely normal, you will receive your results only by: Marland Kitchen MyChart Message (if you have MyChart) OR . A paper copy in the mail If you have any lab test that is abnormal or we need to change your treatment, we will call you to review the results.   Testing/Procedures:  You have an echocardiogram scheduled on 08/20/2019  Your physician has requested that you have an echocardiogram. Echocardiography is a painless test that uses sound waves to create images of your heart. It provides your doctor with information about the size and shape of your heart and how well your heart's chambers and valves are working. This procedure takes approximately one hour. There are no restrictions for this procedure.    Follow-Up: At [facility], you and your health needs are our priority.  As part of our continuing mission to provide you with exceptional heart care, we have created designated Provider Care Teams.  These Care Teams include your primary Cardiologist (physician) and Advanced Practice Providers (APPs -  Physician Assistants and Nurse Practitioners) who all work together to provide you with the care you need, when you need it.  We recommend signing up for the patient portal called  "MyChart".  Sign up information is provided on this After Visit Summary.  MyChart is used to connect with patients for Virtual Visits (Telemedicine).  Patients are able to view lab/test results, encounter notes, upcoming appointments, etc.  Non-urgent messages can be sent to your provider as well.   To learn more about what you can do with MyChart, go to [email or web address].    Your next appointment:   6 month(s)   The format for your next appointment:   In Person  Provider:   Kate Sable, MD   Other Instructions  N/A    Signed, Kate Sable, MD  07/06/2019 4:27 PM    [place]

## 2019-07-06 NOTE — Patient Instructions (Addendum)
Medication Instructions:  No Changes *If you need a refill on your cardiac medications before your next appointment, please call your pharmacy*   Lab Work: None Ordered If you have labs (blood work) drawn today and your tests are completely normal, you will receive your results only by: Marland Kitchen MyChart Message (if you have MyChart) OR . A paper copy in the mail If you have any lab test that is abnormal or we need to change your treatment, we will call you to review the results.   Testing/Procedures:  You have an echocardiogram scheduled on 08/20/2019  Your physician has requested that you have an echocardiogram. Echocardiography is a painless test that uses sound waves to create images of your heart. It provides your doctor with information about the size and shape of your heart and how well your heart's chambers and valves are working. This procedure takes approximately one hour. There are no restrictions for this procedure.    Follow-Up: At [facility], you and your health needs are our priority.  As part of our continuing mission to provide you with exceptional heart care, we have created designated Provider Care Teams.  These Care Teams include your primary Cardiologist (physician) and Advanced Practice Providers (APPs -  Physician Assistants and Nurse Practitioners) who all work together to provide you with the care you need, when you need it.  We recommend signing up for the patient portal called "MyChart".  Sign up information is provided on this After Visit Summary.  MyChart is used to connect with patients for Virtual Visits (Telemedicine).  Patients are able to view lab/test results, encounter notes, upcoming appointments, etc.  Non-urgent messages can be sent to your provider as well.   To learn more about what you can do with MyChart, go to [email or web address].    Your next appointment:   6 month(s)   The format for your next appointment:   In Person  Provider:   Kate Sable, MD   Other Instructions  N/A

## 2019-07-12 ENCOUNTER — Other Ambulatory Visit: Payer: Self-pay | Admitting: Family Medicine

## 2019-07-14 ENCOUNTER — Other Ambulatory Visit: Payer: Self-pay

## 2019-07-14 ENCOUNTER — Encounter: Payer: BC Managed Care – PPO | Attending: Cardiovascular Disease | Admitting: *Deleted

## 2019-07-14 DIAGNOSIS — Z79899 Other long term (current) drug therapy: Secondary | ICD-10-CM | POA: Diagnosis not present

## 2019-07-14 DIAGNOSIS — Z953 Presence of xenogenic heart valve: Secondary | ICD-10-CM | POA: Insufficient documentation

## 2019-07-14 DIAGNOSIS — N400 Enlarged prostate without lower urinary tract symptoms: Secondary | ICD-10-CM | POA: Diagnosis not present

## 2019-07-14 DIAGNOSIS — Z87442 Personal history of urinary calculi: Secondary | ICD-10-CM | POA: Diagnosis not present

## 2019-07-14 DIAGNOSIS — E785 Hyperlipidemia, unspecified: Secondary | ICD-10-CM | POA: Diagnosis not present

## 2019-07-14 DIAGNOSIS — Z7982 Long term (current) use of aspirin: Secondary | ICD-10-CM | POA: Diagnosis not present

## 2019-07-14 DIAGNOSIS — Z7984 Long term (current) use of oral hypoglycemic drugs: Secondary | ICD-10-CM | POA: Insufficient documentation

## 2019-07-14 DIAGNOSIS — Z7951 Long term (current) use of inhaled steroids: Secondary | ICD-10-CM | POA: Diagnosis not present

## 2019-07-14 DIAGNOSIS — J45909 Unspecified asthma, uncomplicated: Secondary | ICD-10-CM | POA: Insufficient documentation

## 2019-07-14 DIAGNOSIS — E119 Type 2 diabetes mellitus without complications: Secondary | ICD-10-CM | POA: Diagnosis not present

## 2019-07-14 DIAGNOSIS — K219 Gastro-esophageal reflux disease without esophagitis: Secondary | ICD-10-CM | POA: Insufficient documentation

## 2019-07-14 DIAGNOSIS — I10 Essential (primary) hypertension: Secondary | ICD-10-CM | POA: Diagnosis not present

## 2019-07-14 DIAGNOSIS — Z952 Presence of prosthetic heart valve: Secondary | ICD-10-CM

## 2019-07-14 LAB — HM DIABETES EYE EXAM

## 2019-07-14 NOTE — Progress Notes (Signed)
Daily Session Note  Patient Details  Name: Erik Allen MRN: [id number] Date of Birth: 05-Mar-1955 Referring Provider:     Cardiac Rehab from 05/26/2019 in [facility] Card[facility] Rehab  Referring Provider  [name] MD      Encounter Date: 07/14/2019  Check In:      Social History   Tobacco Use  Smoking Status Never Smoker  Smokeless Tobacco Never Used    Goals Met:  Independence with exercise equipment Exercise tolerated well No report of cardiac concerns or symptoms  Goals Unmet:  Not Applicable  Comments: Pt able to follow exercise prescription today without complaint.  Will continue to monitor for progression.   Dr. Emily Filbert is Medical Director for [place] and [facility].

## 2019-07-16 ENCOUNTER — Encounter: Payer: Self-pay | Admitting: Family Medicine

## 2019-07-16 ENCOUNTER — Other Ambulatory Visit: Payer: Self-pay

## 2019-07-16 ENCOUNTER — Encounter: Payer: BC Managed Care – PPO | Admitting: *Deleted

## 2019-07-16 DIAGNOSIS — Z952 Presence of prosthetic heart valve: Secondary | ICD-10-CM

## 2019-07-16 DIAGNOSIS — Z953 Presence of xenogenic heart valve: Secondary | ICD-10-CM | POA: Diagnosis not present

## 2019-07-16 NOTE — Progress Notes (Signed)
Daily Session Note  Patient Details  Name: Erik Allen MRN: [id number] Date of Birth: [birth year] Referring Provider:     Cardiac Rehab from 05/26/2019 in [facility] Cardiac and Pulmonary Rehab  Referring Provider  [name] MD      Encounter Date: 07/16/2019  Check In: Session Check In - 07/16/19 0945      Check-In   Supervising physician immediately available to respond to emergencies  See telemetry face sheet for immediately available ER MD    Location  [facility]-Cardiac & Pulmonary Rehab    Staff Present  Heath Lark, RN, BSN, CCRP;Melissa [place] RDN, Rowe Pavy, BA, ACSM CEP, Exercise Physiologist    Virtual Visit  No    Medication changes reported      No    Fall or balance concerns reported     No    Warm-up and Cool-down  Performed on first and last piece of equipment    Resistance Training Performed  Yes    VAD Patient?  No    PAD/SET Patient?  No      Pain Assessment   Currently in Pain?  No/denies          Social History   Tobacco Use  Smoking Status Never Smoker  Smokeless Tobacco Never Used    Goals Met:  Independence with exercise equipment Exercise tolerated well No report of cardiac concerns or symptoms  Goals Unmet:  Not Applicable  Comments: Pt able to follow exercise prescription today without complaint.  Will continue to monitor for progression.    Dr. Emily Filbert is Medical Director for [place] and [facility].

## 2019-07-20 ENCOUNTER — Encounter: Payer: Self-pay | Admitting: Thoracic Surgery (Cardiothoracic Vascular Surgery)

## 2019-07-20 ENCOUNTER — Other Ambulatory Visit: Payer: Self-pay

## 2019-07-20 ENCOUNTER — Ambulatory Visit (INDEPENDENT_AMBULATORY_CARE_PROVIDER_SITE_OTHER): Payer: Self-pay | Admitting: Thoracic Surgery (Cardiothoracic Vascular Surgery)

## 2019-07-20 VITALS — BP 150/90 | HR 109 | Temp 97.9°F | Resp 20 | Ht 70.0 in | Wt 225.0 lb

## 2019-07-20 DIAGNOSIS — Z953 Presence of xenogenic heart valve: Secondary | ICD-10-CM

## 2019-07-20 DIAGNOSIS — I350 Nonrheumatic aortic (valve) stenosis: Secondary | ICD-10-CM

## 2019-07-20 NOTE — Progress Notes (Signed)
[address]       [place] [address]             [phone]     CARDIOTHORACIC SURGERY OFFICE NOTE  Primary Cardiologist is Kate Sable, MD PCP is Ria Bush, MD   HPI:  Patient is a 64 year old obese white male with history of aortic stenosis, hypertension, hyperlipidemia, insulin-dependent type 2 diabetes mellitus, asthma, and GE reflux disease who returns to the office today for routine follow-up status post aortic valve replacement using a stented bioprosthetic tissue valve via right anterior minithoracotomy on April 29, 2019.  His early postoperative recovery was uneventful and he was discharged from the hospital on the sixth postoperative day.  He was last seen here in our office on May 18, 2019 at which time he was making slow but satisfactory progress.  He continued to do well until early May when he developed COVID-19 infection associated with pneumonia and hypoxemic respiratory failure requiring hospitalization for several days.  Fortunately, he never required intubation for mechanical ventilation and he has recovered without significant complication.  He was seen in follow-up at [facility] by Dr. Garen Lah on Jul 06, 2019 and he returns to our office today for routine follow-up.  He has not yet had a follow-up echocardiogram performed but 1 has been scheduled for early July.  He states that he is still slowly recovering from his COVID-19 infection.  He has not had any problems related to his heart surgery.  He gets fatigued with activity and is still feels weak.  He denies shortness of breath.  His prescription for metoprolol ran out and he did not refill it when he was seen at [facility].   Current Outpatient Medications  Medication Sig Dispense Refill   albuterol (PROVENTIL) (2.5 MG/3ML) 0.083% nebulizer solution USE 1 VIAL VIA NEBULIZER EVERY 6 HOURS AS NEEDED (Patient taking differently: Take 2.5 mg by nebulization every 6 (six)  hours as needed for wheezing or shortness of breath. USE 1 VIAL VIA NEBULIZER EVERY 6 HOURS AS NEEDE) 150 mL 3   albuterol (VENTOLIN HFA) 108 (90 Base) MCG/ACT inhaler INHALE 2 PUFFS INTO THE LUNGS EVERY 6 HOURS AS NEEDED FOR WHEEZING OR SHORTNESS OF BREATH 18 g 4   ascorbic acid (VITAMIN C) 500 MG tablet Take 1 tablet (500 mg total) by mouth daily. 30 tablet 0   aspirin EC 325 MG EC tablet Take 1 tablet (325 mg total) by mouth daily. 30 tablet 0   cholecalciferol (QC VITAMIN D3) 10 MCG (400 UNIT) TABS tablet Take 400 Units by mouth daily.     cyanocobalamin (V-R VITAMIN B-12) 500 MCG tablet Take 1 tablet (500 mcg total) by mouth daily.     finasteride (PROSCAR) 5 MG tablet TAKE 1 TABLET BY MOUTH EVERY DAY 90 tablet 1   Fluticasone-Salmeterol (ADVAIR DISKUS) 250-50 MCG/DOSE AEPB INHALE 1 PUFF BY MOUTH TWICE DAILY. RINSE MOUTH AFTER USE 180 each 1   metFORMIN (GLUCOPHAGE) 500 MG tablet TAKE 1 TABLET (500 MG TOTAL) BY MOUTH 2 (TWO) TIMES DAILY WITH A MEAL. 180 tablet 1   metoprolol tartrate (LOPRESSOR) 100 MG tablet Take 1 tablet (100 mg total) by mouth 2 (two) times daily. 30 tablet 0   montelukast (SINGULAIR) 10 MG tablet TAKE 1 TABLET BY MOUTH EVERY NIGHT AT BEDTIME (Patient taking differently: Take 10 mg by mouth at bedtime. ) 90 tablet 2   pantoprazole (PROTONIX) 40 MG tablet Take 40 mg by mouth at  bedtime.     simvastatin (ZOCOR) 80 MG tablet Take 80 mg by mouth at bedtime.     valsartan-hydrochlorothiazide (DIOVAN-HCT) 160-25 MG tablet Take 1 tablet by mouth daily. 90 tablet 1   zinc sulfate 220 (50 Zn) MG capsule Take 1 capsule (220 mg total) by mouth daily. 30 capsule 0   No current facility-administered medications for this visit.      Physical Exam:   BP (!) 150/90    Pulse (!) 109    Temp 97.9 F (36.6 C) (Temporal)    Resp 20    Ht 5\' 10"  (1.778 m)    Wt 225 lb (102.1 kg)    SpO2 96%    BMI 32.28 kg/m   General:  Well-appearing  Chest:   Clear to  auscultation  CV:   Regular rate and rhythm  Incisions:  Well-healed  Abdomen:  Soft nontender  Extremities:  Warm and well-perfused  Diagnostic Tests:  n/a   Impression:  Patient is doing well nearly 3 months status post aortic valve replacement using a bioprosthetic tissue valve via right anterior minithoracotomy approach.  More recently the patient developed COVID-19 infection with pneumonia, but fortunately he seems to be recovering from that without any significant lasting complication.  However, this is clearly slowed his overall physical recovery from his previous cardiac surgery.  His blood pressure and heart rate are both somewhat elevated today but the patient ran out of his metoprolol.  Plan:  We have not recommended any change the patient's current medications, but I have recommended that the patient refill his prescription for metoprolol at least for the short-term.  I have encouraged the patient to continue to increase his physical activity without any particular limitations.  At this point whether or not he is ready to return to work will be related to his recovery from COVID-19 associated pneumonia and not affected by his previous cardiac surgery.  The patient has been reminded regarding the importance of dental hygiene and the lifelong need for antibiotic prophylaxis for all dental cleanings and other related invasive procedures.  The patient will return to our office for routine follow-up next March, approximately 1 year following his surgery.  He will call and return sooner only should specific problems or questions arise.     Valentina Gu. Roxy Manns, MD 07/20/2019 12:08 PM

## 2019-07-20 NOTE — Patient Instructions (Addendum)
Continue all previous medications without any changes at this time.  Renew your prescription for metoprolol with your cardiologist.  You may resume unrestricted physical activity without any particular limitations at this time.  Endocarditis is a potentially serious infection of heart valves or inside lining of the heart.  It occurs more commonly in patients with diseased heart valves (such as patient's with aortic or mitral valve disease) and in patients who have undergone heart valve repair or replacement.  Certain surgical and dental procedures may put you at risk, such as dental cleaning, other dental procedures, or any surgery involving the respiratory, urinary, gastrointestinal tract, gallbladder or prostate gland.   To minimize your chances for develooping endocarditis, maintain good oral health and seek prompt medical attention for any infections involving the mouth, teeth, gums, skin or urinary tract.    Always notify your doctor or dentist about your underlying heart valve condition before having any invasive procedures. You will need to take antibiotics before certain procedures, including all routine dental cleanings or other dental procedures.  Your cardiologist or dentist should prescribe these antibiotics for you to be taken ahead of time.

## 2019-07-21 ENCOUNTER — Encounter: Payer: BC Managed Care – PPO | Admitting: *Deleted

## 2019-07-21 VITALS — Ht 69.1 in | Wt 226.4 lb

## 2019-07-21 DIAGNOSIS — Z952 Presence of prosthetic heart valve: Secondary | ICD-10-CM

## 2019-07-21 DIAGNOSIS — Z953 Presence of xenogenic heart valve: Secondary | ICD-10-CM | POA: Diagnosis not present

## 2019-07-21 NOTE — Progress Notes (Signed)
Daily Session Note  Patient Details  Name: Erik Allen MRN: [id number] Date of Birth: 1956-02-04 Referring Provider:     Cardiac Rehab from 05/26/2019 in [facility] Card[facility] Rehab  Referring Provider  [name] MD      Encounter Date: 07/21/2019  Check In: Session Check In - 07/21/19 0827      Check-In   Supervising physician immediately available to respond to emergencies  See telemetry face sheet for immediately available ER MD    Location  [facility]-Cardiac & Pulmonary Rehab    Staff Present  Heath Lark, RN, BSN, CCRP;Melissa Caiola RDN, LDN;Joseph Hood RCP,RRT,BSRT    Virtual Visit  No    Medication changes reported      No    Fall or balance concerns reported     No    Warm-up and Cool-down  Performed on first and last piece of equipment    Resistance Training Performed  Yes    VAD Patient?  No    PAD/SET Patient?  No      Pain Assessment   Currently in Pain?  No/denies          Social History   Tobacco Use  Smoking Status Never Smoker  Smokeless Tobacco Never Used    Goals Met:  Independence with exercise equipment Exercise tolerated well Personal goals reviewed No report of cardiac concerns or symptoms Strength training completed today  Goals Unmet:  Not Applicable  Comments: Pt able to follow exercise prescription today without complaint.  Will continue to monitor for progression.  [place] Name 05/26/19 1525 07/21/19 0857       6 Minute Walk   Phase  Initial  Discharge    Distance  1005 feet  1271 feet    Distance % Change  --  26.5 %    Distance Feet Change  --  266 ft    Walk Time  6 minutes  6 minutes    # of Rest Breaks  0  0    MPH  1.9  2.41    METS  2.65  3.25    RPE  7  8    VO2 Peak  9.26  11.39    Symptoms  Yes (comment)  No    Comments  hip pain 3/10  --    Resting HR  96 bpm  105 bpm    Resting BP  130/74  124/70    Resting Oxygen Saturation   97 %  --    Exercise Oxygen Saturation  during 6 min walk   96 %  --    Max Ex. HR  106 bpm  112 bpm    Max Ex. BP  136/64  146/66    2 Minute Post BP  126/74  --       Dr. Emily Filbert is Medical Director for [facility].

## 2019-07-21 NOTE — Patient Instructions (Signed)
Discharge Patient Instructions  Patient Details  Name: Erik Allen MRN: [id number] Date of Birth: [birth year] Referring Provider:  Wellington Hampshire, MD   Number of Visits: 11  Reason for Discharge:  Patient reached a stable level of exercise. Patient independent in their exercise. Early Exit:  Back to work  Smoking History:  Social History   Tobacco Use  Smoking Status Never Smoker  Smokeless Tobacco Never Used    Diagnosis:  S/P aortic valve replacement  Initial Exercise Prescription: Initial Exercise Prescription - 05/26/19 1500      Date of Initial Exercise RX and Referring Provider   Date  05/26/19    Referring Provider  [name] MD      Treadmill   MPH  1.9    Grade  1    Minutes  15    METs  2.72      Elliptical   Level  1    Speed  2.1    Minutes  15      REL-XR   Level  2    Speed  50    Minutes  15    METs  2.7      Prescription Details   Frequency (times per week)  2    Duration  Progress to 30 minutes of continuous aerobic without signs/symptoms of physical distress      Intensity   THRR 40-80% of Max Heartrate  121-146    Ratings of Perceived Exertion  11-13    Perceived Dyspnea  0-4      Progression   Progression  Continue to progress workloads to maintain intensity without signs/symptoms of physical distress.      Resistance Training   Training Prescription  Yes    Weight  3 lb    Reps  10-15       Discharge Exercise Prescription (Final Exercise Prescription Changes): Exercise Prescription Changes - 07/15/19 1400      Response to Exercise   Blood Pressure (Admit)  118/82    Blood Pressure (Exercise)  142/76    Blood Pressure (Exit)  116/68    Heart Rate (Admit)  74 bpm    Heart Rate (Exercise)  110 bpm    Heart Rate (Exit)  89 bpm    Rating of Perceived Exertion (Exercise)  13    Symptoms  none    Duration  Continue with 30 min of aerobic exercise without signs/symptoms of physical distress.    Intensity  THRR  unchanged      Progression   Progression  Continue to progress workloads to maintain intensity without signs/symptoms of physical distress.    Average METs  2.85      Resistance Training   Training Prescription  Yes    Weight  4 lb    Reps  10-15      Interval Training   Interval Training  No      Treadmill   MPH  2.1    Grade  1    Minutes  15    METs  2.91      REL-XR   Level  2    Minutes  15    METs  2.8       Functional Capacity: 6 Minute Walk    [place] Name 05/26/19 1525 07/21/19 0857       6 Minute Walk   Phase  Initial  Discharge    Distance  1005 feet  1271 feet    Distance %  Change  --  26.5 %    Distance Feet Change  --  266 ft    Walk Time  6 minutes  6 minutes    # of Rest Breaks  0  0    MPH  1.9  2.41    METS  2.65  3.25    RPE  7  8    VO2 Peak  9.26  11.39    Symptoms  Yes (comment)  No    Comments  hip pain 3/10  --    Resting HR  96 bpm  105 bpm    Resting BP  130/74  124/70    Resting Oxygen Saturation   97 %  --    Exercise Oxygen Saturation  during 6 min walk  96 %  --    Max Ex. HR  106 bpm  112 bpm    Max Ex. BP  136/64  146/66    2 Minute Post BP  126/74  --       Quality of Life: Quality of Life - 07/21/19 0934      Quality of Life   Select  Quality of Life      Quality of Life Scores   Health/Function Pre  29.2 %    Health/Function Post  29.6 %    Health/Function % Change  1.37 %    Socioeconomic Pre  27.75 %    Socioeconomic Post  30 %    Socioeconomic % Change   8.11 %    Psych/Spiritual Pre  30 %    Psych/Spiritual Post  30 %    Psych/Spiritual % Change  0 %    Family Pre  30 %    Family Post  30 %    Family % Change  0 %    GLOBAL Pre  29.12 %    GLOBAL Post  29.82 %    GLOBAL % Change  2.4 %       Nutrition & Weight - Outcomes: Pre Biometrics - 05/26/19 1529      Pre Biometrics   Height  5' 9.1" (1.755 m)    Weight  226 lb 9.6 oz (102.8 kg)    BMI (Calculated)  33.37    Single Leg Stand  14.19 seconds       Post Biometrics - 07/21/19 0916       Post  Biometrics   Height  5' 9.1" (1.755 m)    Weight  226 lb 6.4 oz (102.7 kg)    BMI (Calculated)  33.34       Nutrition:   Nutrition Discharge: Nutrition Assessments - 05/26/19 1530      MEDFICTS Scores   Pre Score  48       Education Questionnaire Score: Knowledge Questionnaire Score - 07/21/19 0921      Knowledge Questionnaire Score   Pre Score  23/26 Education Focus: Nutrition and Exercise    Post Score  24/26       Goals reviewed with patient; copy given to patient.

## 2019-07-23 ENCOUNTER — Other Ambulatory Visit: Payer: Self-pay

## 2019-07-23 ENCOUNTER — Encounter: Payer: BC Managed Care – PPO | Admitting: *Deleted

## 2019-07-23 DIAGNOSIS — Z953 Presence of xenogenic heart valve: Secondary | ICD-10-CM | POA: Diagnosis not present

## 2019-07-23 DIAGNOSIS — Z952 Presence of prosthetic heart valve: Secondary | ICD-10-CM

## 2019-07-23 NOTE — Progress Notes (Signed)
Daily Session Note  Patient Details  Name: Erik Allen MRN: [id number] Date of Birth: 02/14/1955 Referring Provider:     Cardiac Rehab from 05/26/2019 in [facility] Cardiac and Pulmonary Rehab  Referring Provider Erik Sacramento MD      Encounter Date: 07/23/2019  Check In:  Session Check In - 07/23/19 0914      Check-In   Supervising physician immediately available to respond to emergencies See telemetry face sheet for immediately available ER MD    Location [facility]-Cardiac & Pulmonary Rehab    Staff Present Erik Allen RDN, LDN;Erik Luan Pulling, MA, RCEP, CCRP, CCET;Erik Vilar, RN, BSN, CCRP    Virtual Visit No    Medication changes reported     No    Fall or balance concerns reported    No    Warm-up and Cool-down Performed on first and last piece of equipment    Resistance Training Performed Yes    VAD Patient? No    PAD/SET Patient? No      Pain Assessment   Currently in Pain? No/denies              Social History   Tobacco Use  Smoking Status Never Smoker  Smokeless Tobacco Never Used    Goals Met:  Independence with exercise equipment Exercise tolerated well No report of cardiac concerns or symptoms  Goals Unmet:  Not Applicable  Comments:  Erik Allen graduated today from  rehab with 11/36 sessions completed.  Details of the patient's exercise prescription and what He needs to do in order to continue the prescription and progress were discussed with patient.  Patient was given a copy of prescription and goals.  Patient verbalized understanding.  Erik Allen plans to continue to exercise by walking at home.    Dr. Emily Allen is Medical Director for [place] [facility].

## 2019-07-23 NOTE — Progress Notes (Signed)
Cardiac Individual Treatment Plan  Patient Details  Name: Erik Allen MRN: [id number] Date of Birth: 1955/06/23 Referring Provider:     Cardiac Rehab from 05/26/2019 in [facility] Cardiac and Pulmonary Rehab  Referring Provider [name] MD      Initial Encounter Date:    Cardiac Rehab from 05/26/2019 in [facility] Cardiac and Pulmonary Rehab  Date 05/26/19      Visit Diagnosis: S/P aortic valve replacement  Patient's Home Medications on Admission:  Current Outpatient Medications:  .  albuterol (PROVENTIL) (2.5 MG/3ML) 0.083% nebulizer solution, USE 1 VIAL VIA NEBULIZER EVERY 6 HOURS AS NEEDED (Patient taking differently: Take 2.5 mg by nebulization every 6 (six) hours as needed for wheezing or shortness of breath. USE 1 VIAL VIA NEBULIZER EVERY 6 HOURS AS NEEDE), Disp: 150 mL, Rfl: 3 .  albuterol (VENTOLIN HFA) 108 (90 Base) MCG/ACT inhaler, INHALE 2 PUFFS INTO THE LUNGS EVERY 6 HOURS AS NEEDED FOR WHEEZING OR SHORTNESS OF BREATH, Disp: 18 g, Rfl: 4 .  ascorbic acid (VITAMIN C) 500 MG tablet, Take 1 tablet (500 mg total) by mouth daily., Disp: 30 tablet, Rfl: 0 .  aspirin EC 325 MG EC tablet, Take 1 tablet (325 mg total) by mouth daily., Disp: 30 tablet, Rfl: 0 .  cholecalciferol (QC VITAMIN D3) 10 MCG (400 UNIT) TABS tablet, Take 400 Units by mouth daily., Disp: , Rfl:  .  cyanocobalamin (V-R VITAMIN B-12) 500 MCG tablet, Take 1 tablet (500 mcg total) by mouth daily., Disp: , Rfl:  .  finasteride (PROSCAR) 5 MG tablet, TAKE 1 TABLET BY MOUTH EVERY DAY, Disp: 90 tablet, Rfl: 1 .  Fluticasone-Salmeterol (ADVAIR DISKUS) 250-50 MCG/DOSE AEPB, INHALE 1 PUFF BY MOUTH TWICE DAILY. RINSE MOUTH AFTER USE, Disp: 180 each, Rfl: 1 .  metFORMIN (GLUCOPHAGE) 500 MG tablet, TAKE 1 TABLET (500 MG TOTAL) BY MOUTH 2 (TWO) TIMES DAILY WITH A MEAL., Disp: 180 tablet, Rfl: 1 .  metoprolol tartrate (LOPRESSOR) 100 MG tablet, Take 1 tablet (100 mg total) by mouth 2 (two) times daily., Disp: 30 tablet, Rfl: 0 .   montelukast (SINGULAIR) 10 MG tablet, TAKE 1 TABLET BY MOUTH EVERY NIGHT AT BEDTIME (Patient taking differently: Take 10 mg by mouth at bedtime. ), Disp: 90 tablet, Rfl: 2 .  pantoprazole (PROTONIX) 40 MG tablet, Take 40 mg by mouth at bedtime., Disp: , Rfl:  .  simvastatin (ZOCOR) 80 MG tablet, Take 80 mg by mouth at bedtime., Disp: , Rfl:  .  valsartan-hydrochlorothiazide (DIOVAN-HCT) 160-25 MG tablet, Take 1 tablet by mouth daily., Disp: 90 tablet, Rfl: 1 .  zinc sulfate 220 (50 Zn) MG capsule, Take 1 capsule (220 mg total) by mouth daily., Disp: 30 capsule, Rfl: 0  Past Medical History: Past Medical History:  Diagnosis Date  . Aortic stenosis 06/21/2014   Echo 01/2018: Mild LVH, normal sys fxn EF 55-60%, G1DD, moderate AS with mildly dilated LA. rec yearly echocardiogram  . Asthma 09/13/99  . BENIGN PROSTATIC HYPERTROPHY 05/14/2003  . CAP (community acquired pneumonia) 01/31/2015  . COVID-19 virus infection 06/2019  . Diabetes mellitus without complication ([place])   . Disorder of vocal cord 2012   h/o thrush on vocal cords per patient  . GERD (gastroesophageal reflux disease) 10/13/01   ENT eval for GERD and thrush 2011  . History of kidney stones   . HTN (hypertension)   . Hyperlipemia 10/13/97  . S/P minimally invasive aortic valve replacement with bioprosthetic valve 04/29/2019   [facility]wards Inspiris Resilia stented bovine  pericardial tissue valve (size 23 mm)  . Seasonal allergic rhinitis    worse in spring  . Systolic murmur   . Transaminitis 2014   presumed fatty liver without [place], normal iron and viral hep panel    Tobacco Use: Social History   Tobacco Use  Smoking Status Never Smoker  Smokeless Tobacco Never Used    Labs: Recent Review Flowsheet Data    Labs for ITP Cardiac and Pulmonary Rehab Latest Ref Rng & Units 04/30/2019 04/30/2019 04/30/2019 05/01/2019 06/22/2019   Cholestrol 0 - 200 mg/dL - - - - -   LDLCALC 0 - 99 mg/dL - - - - -   LDLDIRECT mg/dL - - - - -   HDL  >39.00 mg/dL - - - - -   Trlycerides <150 mg/dL - - - - 81   Hemoglobin A1c 4.8 - 5.6 % - - - - -   PHART 7.35 - 7.45 7.306(L) 7.328(L) - - -   PCO2ART 32 - 48 mmHg 43.1 40.0 - - -   HCO3 20.0 - 28.0 mmol/L 21.5 20.9 - - -   TCO2 22 - 32 mmol/L 23 22 - - -   ACIDBASEDEF 0.0 - 2.0 mmol/L 4.0(H) 5.0(H) - - -   O2SAT % 93.0 91.0 59.5 65.0 -       Exercise Target Goals: Exercise Program Goal: Individual exercise prescription set using results from initial 6 min walk test and THRR while considering  patient's activity barriers and safety.   Exercise Prescription Goal: Initial exercise prescription builds to 30-45 minutes a day of aerobic activity, 2-3 days per week.  Home exercise guidelines will be given to patient during program as part of exercise prescription that the participant will acknowledge.   Education: Aerobic Exercise & Resistance Training: - Gives group verbal and written instruction on the various components of exercise. Focuses on aerobic and resistive training programs and the benefits of this training and how to safely progress through these programs..   Education: Exercise & Equipment Safety: - Individual verbal instruction and demonstration of equipment use and safety with use of the equipment.   Cardiac Rehab from 05/26/2019 in [facility] Cardiac and Pulmonary Rehab  Date 05/26/19  Educator [facility]  Instruction Review Code 1- Verbalizes Understanding      Education: Exercise Physiology & General Exercise Guidelines: - Group verbal and written instruction with models to review the exercise physiology of the cardiovascular system and associated critical values. Provides general exercise guidelines with specific guidelines to those with heart or lung disease.    Education: Flexibility, Balance, Mind/Body Relaxation: Provides group verbal/written instruction on the benefits of flexibility and balance training, including mind/body exercise modes such as yoga, pilates and tai  chi.  Demonstration and skill practice provided.   Activity Barriers & Risk Stratification:  Activity Barriers & Cardiac Risk Stratification - 05/26/19 1526      Activity Barriers & Cardiac Risk Stratification   Activity Barriers Deconditioning;Balance Concerns;Muscular Weakness;Joint Problems    Cardiac Risk Stratification Low           6 Minute Walk:  6 Minute Walk    Row Name 05/26/19 1525 07/21/19 0857       6 Minute Walk   Phase Initial Discharge    Distance 1005 feet 1271 feet    Distance % Change -- 26.5 %    Distance Feet Change -- 266 ft    Walk Time 6 minutes 6 minutes    # of Rest Breaks 0  0    MPH 1.9 2.41    METS 2.65 3.25    RPE 7 8    VO2 Peak 9.26 11.39    Symptoms Yes (comment) No    Comments hip pain 3/10 --    Resting HR 96 bpm 105 bpm    Resting BP 130/74 124/70    Resting Oxygen Saturation  97 % --    Exercise Oxygen Saturation  during 6 min walk 96 % --    Max Ex. HR 106 bpm 112 bpm    Max Ex. BP 136/64 146/66    2 Minute Post BP 126/74 --           Oxygen Initial Assessment:   Oxygen Re-Evaluation:   Oxygen Discharge (Final Oxygen Re-Evaluation):   Initial Exercise Prescription:  Initial Exercise Prescription - 05/26/19 1500      Date of Initial Exercise RX and Referring Provider   Date 05/26/19    Referring Provider [name] MD      Treadmill   MPH 1.9    Grade 1    Minutes 15    METs 2.72      Elliptical   Level 1    Speed 2.1    Minutes 15      REL-XR   Level 2    Speed 50    Minutes 15    METs 2.7      Prescription Details   Frequency (times per week) 2    Duration Progress to 30 minutes of continuous aerobic without signs/symptoms of physical distress      Intensity   THRR 40-80% of Max Heartrate 121-146    Ratings of Perceived Exertion 11-13    Perceived Dyspnea 0-4      Progression   Progression Continue to progress workloads to maintain intensity without signs/symptoms of physical distress.       Resistance Training   Training Prescription Yes    Weight 3 lb    Reps 10-15           Perform Capillary Blood Glucose checks as needed.  Exercise Prescription Changes:  Exercise Prescription Changes    Row Name 05/26/19 1500 06/02/19 1400 06/16/19 1500 07/15/19 1400       Response to Exercise   Blood Pressure (Admit) 130/74 128/66 130/74 118/82    Blood Pressure (Exercise) 136/64 158/56 142/70 142/76    Blood Pressure (Exit) 126/74 104/66 122/74 116/68    Heart Rate (Admit) 96 bpm 127 bpm 109 bpm 74 bpm    Heart Rate (Exercise) 106 bpm 163 bpm 130 bpm 110 bpm    Heart Rate (Exit) 98 bpm 139 bpm 115 bpm 89 bpm    Oxygen Saturation (Admit) 97 % -- -- --    Oxygen Saturation (Exercise) 96 % -- -- --    Rating of Perceived Exertion (Exercise) _0 Symptoms hip pain 3/10 -- none none    Comments walk test results second day difficulty on elliptical --    Duration -- -- Continue with 30 min of aerobic exercise without signs/symptoms of physical distress. Continue with 30 min of aerobic exercise without signs/symptoms of physical distress.    Intensity -- -- THRR unchanged THRR unchanged      Progression   Progression -- -- Continue to progress workloads to maintain intensity without signs/symptoms of physical distress. Continue to progress workloads to maintain intensity without signs/symptoms of physical distress.    Average METs -- -- 2.72  2.85      Resistance Training   Training Prescription -- Yes Yes Yes    Weight -- 3 lb 4 lb 4 lb    Reps -- 10-15 10-15 10-15      Interval Training   Interval Training -- No No No      Treadmill   MPH -- 1.9 1.9 2.1    Grade -- _0 Minutes -- _1 METs -- 2.72 2.72 2.91      Elliptical   Level -- 1 1 --    Minutes -- 15  HR high at 12 min - back to TM 12 --      REL-XR   Level -- -- -- 2    Minutes -- -- -- 15    METs -- -- -- 2.8           Exercise Comments:  Exercise Comments    Row Name  05/28/19 0818 07/23/19 0917         Exercise Comments First full day of exercise!  Patient was oriented to gym and equipment including functions, settings, policies, and procedures.  Patient's individual exercise prescription and treatment plan were reviewed.  All starting workloads were established based on the results of the 6 minute walk test done at initial orientation visit.  The plan for exercise progression was also introduced and progression will be customized based on patient's performance and goals. Damyn graduated today from  rehab with 11/36 sessions completed.  Details of the patient's exercise prescription and what He needs to do in order to continue the prescription and progress were discussed with patient.  Patient was given a copy of prescription and goals.  Patient verbalized understanding.  Eitan plans to continue to exercise by walking at home.             Exercise Goals and Review:  Exercise Goals    Row Name 05/26/19 1528             Exercise Goals   Increase Physical Activity Yes       Intervention Provide advice, education, support and counseling about physical activity/exercise needs.;Develop an individualized exercise prescription for aerobic and resistive training based on initial evaluation findings, risk stratification, comorbidities and participant's personal goals.       Expected Outcomes Short Term: Attend rehab on a regular basis to increase amount of physical activity.;Long Term: Add in home exercise to make exercise part of routine and to increase amount of physical activity.;Long Term: Exercising regularly at least 3-5 days a week.       Increase Strength and Stamina Yes       Intervention Provide advice, education, support and counseling about physical activity/exercise needs.;Develop an individualized exercise prescription for aerobic and resistive training based on initial evaluation findings, risk stratification, comorbidities and participant's  personal goals.       Expected Outcomes Short Term: Increase workloads from initial exercise prescription for resistance, speed, and METs.;Short Term: Perform resistance training exercises routinely during rehab and add in resistance training at home;Long Term: Improve cardiorespiratory fitness, muscular endurance and strength as measured by increased METs and functional capacity (6MWT)       Able to understand and use rate of perceived exertion (RPE) scale Yes       Intervention Provide education and explanation on how to use RPE scale       Expected Outcomes Short Term: Able to use RPE daily in rehab  to express subjective intensity level;Long Term:  Able to use RPE to guide intensity level when exercising independently       Able to understand and use Dyspnea scale Yes       Intervention Provide education and explanation on how to use Dyspnea scale       Expected Outcomes Short Term: Able to use Dyspnea scale daily in rehab to express subjective sense of shortness of breath during exertion;Long Term: Able to use Dyspnea scale to guide intensity level when exercising independently       Knowledge and understanding of Target Heart Rate Range (THRR) Yes       Intervention Provide education and explanation of THRR including how the numbers were predicted and where they are located for reference       Expected Outcomes Short Term: Able to state/look up THRR;Short Term: Able to use daily as guideline for intensity in rehab;Long Term: Able to use THRR to govern intensity when exercising independently       Able to check pulse independently Yes       Intervention Provide education and demonstration on how to check pulse in carotid and radial arteries.;Review the importance of being able to check your own pulse for safety during independent exercise       Expected Outcomes Short Term: Able to explain why pulse checking is important during independent exercise;Long Term: Able to check pulse independently and  accurately       Understanding of Exercise Prescription Yes       Intervention Provide education, explanation, and written materials on patient's individual exercise prescription       Expected Outcomes Short Term: Able to explain program exercise prescription;Long Term: Able to explain home exercise prescription to exercise independently              Exercise Goals Re-Evaluation :  Exercise Goals Re-Evaluation    Row Name 05/28/19 0819 06/02/19 1510 06/16/19 1552 07/14/19 1611 07/21/19 0917     Exercise Goal Re-Evaluation   Exercise Goals Review Able to understand and use rate of perceived exertion (RPE) scale;Knowledge and understanding of Target Heart Rate Range (THRR);Able to check pulse independently;Understanding of Exercise Prescription Increase Physical Activity;Increase Strength and Stamina;Able to understand and use rate of perceived exertion (RPE) scale;Able to understand and use Dyspnea scale;Knowledge and understanding of Target Heart Rate Range (THRR);Able to check pulse independently;Understanding of Exercise Prescription Increase Physical Activity;Increase Strength and Stamina;Understanding of Exercise Prescription Increase Physical Activity;Increase Strength and Stamina;Understanding of Exercise Prescription Increase Physical Activity;Increase Strength and Stamina;Understanding of Exercise Prescription   Comments Reviewed RPE scale, THR and program prescription with pt today.  Pt voiced understanding and was given a copy of goals to take home. The elliptical has been challenging for Devonta but he has been able to add more time on elliptical. Mason is doing well in rehab.  He is building up his tolerance on the ellipitcal but is not a fan.  We will move his equipment around some.  He is up to 4 lb weights.  We will continue to monitor his progress. Ripken returned to rehab today after being out for test positive for COVID-19.  He was able to retun to reduced workloads.  We will  continue to monitor his progress. Renato improved his post 6MWT by 26.5%!  He requested to graduate early as he is supposed to return to work next week. He is planning to get into a routine of walking daily after work.  He has  more energy and more mobility and is very pleased with his progress so far.   Expected Outcomes Short: Use RPE daily to regulate intensity. Long: Follow program prescription in THR. Short:  build up to 15 min on elliptical Long: increase overall stamina Short: Try out new equipment  Long: Continue to improve stamina. Short: Return to regular attendance Long: Continue to improve stamina Continue to exercise independently.          Discharge Exercise Prescription (Final Exercise Prescription Changes):  Exercise Prescription Changes - 07/15/19 1400      Response to Exercise   Blood Pressure (Admit) 118/82    Blood Pressure (Exercise) 142/76    Blood Pressure (Exit) 116/68    Heart Rate (Admit) 74 bpm    Heart Rate (Exercise) 110 bpm    Heart Rate (Exit) 89 bpm    Rating of Perceived Exertion (Exercise) 13    Symptoms none    Duration Continue with 30 min of aerobic exercise without signs/symptoms of physical distress.    Intensity THRR unchanged      Progression   Progression Continue to progress workloads to maintain intensity without signs/symptoms of physical distress.    Average METs 2.85      Resistance Training   Training Prescription Yes    Weight 4 lb    Reps 10-15      Interval Training   Interval Training No      Treadmill   MPH 2.1    Grade 1    Minutes 15    METs 2.91      REL-XR   Level 2    Minutes 15    METs 2.8           Nutrition:  Target Goals: Understanding of nutrition guidelines, daily intake of sodium <1561m, cholesterol <2079m calories 30% from fat and 7% or less from saturated fats, daily to have 5 or more servings of fruits and vegetables.  Education: Controlling Sodium/Reading Food Labels -Group verbal and written  material supporting the discussion of sodium use in heart healthy nutrition. Review and explanation with models, verbal and written materials for utilization of the food label.   Education: General Nutrition Guidelines/Fats and Fiber: -Group instruction provided by verbal, written material, models and posters to present the general guidelines for heart healthy nutrition. Gives an explanation and review of dietary fats and fiber.   Biometrics:  Pre Biometrics - 05/26/19 1529      Pre Biometrics   Height 5' 9.1" (1.755 m)    Weight 226 lb 9.6 oz (102.8 kg)    BMI (Calculated) 33.37    Single Leg Stand 14.19 seconds           Post Biometrics - 07/21/19 0916       Post  Biometrics   Height 5' 9.1" (1.755 m)    Weight 226 lb 6.4 oz (102.7 kg)    BMI (Calculated) 33.34           Nutrition Therapy Plan and Nutrition Goals:   Nutrition Assessments:  Nutrition Assessments - 05/26/19 1530      MEDFICTS Scores   Pre Score 48           MEDIFICTS Score Key:          ?70 Need to make dietary changes          40-70 Heart Healthy Diet         ? 40 Therapeutic Level Cholesterol Diet  Nutrition Goals Re-Evaluation:  Nutrition Goals Re-Evaluation    Row Name 07/21/19 2423             Goals   Comment Greysen has not had a chance to meet with dietician since he was out for so long with COVID, but he does feel that he has been able to ask questions and get started on a heart healthy diet.       Expected Outcome Continue to make healthier choices for improved diet.              Nutrition Goals Discharge (Final Nutrition Goals Re-Evaluation):  Nutrition Goals Re-Evaluation - 07/21/19 0926      Goals   Comment Kimari has not had a chance to meet with dietician since he was out for so long with COVID, but he does feel that he has been able to ask questions and get started on a heart healthy diet.    Expected Outcome Continue to make healthier choices for improved diet.            Psychosocial: Target Goals: Acknowledge presence or absence of significant depression and/or stress, maximize coping skills, provide positive support system. Participant is able to verbalize types and ability to use techniques and skills needed for reducing stress and depression.   Education: Depression - Provides group verbal and written instruction on the correlation between heart/lung disease and depressed mood, treatment options, and the stigmas associated with seeking treatment.   Education: Sleep Hygiene -Provides group verbal and written instruction about how sleep can affect your health.  Define sleep hygiene, discuss sleep cycles and impact of sleep habits. Review good sleep hygiene tips.     Education: Stress and Anxiety: - Provides group verbal and written instruction about the health risks of elevated stress and causes of high stress.  Discuss the correlation between heart/lung disease and anxiety and treatment options. Review healthy ways to manage with stress and anxiety.    Initial Review & Psychosocial Screening:  Initial Psych Review & Screening - 05/22/19 1004      Initial Review   Current issues with None Identified      Family Dynamics   Good Support System? Yes   Brother lives away, friends and neighbors and church     Barriers   Psychosocial barriers to participate in program There are no identifiable barriers or psychosocial needs.;The patient should benefit from training in stress management and relaxation.      Screening Interventions   Interventions Encouraged to exercise    Expected Outcomes Short Term goal: Utilizing psychosocial counselor, staff and physician to assist with identification of specific Stressors or current issues interfering with healing process. Setting desired goal for each stressor or current issue identified.;Long Term Goal: Stressors or current issues are controlled or eliminated.;Short Term goal: Identification and review  with participant of any Quality of Life or Depression concerns found by scoring the questionnaire.;Long Term goal: The participant improves quality of Life and PHQ9 Scores as seen by post scores and/or verbalization of changes           Quality of Life Scores:   Quality of Life - 07/21/19 0934      Quality of Life   Select Quality of Life      Quality of Life Scores   Health/Function Pre 29.2 %    Health/Function Post 29.6 %    Health/Function % Change 1.37 %    Socioeconomic Pre 27.75 %    Socioeconomic Post 30 %  Socioeconomic % Change  8.11 %    Psych/Spiritual Pre 30 %    Psych/Spiritual Post 30 %    Psych/Spiritual % Change 0 %    Family Pre 30 %    Family Post 30 %    Family % Change 0 %    GLOBAL Pre 29.12 %    GLOBAL Post 29.82 %    GLOBAL % Change 2.4 %          Scores of 19 and below usually indicate a poorer quality of life in these areas.  A difference of  2-3 points is a clinically meaningful difference.  A difference of 2-3 points in the total score of the Quality of Life Index has been associated with significant improvement in overall quality of life, self-image, physical symptoms, and general health in studies assessing change in quality of life.  PHQ-9: Recent Review Flowsheet Data    Depression screen [facility] 2/9 07/21/2019 05/26/2019 07/12/2017   Decreased Interest 0 0 0   Down, Depressed, Hopeless 0 0 0   PHQ - 2 Score 0 0 0   Altered sleeping 0 2 -   Tired, decreased energy 1 1 -   Change in appetite 0 0 -   Feeling bad or failure about yourself  0 0 -   Trouble concentrating 0 0 -   Moving slowly or fidgety/restless 0 0 -   Suicidal thoughts 0 0 -   PHQ-9 Score 1 3 -   Difficult doing work/chores Not difficult at all Not difficult at all -     Interpretation of Total Score  Total Score Depression Severity:  1-4 = Minimal depression, 5-9 = Mild depression, 10-14 = Moderate depression, 15-19 = Moderately severe depression, 20-27 = Severe depression    Psychosocial Evaluation and Intervention:  Psychosocial Evaluation - 07/21/19 0922      Discharge Psychosocial Assessment & Intervention   Comments Tyreese is planning to graduate early on Thursday.  He has been cleared to return to work and will be unable to attend due to his schedule.  He is pleased with his progress, despite being out with COVID for almost a month as well.  He feels that he has more energy and improved mobility like he wanted to accomplish.  He no longer feels as sluggish.  He is determined to get into a walking routine at home now.           Psychosocial Re-Evaluation:   Psychosocial Discharge (Final Psychosocial Re-Evaluation):   Vocational Rehabilitation: Provide vocational rehab assistance to qualifying candidates.   Vocational Rehab Evaluation & Intervention:  Vocational Rehab - 05/22/19 1008      Initial Vocational Rehab Evaluation & Intervention   Assessment shows need for Vocational Rehabilitation No           Education: Education Goals: Education classes will be provided on a variety of topics geared toward better understanding of heart health and risk factor modification. Participant will state understanding/return demonstration of topics presented as noted by education test scores.  Learning Barriers/Preferences:  Learning Barriers/Preferences - 05/22/19 1007      Learning Barriers/Preferences   Learning Barriers None    Learning Preferences None           General Cardiac Education Topics:  AED/CPR: - Group verbal and written instruction with the use of models to demonstrate the basic use of the AED with the basic ABC's of resuscitation.   Anatomy & Physiology of the Heart: - Group verbal  and written instruction and models provide basic cardiac anatomy and physiology, with the coronary electrical and arterial systems. Review of Valvular disease and Heart Failure   Cardiac Procedures: - Group verbal and written instruction to  review commonly prescribed medications for heart disease. Reviews the medication, class of the drug, and side effects. Includes the steps to properly store meds and maintain the prescription regimen. (beta blockers and nitrates)   Cardiac Medications I: - Group verbal and written instruction to review commonly prescribed medications for heart disease. Reviews the medication, class of the drug, and side effects. Includes the steps to properly store meds and maintain the prescription regimen.   Cardiac Medications II: -Group verbal and written instruction to review commonly prescribed medications for heart disease. Reviews the medication, class of the drug, and side effects. (all other drug classes)    Go Sex-Intimacy & Heart Disease, Get SMART - Goal Setting: - Group verbal and written instruction through game format to discuss heart disease and the return to sexual intimacy. Provides group verbal and written material to discuss and apply goal setting through the application of the S.M.A.R.T. Method.   Other Matters of the Heart: - Provides group verbal, written materials and models to describe Stable Angina and Peripheral Artery. Includes description of the disease process and treatment options available to the cardiac patient.   Infection Prevention: - Provides verbal and written material to individual with discussion of infection control including proper hand washing and proper equipment cleaning during exercise session.   Cardiac Rehab from 05/26/2019 in [facility] Cardiac and Pulmonary Rehab  Date 05/26/19  Educator [facility]  Instruction Review Code 1- Verbalizes Understanding      Falls Prevention: - Provides verbal and written material to individual with discussion of falls prevention and safety.   Cardiac Rehab from 05/26/2019 in [facility] Cardiac and Pulmonary Rehab  Date 05/26/19  Educator [facility]  Instruction Review Code 1- Verbalizes Understanding      Other: -Provides group and verbal  instruction on various topics (see comments)   Knowledge Questionnaire Score:  Knowledge Questionnaire Score - 07/21/19 0921      Knowledge Questionnaire Score   Pre Score 23/26 Education Focus: Nutrition and Exercise    Post Score 24/26           Core Components/Risk Factors/Patient Goals at Admission:  Personal Goals and Risk Factors at Admission - 05/26/19 1531      Core Components/Risk Factors/Patient Goals on Admission    Weight Management Yes;Weight Loss;Obesity    Intervention Weight Management: Develop a combined nutrition and exercise program designed to reach desired caloric intake, while maintaining appropriate intake of nutrient and fiber, sodium and fats, and appropriate energy expenditure required for the weight goal.;Weight Management: Provide education and appropriate resources to help participant work on and attain dietary goals.;Weight Management/Obesity: Establish reasonable short term and long term weight goals.;Obesity: Provide education and appropriate resources to help participant work on and attain dietary goals.    Admit Weight 226 lb (102.5 kg)    Goal Weight: Short Term 220 lb (99.8 kg)    Goal Weight: Long Term 216 lb (98 kg)    Expected Outcomes Short Term: Continue to assess and modify interventions until short term weight is achieved;Long Term: Adherence to nutrition and physical activity/exercise program aimed toward attainment of established weight goal;Weight Loss: Understanding of general recommendations for a balanced deficit meal plan, which promotes 1-2 lb weight loss per week and includes a negative energy balance of [id number] kcal/d;Understanding recommendations  for meals to include 15-35% energy as protein, 25-35% energy from fat, 35-60% energy from carbohydrates, less than 281m of dietary cholesterol, 20-35 gm of total fiber daily;Understanding of distribution of calorie intake throughout the day with the consumption of 4-5 meals/snacks    Diabetes  Yes    Intervention Provide education about signs/symptoms and action to take for hypo/hyperglycemia.;Provide education about proper nutrition, including hydration, and aerobic/resistive exercise prescription along with prescribed medications to achieve blood glucose in normal ranges: Fasting glucose 65-99 mg/dL    Expected Outcomes Short Term: Participant verbalizes understanding of the signs/symptoms and immediate care of hyper/hypoglycemia, proper foot care and importance of medication, aerobic/resistive exercise and nutrition plan for blood glucose control.;Long Term: Attainment of HbA1C < 7%.    Hypertension Yes    Intervention Provide education on lifestyle modifcations including regular physical activity/exercise, weight management, moderate sodium restriction and increased consumption of fresh fruit, vegetables, and low fat dairy, alcohol moderation, and smoking cessation.;Monitor prescription use compliance.    Expected Outcomes Short Term: Continued assessment and intervention until BP is < 140/966mHG in hypertensive participants. < 130/8065mG in hypertensive participants with diabetes, heart failure or chronic kidney disease.;Long Term: Maintenance of blood pressure at goal levels.    Lipids Yes    Intervention Provide education and support for participant on nutrition & aerobic/resistive exercise along with prescribed medications to achieve LDL <42m1mDL >40mg34m Expected Outcomes Short Term: Participant states understanding of desired cholesterol values and is compliant with medications prescribed. Participant is following exercise prescription and nutrition guidelines.;Long Term: Cholesterol controlled with medications as prescribed, with individualized exercise RX and with personalized nutrition plan. Value goals: LDL < 42mg,39m > 40 mg.           Education:Diabetes - Individual verbal and written instruction to review signs/symptoms of diabetes, desired ranges of glucose level  fasting, after meals and with exercise. Acknowledge that pre and post exercise glucose checks will be done for 3 sessions at entry of program.   [facility]  Date 05/26/19  [facility] Code 1- Verbalizes Understanding      Education: Know Your Numbers and Risk Factors: -Group verbal and written instruction about important numbers in your health.  Discussion of what are risk factors and how they play a role in the disease process.  Review of Cholesterol, Blood Pressure, Diabetes, and BMI and the role they play in your overall health.   Core Components/Risk Factors/Patient Goals Review:   Goals and Risk Factor Review    Row Name 07/21/19 0928             Core Components/Risk Factors/Patient Goals Review   Personal Goals Review Weight Management/Obesity;Hypertension;Lipids;Diabetes       Review CharleMieczyslawone well in rehab. His weight is stable and blood pressures have been good.  Blood sugars are doing well.  He plans to continue to keep close eye on these things.       Expected Outcomes Continue to monitor risk factors.              Core Components/Risk Factors/Patient Goals at Discharge (Final Review):   Goals and Risk Factor Review - 07/21/19 0928      Core Components/Risk Factors/Patient Goals Review   Personal Goals Review Weight Management/Obesity;Hypertension;Lipids;Diabetes    Review CharleJowelone well in rehab. His weight is stable and blood pressures have been good.  Blood sugars are  doing well.  He plans to continue to keep close eye on these things.    Expected Outcomes Continue to monitor risk factors.           ITP Comments:  ITP Comments    Row Name 05/22/19 1022 05/26/19 1524 05/28/19 0818 06/03/19 1049 06/22/19 1307   ITP Comments Completed Virtual Orientation call.  Has appt.on 4/13 with EP for eval and gym orientation.  Diagnosis documentation can be found in [facility] 04/29/2019  Completed 6MWT and gym orientation.  Initial ITP created and sent for review to Dr. Emily Filbert, Medical Director. First full day of exercise!  Patient was oriented to gym and equipment including functions, settings, policies, and procedures.  Patient's individual exercise prescription and treatment plan were reviewed.  All starting workloads were established based on the results of the 6 minute walk test done at initial orientation visit.  The plan for exercise progression was also introduced and progression will be customized based on patient's performance and goals. 30 day chart review completed. ITP sent to Dr Zachery Dakins Medical Director, for review,changes as needed and signature. Continue with ITP if no changes requested Regis called to let us know that he has been admitted for COVID-19 as of last night.  He will be out until 6/1.   Row Name 06/30/19 1345 07/01/19 0634 07/14/19 0849 07/23/19 0917     ITP Comments Acen has not attended since 06/18/19 due to Covid-19. 30 Day review completed. ITP review done, changes made as directed,and approval shown by signature of  Scientist, research (life sciences). Karston returned today after being hospitalized with COVID-19.  He did well! Dai graduated today from  rehab with 11/36 sessions completed.  Details of the patient's exercise prescription and what He needs to do in order to continue the prescription and progress were discussed with patient.  Patient was given a copy of prescription and goals.  Patient verbalized understanding.  Garnett plans to continue to exercise by walking at home.           Comments: Discharged   Back to Work

## 2019-07-23 NOTE — Progress Notes (Signed)
Discharge Progress Report  Patient Details  Name: Erik Allen MRN: [id number] Date of Birth: 12/17/1955 Referring Provider:     Cardiac Rehab from 05/26/2019 in [facility] Cardiac and Pulmonary Rehab  Referring Provider [name] MD       Number of Visits: 11/36  Reason for Discharge:  Early Exit:  Back to work  Smoking History:  Social History   Tobacco Use  Smoking Status Never Smoker  Smokeless Tobacco Never Used    Diagnosis:  S/P aortic valve replacement  ADL UCSD:   Initial Exercise Prescription:  Initial Exercise Prescription - 05/26/19 1500      Date of Initial Exercise RX and Referring Provider   Date 05/26/19    Referring Provider [name] MD      Treadmill   MPH 1.9    Grade 1    Minutes 15    METs 2.72      Elliptical   Level 1    Speed 2.1    Minutes 15      REL-XR   Level 2    Speed 50    Minutes 15    METs 2.7      Prescription Details   Frequency (times per week) 2    Duration Progress to 30 minutes of continuous aerobic without signs/symptoms of physical distress      Intensity   THRR 40-80% of Max Heartrate 121-146    Ratings of Perceived Exertion 11-13    Perceived Dyspnea 0-4      Progression   Progression Continue to progress workloads to maintain intensity without signs/symptoms of physical distress.      Resistance Training   Training Prescription Yes    Weight 3 lb    Reps 10-15           Discharge Exercise Prescription (Final Exercise Prescription Changes):  Exercise Prescription Changes - 07/15/19 1400      Response to Exercise   Blood Pressure (Admit) 118/82    Blood Pressure (Exercise) 142/76    Blood Pressure (Exit) 116/68    Heart Rate (Admit) 74 bpm    Heart Rate (Exercise) 110 bpm    Heart Rate (Exit) 89 bpm    Rating of Perceived Exertion (Exercise) 13    Symptoms none    Duration Continue with 30 min of aerobic exercise without signs/symptoms of physical distress.    Intensity THRR  unchanged      Progression   Progression Continue to progress workloads to maintain intensity without signs/symptoms of physical distress.    Average METs 2.85      Resistance Training   Training Prescription Yes    Weight 4 lb    Reps 10-15      Interval Training   Interval Training No      Treadmill   MPH 2.1    Grade 1    Minutes 15    METs 2.91      REL-XR   Level 2    Minutes 15    METs 2.8           Functional Capacity:  6 Minute Walk    Row Name 05/26/19 1525 07/21/19 0857       6 Minute Walk   Phase Initial Discharge    Distance 1005 feet 1271 feet    Distance % Change -- 26.5 %    Distance Feet Change -- 266 ft    Walk Time 6 minutes 6 minutes    # of  Rest Breaks 0 0    MPH 1.9 2.41    METS 2.65 3.25    RPE 7 8    VO2 Peak 9.26 11.39    Symptoms Yes (comment) No    Comments hip pain 3/10 --    Resting HR 96 bpm 105 bpm    Resting BP 130/74 124/70    Resting Oxygen Saturation  97 % --    Exercise Oxygen Saturation  during 6 min walk 96 % --    Max Ex. HR 106 bpm 112 bpm    Max Ex. BP 136/64 146/66    2 Minute Post BP 126/74 --           Psychological, QOL, Others - Outcomes: PHQ 2/9: Depression screen [facility] 2/9 07/21/2019 05/26/2019 07/12/2017  Decreased Interest 0 0 0  Down, Depressed, Hopeless 0 0 0  PHQ - 2 Score 0 0 0  Altered sleeping 0 2 -  Tired, decreased energy 1 1 -  Change in appetite 0 0 -  Feeling bad or failure about yourself  0 0 -  Trouble concentrating 0 0 -  Moving slowly or fidgety/restless 0 0 -  Suicidal thoughts 0 0 -  PHQ-9 Score 1 3 -  Difficult doing work/chores Not difficult at all Not difficult at all -  Some recent data might be hidden    Quality of Life:  Quality of Life - 07/21/19 0934      Quality of Life   Select Quality of Life      Quality of Life Scores   Health/Function Pre 29.2 %    Health/Function Post 29.6 %    Health/Function % Change 1.37 %    Socioeconomic Pre 27.75 %    Socioeconomic  Post 30 %    Socioeconomic % Change  8.11 %    Psych/Spiritual Pre 30 %    Psych/Spiritual Post 30 %    Psych/Spiritual % Change 0 %    Family Pre 30 %    Family Post 30 %    Family % Change 0 %    GLOBAL Pre 29.12 %    GLOBAL Post 29.82 %    GLOBAL % Change 2.4 %             Exercise Goals Re-Evaluation:  Exercise Goals Re-Evaluation    Row Name 05/28/19 0819 06/02/19 1510 06/16/19 1552 07/14/19 1611 07/21/19 0917     Exercise Goal Re-Evaluation   Exercise Goals Review Able to understand and use rate of perceived exertion (RPE) scale;Knowledge and understanding of Target Heart Rate Range (THRR);Able to check pulse independently;Understanding of Exercise Prescription Increase Physical Activity;Increase Strength and Stamina;Able to understand and use rate of perceived exertion (RPE) scale;Able to understand and use Dyspnea scale;Knowledge and understanding of Target Heart Rate Range (THRR);Able to check pulse independently;Understanding of Exercise Prescription Increase Physical Activity;Increase Strength and Stamina;Understanding of Exercise Prescription Increase Physical Activity;Increase Strength and Stamina;Understanding of Exercise Prescription Increase Physical Activity;Increase Strength and Stamina;Understanding of Exercise Prescription   Comments Reviewed RPE scale, THR and program prescription with pt today.  Pt voiced understanding and was given a copy of goals to take home. The elliptical has been challenging for Yarnell but he has been able to add more time on elliptical. Lazaro is doing well in rehab.  He is building up his tolerance on the ellipitcal but is not a fan.  We will move his equipment around some.  He is up to 4 lb weights.  We  will continue to monitor his progress. Zohaib returned to rehab today after being out for test positive for COVID-19.  He was able to retun to reduced workloads.  We will continue to monitor his progress. Lafayette improved his post 6MWT by  26.5%!  He requested to graduate early as he is supposed to return to work next week. He is planning to get into a routine of walking daily after work.  He has more energy and more mobility and is very pleased with his progress so far.   Expected Outcomes Short: Use RPE daily to regulate intensity. Long: Follow program prescription in THR. Short:  build up to 15 min on elliptical Long: increase overall stamina Short: Try out new equipment  Long: Continue to improve stamina. Short: Return to regular attendance Long: Continue to improve stamina Continue to exercise independently.          Nutrition & Weight - Outcomes:  Pre Biometrics - 05/26/19 1529      Pre Biometrics   Height 5' 9.1" (1.755 m)    Weight 226 lb 9.6 oz (102.8 kg)    BMI (Calculated) 33.37    Single Leg Stand 14.19 seconds           Post Biometrics - 07/21/19 0916       Post  Biometrics   Height 5' 9.1" (1.755 m)    Weight 226 lb 6.4 oz (102.7 kg)    BMI (Calculated) 33.34           Nutrition:   Nutrition Discharge:  Nutrition Assessments - 05/26/19 1530      MEDFICTS Scores   Pre Score 48           Education Questionnaire Score:  Knowledge Questionnaire Score - 07/21/19 0921      Knowledge Questionnaire Score   Pre Score 23/26 Education Focus: Nutrition and Exercise    Post Score 24/26           Goals reviewed with patient; copy given to patient.

## 2019-08-13 ENCOUNTER — Other Ambulatory Visit: Payer: Self-pay | Admitting: Family Medicine

## 2019-08-13 DIAGNOSIS — Z125 Encounter for screening for malignant neoplasm of prostate: Secondary | ICD-10-CM

## 2019-08-13 DIAGNOSIS — E559 Vitamin D deficiency, unspecified: Secondary | ICD-10-CM

## 2019-08-13 DIAGNOSIS — E1169 Type 2 diabetes mellitus with other specified complication: Secondary | ICD-10-CM

## 2019-08-14 ENCOUNTER — Other Ambulatory Visit (INDEPENDENT_AMBULATORY_CARE_PROVIDER_SITE_OTHER): Payer: BC Managed Care – PPO

## 2019-08-14 DIAGNOSIS — E785 Hyperlipidemia, unspecified: Secondary | ICD-10-CM | POA: Diagnosis not present

## 2019-08-14 DIAGNOSIS — E1169 Type 2 diabetes mellitus with other specified complication: Secondary | ICD-10-CM

## 2019-08-14 DIAGNOSIS — Z125 Encounter for screening for malignant neoplasm of prostate: Secondary | ICD-10-CM | POA: Diagnosis not present

## 2019-08-14 DIAGNOSIS — E559 Vitamin D deficiency, unspecified: Secondary | ICD-10-CM

## 2019-08-14 LAB — HEMOGLOBIN A1C: Hgb A1c MFr Bld: 6.7 % — ABNORMAL HIGH (ref 4.6–6.5)

## 2019-08-14 LAB — LIPID PANEL
Cholesterol: 155 mg/dL (ref 0–200)
HDL: 54.9 mg/dL (ref >39.00)
LDL Cholesterol: 70 mg/dL (ref 0–99)
NonHDL: 100.3
Total CHOL/HDL Ratio: 3
Triglycerides: 153 mg/dL — ABNORMAL HIGH (ref 0.0–149.0)
VLDL: 30.6 mg/dL (ref 0.0–40.0)

## 2019-08-14 LAB — COMPREHENSIVE METABOLIC PANEL
ALT: 44 U/L (ref 0–53)
AST: 28 U/L (ref 0–37)
Albumin: 4.3 g/dL (ref 3.5–5.2)
Alkaline Phosphatase: 54 U/L (ref 39–117)
BUN: 25 mg/dL — ABNORMAL HIGH (ref 6–23)
CO2: 29 mEq/L (ref 19–32)
Calcium: 9.3 mg/dL (ref 8.4–10.5)
Chloride: 101 mEq/L (ref 96–112)
Creatinine, Ser: 0.94 mg/dL (ref 0.40–1.50)
GFR: 81.33 mL/min (ref >60.00)
Glucose, Bld: 104 mg/dL — ABNORMAL HIGH (ref 70–99)
Potassium: 3.9 mEq/L (ref 3.5–5.1)
Sodium: 139 mEq/L (ref 135–145)
Total Bilirubin: 0.4 mg/dL (ref 0.2–1.2)
Total Protein: 6.6 g/dL (ref 6.0–8.3)

## 2019-08-14 LAB — PSA: PSA: 0.11 ng/mL (ref 0.10–4.00)

## 2019-08-14 LAB — VITAMIN D 25 HYDROXY (VIT D DEFICIENCY, FRACTURES): VITD: 50.4 ng/mL (ref 30.00–100.00)

## 2019-08-15 ENCOUNTER — Other Ambulatory Visit: Payer: Self-pay | Admitting: Family Medicine

## 2019-08-20 ENCOUNTER — Other Ambulatory Visit: Payer: Self-pay

## 2019-08-20 ENCOUNTER — Ambulatory Visit (INDEPENDENT_AMBULATORY_CARE_PROVIDER_SITE_OTHER): Payer: BC Managed Care – PPO

## 2019-08-20 DIAGNOSIS — Z953 Presence of xenogenic heart valve: Secondary | ICD-10-CM | POA: Diagnosis not present

## 2019-08-20 MED ORDER — PERFLUTREN LIPID MICROSPHERE
1.0000 mL | INTRAVENOUS | Status: AC | PRN
  Administered 2019-08-20: 2 mL via INTRAVENOUS

## 2019-08-21 ENCOUNTER — Other Ambulatory Visit: Payer: Self-pay

## 2019-08-21 ENCOUNTER — Encounter: Payer: Self-pay | Admitting: Family Medicine

## 2019-08-21 ENCOUNTER — Ambulatory Visit (INDEPENDENT_AMBULATORY_CARE_PROVIDER_SITE_OTHER): Payer: BC Managed Care – PPO | Admitting: Family Medicine

## 2019-08-21 VITALS — BP 132/78 | HR 98 | Temp 97.5°F | Ht 68.0 in | Wt 234.4 lb

## 2019-08-21 DIAGNOSIS — E785 Hyperlipidemia, unspecified: Secondary | ICD-10-CM

## 2019-08-21 DIAGNOSIS — E559 Vitamin D deficiency, unspecified: Secondary | ICD-10-CM

## 2019-08-21 DIAGNOSIS — Z0000 Encounter for general adult medical examination without abnormal findings: Secondary | ICD-10-CM

## 2019-08-21 DIAGNOSIS — E1169 Type 2 diabetes mellitus with other specified complication: Secondary | ICD-10-CM | POA: Diagnosis not present

## 2019-08-21 DIAGNOSIS — N400 Enlarged prostate without lower urinary tract symptoms: Secondary | ICD-10-CM

## 2019-08-21 DIAGNOSIS — Z953 Presence of xenogenic heart valve: Secondary | ICD-10-CM

## 2019-08-21 DIAGNOSIS — U071 COVID-19: Secondary | ICD-10-CM

## 2019-08-21 DIAGNOSIS — K219 Gastro-esophageal reflux disease without esophagitis: Secondary | ICD-10-CM

## 2019-08-21 DIAGNOSIS — E119 Type 2 diabetes mellitus without complications: Secondary | ICD-10-CM

## 2019-08-21 DIAGNOSIS — Z1211 Encounter for screening for malignant neoplasm of colon: Secondary | ICD-10-CM

## 2019-08-21 DIAGNOSIS — K760 Fatty (change of) liver, not elsewhere classified: Secondary | ICD-10-CM

## 2019-08-21 DIAGNOSIS — J4520 Mild intermittent asthma, uncomplicated: Secondary | ICD-10-CM

## 2019-08-21 DIAGNOSIS — I10 Essential (primary) hypertension: Secondary | ICD-10-CM

## 2019-08-21 NOTE — Assessment & Plan Note (Addendum)
Weight gain noted. Encouraged continued walking routine.

## 2019-08-21 NOTE — Assessment & Plan Note (Signed)
Chronic, stable. Continue current regimen.  I also asked him to touch base with cards about need for metoprolol (only took for 2 months after valve replacement surgery)

## 2019-08-21 NOTE — Assessment & Plan Note (Signed)
Recovered well without noted sequelae to date.

## 2019-08-21 NOTE — Progress Notes (Signed)
This visit was conducted in person.  BP 132/78 (BP Location: Left Arm, Patient Position: Sitting, Cuff Size: Large)   Pulse 98   Temp (!) 97.5 F (36.4 C) (Temporal)   Ht 5\' 8"  (1.727 m)   Wt 234 lb 7 oz (106.3 kg)   SpO2 97%   BMI 35.65 kg/m    CC: CPE Subjective:    Patient ID: Erik Allen, male    DOB: [birth year], 64 y.o.   MRN: [id number]  HPI: Erik Allen is a 64 y.o. male presenting on 08/21/2019 for Annual Exam   Complicated year. Underwent minimally invasive aortic valve replacement with bioprosthetic valve 04/2019 for severe rheumatic aortic stenosis. Heart catheterization prior to surgery showed normal coronary arteries. Suffered COVID-19 infection s/p hospitalization, treated with IV remdesivir, decadron, vit C and zinc.  Known asthmatic on singulair, advair, PRN albuterol. Known controlled diabetic on metformin. Saw [facility] pulmonology Dr Lanney Gins post covid, treated with colchicine x2 mo.   Preventative: Colonoscopy - 2010 WNL, rpt due 10 yrs. Would like to defer colonoscopy to 2022. No blood in stool or bowel changes. IFOB today.  Prostate - yearly screen, normal. Not strong stream but "good stream", nocturia x1-3.Known BPH on proscar.  Flu shotyearly  Pneumovax 2014 Tdap 2016 COVID vaccine - pending pulm eval. Had COVID infection in 06/2019.  shingrix - discussed  Seat belt use discussed Sunscreen use discussed, nochangingmoleson skin.Uses UV protection shirts. Upcoming derm appt for chemical peel to face.  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 Interim medical history since our last  visit reviewed. Allergies and medications reviewed and updated. Outpatient Medications Prior to Visit  Medication Sig Dispense Refill  . albuterol (PROVENTIL) (2.5 MG/3ML) 0.083% nebulizer solution USE 1 VIAL VIA NEBULIZER EVERY 6 HOURS AS NEEDED (Patient taking differently: Take 2.5 mg by nebulization every 6 (six) hours as needed for wheezing or shortness of breath. USE 1 VIAL VIA NEBULIZER EVERY 6 HOURS AS NEEDE) 150 mL 3  . albuterol (VENTOLIN HFA) 108 (90 Base) MCG/ACT inhaler INHALE 2 PUFFS INTO THE LUNGS EVERY 6 HOURS AS NEEDED FOR WHEEZING OR SHORTNESS OF BREATH 18 g 4  . ascorbic acid (VITAMIN C) 500 MG tablet Take 1 tablet (500 mg total) by mouth daily. 30 tablet 0  . aspirin EC 325 MG EC tablet Take 1 tablet (325 mg total) by mouth daily. 30 tablet 0  . cholecalciferol (QC VITAMIN D3) 10 MCG (400 UNIT) TABS tablet Take 400 Units by mouth daily.    . cyanocobalamin (V-R VITAMIN B-12) 500 MCG tablet Take 1 tablet (500 mcg total) by mouth daily.    . finasteride (PROSCAR) 5 MG tablet TAKE 1 TABLET BY MOUTH EVERY DAY 90 tablet 1  . Fluticasone-Salmeterol (ADVAIR DISKUS) 250-50 MCG/DOSE AEPB INHALE 1 PUFF BY MOUTH TWICE DAILY. RINSE MOUTH AFTER USE 180 each 1  . metFORMIN (GLUCOPHAGE) 500 MG tablet TAKE 1 TABLET (500 MG TOTAL) BY MOUTH 2 (TWO) TIMES DAILY WITH A MEAL. 180 tablet 1  . montelukast (SINGULAIR) 10 MG tablet TAKE 1 TABLET BY MOUTH EVERY NIGHT AT BEDTIME (Patient taking differently: Take 10 mg by  mouth at bedtime. ) 90 tablet 2  . pantoprazole (PROTONIX) 40 MG tablet Take 40 mg by mouth at bedtime.    . simvastatin (ZOCOR) 80 MG tablet Take 80 mg by mouth at bedtime.    . valsartan-hydrochlorothiazide (DIOVAN-HCT) 160-25 MG tablet TAKE 1 TABLET BY MOUTH EVERY DAY 90 tablet 0  . zinc sulfate 220 (50 Zn) MG capsule Take 1 capsule (220 mg total) by mouth daily. 30 capsule 0  . metoprolol tartrate (LOPRESSOR) 100 MG tablet Take 1 tablet (100 mg total) by mouth 2 (two) times daily.  (Patient not taking: Reported on 08/21/2019) 30 tablet 0   No facility-administered medications prior to visit.     Per HPI unless specifically indicated in ROS section below Review of Systems  Constitutional: Negative for activity change, appetite change, chills, fatigue, fever and unexpected weight change.  HENT: Negative for hearing loss.   Eyes: Negative for visual disturbance.  Respiratory: Negative for cough, chest tightness, shortness of breath and wheezing.   Cardiovascular: Negative for chest pain, palpitations and leg swelling.  Gastrointestinal: Negative for abdominal distention, abdominal pain, blood in stool, constipation, diarrhea, nausea and vomiting.  Genitourinary: Negative for difficulty urinating and hematuria.  Musculoskeletal: Negative for arthralgias, myalgias and neck pain.  Skin: Negative for rash.  Neurological: Negative for dizziness, seizures, syncope and headaches.  Hematological: Negative for adenopathy. Does not bruise/bleed easily.  Psychiatric/Behavioral: Negative for dysphoric mood. The patient is not nervous/anxious.    Objective:  BP 132/78 (BP Location: Left Arm, Patient Position: Sitting, Cuff Size: Large)   Pulse 98   Temp (!) 97.5 F (36.4 C) (Temporal)   Ht 5\' 8"  (1.727 m)   Wt 234 lb 7 oz (106.3 kg)   SpO2 97%   BMI 35.65 kg/m   Wt Readings from Last 3 Encounters:  08/21/19 234 lb 7 oz (106.3 kg)  07/21/19 226 lb 6.4 oz (102.7 kg)  07/20/19 225 lb (102.1 kg)      Physical Exam Vitals and nursing note reviewed.  Constitutional:      General: He is not in acute distress.    Appearance: Normal appearance. He is well-developed. He is obese. He is not ill-appearing.  HENT:     Head: Normocephalic and atraumatic.     Right Ear: Hearing, tympanic membrane, ear canal and external ear normal.     Left Ear: Hearing, tympanic membrane, ear canal and external ear normal.     Nose: Nose normal.  Eyes:     General: No scleral icterus.     Conjunctiva/sclera: Conjunctivae normal.     Pupils: Pupils are equal, round, and reactive to light.  Neck:     Thyroid: No thyroid mass, thyromegaly or thyroid tenderness.     Vascular: Carotid bruit (presumed referred from systolic murmur) present.  Cardiovascular:     Rate and Rhythm: Normal rate and regular rhythm.     Pulses: Normal pulses.          Radial pulses are 2+ on the right side and 2+ on the left side.     Heart sounds: Murmur (2/6 systolic USB) heard.   Pulmonary:     Effort: Pulmonary effort is normal. No respiratory distress.     Breath sounds: Normal breath sounds. No wheezing, rhonchi or rales.  Abdominal:     General: Abdomen is flat. Bowel sounds are normal. There is no distension.     Palpations: Abdomen is soft. There is no mass.     Tenderness: There  is no abdominal tenderness. There is no guarding or rebound.     Hernia: No hernia is present.  Musculoskeletal:        General: Normal range of motion.     Cervical back: Normal range of motion and neck supple.     Right lower leg: No edema.     Left lower leg: No edema.  Lymphadenopathy:     Cervical: No cervical adenopathy.  Skin:    General: Skin is warm and dry.     Findings: No rash.  Neurological:     General: No focal deficit present.     Mental Status: He is alert and oriented to person, place, and time.     Comments: CN grossly intact, station and gait intact  Psychiatric:        Mood and Affect: Mood normal.        Behavior: Behavior normal.        Thought Content: Thought content normal.        Judgment: Judgment normal.       Results for orders placed or performed in visit on 08/14/19  PSA  Result Value Ref Range   PSA 0.11 0.10 - 4.00 ng/mL  VITAMIN D 25 Hydroxy (Vit-D Deficiency, Fractures)  Result Value Ref Range   VITD 50.40 30.00 - 100.00 ng/mL  Hemoglobin A1c  Result Value Ref Range   Hgb A1c MFr Bld 6.7 (H) 4.6 - 6.5 %  Comprehensive metabolic panel  Result Value Ref Range     Sodium 139 135 - 145 mEq/L   Potassium 3.9 3.5 - 5.1 mEq/L   Chloride 101 96 - 112 mEq/L   CO2 29 19 - 32 mEq/L   Glucose, Bld 104 (H) 70 - 99 mg/dL   BUN 25 (H) 6 - 23 mg/dL   Creatinine, Ser 0.94 0.40 - 1.50 mg/dL   Total Bilirubin 0.4 0.2 - 1.2 mg/dL   Alkaline Phosphatase 54 39 - 117 U/L   AST 28 0 - 37 U/L   ALT 44 0 - 53 U/L   Total Protein 6.6 6.0 - 8.3 g/dL   Albumin 4.3 3.5 - 5.2 g/dL   GFR 81.33 >60.00 mL/min   Calcium 9.3 8.4 - 10.5 mg/dL  Lipid panel  Result Value Ref Range   Cholesterol 155 0 - 200 mg/dL   Triglycerides 153.0 (H) 0 - 149 mg/dL   HDL 54.90 >39.00 mg/dL   VLDL 30.6 0.0 - 40.0 mg/dL   LDL Cholesterol 70 0 - 99 mg/dL   Total CHOL/HDL Ratio 3    NonHDL 100.30    Depression screen [facility] 2/9 08/21/2019 07/21/2019 05/26/2019 07/12/2017  Decreased Interest 0 0 0 0  Down, Depressed, Hopeless 0 0 0 0  PHQ - 2 Score 0 0 0 0  Altered sleeping 0 0 2 -  Tired, decreased energy 2 1 1  -  Change in appetite 0 0 0 -  Feeling bad or failure about yourself  0 0 0 -  Trouble concentrating 0 0 0 -  Moving slowly or fidgety/restless 0 0 0 -  Suicidal thoughts 0 0 0 -  PHQ-9 Score 2 1 3  -  Difficult doing work/chores - Not difficult at all Not difficult at all -  Some recent data might be hidden    GAD 7 : Generalized Anxiety Score 08/21/2019  Nervous, Anxious, on Edge 0  Control/stop worrying 0  Worry too much - different things 0  Trouble relaxing 0  Restless 0  Easily annoyed or irritable 0  Afraid - awful might happen 0  Total GAD 7 Score 0   Assessment & Plan:  This visit occurred during the SARS-CoV-2 public health emergency.  Safety protocols were in place, including screening questions prior to the visit, additional usage of staff PPE, and extensive cleaning of exam room while observing appropriate contact time as indicated for disinfecting solutions.   Problem List Items Addressed This Visit    Vitamin D deficiency    Chronic, stable. Continue current  regimen.       Severe obesity (BMI 35.0-39.9) with comorbidity ([place])    Weight gain noted. Encouraged continued walking routine.       S/P minimally invasive aortic valve replacement with bioprosthetic valve    Appreciate cards, surgery care.       Hyperlipidemia associated with type 2 diabetes mellitus (HCC)    Chronic, stable on high dose simvastatin. Cardiac catheterization earlier this year with normal CAs - suggested he touch base with cards on dosing of statin, may do well on lower dose.  The 10-year ASCVD risk score Mikey Bussing DC Brooke Bonito., et al., 2013) is: 16%   Values used to calculate the score:     Age: 49 years     Sex: Male     Is Non-Hispanic African American: No     Diabetic: Yes     Tobacco smoker: No     Systolic Blood Pressure: 096 mmHg     Is BP treated: Yes     HDL Cholesterol: 54.9 mg/dL     Total Cholesterol: 155 mg/dL       Healthcare maintenance - Primary    Preventative protocols reviewed and updated unless pt declined. Discussed healthy diet and lifestyle.       GERD    Continues daily PPI (pantoprazole) previously on dexilant. Regularly sees ENT.       Fatty liver disease, nonalcoholic    LFTs have normalized.       Essential hypertension    Chronic, stable. Continue current regimen.  I also asked him to touch base with cards about need for metoprolol (only took for 2 months after valve replacement surgery)      RESOLVED: COVID-19 virus infection    Recovered well without noted sequelae to date.       Controlled type 2 diabetes mellitus without complication, without long-term current use of insulin (HCC)    Chronic, stable. Continue current regimen.       Benign prostatic hyperplasia    Stable period on finasteride with low PSA.       Asthma    Stable period on singulair, advair, albuterol PRN.        Other Visit Diagnoses    Special screening for malignant neoplasms, colon       Relevant Orders   Fecal occult blood, imunochemical        No orders of the defined types were placed in this encounter.  Orders Placed This Encounter  Procedures  . Fecal occult blood, imunochemical    Standing Status:   Future    Standing Expiration Date:   08/20/2020    Patient instructions: Consider shingrix 2 shot shingles series  Check with cardiology about need for metoprolol and simvastatin dosing.  You are doing well today Return as needed or in 4 months for follow up visit.   Follow up plan: Return in about 4 months (around 12/22/2019) for follow up visit.  Ria Bush, MD

## 2019-08-21 NOTE — Assessment & Plan Note (Signed)
Continues daily PPI (pantoprazole) previously on dexilant. Regularly sees ENT.

## 2019-08-21 NOTE — Assessment & Plan Note (Signed)
Stable period on singulair, advair, albuterol PRN.

## 2019-08-21 NOTE — Assessment & Plan Note (Signed)
LFT's have normalized

## 2019-08-21 NOTE — Assessment & Plan Note (Signed)
Appreciate cards, surgery care.

## 2019-08-21 NOTE — Assessment & Plan Note (Signed)
Chronic, stable on high dose simvastatin. Cardiac catheterization earlier this year with normal CAs - suggested he touch base with cards on dosing of statin, may do well on lower dose.  The 10-year ASCVD risk score Erik Allen Erik Allen., et al., 2013) is: 16%   Values used to calculate the score:     Age: 64 years     Sex: Male     Is Non-Hispanic African American: No     Diabetic: Yes     Tobacco smoker: No     Systolic Blood Pressure: 619 mmHg     Is BP treated: Yes     HDL Cholesterol: 54.9 mg/dL     Total Cholesterol: 155 mg/dL

## 2019-08-21 NOTE — Assessment & Plan Note (Signed)
Chronic, stable. Continue current regimen. 

## 2019-08-21 NOTE — Assessment & Plan Note (Signed)
Preventative protocols reviewed and updated unless pt declined. Discussed healthy diet and lifestyle.  

## 2019-08-21 NOTE — Assessment & Plan Note (Signed)
Stable period on finasteride with low PSA.

## 2019-08-21 NOTE — Patient Instructions (Addendum)
Consider shingrix 2 shot shingles series  Check with cardiology about need for metoprolol and simvastatin dosing.  You are doing well today Return as needed or in 4 months for follow up visit.   Health Maintenance, Male Adopting a healthy lifestyle and getting preventive care are important in promoting health and wellness. Ask your health care provider about:  The right schedule for you to have regular tests and exams.  Things you can do on your own to prevent diseases and keep yourself healthy. What should I know about diet, weight, and exercise? Eat a healthy diet   Eat a diet that includes plenty of vegetables, fruits, low-fat dairy products, and lean protein.  Do not eat a lot of foods that are high in solid fats, added sugars, or sodium. Maintain a healthy weight Body mass index (BMI) is a measurement that can be used to identify possible weight problems. It estimates body fat based on height and weight. Your health care provider can help determine your BMI and help you achieve or maintain a healthy weight. Get regular exercise Get regular exercise. This is one of the most important things you can do for your health. Most adults should:  Exercise for at least 150 minutes each week. The exercise should increase your heart rate and make you sweat (moderate-intensity exercise).  Do strengthening exercises at least twice a week. This is in addition to the moderate-intensity exercise.  Spend less time sitting. Even light physical activity can be beneficial. Watch cholesterol and blood lipids Have your blood tested for lipids and cholesterol at 62 years of age, then have this test every 5 years. You may need to have your cholesterol levels checked more often if:  Your lipid or cholesterol levels are high.  You are older than 62 years of age.  You are at high risk for heart disease. What should I know about cancer screening? Many types of cancers can be detected early and may  often be prevented. Depending on your health history and family history, you may need to have cancer screening at various ages. This may include screening for:  Colorectal cancer.  Prostate cancer.  Skin cancer.  Lung cancer. What should I know about heart disease, diabetes, and high blood pressure? Blood pressure and heart disease  High blood pressure causes heart disease and increases the risk of stroke. This is more likely to develop in people who have high blood pressure readings, are of African descent, or are overweight.  Talk with your health care provider about your target blood pressure readings.  Have your blood pressure checked: ? Every 3-5 years if you are 40-4 years of age. ? Every year if you are 61 years old or older.  If you are between the ages of 43 and 31 and are a current or former smoker, ask your health care provider if you should have a one-time screening for abdominal aortic aneurysm (AAA). Diabetes Have regular diabetes screenings. This checks your fasting blood sugar level. Have the screening done:  Once every three years after age 42 if you are at a normal weight and have a low risk for diabetes.  More often and at a younger age if you are overweight or have a high risk for diabetes. What should I know about preventing infection? Hepatitis B If you have a higher risk for hepatitis B, you should be screened for this virus. Talk with your health care provider to find out if you are at risk for hepatitis  B infection. Hepatitis C Blood testing is recommended for:  Everyone born from [birth year] through 1963.  Anyone with known risk factors for hepatitis C. Sexually transmitted infections (STIs)  You should be screened each year for STIs, including gonorrhea and chlamydia, if: ? You are sexually active and are younger than 62 years of age. ? You are older than 62 years of age and your health care provider tells you that you are at risk for this type of  infection. ? Your sexual activity has changed since you were last screened, and you are at increased risk for chlamydia or gonorrhea. Ask your health care provider if you are at risk.  Ask your health care provider about whether you are at high risk for HIV. Your health care provider may recommend a prescription medicine to help prevent HIV infection. If you choose to take medicine to prevent HIV, you should first get tested for HIV. You should then be tested every 3 months for as long as you are taking the medicine. Follow these instructions at home: Lifestyle  Do not use any products that contain nicotine or tobacco, such as cigarettes, e-cigarettes, and chewing tobacco. If you need help quitting, ask your health care provider.  Do not use street drugs.  Do not share needles.  Ask your health care provider for help if you need support or information about quitting drugs. Alcohol use  Do not drink alcohol if your health care provider tells you not to drink.  If you drink alcohol: ? Limit how much you have to 0-2 drinks a day. ? Be aware of how much alcohol is in your drink. In the [place] one drink equals one 12 oz bottle of beer (355 mL), one 5 oz glass of wine (148 mL), or one 1 oz glass of hard liquor (44 mL). General instructions  Schedule regular health, dental, and eye exams.  Stay current with your vaccines.  Tell your health care provider if: ? You often feel depressed. ? You have ever been abused or do not feel safe at home. Summary  Adopting a healthy lifestyle and getting preventive care are important in promoting health and wellness.  Follow your health care provider's instructions about healthy diet, exercising, and getting tested or screened for diseases.  Follow your health care provider's instructions on monitoring your cholesterol and blood pressure. This information is not intended to replace advice given to you by your health care provider. Make sure you  discuss any questions you have with your health care provider. Document Revised: 01/22/2018 Document Reviewed: 01/22/2018 Elsevier Patient Education  2020 [employer].

## 2019-08-31 ENCOUNTER — Ambulatory Visit: Payer: BC Managed Care – PPO | Admitting: Cardiology

## 2019-09-10 ENCOUNTER — Other Ambulatory Visit: Payer: Self-pay | Admitting: Family Medicine

## 2019-09-10 DIAGNOSIS — E119 Type 2 diabetes mellitus without complications: Secondary | ICD-10-CM

## 2019-09-11 ENCOUNTER — Ambulatory Visit: Payer: BC Managed Care – PPO | Admitting: Family Medicine

## 2019-09-22 ENCOUNTER — Other Ambulatory Visit: Payer: Self-pay | Admitting: Family Medicine

## 2019-09-22 DIAGNOSIS — E119 Type 2 diabetes mellitus without complications: Secondary | ICD-10-CM

## 2019-09-22 NOTE — Telephone Encounter (Signed)
Spoke with pt asking if he still uses Accu-Chek Guide meter and strips.  Confirms he does and that he needs strips refilled.  Notified pt refills will be sent in.   E-scribed refill.

## 2019-10-12 ENCOUNTER — Other Ambulatory Visit: Payer: Self-pay | Admitting: Family Medicine

## 2019-10-14 ENCOUNTER — Other Ambulatory Visit: Payer: Self-pay | Admitting: Family Medicine

## 2019-11-07 ENCOUNTER — Other Ambulatory Visit: Payer: Self-pay | Admitting: Family Medicine

## 2019-11-08 ENCOUNTER — Other Ambulatory Visit: Payer: Self-pay | Admitting: Family Medicine

## 2019-12-09 ENCOUNTER — Other Ambulatory Visit: Payer: Self-pay

## 2019-12-09 ENCOUNTER — Ambulatory Visit (INDEPENDENT_AMBULATORY_CARE_PROVIDER_SITE_OTHER): Payer: BC Managed Care – PPO | Admitting: Family Medicine

## 2019-12-09 ENCOUNTER — Encounter: Payer: Self-pay | Admitting: Family Medicine

## 2019-12-09 VITALS — BP 134/80 | HR 99 | Temp 97.7°F | Ht 68.0 in | Wt 240.0 lb

## 2019-12-09 DIAGNOSIS — H938X1 Other specified disorders of right ear: Secondary | ICD-10-CM | POA: Insufficient documentation

## 2019-12-09 DIAGNOSIS — E119 Type 2 diabetes mellitus without complications: Secondary | ICD-10-CM | POA: Diagnosis not present

## 2019-12-09 LAB — POCT GLYCOSYLATED HEMOGLOBIN (HGB A1C): Hemoglobin A1C: 6.3 % — AB (ref 4.0–5.6)

## 2019-12-09 NOTE — Progress Notes (Signed)
This visit was conducted in person.  BP 134/80 (BP Location: Left Arm, Patient Position: Sitting, Cuff Size: Large)    Pulse 99    Temp 97.7 F (36.5 C) (Temporal)    Ht 5\' 8"  (1.727 m)    Wt 240 lb (108.9 kg)    SpO2 96%    BMI 36.49 kg/m    CC: 4 mo f/u visit  Subjective:    Patient ID: Erik Allen, male    DOB: 05-Feb-1956, 64 y.o.   MRN: [id number]  HPI: Erik Allen is a 64 y.o. male presenting on 12/09/2019 for Follow-up (Here for 4 mo f/u.)   Planning retirement at end of year  R ear with muffled hearing for 1+ wk - no dizziness or pain, fevers/chills, congestion.   DM - does regularly check sugars daily - 100-120 fasting. Compliant with antihyperglycemic regimen which includes: metformin 500mg  bid. Denies low sugars or hypoglycemic symptoms. Denies paresthesias. Last diabetic eye exam 07/2019. Pneumovax: 2014. Prevnar: not due. Glucometer brand: accuchek. DSME: declines. Lab Results  Component Value Date   HGBA1C 6.3 (A) 12/09/2019   Diabetic Foot Exam - Simple   Simple Foot Form Diabetic Foot exam was performed with the following findings: Yes 12/09/2019  3:27 PM  Visual Inspection No deformities, no ulcerations, no other skin breakdown bilaterally: Yes Sensation Testing Intact to touch and monofilament testing bilaterally: Yes Pulse Check Posterior Tibialis and Dorsalis pulse intact bilaterally: Yes Comments    Lab Results  Component Value Date   MICROALBUR 2.6 (H) 07/09/2017        Relevant past medical, surgical, family and social history reviewed and updated as indicated. Interim medical history since our last visit reviewed. Allergies and medications reviewed and updated. Outpatient Medications Prior to Visit  Medication Sig Dispense Refill   Accu-Chek FastClix Lancets MISC USE AS DIRECTED TO CHECK BLOOD SUGAR DAILY 102 each 3   ACCU-CHEK GUIDE test strip USE AS INSTRUCTED 100 strip 1   albuterol (PROVENTIL) (2.5 MG/3ML) 0.083% nebulizer  solution USE 1 VIAL VIA NEBULIZER EVERY 6 HOURS AS NEEDED (Patient taking differently: Take 2.5 mg by nebulization every 6 (six) hours as needed for wheezing or shortness of breath. USE 1 VIAL VIA NEBULIZER EVERY 6 HOURS AS NEEDE) 150 mL 3   albuterol (VENTOLIN HFA) 108 (90 Base) MCG/ACT inhaler INHALE 2 PUFFS INTO THE LUNGS EVERY 6 HOURS AS NEEDED FOR WHEEZING OR SHORTNESS OF BREATH 18 g 4   ascorbic acid (VITAMIN C) 500 MG tablet Take 1 tablet (500 mg total) by mouth daily. 30 tablet 0   aspirin EC 325 MG EC tablet Take 1 tablet (325 mg total) by mouth daily. 30 tablet 0   cholecalciferol (QC VITAMIN D3) 10 MCG (400 UNIT) TABS tablet Take 400 Units by mouth daily.     cyanocobalamin (V-R VITAMIN B-12) 500 MCG tablet Take 1 tablet (500 mcg total) by mouth daily.     finasteride (PROSCAR) 5 MG tablet TAKE 1 TABLET BY MOUTH EVERY DAY 90 tablet 0   Fluticasone-Salmeterol (ADVAIR DISKUS) 250-50 MCG/DOSE AEPB INHALE 1 PUFF BY MOUTH TWICE DAILY. RINSE MOUTH AFTER USE 180 each 1   metFORMIN (GLUCOPHAGE) 500 MG tablet TAKE 1 TABLET (500 MG TOTAL) BY MOUTH 2 (TWO) TIMES DAILY WITH A MEAL. 180 tablet 1   montelukast (SINGULAIR) 10 MG tablet TAKE 1 TABLET BY MOUTH EVERY NIGHT AT BEDTIME 90 tablet 3   pantoprazole (PROTONIX) 40 MG tablet Take 40 mg by mouth at bedtime.  simvastatin (ZOCOR) 80 MG tablet TAKE 1 TABLET BY MOUTH EVERYDAY AT BEDTIME 90 tablet 3   valsartan-hydrochlorothiazide (DIOVAN-HCT) 160-25 MG tablet TAKE 1 TABLET BY MOUTH EVERY DAY 90 tablet 0   zinc sulfate 220 (50 Zn) MG capsule Take 1 capsule (220 mg total) by mouth daily. 30 capsule 0   metoprolol tartrate (LOPRESSOR) 100 MG tablet Take 1 tablet (100 mg total) by mouth 2 (two) times daily. (Patient not taking: Reported on 08/21/2019) 30 tablet 0   No facility-administered medications prior to visit.     Per HPI unless specifically indicated in ROS section below Review of Systems Objective:  BP 134/80 (BP Location: Left  Arm, Patient Position: Sitting, Cuff Size: Large)    Pulse 99    Temp 97.7 F (36.5 C) (Temporal)    Ht 5\' 8"  (1.727 m)    Wt 240 lb (108.9 kg)    SpO2 96%    BMI 36.49 kg/m   Wt Readings from Last 3 Encounters:  12/09/19 240 lb (108.9 kg)  08/21/19 234 lb 7 oz (106.3 kg)  07/21/19 226 lb 6.4 oz (102.7 kg)      Physical Exam Vitals and nursing note reviewed.  Constitutional:      General: He is not in acute distress.    Appearance: Normal appearance. He is well-developed. He is obese. He is not ill-appearing.  HENT:     Head: Normocephalic and atraumatic.     Right Ear: Tympanic membrane, ear canal and external ear normal. There is no impacted cerumen.     Left Ear: Tympanic membrane, ear canal and external ear normal. There is no impacted cerumen.     Ears:     Comments: Hard piece of wax removed from R ear canal Eyes:     General: No scleral icterus.    Extraocular Movements: Extraocular movements intact.     Conjunctiva/sclera: Conjunctivae normal.     Pupils: Pupils are equal, round, and reactive to light.  Cardiovascular:     Rate and Rhythm: Normal rate and regular rhythm.     Pulses: Normal pulses.     Heart sounds: Murmur (2/6 systolic) heard.   Pulmonary:     Effort: Pulmonary effort is normal. No respiratory distress.     Breath sounds: Normal breath sounds. No wheezing, rhonchi or rales.  Musculoskeletal:     Cervical back: Normal range of motion and neck supple.     Right lower leg: No edema.     Left lower leg: No edema.     Comments: See HPI for foot exam if done  Lymphadenopathy:     Cervical: No cervical adenopathy.  Skin:    General: Skin is warm and dry.     Findings: No rash.  Neurological:     Mental Status: He is alert.  Psychiatric:        Mood and Affect: Mood normal.        Behavior: Behavior normal.       Results for orders placed or performed in visit on 12/09/19  POCT glycosylated hemoglobin (Hb A1C)  Result Value Ref Range   Hemoglobin  A1C 6.3 (A) 4.0 - 5.6 %   HbA1c POC (<> result, manual entry)     HbA1c, POC (prediabetic range)     HbA1c, POC (controlled diabetic range)     Assessment & Plan:  This visit occurred during the SARS-CoV-2 public health emergency.  Safety protocols were in place, including screening questions prior to the visit,  additional usage of staff PPE, and extensive cleaning of exam room while observing appropriate contact time as indicated for disinfecting solutions.   Problem List Items Addressed This Visit    Ear fullness, right    Benign exam. Hard wax removed from canal. Suggested flonase trial if ongoing symptoms.       Controlled type 2 diabetes mellitus without complication, without long-term current use of insulin (HCC) - Primary    Chronic, stable on current regimen. Foot exam today.       Relevant Orders   POCT glycosylated hemoglobin (Hb A1C) (Completed)       No orders of the defined types were placed in this encounter.  Orders Placed This Encounter  Procedures   POCT glycosylated hemoglobin (Hb A1C)    Patient Instructions  If ongoing muffled hearing - may try nasal saline like flonase over the counter.  You are doing well today - continue current medicines Return as needed or in 6 months for follow up visit.   Follow up plan: Return in about 6 months (around 06/08/2020) for follow up visit.  Ria Bush, MD

## 2019-12-09 NOTE — Assessment & Plan Note (Addendum)
Chronic, stable on current regimen. Foot exam today.

## 2019-12-09 NOTE — Assessment & Plan Note (Signed)
Benign exam. Hard wax removed from canal. Suggested flonase trial if ongoing symptoms.

## 2019-12-09 NOTE — Patient Instructions (Addendum)
If ongoing muffled hearing - may try nasal saline like flonase over the counter.  You are doing well today - continue current medicines Return as needed or in 6 months for follow up visit.

## 2019-12-21 ENCOUNTER — Ambulatory Visit: Payer: BC Managed Care – PPO | Admitting: Family Medicine

## 2020-01-02 ENCOUNTER — Other Ambulatory Visit: Payer: Self-pay | Admitting: Family Medicine

## 2020-01-11 ENCOUNTER — Other Ambulatory Visit: Payer: Self-pay

## 2020-01-11 ENCOUNTER — Encounter: Payer: Self-pay | Admitting: Cardiology

## 2020-01-11 ENCOUNTER — Ambulatory Visit (INDEPENDENT_AMBULATORY_CARE_PROVIDER_SITE_OTHER): Payer: BC Managed Care – PPO | Admitting: Cardiology

## 2020-01-11 VITALS — BP 140/82 | HR 98 | Ht 70.0 in | Wt 242.0 lb

## 2020-01-11 DIAGNOSIS — E7800 Pure hypercholesterolemia, unspecified: Secondary | ICD-10-CM

## 2020-01-11 DIAGNOSIS — I10 Essential (primary) hypertension: Secondary | ICD-10-CM

## 2020-01-11 DIAGNOSIS — Z953 Presence of xenogenic heart valve: Secondary | ICD-10-CM | POA: Diagnosis not present

## 2020-01-11 MED ORDER — ASPIRIN 81 MG PO TBEC: 81.0000 mg | DELAYED_RELEASE_TABLET | Freq: Every day | ORAL | 0 refills | Status: AC

## 2020-01-11 NOTE — Patient Instructions (Signed)
Medication Instructions:    DECREASE your Aspirin to 81 MG one tablet by mouth daily.   *If you need a refill on your cardiac medications before your next appointment, please call your pharmacy*   Lab Work: None Ordered If you have labs (blood work) drawn today and your tests are completely normal, you will receive your results only by: Marland Kitchen MyChart Message (if you have MyChart) OR . A paper copy in the mail If you have any lab test that is abnormal or we need to change your treatment, we will call you to review the results.   Testing/Procedures: None Ordered   Follow-Up: At [facility], you and your health needs are our priority.  As part of our continuing mission to provide you with exceptional heart care, we have created designated Provider Care Teams.  These Care Teams include your primary Cardiologist (physician) and Advanced Practice Providers (APPs -  Physician Assistants and Nurse Practitioners) who all work together to provide you with the care you need, when you need it.  We recommend signing up for the patient portal called "MyChart".  Sign up information is provided on this After Visit Summary.  MyChart is used to connect with patients for Virtual Visits (Telemedicine).  Patients are able to view lab/test results, encounter notes, upcoming appointments, etc.  Non-urgent messages can be sent to your provider as well.   To learn more about what you can do with MyChart, go to [email or web address].    Your next appointment:   1 year(s)  The format for your next appointment:   In Person  Provider:   Kate Sable, MD   Other Instructions

## 2020-01-11 NOTE — Progress Notes (Signed)
Cardiology Office Note:    Date:  01/11/2020   ID:  [name], DOB 02/22/1955, MRN [id number]  PCP:  Ria Bush, MD  Cardiologist:  Kate Sable, MD  Electrophysiologist:  None   Referring MD: Ria Bush, MD   Chief Complaint  Patient presents with  . other    6 month follow up. Meds reviewed by the pt. verbally. "doing well."      History of Present Illness:    Erik Allen is a 64 y.o. male with a hx of aortic stenosis s/p aortic valve replacement on 04/2019, hypertension, hyperlipidemia who presents for follow-up.   Echocardiogram obtained on 08/20/2019 to evaluate aortic valve prosthesis post aortic valve replacement.  Patient has no postop complications, doing well.  Has no concerns at this time, take medications as prescribed.  Historical notes Patient was last seen due to aortic stenosis.  Echocardiogram did reveal severe aortic valve calcification with severe aortic stenosis.  He was referred to cardiac surgery for evaluation and management.  He underwent a minimally invasive aortic valve replacement with a bioprosthetic valve on 04/29/2019.  Postoperative course has been uneventful.    Past Medical History:  Diagnosis Date  . Aortic stenosis 06/21/2014   Echo 01/2018: Mild LVH, normal sys fxn EF 55-60%, G1DD, moderate AS with mildly dilated LA. rec yearly echocardiogram  . Asthma 09/13/99  . BENIGN PROSTATIC HYPERTROPHY 05/14/2003  . CAP (community acquired pneumonia) 01/31/2015  . COVID-19 virus infection 06/2019  . Diabetes mellitus without complication ([place])   . Disorder of vocal cord 2012   h/o thrush on vocal cords per patient  . GERD (gastroesophageal reflux disease) 10/13/01   ENT eval for GERD and thrush 2011  . History of kidney stones   . HTN (hypertension)   . Hyperlipemia 10/13/97  . S/P minimally invasive aortic valve replacement with bioprosthetic valve 04/29/2019   Edwards Inspiris Resilia stented bovine pericardial tissue valve  (size 23 mm)  . Seasonal allergic rhinitis    worse in spring  . Systolic murmur   . Transaminitis 2014   presumed fatty liver without [place], normal iron and viral hep panel    Past Surgical History:  Procedure Laterality Date  . A1 pulley release  10/2010   stensosing tenosynovitis, R milddle, ring, little fingers  . AORTIC VALVE REPLACEMENT N/A 04/29/2019   Procedure: MINIMALLY INVASIVE AORTIC VALVE REPLACEMENT (AVR) USING INSPIRIS VALVE SIZE 23MM;  Surgeon: Rexene Alberts, MD;  Location: [place];  Service: Open Heart Surgery;  Laterality: N/A;  . COLONOSCOPY  2010   WNL, rpt due 10 yrs  . CYSTECTOMY     from back  . CYSTOSCOPY  08/02   Stone retrieval  . EAR CYST EXCISION Left 08/02/2016   Procedure: EXCISION MUCOID CYST LEFT MIDDLE FINGER WITH DISTAL INTERPHALANGEAL JOINT [place];  Surgeon: Daryll Brod, MD;  Location: [place];  Service: Orthopedics;  Laterality: Left;  . FOOT CAPSULE RELEASE W/ PERCUTANEOUS HEEL CORD LENGTHENING, TIBIAL TENDON TRANSFER  04/09   bilateral, Dr. Milinda Pointer  . INGUINAL HERNIA REPAIR  06/28/09   left, with mesh, Dr. Bary Castilla  . RIGHT/LEFT HEART CATH AND CORONARY ANGIOGRAPHY Bilateral 04/06/2019   Procedure: RIGHT/LEFT HEART CATH AND CORONARY ANGIOGRAPHY;  Surgeon: Wellington Hampshire, MD;  Location: [facility];  Service: Cardiovascular;  Laterality: Bilateral;  . ROTATION FLAP Left 08/02/2016   Procedure: ROTATION DORSAL FLAP;  Surgeon: Daryll Brod, MD;  Location: [place];  Service: Orthopedics;  Laterality: Left;  . TEE WITHOUT CARDIOVERSION N/A 04/29/2019   Procedure: TRANSESOPHAGEAL ECHOCARDIOGRAM (TEE);  Surgeon: Rexene Alberts, MD;  Location: [place];  Service: Open Heart Surgery;  Laterality: N/A;  . US ECHOCARDIOGRAPHY  09/01   TR, MR    Current Medications: Current Meds  Medication Sig  . Accu-Chek FastClix Lancets MISC USE AS DIRECTED TO CHECK BLOOD SUGAR DAILY  . ACCU-CHEK GUIDE test strip USE AS  INSTRUCTED  . albuterol (PROVENTIL) (2.5 MG/3ML) 0.083% nebulizer solution USE 1 VIAL VIA NEBULIZER EVERY 6 HOURS AS NEEDED (Patient taking differently: Take 2.5 mg by nebulization every 6 (six) hours as needed for wheezing or shortness of breath. USE 1 VIAL VIA NEBULIZER EVERY 6 HOURS AS NEEDE)  . albuterol (VENTOLIN HFA) 108 (90 Base) MCG/ACT inhaler INHALE 2 PUFFS INTO THE LUNGS EVERY 6 HOURS AS NEEDED FOR WHEEZING OR SHORTNESS OF BREATH  . amoxicillin-clavulanate (AUGMENTIN) 875-125 MG tablet Take 1 tablet by mouth 2 (two) times daily.  Marland Kitchen ascorbic acid (VITAMIN C) 500 MG tablet Take 1 tablet (500 mg total) by mouth daily.  Marland Kitchen aspirin 81 MG EC tablet Take 1 tablet (81 mg total) by mouth daily.  . cholecalciferol (QC VITAMIN D3) 10 MCG (400 UNIT) TABS tablet Take 400 Units by mouth daily.  . cyanocobalamin (V-R VITAMIN B-12) 500 MCG tablet Take 1 tablet (500 mcg total) by mouth daily.  . finasteride (PROSCAR) 5 MG tablet TAKE 1 TABLET BY MOUTH EVERY DAY  . Fluticasone-Salmeterol (ADVAIR DISKUS) 250-50 MCG/DOSE AEPB INHALE 1 PUFF BY MOUTH TWICE DAILY. RINSE MOUTH AFTER USE  . metFORMIN (GLUCOPHAGE) 500 MG tablet TAKE 1 TABLET (500 MG TOTAL) BY MOUTH 2 (TWO) TIMES DAILY WITH A MEAL.  . montelukast (SINGULAIR) 10 MG tablet TAKE 1 TABLET BY MOUTH EVERY NIGHT AT BEDTIME  . pantoprazole (PROTONIX) 40 MG tablet Take 40 mg by mouth at bedtime.  . simvastatin (ZOCOR) 80 MG tablet TAKE 1 TABLET BY MOUTH EVERYDAY AT BEDTIME  . valsartan-hydrochlorothiazide (DIOVAN-HCT) 160-25 MG tablet TAKE 1 TABLET BY MOUTH EVERY DAY  . zinc sulfate 220 (50 Zn) MG capsule Take 1 capsule (220 mg total) by mouth daily.  . [DISCONTINUED] aspirin EC 325 MG EC tablet Take 1 tablet (325 mg total) by mouth daily.     Allergies:   Ace inhibitors, Codeine, and Tramadol   Social History   Socioeconomic History  . Marital status: Single    Spouse name: Not on file  . Number of children: Not on file  . Years of education: Not  on file  . Highest education level: Not on file  Occupational History  . Occupation: TEAM LEADER    Employer: [employer]    Comment: Sheet metal fabrication  Tobacco Use  . Smoking status: Never Smoker  . Smokeless tobacco: Never Used  Vaping Use  . Vaping Use: Never used  Substance and Sexual Activity  . Alcohol use: Yes    Alcohol/week: 1.0 - 2.0 standard drink    Types: 1 - 2 Standard drinks or equivalent per week    Comment: occassionally  . Drug use: No  . Sexual activity: Yes  Other Topics Concern  . 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 Difficulty of Paying Living Expenses: Not on file  Food Insecurity:   . Worried About Charity fundraiser in the Last Year: Not on file  . Ran Out of Food in the Last Year: Not on file  Transportation Needs:   . Lack of Transportation (Medical): Not on file  . Lack of Transportation (Non-Medical): Not on file  Physical Activity:   . Days of Exercise per Week: Not on file  . Minutes of Exercise per Session: Not on file  Stress:   . Feeling of Stress : Not on file  Social Connections:   . Frequency of Communication with Friends and Family: Not on file  . Frequency of Social Gatherings with Friends and Family: Not on file  . Attends Religious Services: Not on file  . Active Member of Clubs or Organizations: Not on file  . Attends Archivist Meetings: Not on file  . Marital Status: Not on file     Family History: The patient's family history includes Cancer (age of onset: 59) in his father; Hypertension in his brother, father, and mother.  ROS:   Please see the history of present illness.     All  other systems reviewed and are negative.  EKGs/Labs/Other Studies Reviewed:    The following studies were reviewed today:   EKG:  EKG is  ordered today.  The ekg ordered today demonstrates normal sinus rhythm  Recent Labs: 06/22/2019: B Natriuretic Peptide 59.0 06/23/2019: Magnesium 2.2 06/26/2019: Hemoglobin 12.8; Platelets 283 08/14/2019: ALT 44; BUN 25; Creatinine, Ser 0.94; Potassium 3.9; Sodium 139  Recent Lipid Panel    Component Value Date/Time   CHOL 155 08/14/2019 0753   TRIG 153.0 (H) 08/14/2019 0753   HDL 54.90 08/14/2019 0753   CHOLHDL 3 08/14/2019 0753   VLDL 30.6 08/14/2019 0753   LDLCALC 70 08/14/2019 0753   LDLDIRECT 94.0 07/09/2017 0739    Physical Exam:    VS:  BP 140/82 (BP Location: Left Arm, Patient Position: Sitting, Cuff Size: Large)   Pulse 98   Ht 5\' 10"  (1.778 m)   Wt 242 lb (109.8 kg)   SpO2 98%   BMI 34.72 kg/m     Wt Readings from Last 3 Encounters:  01/11/20 242 lb (109.8 kg)  12/09/19 240 lb (108.9 kg)  08/21/19 234 lb 7 oz (106.3 kg)     GEN:  Well nourished, well developed in no acute distress HEENT: Normal NECK: No JVD; No carotid bruits LYMPHATICS: No lymphadenopathy CARDIAC: RRR, 1/6 systolic murmur, rusb, no gallops RESPIRATORY:  Clear to auscultation without rales, wheezing or rhonchi  ABDOMEN: Soft, non-tender, non-distended MUSCULOSKELETAL:  No edema; No deformity  SKIN: Warm and dry NEUROLOGIC:  Alert and oriented x 3 PSYCHIATRIC:  Normal affect   ASSESSMENT:    1. S/P minimally invasive aortic valve replacement with bioprosthetic valve   2. Essential hypertension   3. Pure hypercholesterolemia    PLAN:    In order of problems listed above:  1. history of severe aortic stenosis status post aortic valve replacement with a bioprosthetic valve on 04/2019, minimally invasive approach.  Postop course has been unremarkable.  Echo 08/20/2019 showed normal systolic function, EF 60 to 65%, impaired relaxation, normal functioning  aortic valve prosthesis.  Can reduce aspirin to 81 mg daily. 2. Blood pressure well controlled.  Continue valsartan, HCTZ. 3. History of hyperlipidemia, continue simvastatin.  Follow-up in 12 months   Medication Adjustments/Labs  and Tests Ordered: Current medicines are reviewed at length with the patient today.  Concerns regarding medicines are outlined above.  Orders Placed This Encounter  Procedures  . EKG 12-Lead   Meds ordered this encounter  Medications  . aspirin 81 MG EC tablet    Sig: Take 1 tablet (81 mg total) by mouth daily.    Dispense:  30 tablet    Refill:  0    Patient Instructions  Medication Instructions:    DECREASE your Aspirin to 81 MG one tablet by mouth daily.   *If you need a refill on your cardiac medications before your next appointment, please call your pharmacy*   Lab Work: None Ordered If you have labs (blood work) drawn today and your tests are completely normal, you will receive your results only by: Marland Kitchen MyChart Message (if you have MyChart) OR . A paper copy in the mail If you have any lab test that is abnormal or we need to change your treatment, we will call you to review the results.   Testing/Procedures: None Ordered   Follow-Up: At [facility], you and your health needs are our priority.  As part of our continuing mission to provide you with exceptional heart care, we have created designated Provider Care Teams.  These Care Teams include your primary Cardiologist (physician) and Advanced Practice Providers (APPs -  Physician Assistants and Nurse Practitioners) who all work together to provide you with the care you need, when you need it.  We recommend signing up for the patient portal called "MyChart".  Sign up information is provided on this After Visit Summary.  MyChart is used to connect with patients for Virtual Visits (Telemedicine).  Patients are able to view lab/test results, encounter notes, upcoming appointments, etc.   Non-urgent messages can be sent to your provider as well.   To learn more about what you can do with MyChart, go to [email or web address].    Your next appointment:   1 year(s)  The format for your next appointment:   In Person  Provider:   Kate Sable, MD   Other Instructions      Signed, Kate Sable, MD  01/11/2020 12:28 PM    [place]

## 2020-01-26 ENCOUNTER — Other Ambulatory Visit: Payer: Self-pay | Admitting: Family Medicine

## 2020-01-30 ENCOUNTER — Other Ambulatory Visit: Payer: Self-pay | Admitting: Family Medicine

## 2020-02-01 NOTE — Telephone Encounter (Signed)
Pharmacy requests refill on: Valsartan-HCTZ 160-25 mg   LAST REFILL: 11/07/2019 (Q-90, R-0) LAST OV: 12/09/2019 NEXT OV: 06/10/2020 PHARMACY: [id number] [place], [place]

## 2020-03-12 ENCOUNTER — Other Ambulatory Visit: Payer: Self-pay | Admitting: Family Medicine

## 2020-03-12 DIAGNOSIS — E119 Type 2 diabetes mellitus without complications: Secondary | ICD-10-CM

## 2020-03-14 ENCOUNTER — Other Ambulatory Visit: Payer: Self-pay | Admitting: Family Medicine

## 2020-03-14 DIAGNOSIS — E119 Type 2 diabetes mellitus without complications: Secondary | ICD-10-CM

## 2020-03-14 NOTE — Telephone Encounter (Signed)
Pharmacy requests refill on: Accu-Chek Guide Strip   LAST REFILL: 09/22/2019 (Q-100, R-1) LAST OV: 12/09/2019 NEXT OV: 06/10/2020 PHARMACY: [id number] [place],  Hgb A1C (12/09/2019): 6.3

## 2020-04-04 ENCOUNTER — Other Ambulatory Visit: Payer: Self-pay | Admitting: Family Medicine

## 2020-04-18 ENCOUNTER — Encounter: Payer: Self-pay | Admitting: Thoracic Surgery (Cardiothoracic Vascular Surgery)

## 2020-04-18 ENCOUNTER — Ambulatory Visit (INDEPENDENT_AMBULATORY_CARE_PROVIDER_SITE_OTHER): Payer: 59 | Admitting: Thoracic Surgery (Cardiothoracic Vascular Surgery)

## 2020-04-18 ENCOUNTER — Other Ambulatory Visit: Payer: Self-pay

## 2020-04-18 VITALS — BP 157/81 | HR 98 | Temp 98.2°F | Resp 20 | Wt 242.4 lb

## 2020-04-18 DIAGNOSIS — Z953 Presence of xenogenic heart valve: Secondary | ICD-10-CM | POA: Diagnosis not present

## 2020-04-18 NOTE — Progress Notes (Signed)
[address]       [name] [address]             [phone]     CARDIOTHORACIC SURGERY OFFICE NOTE  Primary Cardiologist is Kate Sable, MD PCP is Ria Bush, MD   HPI:  Patient is a 65 year old obese white male with history of aortic stenosis, hypertension, hyperlipidemia, insulin-dependent type 2 diabetes mellitus, asthma, and GE reflux disease who returns to the office today for routine follow-up status post aortic valve replacement using a stented bioprosthetic tissue valve via right anterior minithoracotomy on April 29, 2019.  His early postoperative recovery was uneventful and he was last seen here in our office on July 20 2019 at which time he was doing well.  Routine follow-up echocardiogram performed August 20 2019 revealed normal left ventricular systolic function with ejection fraction estimated 60 to 65%.  There was a bioprosthetic tissue valve in the aortic position that was functioning normally.  Mean transvalvular gradient was estimated 13 mmHg.  Patient returns to our office today for routine follow-up and reports that he is doing well.  He states that he never completely recovered after getting Covid, but otherwise he has had no problems.  He is back to normal activity and he states that he gets short of breath only with more strenuous physical exertion.  He is never had any chest pain or chest tightness and he never had any significant pain related to his surgical incision.  He is very pleased with his outcome.   Current Outpatient Medications  Medication Sig Dispense Refill  . Accu-Chek FastClix Lancets MISC USE AS DIRECTED TO CHECK BLOOD SUGAR DAILY 102 each 3  . ACCU-CHEK GUIDE test strip USE AS INSTRUCTED 100 strip 1  . albuterol (PROVENTIL) (2.5 MG/3ML) 0.083% nebulizer solution USE 1 VIAL VIA NEBULIZER EVERY 6 HOURS AS NEEDED (Patient taking differently: Take 2.5 mg by nebulization every 6 (six) hours as needed for wheezing or shortness  of breath. USE 1 VIAL VIA NEBULIZER EVERY 6 HOURS AS NEEDE) 150 mL 3  . albuterol (VENTOLIN HFA) 108 (90 Base) MCG/ACT inhaler INHALE 2 PUFFS INTO THE LUNGS EVERY 6 HOURS AS NEEDED FOR WHEEZING OR SHORTNESS OF BREATH 18 g 4  . ascorbic acid (VITAMIN C) 500 MG tablet Take 1 tablet (500 mg total) by mouth daily. 30 tablet 0  . aspirin 81 MG EC tablet Take 1 tablet (81 mg total) by mouth daily. 30 tablet 0  . cholecalciferol (VITAMIN D3) 10 MCG (400 UNIT) TABS tablet Take 400 Units by mouth daily.    . cyanocobalamin (V-R VITAMIN B-12) 500 MCG tablet Take 1 tablet (500 mcg total) by mouth daily.    . finasteride (PROSCAR) 5 MG tablet TAKE 1 TABLET BY MOUTH EVERY DAY 90 tablet 2  . Fluticasone-Salmeterol (ADVAIR DISKUS) 250-50 MCG/DOSE AEPB INHALE 1 PUFF BY MOUTH TWICE DAILY. RINSE MOUTH AFTER USE 180 each 1  . metFORMIN (GLUCOPHAGE) 500 MG tablet TAKE 1 TABLET (500 MG TOTAL) BY MOUTH 2 (TWO) TIMES DAILY WITH A MEAL. 180 tablet 2  . montelukast (SINGULAIR) 10 MG tablet TAKE 1 TABLET BY MOUTH EVERY NIGHT AT BEDTIME 90 tablet 3  . pantoprazole (PROTONIX) 40 MG tablet Take 40 mg by mouth at bedtime.    . simvastatin (ZOCOR) 80 MG tablet TAKE 1 TABLET BY MOUTH EVERYDAY AT BEDTIME 90 tablet 3  . valsartan-hydrochlorothiazide (DIOVAN-HCT) 160-25 MG tablet TAKE 1 TABLET BY MOUTH EVERY DAY 90 tablet 1  .  zinc sulfate 220 (50 Zn) MG capsule Take 1 capsule (220 mg total) by mouth daily. 30 capsule 0   No current facility-administered medications for this visit.      Physical Exam:   BP (!) 157/81 (BP Location: Right Arm, Patient Position: Sitting, Cuff Size: Large)   Pulse 98   Temp 98.2 F (36.8 C) (Skin)   Resp 20   Wt 242 lb 6.4 oz (110 kg)   SpO2 94% Comment: RA  BMI 34.78 kg/m   General:  Well-appearing  Chest:   Clear to auscultation  CV:   Regular rate and rhythm without murmur  Incisions:  Completely healed  Abdomen:  Soft nontender  Extremities:  Warm and well-perfused  Diagnostic  Tests:  ECHOCARDIOGRAM REPORT       Patient Name:  Erik Allen Date of Exam: 08/20/2019  Medical Rec #: [id number]    Height:    69.1 in  Accession #:  [id number]   Weight:    226.4 lb  Date of Birth: 1955-04-22    BSA:     2.180 m  Patient Age:  43 years    BP:      148/88 mmHg  Patient Gender: M        HR:      95 bpm.  Exam Location: [facility]   Procedure: 2D Echo, Cardiac Doppler, Color Doppler and Intracardiac       Opacification Agent   Indications:  I35.8 Other nonrheumatic aortic valve disorders    History:    Patient has prior history of Echocardiogram examinations,  most         recent 03/13/2019. Aortic Valve Disease,  Signs/Symptoms:Chest         Pain; Risk Factors:Hypertension, Diabetes and  Dyslipidemia. S/p         Covid 06/2019: fever SOB.         Aortic Valve: 23 mm Edwards bioprosthetic valve is present  in         the aortic position. Procedure Date: 04/29/19.    Sonographer:  Pilar Jarvis RDMS, RVT, RDCS  Referring Phys: [id number] BRIAN AGBOR-ETANG   IMPRESSIONS    1. Left ventricular ejection fraction, by estimation, is 60 to 65%. The  left ventricle has normal function. The left ventricle has no regional  wall motion abnormalities. Left ventricular diastolic parameters are  consistent with Grade I diastolic  dysfunction (impaired relaxation).  2. Right ventricular systolic function is normal. The right ventricular  size is normal. There is normal pulmonary artery systolic pressure.  3. Left atrial size was mildly dilated.  4. Mild mitral valve regurgitation.  5. The aortic valve is normal in structure. Aortic valve regurgitation is  not visualized. Very mild aortic valve stenosis, mean gradient 13 mm Hg.  There is a 23 mm Edwards bioprosthetic valve present in the aortic  position. Procedure Date: 04/29/19.   FINDINGS  Left  Ventricle: Left ventricular ejection fraction, by estimation, is 60  to 65%. The left ventricle has normal function. The left ventricle has no  regional wall motion abnormalities. Definity contrast agent was given IV  to delineate the left ventricular  endocardial borders. The left ventricular internal cavity size was normal  in size. There is no left ventricular hypertrophy. Left ventricular  diastolic parameters are consistent with Grade I diastolic dysfunction  (impaired relaxation).   Right Ventricle: The right ventricular size is normal. No increase in  right ventricular wall thickness. Right ventricular systolic function is  normal.  There is normal pulmonary artery systolic pressure. The tricuspid  regurgitant velocity is 2.23 m/s, and  with an assumed right atrial pressure of 10 mmHg, the estimated right  ventricular systolic pressure is 40.9 mmHg.   Left Atrium: Left atrial size was mildly dilated.   Right Atrium: Right atrial size was normal in size.   Pericardium: There is no evidence of pericardial effusion.   Mitral Valve: The mitral valve is normal in structure. Normal mobility of  the mitral valve leaflets. Mild mitral valve regurgitation. No evidence of  mitral valve stenosis.   Tricuspid Valve: The tricuspid valve is normal in structure. Tricuspid  valve regurgitation is trivial. No evidence of tricuspid stenosis.   Aortic Valve: The aortic valve is normal in structure. Aortic valve  regurgitation is not visualized. Mild aortic stenosis is present. Aortic  valve mean gradient measures 13.0 mmHg. Aortic valve peak gradient  measures 21.2 mmHg. Aortic valve area, by VTI  measures 2.67 cm. There is a 23 mm Edwards bioprosthetic valve present in  the aortic position. Procedure Date: 04/29/19.   Pulmonic Valve: The pulmonic valve was normal in structure. Pulmonic valve  regurgitation is not visualized. No evidence of pulmonic stenosis.   Aorta: The aortic root is  normal in size and structure.   Venous: The inferior vena cava is normal in size with greater than 50%  respiratory variability, suggesting right atrial pressure of 3 mmHg.   IAS/Shunts: No atrial level shunt detected by color flow Doppler.     LEFT VENTRICLE  PLAX 2D  LVIDd:     3.60 cm Diastology  LVIDs:     2.60 cm LV e' lateral:  11.10 cm/s  LV PW:     0.90 cm LV E/e' lateral: 10.0  LV IVS:    0.90 cm LV e' medial:  9.14 cm/s  LVOT diam:   2.00 cm LV E/e' medial: 12.1  LV SV:     105  LV SV Index:  48  LVOT Area:   3.14 cm     RIGHT VENTRICLE  RV Basal diam: 3.80 cm  RV S prime:   11.90 cm/s  TAPSE (M-mode): 1.8 cm   LEFT ATRIUM       Index    RIGHT ATRIUM      Index  LA diam:    5.10 cm 2.34 cm/m RA Area:   17.20 cm  LA Vol (A2C):  75.2 ml 34.50 ml/m RA Volume:  42.90 ml 19.68 ml/m  LA Vol (A4C):  58.8 ml 26.98 ml/m  LA Biplane Vol: 70.1 ml 32.16 ml/m  AORTIC VALVE          PULMONIC VALVE  AV Area (Vmax):  2.80 cm   PV Vmax:    1.20 m/s  AV Area (Vmean):  2.58 cm   PV Peak grad: 5.8 mmHg  AV Area (VTI):   2.67 cm  AV Vmax:      230.00 cm/s  AV Vmean:     153.000 cm/s  AV VTI:      0.394 m  AV Peak Grad:   21.2 mmHg  AV Mean Grad:   13.0 mmHg  LVOT Vmax:     205.00 cm/s  LVOT Vmean:    125.500 cm/s  LVOT VTI:     0.334 m  LVOT/AV VTI ratio: 0.85    AORTA  Ao Root diam: 3.00 cm  Ao Asc diam: 3.20 cm  Ao Arch diam: 2.9 cm   MITRAL VALVE  TRICUSPID VALVE  MV Area (PHT): 3.68 cm   TR Peak grad:  19.9 mmHg  MV Decel Time: 206 msec   TR Vmax:    223.00 cm/s  MV E velocity: 111.00 cm/s  MV A velocity: 123.00 cm/s SHUNTS  MV E/A ratio: 0.90     Systemic VTI: 0.33 m               Systemic Diam: 2.00 cm   Ida Rogue MD  Electronically signed by Ida Rogue MD  Signature  Date/Time: 08/21/2019/5:14:53 PM    Impression:  Patient is doing well approximately 1 year status post aortic valve replacement with a bioprosthetic tissue valve via right anterior minithoracotomy approach.    Plan:  Patient will continue to follow-up intermittently with Dr. Garen Lah and return to our office in the future only should specific problems or questions arise.  The patient has been reminded regarding the importance of dental hygiene and the lifelong need for antibiotic prophylaxis for all dental cleanings and other related invasive procedures.   I spent in excess of 15 minutes during the conduct of this office consultation and >50% of this time involved direct face-to-face encounter with the patient for counseling and/or coordination of their care.    Valentina Gu. Roxy Manns, MD 04/18/2020 9:26 AM

## 2020-04-18 NOTE — Patient Instructions (Signed)

## 2020-06-10 ENCOUNTER — Encounter: Payer: Self-pay | Admitting: Family Medicine

## 2020-06-10 ENCOUNTER — Other Ambulatory Visit: Payer: Self-pay

## 2020-06-10 ENCOUNTER — Ambulatory Visit (INDEPENDENT_AMBULATORY_CARE_PROVIDER_SITE_OTHER): Payer: 59 | Admitting: Family Medicine

## 2020-06-10 VITALS — BP 134/78 | HR 94 | Temp 98.0°F | Ht 70.0 in | Wt 246.2 lb

## 2020-06-10 DIAGNOSIS — R490 Dysphonia: Secondary | ICD-10-CM

## 2020-06-10 DIAGNOSIS — E119 Type 2 diabetes mellitus without complications: Secondary | ICD-10-CM

## 2020-06-10 DIAGNOSIS — I10 Essential (primary) hypertension: Secondary | ICD-10-CM

## 2020-06-10 DIAGNOSIS — Z953 Presence of xenogenic heart valve: Secondary | ICD-10-CM

## 2020-06-10 DIAGNOSIS — J4520 Mild intermittent asthma, uncomplicated: Secondary | ICD-10-CM

## 2020-06-10 LAB — POCT GLYCOSYLATED HEMOGLOBIN (HGB A1C): Hemoglobin A1C: 6.3 % — AB (ref 4.0–5.6)

## 2020-06-10 NOTE — Assessment & Plan Note (Signed)
Saw ENT, treated with abx and prednisone course for possible vocal cord nodules - pending f/u.

## 2020-06-10 NOTE — Assessment & Plan Note (Signed)
Chronic, stable on current regimen - continue. Foot exam today. UTD eye exam.

## 2020-06-10 NOTE — Assessment & Plan Note (Signed)
Chronic, stable. Continue current regimen. 

## 2020-06-10 NOTE — Progress Notes (Signed)
Patient ID: Erik Allen, male    DOB: 30-Nov-1955, 65 y.o.   MRN: [id number]  This visit was conducted in person.  BP 134/78   Pulse 94   Temp 98 F (36.7 C) (Temporal)   Ht 5\' 10"  (1.778 m)   Wt 246 lb 4 oz (111.7 kg)   SpO2 96%   BMI 35.33 kg/m    CC: 6 mo DM f/u visit  Subjective:   HPI: Erik Allen is a 65 y.o. male presenting on 06/10/2020 for Diabetes (Here for 6 mo f/u.)   Fully retired 02/11/2020. Enjoying retired life.   Notes cough worse at night, especially when supine. No leg swelling or dyspnea. He uses 2 pillows to sleep at night. Known h/o asthma on advair, singulair with PRN albuterol.   S/p minimally invasive AVR with bioprosthetic valve Erik Allen) regularly sees cardiology. Released from care by thoracic surgeon. Does need SBE ppx for all future dental procedures. He recently had root canal and crown placement - did use amoxicillin 2gm prior to procedure.  Recently saw ENT Erik Allen) for hoarseness - found vocal cord nodules and treated with prednisone and amoxicillin. Discussing possible surgery to clean vocal cords  DM - does regularly check fasting sugars 109-130. Compliant with antihyperglycemic regimen which includes: metformin 500mg  bid. Trying to get a few walks in per week. Denies low sugars or hypoglycemic symptoms. Denies paresthesias. Last diabetic eye exam 07/2019. Glucometer brand: accuchek. DSME: declined. Lab Results  Component Value Date   HGBA1C 6.3 (A) 06/10/2020   Diabetic Foot Exam - Simple   Simple Foot Form Diabetic Foot exam was performed with the following findings: Yes 06/10/2020  8:10 AM  Visual Inspection No deformities, no ulcerations, no other skin breakdown bilaterally: Yes Sensation Testing Intact to touch and monofilament testing bilaterally: Yes Pulse Check Posterior Tibialis and Dorsalis pulse intact bilaterally: Yes Comments    Lab Results  Component Value Date   MICROALBUR 2.6 (H) 07/09/2017        Relevant  past medical, surgical, family and social history reviewed and updated as indicated. Interim medical history since our last visit reviewed. Allergies and medications reviewed and updated. Outpatient Medications Prior to Visit  Medication Sig Dispense Refill  . Accu-Chek FastClix Lancets MISC USE AS DIRECTED TO CHECK BLOOD SUGAR DAILY 102 each 3  . ACCU-CHEK GUIDE test strip USE AS INSTRUCTED 100 strip 1  . albuterol (PROVENTIL) (2.5 MG/3ML) 0.083% nebulizer solution USE 1 VIAL VIA NEBULIZER EVERY 6 HOURS AS NEEDED (Patient taking differently: Take 2.5 mg by nebulization every 6 (six) hours as needed for wheezing or shortness of breath. USE 1 VIAL VIA NEBULIZER EVERY 6 HOURS AS NEEDE) 150 mL 3  . albuterol (VENTOLIN HFA) 108 (90 Base) MCG/ACT inhaler INHALE 2 PUFFS INTO THE LUNGS EVERY 6 HOURS AS NEEDED FOR WHEEZING OR SHORTNESS OF BREATH 18 g 4  . amoxicillin-clavulanate (AUGMENTIN) 875-125 MG tablet Take 1 tablet by mouth 2 (two) times daily.    Marland Kitchen ascorbic acid (VITAMIN C) 500 MG tablet Take 1 tablet (500 mg total) by mouth daily. 30 tablet 0  . aspirin 81 MG EC tablet Take 1 tablet (81 mg total) by mouth daily. 30 tablet 0  . cholecalciferol (VITAMIN D3) 10 MCG (400 UNIT) TABS tablet Take 400 Units by mouth daily.    . cyanocobalamin (V-R VITAMIN B-12) 500 MCG tablet Take 1 tablet (500 mcg total) by mouth daily.    . finasteride (PROSCAR) 5 MG tablet TAKE  1 TABLET BY MOUTH EVERY DAY 90 tablet 2  . Fluticasone-Salmeterol (ADVAIR DISKUS) 250-50 MCG/DOSE AEPB INHALE 1 PUFF BY MOUTH TWICE DAILY. RINSE MOUTH AFTER USE 180 each 1  . metFORMIN (GLUCOPHAGE) 500 MG tablet TAKE 1 TABLET (500 MG TOTAL) BY MOUTH 2 (TWO) TIMES DAILY WITH A MEAL. 180 tablet 2  . montelukast (SINGULAIR) 10 MG tablet TAKE 1 TABLET BY MOUTH EVERY NIGHT AT BEDTIME 90 tablet 3  . pantoprazole (PROTONIX) 40 MG tablet Take 40 mg by mouth at bedtime.    . predniSONE (DELTASONE) 10 MG tablet Take by mouth.    . simvastatin (ZOCOR) 80  MG tablet TAKE 1 TABLET BY MOUTH EVERYDAY AT BEDTIME 90 tablet 3  . valsartan-hydrochlorothiazide (DIOVAN-HCT) 160-25 MG tablet TAKE 1 TABLET BY MOUTH EVERY DAY 90 tablet 1  . zinc sulfate 220 (50 Zn) MG capsule Take 1 capsule (220 mg total) by mouth daily. 30 capsule 0   No facility-administered medications prior to visit.     Per HPI unless specifically indicated in ROS section below Review of Systems Objective:  BP 134/78   Pulse 94   Temp 98 F (36.7 C) (Temporal)   Ht 5\' 10"  (1.778 m)   Wt 246 lb 4 oz (111.7 kg)   SpO2 96%   BMI 35.33 kg/m   Wt Readings from Last 3 Encounters:  06/10/20 246 lb 4 oz (111.7 kg)  04/18/20 242 lb 6.4 oz (110 kg)  01/11/20 242 lb (109.8 kg)      Physical Exam Vitals and nursing note reviewed.  Constitutional:      General: He is not in acute distress.    Appearance: He is well-developed.  HENT:     Head: Normocephalic and atraumatic.     Right Ear: External ear normal.     Left Ear: External ear normal.     Nose: Nose normal.     Mouth/Throat:     Pharynx: No oropharyngeal exudate.  Eyes:     General: No scleral icterus.    Conjunctiva/sclera: Conjunctivae normal.     Pupils: Pupils are equal, round, and reactive to light.  Cardiovascular:     Rate and Rhythm: Normal rate and regular rhythm.     Pulses: Normal pulses.     Heart sounds: Normal heart sounds. No murmur heard.     Comments: S/p bioprosthetic AVR Pulmonary:     Effort: Pulmonary effort is normal. No respiratory distress.     Breath sounds: Normal breath sounds. No wheezing, rhonchi or rales.  Musculoskeletal:        General: Normal range of motion.     Cervical back: Normal range of motion and neck supple.     Right lower leg: No edema.     Left lower leg: No edema.     Comments: See HPI for foot exam if done  Lymphadenopathy:     Cervical: No cervical adenopathy.  Skin:    General: Skin is warm and dry.     Findings: No rash.       Results for orders placed  or performed in visit on 06/10/20  POCT glycosylated hemoglobin (Hb A1C)  Result Value Ref Range   Hemoglobin A1C 6.3 (A) 4.0 - 5.6 %   HbA1c POC (<> result, manual entry)     HbA1c, POC (prediabetic range)     HbA1c, POC (controlled diabetic range)     Assessment & Plan:  This visit occurred during the SARS-CoV-2 public health emergency.  Safety  protocols were in place, including screening questions prior to the visit, additional usage of staff PPE, and extensive cleaning of exam room while observing appropriate contact time as indicated for disinfecting solutions.   Problem List Items Addressed This Visit    Essential hypertension    Chronic, stable. Continue current regimen.       Asthma    Stable period on advair, singulair, PRN albuterol Sees pulm later this summer.      Relevant Medications   predniSONE (DELTASONE) 10 MG tablet   Controlled type 2 diabetes mellitus without complication, without long-term current use of insulin (HCC) - Primary    Chronic, stable on current regimen - continue. Foot exam today. UTD eye exam.       Relevant Orders   POCT glycosylated hemoglobin (Hb A1C) (Completed)   Severe obesity (BMI 35.0-39.9) with comorbidity ([place])    Encouraged ongoing healthy diet and activity for sustainable weight loss.       Hoarseness    Saw ENT, treated with abx and prednisone course for possible vocal cord nodules - pending f/u.       S/P minimally invasive aortic valve replacement with bioprosthetic valve    Released from thoracic surgery care planned continued cards yearly f/u.  Needs SBE ppx prior to dental procedures          No orders of the defined types were placed in this encounter.  Orders Placed This Encounter  Procedures  . POCT glycosylated hemoglobin (Hb A1C)    Patient Instructions  You are doing well. Continue current medicines. Return after 08/20/2020 for physical.   Follow up plan: Return in about 11 weeks (around 08/26/2020) for  annual exam, prior fasting for blood work.  Ria Bush, MD

## 2020-06-10 NOTE — Patient Instructions (Addendum)
You are doing well. Continue current medicines. Return after 08/20/2020 for physical.

## 2020-06-10 NOTE — Assessment & Plan Note (Signed)
Encouraged ongoing healthy diet and activity for sustainable weight loss.

## 2020-06-10 NOTE — Assessment & Plan Note (Signed)
Released from thoracic surgery care planned continued cards yearly f/u.  Needs SBE ppx prior to dental procedures

## 2020-06-10 NOTE — Assessment & Plan Note (Signed)
Stable period on advair, singulair, PRN albuterol Sees pulm later this summer.

## 2020-06-12 HISTORY — PX: MICROLARYNGOSCOPY: SHX5208

## 2020-07-06 ENCOUNTER — Encounter: Payer: Self-pay | Admitting: Otolaryngology

## 2020-07-08 NOTE — Discharge Instructions (Signed)

## 2020-07-12 ENCOUNTER — Encounter
Admission: RE | Disposition: A | Discharge status: Home / Self Care | Payer: Self-pay | Source: Home / Self Care | Attending: Otolaryngology

## 2020-07-12 ENCOUNTER — Ambulatory Visit: Payer: 59 | Admitting: Anesthesiology

## 2020-07-12 ENCOUNTER — Encounter: Payer: Self-pay | Admitting: Otolaryngology

## 2020-07-12 ENCOUNTER — Ambulatory Visit
Admission: RE | Admit: 2020-07-12 | Discharge: 2020-07-12 | Disposition: A | Discharge status: Home / Self Care | Payer: 59 | Attending: Otolaryngology | Admitting: Otolaryngology

## 2020-07-12 ENCOUNTER — Other Ambulatory Visit: Payer: Self-pay

## 2020-07-12 DIAGNOSIS — K219 Gastro-esophageal reflux disease without esophagitis: Secondary | ICD-10-CM | POA: Diagnosis not present

## 2020-07-12 DIAGNOSIS — Z8679 Personal history of other diseases of the circulatory system: Secondary | ICD-10-CM | POA: Diagnosis not present

## 2020-07-12 DIAGNOSIS — Z888 Allergy status to other drugs, medicaments and biological substances status: Secondary | ICD-10-CM | POA: Insufficient documentation

## 2020-07-12 DIAGNOSIS — E785 Hyperlipidemia, unspecified: Secondary | ICD-10-CM | POA: Insufficient documentation

## 2020-07-12 DIAGNOSIS — Z885 Allergy status to narcotic agent status: Secondary | ICD-10-CM | POA: Diagnosis not present

## 2020-07-12 DIAGNOSIS — Z8639 Personal history of other endocrine, nutritional and metabolic disease: Secondary | ICD-10-CM | POA: Diagnosis not present

## 2020-07-12 DIAGNOSIS — Z79899 Other long term (current) drug therapy: Secondary | ICD-10-CM | POA: Insufficient documentation

## 2020-07-12 DIAGNOSIS — Z7951 Long term (current) use of inhaled steroids: Secondary | ICD-10-CM | POA: Diagnosis not present

## 2020-07-12 DIAGNOSIS — J381 Polyp of vocal cord and larynx: Secondary | ICD-10-CM | POA: Insufficient documentation

## 2020-07-12 HISTORY — DX: Dizziness and giddiness: R42

## 2020-07-12 HISTORY — PX: DIRECT LARYNGOSCOPY: SHX5326

## 2020-07-12 LAB — GLUCOSE, CAPILLARY
Glucose-Capillary: 133 mg/dL — ABNORMAL HIGH (ref 70–99)
Glucose-Capillary: 149 mg/dL — ABNORMAL HIGH (ref 70–99)

## 2020-07-12 SURGERY — LARYNGOSCOPY, DIRECT
Anesthesia: General | Site: Throat

## 2020-07-12 MED ORDER — FENTANYL CITRATE (PF) 100 MCG/2ML IJ SOLN: 25.0000 ug | INTRAMUSCULAR | Status: DC | PRN

## 2020-07-12 MED ORDER — LIDOCAINE HCL (CARDIAC) PF 100 MG/5ML IV SOSY
PREFILLED_SYRINGE | INTRAVENOUS | Status: DC | PRN
  Administered 2020-07-12: 50 mg via INTRAVENOUS

## 2020-07-12 MED ORDER — OXYMETAZOLINE HCL 0.05 % NA SOLN
NASAL | Status: DC | PRN
  Administered 2020-07-12: 1 via TOPICAL

## 2020-07-12 MED ORDER — PROPOFOL 10 MG/ML IV BOLUS
INTRAVENOUS | Status: DC | PRN
  Administered 2020-07-12: 150 mg via INTRAVENOUS
  Administered 2020-07-12 (×2): 50 mg via INTRAVENOUS

## 2020-07-12 MED ORDER — AMOXICILLIN 875 MG PO TABS: 875.0000 mg | ORAL_TABLET | Freq: Two times a day (BID) | ORAL | 0 refills | Status: DC

## 2020-07-12 MED ORDER — HYDROCODONE-ACETAMINOPHEN 5-325 MG PO TABS: 1.0000 | ORAL_TABLET | Freq: Four times a day (QID) | ORAL | 0 refills | Status: DC | PRN

## 2020-07-12 MED ORDER — OXYCODONE HCL 5 MG PO TABS
5.0000 mg | ORAL_TABLET | Freq: Once | ORAL | Status: AC | PRN
Start: 2020-07-12 — End: 2020-07-12

## 2020-07-12 MED ORDER — MIDAZOLAM HCL 5 MG/5ML IJ SOLN
INTRAMUSCULAR | Status: DC | PRN
  Administered 2020-07-12: 2 mg via INTRAVENOUS

## 2020-07-12 MED ORDER — FENTANYL CITRATE (PF) 100 MCG/2ML IJ SOLN
INTRAMUSCULAR | Status: DC | PRN
  Administered 2020-07-12: 50 ug via INTRAVENOUS
  Administered 2020-07-12: 25 ug via INTRAVENOUS
  Administered 2020-07-12: 50 ug via INTRAVENOUS

## 2020-07-12 MED ORDER — SUCCINYLCHOLINE CHLORIDE 20 MG/ML IJ SOLN
INTRAMUSCULAR | Status: DC | PRN
  Administered 2020-07-12: 100 mg via INTRAVENOUS

## 2020-07-12 MED ORDER — DEXAMETHASONE SODIUM PHOSPHATE 4 MG/ML IJ SOLN
INTRAMUSCULAR | Status: DC | PRN
  Administered 2020-07-12: 10 mg via INTRAVENOUS

## 2020-07-12 MED ORDER — ONDANSETRON HCL 4 MG/2ML IJ SOLN
INTRAMUSCULAR | Status: DC | PRN
  Administered 2020-07-12: 4 mg via INTRAVENOUS

## 2020-07-12 MED ORDER — ONDANSETRON HCL 4 MG/2ML IJ SOLN: 4.0000 mg | Freq: Once | INTRAMUSCULAR | Status: DC | PRN

## 2020-07-12 MED ORDER — OXYCODONE HCL 5 MG/5ML PO SOLN
5.0000 mg | Freq: Once | ORAL | Status: AC | PRN
Start: 2020-07-12 — End: 2020-07-12
  Administered 2020-07-12: 5 mg via ORAL

## 2020-07-12 MED ORDER — ALBUTEROL SULFATE (2.5 MG/3ML) 0.083% IN NEBU
2.5000 mg | INHALATION_SOLUTION | Freq: Once | RESPIRATORY_TRACT | Status: AC
  Administered 2020-07-12: 2.5 mg via RESPIRATORY_TRACT

## 2020-07-12 MED ORDER — LACTATED RINGERS IV SOLN: INTRAVENOUS | Status: DC

## 2020-07-12 SURGICAL SUPPLY — 15 items
BLOCK BITE GUARD (MISCELLANEOUS) ×2 IMPLANT
COVER MAYO STAND STRL (DRAPES) ×2 IMPLANT
COVER TABLE BACK 60X90 (DRAPES) ×2 IMPLANT
CUP MEDICINE 2OZ PLAST GRAD ST (MISCELLANEOUS) ×2 IMPLANT
DRSG TELFA 4X3 1S NADH ST (GAUZE/BANDAGES/DRESSINGS) ×2 IMPLANT
GLOVE SURG ENC MOIS LTX SZ7.5 (GLOVE) ×2 IMPLANT
KIT TURNOVER KIT A (KITS) ×2 IMPLANT
MARKER SKIN DUAL TIP RULER LAB (MISCELLANEOUS) IMPLANT
NDL 18GX1X1/2 (RX/OR ONLY) (NEEDLE) IMPLANT
NEEDLE 18GX1X1/2 (RX/OR ONLY) (NEEDLE) ×2 IMPLANT
PATTIES SURGICAL .5 X.5 (GAUZE/BANDAGES/DRESSINGS) ×2 IMPLANT
SPONGE XRAY 4X4 16PLY STRL (MISCELLANEOUS) ×2 IMPLANT
TOWEL OR 17X26 4PK STRL BLUE (TOWEL DISPOSABLE) ×2 IMPLANT
TUBING CONN 6MMX3.1M (TUBING) ×2
TUBING SUCTION CONN 0.25 STRL (TUBING) ×1 IMPLANT

## 2020-07-12 NOTE — Anesthesia Preprocedure Evaluation (Signed)
Anesthesia Evaluation  Patient identified by MRN, date of birth, ID band Patient awake    Reviewed: Allergy & Precautions, H&P , NPO status , Patient's Chart, lab work & pertinent test results  Airway Mallampati: I  TM Distance: >3 FB Neck ROM: full    Dental no notable dental hx.    Pulmonary asthma ,    Pulmonary exam normal        Cardiovascular hypertension,  Rhythm:regular Rate:Normal + Systolic murmurs Aortic valve replacement last year- denies CP/SOB.  Follow up echos have been normal.   Neuro/Psych    GI/Hepatic Neg liver ROS, Medicated,  Endo/Other  diabetes, Well Controlled, Type 2  Renal/GU   negative genitourinary   Musculoskeletal   Abdominal   Peds  Hematology negative hematology ROS (+)   Anesthesia Other Findings   Reproductive/Obstetrics                             Anesthesia Physical Anesthesia Plan  ASA: II  Anesthesia Plan: General ETT   Post-op Pain Management:    Induction:   PONV Risk Score and Plan: 2 and Ondansetron, Dexamethasone, Treatment may vary due to age or medical condition and Midazolam  Airway Management Planned:   Additional Equipment:   Intra-op Plan:   Post-operative Plan:   Informed Consent: I have reviewed the patients History and Physical, chart, labs and discussed the procedure including the risks, benefits and alternatives for the proposed anesthesia with the patient or authorized representative who has indicated his/her understanding and acceptance.       Plan Discussed with:   Anesthesia Plan Comments:         Anesthesia Quick Evaluation

## 2020-07-12 NOTE — Anesthesia Procedure Notes (Signed)
Procedure Name: Intubation Date/Time: 07/12/2020 9:47 AM Performed by: Mayme Genta, CRNA Pre-anesthesia Checklist: Patient identified, Emergency Drugs available, Suction available, Patient being monitored and Timeout performed Patient Re-evaluated:Patient Re-evaluated prior to induction Oxygen Delivery Method: Circle system utilized Preoxygenation: Pre-oxygenation with 100% oxygen Induction Type: IV induction Ventilation: Mask ventilation without difficulty Laryngoscope Size: Miller and 3 Grade View: Grade I Tube type: MLT Tube size: 6.0 mm Number of attempts: 1 Placement Confirmation: ETT inserted through vocal cords under direct vision,  positive ETCO2 and breath sounds checked- equal and bilateral Tube secured with: Tape Dental Injury: Teeth and Oropharynx as per pre-operative assessment

## 2020-07-12 NOTE — Transfer of Care (Signed)
Immediate Anesthesia Transfer of Care Note  Patient: Erik Allen  Procedure(s) Performed: DIRECT LARYNGOSCOPY WITH EXCISION OF VOCAL CORD NODULE (N/A Throat)  Patient Location: PACU  Anesthesia Type: General ETT  Level of Consciousness: awake, alert  and patient cooperative  Airway and Oxygen Therapy: Patient Spontanous Breathing and Patient connected to supplemental oxygen  Post-op Assessment: Post-op Vital signs reviewed, Patient's Cardiovascular Status Stable, Respiratory Function Stable, Patent Airway and No signs of Nausea or vomiting  Post-op Vital Signs: Reviewed and stable  Complications: No complications documented.

## 2020-07-12 NOTE — Anesthesia Postprocedure Evaluation (Signed)
Anesthesia Post Note  Patient: Erik Allen  Procedure(s) Performed: DIRECT LARYNGOSCOPY WITH EXCISION OF VOCAL CORD NODULE (N/A Throat)     Patient location during evaluation: PACU Anesthesia Type: General Level of consciousness: awake and alert Pain management: pain level controlled Vital Signs Assessment: post-procedure vital signs reviewed and stable Respiratory status: spontaneous breathing Cardiovascular status: stable Anesthetic complications: no   No complications documented.  Gillian Scarce

## 2020-07-12 NOTE — H&P (Signed)
History and physical reviewed and will be scanned in later. No change in medical status reported by the patient or family, appears stable for surgery. All questions regarding the procedure answered, and patient (or family if a child) expressed understanding of the procedure. ? ?Erik Allen ?@TODAY@ ?

## 2020-07-12 NOTE — Op Note (Signed)
07/12/2020  10:19 AM    Erik Allen  [id number]    Pre-Op Diagnosis:  VOCAL CORD NODULES  Post-op Diagnosis: VOCAL CORD NODULES  Procedure:  Microlaryngoscopy with excision of bilateral true vocal cord nodules  Surgeon:  Riley Nearing., MD  Anesthesia:  General Endotracheal  EBL:  minimal  Complications:  None  Findings:  Bilateral mid-cord ulcerated superficial nodules  Procedure: With the patient in a comfortable supine position, general endotracheal anesthesia was induced without difficulty.  At an appropriate level, the table was turned 90 degrees away from Anesthesia.  A clean preparation and draping was performed in the standard fashion.   A tooth guard was placed. Using the Dedo laryngoscope, the oropharynx, hypopharynx and larynx were carefully inspected. The scope was passed into the endolarynx, visualizing the nodules, and the patient placed into suspension with the Lewy arm. Photodocumentation of the nodules was obtained using the 0 degree telescope. The operating microscope was then used for better visualization.  The nodule on the right cord was grasped with angled cup forceps and excised using the microlaryngeal scissors, taking care to avoid injury to the underlying vocalis muscle. This was sent as a specimen. The left cord nodule was excised in the same fashion.   Bleeding was controlled with Afrin moistened pledgets. Post-excision photo-documentation was obtained.   The laryngoscope was removed.  At this point the procedure was completed.  Dental status was intact.  The patient was returned to Anesthesia, awakened, extubated, and transferred to PACU in satisfactory condition.   Disposition: To PACU, then discharge home  Plan: Soft, bland diet, advance as tolerated. Take pain medications, antibiotics as prescribed. Voice rest. Follow-up in 3 weeks.  Riley Nearing 07/12/2020 10:19 AM

## 2020-07-13 LAB — SURGICAL PATHOLOGY

## 2020-07-25 ENCOUNTER — Other Ambulatory Visit: Payer: Self-pay | Admitting: Family Medicine

## 2020-08-20 ENCOUNTER — Other Ambulatory Visit: Payer: Self-pay | Admitting: Family Medicine

## 2020-08-20 DIAGNOSIS — E559 Vitamin D deficiency, unspecified: Secondary | ICD-10-CM

## 2020-08-20 DIAGNOSIS — E785 Hyperlipidemia, unspecified: Secondary | ICD-10-CM

## 2020-08-20 DIAGNOSIS — E1169 Type 2 diabetes mellitus with other specified complication: Secondary | ICD-10-CM

## 2020-08-20 DIAGNOSIS — N400 Enlarged prostate without lower urinary tract symptoms: Secondary | ICD-10-CM

## 2020-08-20 DIAGNOSIS — E119 Type 2 diabetes mellitus without complications: Secondary | ICD-10-CM

## 2020-08-23 ENCOUNTER — Other Ambulatory Visit: Payer: Self-pay | Admitting: Family Medicine

## 2020-08-24 ENCOUNTER — Other Ambulatory Visit: Payer: Self-pay

## 2020-08-24 ENCOUNTER — Other Ambulatory Visit (INDEPENDENT_AMBULATORY_CARE_PROVIDER_SITE_OTHER): Payer: 59

## 2020-08-24 DIAGNOSIS — E1169 Type 2 diabetes mellitus with other specified complication: Secondary | ICD-10-CM | POA: Diagnosis not present

## 2020-08-24 DIAGNOSIS — N400 Enlarged prostate without lower urinary tract symptoms: Secondary | ICD-10-CM

## 2020-08-24 DIAGNOSIS — E119 Type 2 diabetes mellitus without complications: Secondary | ICD-10-CM

## 2020-08-24 DIAGNOSIS — E559 Vitamin D deficiency, unspecified: Secondary | ICD-10-CM

## 2020-08-24 DIAGNOSIS — E785 Hyperlipidemia, unspecified: Secondary | ICD-10-CM | POA: Diagnosis not present

## 2020-08-24 LAB — COMPREHENSIVE METABOLIC PANEL
ALT: 74 U/L — ABNORMAL HIGH (ref 0–53)
AST: 59 U/L — ABNORMAL HIGH (ref 0–37)
Albumin: 4.4 g/dL (ref 3.5–5.2)
Alkaline Phosphatase: 53 U/L (ref 39–117)
BUN: 18 mg/dL (ref 6–23)
CO2: 31 mEq/L (ref 19–32)
Calcium: 9.3 mg/dL (ref 8.4–10.5)
Chloride: 100 mEq/L (ref 96–112)
Creatinine, Ser: 0.85 mg/dL (ref 0.40–1.50)
GFR: 92.82 mL/min (ref >60.00)
Glucose, Bld: 116 mg/dL — ABNORMAL HIGH (ref 70–99)
Potassium: 4 mEq/L (ref 3.5–5.1)
Sodium: 140 mEq/L (ref 135–145)
Total Bilirubin: 0.4 mg/dL (ref 0.2–1.2)
Total Protein: 6.8 g/dL (ref 6.0–8.3)

## 2020-08-24 LAB — PSA: PSA: 0.1 ng/mL (ref 0.10–4.00)

## 2020-08-24 LAB — VITAMIN D 25 HYDROXY (VIT D DEFICIENCY, FRACTURES): VITD: 51.42 ng/mL (ref 30.00–100.00)

## 2020-08-24 LAB — LIPID PANEL
Cholesterol: 190 mg/dL (ref 0–200)
HDL: 54.8 mg/dL (ref >39.00)
NonHDL: 135.09
Total CHOL/HDL Ratio: 3
Triglycerides: 217 mg/dL — ABNORMAL HIGH (ref 0.0–149.0)
VLDL: 43.4 mg/dL — ABNORMAL HIGH (ref 0.0–40.0)

## 2020-08-24 LAB — LDL CHOLESTEROL, DIRECT: Direct LDL: 111 mg/dL

## 2020-08-24 LAB — HEMOGLOBIN A1C: Hgb A1c MFr Bld: 7.2 % — ABNORMAL HIGH (ref 4.6–6.5)

## 2020-08-31 ENCOUNTER — Encounter: Payer: Self-pay | Admitting: Family Medicine

## 2020-08-31 ENCOUNTER — Ambulatory Visit (INDEPENDENT_AMBULATORY_CARE_PROVIDER_SITE_OTHER): Payer: 59 | Admitting: Family Medicine

## 2020-08-31 ENCOUNTER — Other Ambulatory Visit: Payer: Self-pay

## 2020-08-31 VITALS — BP 138/82 | HR 91 | Temp 98.3°F | Ht 70.0 in | Wt 246.0 lb

## 2020-08-31 DIAGNOSIS — I10 Essential (primary) hypertension: Secondary | ICD-10-CM

## 2020-08-31 DIAGNOSIS — Z23 Encounter for immunization: Secondary | ICD-10-CM

## 2020-08-31 DIAGNOSIS — E785 Hyperlipidemia, unspecified: Secondary | ICD-10-CM

## 2020-08-31 DIAGNOSIS — R490 Dysphonia: Secondary | ICD-10-CM

## 2020-08-31 DIAGNOSIS — Z0000 Encounter for general adult medical examination without abnormal findings: Secondary | ICD-10-CM | POA: Diagnosis not present

## 2020-08-31 DIAGNOSIS — E8881 Metabolic syndrome: Secondary | ICD-10-CM

## 2020-08-31 DIAGNOSIS — K219 Gastro-esophageal reflux disease without esophagitis: Secondary | ICD-10-CM

## 2020-08-31 DIAGNOSIS — N400 Enlarged prostate without lower urinary tract symptoms: Secondary | ICD-10-CM | POA: Diagnosis not present

## 2020-08-31 DIAGNOSIS — K760 Fatty (change of) liver, not elsewhere classified: Secondary | ICD-10-CM

## 2020-08-31 DIAGNOSIS — E119 Type 2 diabetes mellitus without complications: Secondary | ICD-10-CM

## 2020-08-31 DIAGNOSIS — E559 Vitamin D deficiency, unspecified: Secondary | ICD-10-CM

## 2020-08-31 DIAGNOSIS — Z953 Presence of xenogenic heart valve: Secondary | ICD-10-CM

## 2020-08-31 DIAGNOSIS — E1169 Type 2 diabetes mellitus with other specified complication: Secondary | ICD-10-CM

## 2020-08-31 DIAGNOSIS — J4520 Mild intermittent asthma, uncomplicated: Secondary | ICD-10-CM

## 2020-08-31 MED ORDER — MONTELUKAST SODIUM 10 MG PO TABS: 10.0000 mg | ORAL_TABLET | Freq: Every day | ORAL | 3 refills | Status: DC

## 2020-08-31 MED ORDER — FLUTICASONE-SALMETEROL 250-50 MCG/ACT IN AEPB: 1.0000 | INHALATION_SPRAY | Freq: Two times a day (BID) | RESPIRATORY_TRACT | 3 refills | Status: DC

## 2020-08-31 MED ORDER — ROSUVASTATIN CALCIUM 40 MG PO TABS: 40.0000 mg | ORAL_TABLET | Freq: Every day | ORAL | 3 refills | Status: DC

## 2020-08-31 MED ORDER — METFORMIN HCL 500 MG PO TABS: 500.0000 mg | ORAL_TABLET | Freq: Two times a day (BID) | ORAL | 3 refills | Status: DC

## 2020-08-31 MED ORDER — VALSARTAN-HYDROCHLOROTHIAZIDE 160-25 MG PO TABS: 1.0000 | ORAL_TABLET | Freq: Every day | ORAL | 3 refills | Status: DC

## 2020-08-31 MED ORDER — FINASTERIDE 5 MG PO TABS: 5.0000 mg | ORAL_TABLET | Freq: Every day | ORAL | 3 refills | Status: DC

## 2020-08-31 MED ORDER — ALBUTEROL SULFATE HFA 108 (90 BASE) MCG/ACT IN AERS: 2.0000 | INHALATION_SPRAY | Freq: Four times a day (QID) | RESPIRATORY_TRACT | 6 refills | Status: DC | PRN

## 2020-08-31 NOTE — Assessment & Plan Note (Signed)
New transaminitis noted, presumed fatty liver related. Recommend weight loss and better glycemic control.

## 2020-08-31 NOTE — Assessment & Plan Note (Signed)
S/p vocal cord polyp excision 06/2020 with improvement Richardson Landry)

## 2020-08-31 NOTE — Assessment & Plan Note (Signed)
A1c trending up - discussed with patient. He will renew efforts at low sugar low carb diabetic diet and weight loss. Return 3 months DM f/u visit. Discussed possibly starting weekly GLP1 RA at that time.

## 2020-08-31 NOTE — Assessment & Plan Note (Signed)
Continue vit D 400 IU daily.

## 2020-08-31 NOTE — Patient Instructions (Addendum)
Pass by lab to pick up stool kit.  [id number] today  Consider COVID vaccine - I do recommend this.  Consider shingles vaccine  Return in 3 months for diabetes follow up visit   Health Maintenance, Male Adopting a healthy lifestyle and getting preventive care are important in promoting health and wellness. Ask your health care provider about: The right schedule for you to have regular tests and exams. Things you can do on your own to prevent diseases and keep yourself healthy. What should I know about diet, weight, and exercise? Eat a healthy diet  Eat a diet that includes plenty of vegetables, fruits, low-fat dairy products, and lean protein. Do not eat a lot of foods that are high in solid fats, added sugars, or sodium.  Maintain a healthy weight Body mass index (BMI) is a measurement that can be used to identify possible weight problems. It estimates body fat based on height and weight. Your health care provider can help determine your BMI and help you achieve or maintain ahealthy weight. Get regular exercise Get regular exercise. This is one of the most important things you can do for your health. Most adults should: Exercise for at least 150 minutes each week. The exercise should increase your heart rate and make you sweat (moderate-intensity exercise). Do strengthening exercises at least twice a week. This is in addition to the moderate-intensity exercise. Spend less time sitting. Even light physical activity can be beneficial. Watch cholesterol and blood lipids Have your blood tested for lipids and cholesterol at 63 years of age, then havethis test every 5 years. You may need to have your cholesterol levels checked more often if: Your lipid or cholesterol levels are high. You are older than 63 years of age. You are at high risk for heart disease. What should I know about cancer screening? Many types of cancers can be detected early and may often be prevented. Depending on your  health history and family history, you may need to have cancer screening at various ages. This may include screening for: Colorectal cancer. Prostate cancer. Skin cancer. Lung cancer. What should I know about heart disease, diabetes, and high blood pressure? Blood pressure and heart disease High blood pressure causes heart disease and increases the risk of stroke. This is more likely to develop in people who have high blood pressure readings, are of African descent, or are overweight. Talk with your health care provider about your target blood pressure readings. Have your blood pressure checked: Every 3-5 years if you are 47-44 years of age. Every year if you are 39 years old or older. If you are between the ages of 75 and 18 and are a current or former smoker, ask your health care provider if you should have a one-time screening for abdominal aortic aneurysm (AAA). Diabetes Have regular diabetes screenings. This checks your fasting blood sugar level. Have the screening done: Once every three years after age 46 if you are at a normal weight and have a low risk for diabetes. More often and at a younger age if you are overweight or have a high risk for diabetes. What should I know about preventing infection? Hepatitis B If you have a higher risk for hepatitis B, you should be screened for this virus. Talk with your health care provider to find out if you are at risk forhepatitis B infection. Hepatitis C Blood testing is recommended for: Everyone born from [birth year] through 1963. Anyone with known risk factors for hepatitis C. Sexually  transmitted infections (STIs) You should be screened each year for STIs, including gonorrhea and chlamydia, if: You are sexually active and are younger than 63 years of age. You are older than 63 years of age and your health care provider tells you that you are at risk for this type of infection. Your sexual activity has changed since you were last screened, and  you are at increased risk for chlamydia or gonorrhea. Ask your health care provider if you are at risk. Ask your health care provider about whether you are at high risk for HIV. Your health care provider may recommend a prescription medicine to help prevent HIV infection. If you choose to take medicine to prevent HIV, you should first get tested for HIV. You should then be tested every 3 months for as long as you are taking the medicine. Follow these instructions at home: Lifestyle Do not use any products that contain nicotine or tobacco, such as cigarettes, e-cigarettes, and chewing tobacco. If you need help quitting, ask your health care provider. Do not use street drugs. Do not share needles. Ask your health care provider for help if you need support or information about quitting drugs. Alcohol use Do not drink alcohol if your health care provider tells you not to drink. If you drink alcohol: Limit how much you have to 0-2 drinks a day. Be aware of how much alcohol is in your drink. In the [place] one drink equals one 12 oz bottle of beer (355 mL), one 5 oz glass of wine (148 mL), or one 1 oz glass of hard liquor (44 mL). General instructions Schedule regular health, dental, and eye exams. Stay current with your vaccines. Tell your health care provider if: You often feel depressed. You have ever been abused or do not feel safe at home. Summary Adopting a healthy lifestyle and getting preventive care are important in promoting health and wellness. Follow your health care provider's instructions about healthy diet, exercising, and getting tested or screened for diseases. Follow your health care provider's instructions on monitoring your cholesterol and blood pressure. This information is not intended to replace advice given to you by your health care provider. Make sure you discuss any questions you have with your healthcare provider. Document Revised: 01/22/2018 Document Reviewed:  01/22/2018 Elsevier Patient Education  2022 [employer].

## 2020-08-31 NOTE — Assessment & Plan Note (Signed)
Chronic, stable on current regimen - continue valsartan/hctz.

## 2020-08-31 NOTE — Assessment & Plan Note (Signed)
Stable period on daily PPI - continue

## 2020-08-31 NOTE — Assessment & Plan Note (Signed)
Chronic, deterioration noted with LDL above goal in diabetic. Will change statin to crestor 40mg  daily.  The 10-year ASCVD risk score Erik Allen DC Erik Allen., et al., 2013) is: 23.8%   Values used to calculate the score:     Age: 65 years     Sex: Male     Is Non-Hispanic African American: No     Diabetic: Yes     Tobacco smoker: No     Systolic Blood Pressure: 448 mmHg     Is BP treated: Yes     HDL Cholesterol: 54.8 mg/dL     Total Cholesterol: 190 mg/dL

## 2020-08-31 NOTE — Assessment & Plan Note (Signed)
Stable period on finasteride

## 2020-08-31 NOTE — Assessment & Plan Note (Signed)
Preventative protocols reviewed and updated unless pt declined. Discussed healthy diet and lifestyle.  

## 2020-08-31 NOTE — Assessment & Plan Note (Signed)
Encouraged healthy diet and lifestyle choices to affect sustainable weight loss.  ?

## 2020-08-31 NOTE — Progress Notes (Signed)
Patient ID: Erik Allen, male    DOB: 24-Sep-1955, 65 y.o.   MRN: [id number]  This visit was conducted in person.  BP 138/82   Pulse 91   Temp 98.3 F (36.8 C) (Temporal)   Ht 5\' 10"  (1.778 m)   Wt 246 lb (111.6 kg)   SpO2 95%   BMI 35.30 kg/m    CC: CPE Subjective:   HPI: Erik Allen is a 65 y.o. male presenting on 08/31/2020 for Annual Exam   Fully retired 02/11/2020.   Minimally invasive AVR with bioprosthetic valve 04/2019 05/2019) for severe rheumatic aortic stenosis. Heart catheterization prior to surgery showed normal coronary arteries. Does need SBE ppx for future dental procedures.   Known asthmatic on singulair, advair, PRN albuterol. Known controlled diabetic on metformin. Saw [facility] pulmonology Dr [facility] post-COVID infection, treated with colchicine x2 mo.   Saw ENT Karna Christmas) for chronic hoarseness due to vocal cord nodules s/p direct laryngoscopy for excision with benefit.    Preventative: Colonoscopy - 2010 WNL, rpt due 10 yrs. Would like to defer colonoscopy. Did not complete iFOB last year.  Prostate - yearly screen, normal. Good stream, nocturia x1. Known BPH on proscar.  Lung cancer screen - not eligible  Flu shot yearly  COVID vaccine - discussed, declines  Pneumovax 2014, prevnar-20 today Tdap 2016 shingrix - discussed - declines Seat belt use discussed  Sunscreen use discussed, no changing moles on skin. sees derm regularly. Uses UV protection shirts.  Non smoker Alcohol - rare  Dentist - Q6 mo Eye exam - yearly [facility] doctor)   Caffeine: 2 diet mountain dews/day, occasional coffee Lives alone, no pets Occupation: works at [employer] Activity: no regular exercise, works yard regularly  Diet: good water, vegetables daily, some fish/meat      Relevant past medical, surgical, family and social history reviewed and updated as indicated. Interim medical history since our last visit reviewed. Allergies and  medications reviewed and updated. Outpatient Medications Prior to Visit  Medication Sig Dispense Refill  . Accu-Chek FastClix Lancets MISC USE AS DIRECTED TO CHECK BLOOD SUGAR DAILY 102 each 3  . ACCU-CHEK GUIDE test strip USE AS INSTRUCTED 100 strip 1  . albuterol (PROVENTIL) (2.5 MG/3ML) 0.083% nebulizer solution USE 1 VIAL VIA NEBULIZER EVERY 6 HOURS AS NEEDED (Patient taking differently: Take 2.5 mg by nebulization every 6 (six) hours as needed for wheezing or shortness of breath. USE 1 VIAL VIA NEBULIZER EVERY 6 HOURS AS NEEDE) 150 mL 3  . amoxicillin (AMOXIL) 875 MG tablet Take 1 tablet (875 mg total) by mouth 2 (two) times daily. 20 tablet 0  . ascorbic acid (VITAMIN C) 500 MG tablet Take 1 tablet (500 mg total) by mouth daily. 30 tablet 0  . aspirin 81 MG EC tablet Take 1 tablet (81 mg total) by mouth daily. 30 tablet 0  . cholecalciferol (VITAMIN D3) 10 MCG (400 UNIT) TABS tablet Take 400 Units by mouth daily.    . cyanocobalamin (V-R VITAMIN B-12) 500 MCG tablet Take 1 tablet (500 mcg total) by mouth daily.    . pantoprazole (PROTONIX) 40 MG tablet Take 40 mg by mouth at bedtime.    Administ[employer] zinc sulfate 220 (50 Zn) MG capsule Take 1 capsule (220 mg total) by mouth daily. 30 capsule 0  . albuterol (VENTOLIN HFA) 108 (90 Base) MCG/ACT inhaler INHALE 2 PUFFS INTO THE LUNGS EVERY 6 HOURS AS NEEDED FOR WHEEZING OR SHORTNESS OF BREATH 18 g  4  . finasteride (PROSCAR) 5 MG tablet TAKE 1 TABLET BY MOUTH EVERY DAY 90 tablet 2  . Fluticasone-Salmeterol (ADVAIR DISKUS) 250-50 MCG/DOSE AEPB INHALE 1 PUFF BY MOUTH TWICE DAILY. RINSE MOUTH AFTER USE 180 each 1  . metFORMIN (GLUCOPHAGE) 500 MG tablet TAKE 1 TABLET (500 MG TOTAL) BY MOUTH 2 (TWO) TIMES DAILY WITH A MEAL. 180 tablet 2  . montelukast (SINGULAIR) 10 MG tablet TAKE 1 TABLET BY MOUTH EVERY NIGHT AT BEDTIME 90 tablet 3  . simvastatin (ZOCOR) 80 MG tablet TAKE 1 TABLET BY MOUTH EVERYDAY AT BEDTIME 90 tablet 3  . valsartan-hydrochlorothiazide  (DIOVAN-HCT) 160-25 MG tablet TAKE 1 TABLET BY MOUTH EVERY DAY 90 tablet 0  . HYDROcodone-acetaminophen (NORCO/VICODIN) 5-325 MG tablet Take 1 tablet by mouth every 6 (six) hours as needed for moderate pain. (Patient not taking: Reported on 08/31/2020) 20 tablet 0   No facility-administered medications prior to visit.     Per HPI unless specifically indicated in ROS section below Review of Systems  Constitutional:  Negative for activity change, appetite change, chills, fatigue, fever and unexpected weight change.  HENT:  Negative for hearing loss.   Eyes:  Negative for visual disturbance.  Respiratory:  Positive for cough (chronic). Negative for chest tightness, shortness of breath and wheezing.   Cardiovascular:  Negative for chest pain, palpitations and leg swelling.  Gastrointestinal:  Negative for abdominal distention, abdominal pain, blood in stool, constipation, diarrhea, nausea and vomiting.  Genitourinary:  Negative for difficulty urinating and hematuria.  Musculoskeletal:  Negative for arthralgias, myalgias and neck pain.  Skin:  Negative for rash.  Neurological:  Negative for dizziness, seizures, syncope and headaches.  Hematological:  Negative for adenopathy. Does not bruise/bleed easily.  Psychiatric/Behavioral:  Negative for dysphoric mood. The patient is not nervous/anxious.    Objective:  BP 138/82   Pulse 91   Temp 98.3 F (36.8 C) (Temporal)   Ht $R'5\' 10"'vZ$  (1.778 m)   Wt 246 lb (111.6 kg)   SpO2 95%   BMI 35.30 kg/m   Wt Readings from Last 3 Encounters:  08/31/20 246 lb (111.6 kg)  07/12/20 242 lb (109.8 kg)  06/10/20 246 lb 4 oz (111.7 kg)      Physical Exam Vitals and nursing note reviewed.  Constitutional:      General: He is not in acute distress.    Appearance: Normal appearance. He is well-developed. He is not ill-appearing.  HENT:     Head: Normocephalic and atraumatic.     Right Ear: Hearing, tympanic membrane, ear canal and external ear normal.      Left Ear: Hearing, tympanic membrane, ear canal and external ear normal.  Eyes:     General: No scleral icterus.    Extraocular Movements: Extraocular movements intact.     Conjunctiva/sclera: Conjunctivae normal.     Pupils: Pupils are equal, round, and reactive to light.  Neck:     Thyroid: No thyroid mass or thyromegaly.     Vascular: No carotid bruit.  Cardiovascular:     Rate and Rhythm: Normal rate and regular rhythm.     Pulses: Normal pulses.          Radial pulses are 2+ on the right side and 2+ on the left side.     Heart sounds: Murmur (2/6 systolic USB) heard.  Pulmonary:     Effort: Pulmonary effort is normal. No respiratory distress.     Breath sounds: Normal breath sounds. No wheezing, rhonchi or rales.  Abdominal:  General: Bowel sounds are normal. There is no distension.     Palpations: Abdomen is soft. There is no mass.     Tenderness: no abdominal tenderness There is no guarding or rebound.     Hernia: No hernia is present.  Musculoskeletal:        General: Normal range of motion.     Cervical back: Normal range of motion and neck supple.     Right lower leg: No edema.     Left lower leg: No edema.  Lymphadenopathy:     Cervical: No cervical adenopathy.  Skin:    General: Skin is warm and dry.     Findings: No rash.  Neurological:     General: No focal deficit present.     Mental Status: He is alert and oriented to person, place, and time.  Psychiatric:        Mood and Affect: Mood normal.        Behavior: Behavior normal.        Thought Content: Thought content normal.        Judgment: Judgment normal.      Results for orders placed or performed in visit on 08/24/20  PSA  Result Value Ref Range   PSA 0.10 0.10 - 4.00 ng/mL  Hemoglobin A1c  Result Value Ref Range   Hgb A1c MFr Bld 7.2 (H) 4.6 - 6.5 %  Comprehensive metabolic panel  Result Value Ref Range   Sodium 140 135 - 145 mEq/L   Potassium 4.0 3.5 - 5.1 mEq/L   Chloride 100 96 - 112  mEq/L   CO2 31 19 - 32 mEq/L   Glucose, Bld 116 (H) 70 - 99 mg/dL   BUN 18 6 - 23 mg/dL   Creatinine, Ser 0.85 0.40 - 1.50 mg/dL   Total Bilirubin 0.4 0.2 - 1.2 mg/dL   Alkaline Phosphatase 53 39 - 117 U/L   AST 59 (H) 0 - 37 U/L   ALT 74 (H) 0 - 53 U/L   Total Protein 6.8 6.0 - 8.3 g/dL   Albumin 4.4 3.5 - 5.2 g/dL   GFR 92.82 >60.00 mL/min   Calcium 9.3 8.4 - 10.5 mg/dL  Lipid panel  Result Value Ref Range   Cholesterol 190 0 - 200 mg/dL   Triglycerides 217.0 (H) 0.0 - 149.0 mg/dL   HDL 54.80 >39.00 mg/dL   VLDL 43.4 (H) 0.0 - 40.0 mg/dL   Total CHOL/HDL Ratio 3    NonHDL 135.09   VITAMIN D 25 Hydroxy (Vit-D Deficiency, Fractures)  Result Value Ref Range   VITD 51.42 30.00 - 100.00 ng/mL  LDL cholesterol, direct  Result Value Ref Range   Direct LDL 111.0 mg/dL    Assessment & Plan:  This visit occurred during the SARS-CoV-2 public health emergency.  Safety protocols were in place, including screening questions prior to the visit, additional usage of staff PPE, and extensive cleaning of exam room while observing appropriate contact time as indicated for disinfecting solutions.   Problem List Items Addressed This Visit     Vitamin D deficiency    Continue vit D 400 IU daily.        Hyperlipidemia associated with type 2 diabetes mellitus (HCC)    Chronic, deterioration noted with LDL above goal in diabetic. Will change statin to crestor $RemoveBe'40mg'mugFPrEFg$  daily.  The 10-year ASCVD risk score Mikey Bussing DC Jr., et al., 2013) is: 23.8%   Values used to calculate the score:     Age: 3  years     Sex: Male     Is Non-Hispanic African American: No     Diabetic: Yes     Tobacco smoker: No     Systolic Blood Pressure: 423 mmHg     Is BP treated: Yes     HDL Cholesterol: 54.8 mg/dL     Total Cholesterol: 190 mg/dL        Relevant Medications   metFORMIN (GLUCOPHAGE) 500 MG tablet   valsartan-hydrochlorothiazide (DIOVAN-HCT) 160-25 MG tablet   rosuvastatin (CRESTOR) 40 MG tablet    Essential hypertension    Chronic, stable on current regimen - continue valsartan/hctz.        Relevant Medications   valsartan-hydrochlorothiazide (DIOVAN-HCT) 160-25 MG tablet   rosuvastatin (CRESTOR) 40 MG tablet   Asthma    Chronic, stable on advair, singulair, PRN albuterol. Discussed allergen avoidance.        Relevant Medications   albuterol (VENTOLIN HFA) 108 (90 Base) MCG/ACT inhaler   montelukast (SINGULAIR) 10 MG tablet   fluticasone-salmeterol (ADVAIR DISKUS) 250-50 MCG/ACT AEPB   GERD    Stable period on daily PPI - continue        Benign prostatic hyperplasia    Stable period on finasteride       Relevant Medications   finasteride (PROSCAR) 5 MG tablet   Controlled type 2 diabetes mellitus without complication, without long-term current use of insulin (HCC)    A1c trending up - discussed with patient. He will renew efforts at low sugar low carb diabetic diet and weight loss. Return 3 months DM f/u visit. Discussed possibly starting weekly GLP1 RA at that time.        Relevant Medications   metFORMIN (GLUCOPHAGE) 500 MG tablet   valsartan-hydrochlorothiazide (DIOVAN-HCT) 160-25 MG tablet   rosuvastatin (CRESTOR) 40 MG tablet   Healthcare maintenance - Primary    Preventative protocols reviewed and updated unless pt declined. Discussed healthy diet and lifestyle.        Severe obesity (BMI 35.0-39.9) with comorbidity ([place])    Encouraged healthy diet and lifestyle choices to affect sustainable weight loss.        Relevant Medications   metFORMIN (GLUCOPHAGE) 500 MG tablet   Fatty liver disease, nonalcoholic    New transaminitis noted, presumed fatty liver related. Recommend weight loss and better glycemic control.        Hoarseness    S/p vocal cord polyp excision 06/2020 with improvement Richardson Landry)       Metabolic syndrome   S/P minimally invasive aortic valve replacement with bioprosthetic valve   Other Visit Diagnoses     Need for  vaccination against Streptococcus pneumoniae       Relevant Orders   Pneumococcal conjugate vaccine 20-valent (Prevnar 20) (Completed)        Meds ordered this encounter  Medications  . albuterol (VENTOLIN HFA) 108 (90 Base) MCG/ACT inhaler    Sig: Inhale 2 puffs into the lungs every 6 (six) hours as needed for wheezing or shortness of breath.    Dispense:  18 g    Refill:  6  . metFORMIN (GLUCOPHAGE) 500 MG tablet    Sig: Take 1 tablet (500 mg total) by mouth 2 (two) times daily with a meal.    Dispense:  180 tablet    Refill:  3  . finasteride (PROSCAR) 5 MG tablet    Sig: Take 1 tablet (5 mg total) by mouth daily.    Dispense:  90 tablet    Refill:  3  . montelukast (SINGULAIR) 10 MG tablet    Sig: Take 1 tablet (10 mg total) by mouth at bedtime.    Dispense:  90 tablet    Refill:  3  . valsartan-hydrochlorothiazide (DIOVAN-HCT) 160-25 MG tablet    Sig: Take 1 tablet by mouth daily.    Dispense:  90 tablet    Refill:  3  . fluticasone-salmeterol (ADVAIR DISKUS) 250-50 MCG/ACT AEPB    Sig: Inhale 1 puff into the lungs in the morning and at bedtime.    Dispense:  180 each    Refill:  3  . rosuvastatin (CRESTOR) 40 MG tablet    Sig: Take 1 tablet (40 mg total) by mouth daily.    Dispense:  90 tablet    Refill:  3    To replace simvastatin   Orders Placed This Encounter  Procedures  . Pneumococcal conjugate vaccine 20-valent (Prevnar 20)    Patient instructions: Pass by lab to pick up stool kit.  [id number] today  Consider COVID vaccine - I do recommend this.  Consider shingles vaccine  Return in 3 months for diabetes follow up visit   Follow up plan: Return in about 3 months (around 12/01/2020) for follow up visit.  Ria Bush, MD

## 2020-08-31 NOTE — Assessment & Plan Note (Signed)
Chronic, stable on advair, singulair, PRN albuterol. Discussed allergen avoidance.

## 2020-09-01 ENCOUNTER — Other Ambulatory Visit (INDEPENDENT_AMBULATORY_CARE_PROVIDER_SITE_OTHER): Payer: 59

## 2020-09-01 ENCOUNTER — Telehealth: Payer: Self-pay | Admitting: Family Medicine

## 2020-09-01 DIAGNOSIS — Z1211 Encounter for screening for malignant neoplasm of colon: Secondary | ICD-10-CM

## 2020-09-01 NOTE — Addendum Note (Signed)
Addended by: Ria Bush on: 09/01/2020 11:47 PM   Modules accepted: Orders

## 2020-09-01 NOTE — Telephone Encounter (Signed)
Ordered

## 2020-09-01 NOTE — Telephone Encounter (Signed)
Erik Allen called in and stated that they did a fecal test but didn't have an order but they are needing to have an order for it to release the results.

## 2020-09-02 LAB — FECAL OCCULT BLOOD, IMMUNOCHEMICAL: Fecal Occult Bld: NEGATIVE

## 2020-09-02 LAB — FECAL OCCULT BLOOD, GUAIAC
Fecal Occult Blood: NEGATIVE
Fecal Occult Blood: NEGATIVE

## 2020-09-05 ENCOUNTER — Encounter: Payer: Self-pay | Admitting: Family Medicine

## 2020-09-07 ENCOUNTER — Other Ambulatory Visit: Payer: Self-pay | Admitting: Family Medicine

## 2020-09-07 DIAGNOSIS — E119 Type 2 diabetes mellitus without complications: Secondary | ICD-10-CM

## 2020-09-14 LAB — HM DIABETES EYE EXAM

## 2020-09-16 ENCOUNTER — Encounter: Payer: Self-pay | Admitting: Family Medicine

## 2020-12-06 ENCOUNTER — Encounter: Payer: Self-pay | Admitting: Family Medicine

## 2020-12-06 ENCOUNTER — Ambulatory Visit (INDEPENDENT_AMBULATORY_CARE_PROVIDER_SITE_OTHER): Payer: 59 | Admitting: Family Medicine

## 2020-12-06 ENCOUNTER — Other Ambulatory Visit: Payer: Self-pay

## 2020-12-06 VITALS — BP 132/74 | HR 94 | Temp 98.3°F | Ht 70.0 in | Wt 253.0 lb

## 2020-12-06 DIAGNOSIS — Z23 Encounter for immunization: Secondary | ICD-10-CM

## 2020-12-06 DIAGNOSIS — E119 Type 2 diabetes mellitus without complications: Secondary | ICD-10-CM

## 2020-12-06 DIAGNOSIS — R490 Dysphonia: Secondary | ICD-10-CM

## 2020-12-06 DIAGNOSIS — I10 Essential (primary) hypertension: Secondary | ICD-10-CM | POA: Diagnosis not present

## 2020-12-06 LAB — POCT GLYCOSYLATED HEMOGLOBIN (HGB A1C): Hemoglobin A1C: 6.6 % — AB (ref 4.0–5.6)

## 2020-12-06 NOTE — Assessment & Plan Note (Signed)
Encouraged healthy diet and lifestyle choices to affect sustainable weight loss.  ?

## 2020-12-06 NOTE — Progress Notes (Signed)
Patient ID: Erik Allen, male    DOB: January 06, 1956, 64 y.o.   MRN: [id number]  This visit was conducted in person.  BP 132/74   Pulse 94   Temp 98.3 F (36.8 C) (Temporal)   Ht 5\' 10"  (1.778 m)   Wt 253 lb (114.8 kg)   SpO2 96%   BMI 36.30 kg/m    CC: 3 mo DM f/u visit  Subjective:   HPI: Erik Allen is a 65 y.o. male presenting on 12/06/2020 for Diabetes (3 mo f/u)   Hoarseness has recurred about 1 month ago. Planning to see ENT next month.   DM - does regularly check sugars fasting 110-130. Compliant with antihyperglycemic regimen which includes: metformin 500mg  bid. Denies low sugars or hypoglycemic symptoms. Denies paresthesias, blurry vision. Last diabetic eye exam 09/2020. Glucometer brand: accuchek. Last foot exam: 05/2020. DSME: has not completed, declines.  Lab Results  Component Value Date   HGBA1C 6.6 (A) 12/06/2020   Diabetic Foot Exam - Simple   No data filed    Lab Results  Component Value Date   MICROALBUR 2.6 (H) 07/09/2017        Relevant past medical, surgical, family and social history reviewed and updated as indicated. Interim medical history since our last visit reviewed. Allergies and medications reviewed and updated. Outpatient Medications Prior to Visit  Medication Sig Dispense Refill   Accu-Chek FastClix Lancets MISC USE AS DIRECTED TO CHECK BLOOD SUGAR DAILY 102 each 3   albuterol (PROVENTIL) (2.5 MG/3ML) 0.083% nebulizer solution USE 1 VIAL VIA NEBULIZER EVERY 6 HOURS AS NEEDED (Patient taking differently: Take 2.5 mg by nebulization every 6 (six) hours as needed for wheezing or shortness of breath. USE 1 VIAL VIA NEBULIZER EVERY 6 HOURS AS NEEDE) 150 mL 3   albuterol (VENTOLIN HFA) 108 (90 Base) MCG/ACT inhaler Inhale 2 puffs into the lungs every 6 (six) hours as needed for wheezing or shortness of breath. 18 g 6   ascorbic acid (VITAMIN C) 500 MG tablet Take 1 tablet (500 mg total) by mouth daily. 30 tablet 0   aspirin 81 MG EC tablet  Take 1 tablet (81 mg total) by mouth daily. 30 tablet 0   benzonatate (TESSALON) 100 MG capsule Take 200 mg by mouth every 8 (eight) hours as needed.     cholecalciferol (VITAMIN D3) 10 MCG (400 UNIT) TABS tablet Take 400 Units by mouth daily.     cyanocobalamin (V-R VITAMIN B-12) 500 MCG tablet Take 1 tablet (500 mcg total) by mouth daily.     finasteride (PROSCAR) 5 MG tablet Take 1 tablet (5 mg total) by mouth daily. 90 tablet 3   fluticasone-salmeterol (ADVAIR DISKUS) 250-50 MCG/ACT AEPB Inhale 1 puff into the lungs in the morning and at bedtime. 180 each 3   glucose blood (ACCU-CHEK GUIDE) test strip USE AS INSTRUCTED TO CHECK BLOOD SUGAR DAILY 100 strip 3   metFORMIN (GLUCOPHAGE) 500 MG tablet Take 1 tablet (500 mg total) by mouth 2 (two) times daily with a meal. 180 tablet 3   montelukast (SINGULAIR) 10 MG tablet Take 1 tablet (10 mg total) by mouth at bedtime. 90 tablet 3   pantoprazole (PROTONIX) 40 MG tablet Take 40 mg by mouth at bedtime.     rosuvastatin (CRESTOR) 40 MG tablet Take 1 tablet (40 mg total) by mouth daily. 90 tablet 3   valsartan-hydrochlorothiazide (DIOVAN-HCT) 160-25 MG tablet Take 1 tablet by mouth daily. 90 tablet 3   zinc sulfate  220 (50 Zn) MG capsule Take 1 capsule (220 mg total) by mouth daily. 30 capsule 0   amoxicillin (AMOXIL) 875 MG tablet Take 1 tablet (875 mg total) by mouth 2 (two) times daily. 20 tablet 0   No facility-administered medications prior to visit.     Per HPI unless specifically indicated in ROS section below Review of Systems  Objective:  BP 132/74   Pulse 94   Temp 98.3 F (36.8 C) (Temporal)   Ht 5\' 10"  (1.778 m)   Wt 253 lb (114.8 kg)   SpO2 96%   BMI 36.30 kg/m   Wt Readings from Last 3 Encounters:  12/06/20 253 lb (114.8 kg)  08/31/20 246 lb (111.6 kg)  07/12/20 242 lb (109.8 kg)      Physical Exam Vitals and nursing note reviewed.  Constitutional:      Appearance: Normal appearance. He is not ill-appearing.  HENT:      Mouth/Throat:     Mouth: Mucous membranes are moist.     Pharynx: Oropharynx is clear. No oropharyngeal exudate or posterior oropharyngeal erythema.  Eyes:     Extraocular Movements: Extraocular movements intact.     Conjunctiva/sclera: Conjunctivae normal.     Pupils: Pupils are equal, round, and reactive to light.  Cardiovascular:     Rate and Rhythm: Normal rate and regular rhythm.     Pulses: Normal pulses.     Heart sounds: Murmur (bioprosthetic systolic murmur) heard.  Pulmonary:     Effort: Pulmonary effort is normal. No respiratory distress.     Breath sounds: Normal breath sounds. No wheezing, rhonchi or rales.  Musculoskeletal:     Right lower leg: No edema.     Left lower leg: No edema.     Comments: See HPI for foot exam if done  Skin:    General: Skin is warm and dry.     Findings: No rash.  Neurological:     Mental Status: He is alert.  Psychiatric:        Mood and Affect: Mood normal.        Behavior: Behavior normal.      Results for orders placed or performed in visit on 12/06/20  POCT glycosylated hemoglobin (Hb A1C)  Result Value Ref Range   Hemoglobin A1C 6.6 (A) 4.0 - 5.6 %   HbA1c POC (<> result, manual entry)     HbA1c, POC (prediabetic range)     HbA1c, POC (controlled diabetic range)      Assessment & Plan:  This visit occurred during the SARS-CoV-2 public health emergency.  Safety protocols were in place, including screening questions prior to the visit, additional usage of staff PPE, and extensive cleaning of exam room while observing appropriate contact time as indicated for disinfecting solutions.   Problem List Items Addressed This Visit     Essential hypertension    Chronic, stable. Continue current regimen. Monitor pulse as mildly elevated today - encouraged increased water intake.       Controlled type 2 diabetes mellitus without complication, without long-term current use of insulin (HCC) - Primary    Chronic, improved with A1c  down to 6.6%.  Declines starting GLP1RA at this time.  RTC 4-5 mo DM f/u visit.       Relevant Orders   POCT glycosylated hemoglobin (Hb A1C) (Completed)   Severe obesity (BMI 35.0-39.9) with comorbidity ([place])    Encouraged healthy diet and lifestyle choices to affect sustainable weight loss.  Hoarseness    Recurrent hoarseness.  He has ENT f/u planned for next month.       Other Visit Diagnoses     Need for influenza vaccination       Relevant Orders   Flu Vaccine QUAD 6+ mos PF IM (Fluarix Quad PF) (Completed)        No orders of the defined types were placed in this encounter.  Orders Placed This Encounter  Procedures   Flu Vaccine QUAD 6+ mos PF IM (Fluarix Quad PF)   POCT glycosylated hemoglobin (Hb A1C)     Patient Instructions  Flu shot today  You are doing well today.  Continue working on low sugar low carb diet.  Return as needed or in 4-5 months for diabetes follow up visit.   Follow up plan: Return in about 4 months (around 04/08/2021) for follow up visit.  Ria Bush, MD

## 2020-12-06 NOTE — Assessment & Plan Note (Signed)
Chronic, improved with A1c down to 6.6%.  Declines starting GLP1RA at this time.  RTC 4-5 mo DM f/u visit.

## 2020-12-06 NOTE — Assessment & Plan Note (Addendum)
Recurrent hoarseness.  He has ENT f/u planned for next month.

## 2020-12-06 NOTE — Assessment & Plan Note (Signed)
Chronic, stable. Continue current regimen. Monitor pulse as mildly elevated today - encouraged increased water intake.

## 2020-12-06 NOTE — Patient Instructions (Addendum)
Flu shot today  You are doing well today.  Continue working on low sugar low carb diet.  Return as needed or in 4-5 months for diabetes follow up visit.

## 2021-01-09 ENCOUNTER — Encounter: Payer: Self-pay | Admitting: Cardiology

## 2021-01-09 ENCOUNTER — Other Ambulatory Visit: Payer: Self-pay

## 2021-01-09 ENCOUNTER — Ambulatory Visit (INDEPENDENT_AMBULATORY_CARE_PROVIDER_SITE_OTHER): Payer: 59 | Admitting: Cardiology

## 2021-01-09 VITALS — BP 144/80 | HR 98 | Ht 70.0 in | Wt 245.0 lb

## 2021-01-09 DIAGNOSIS — I10 Essential (primary) hypertension: Secondary | ICD-10-CM

## 2021-01-09 DIAGNOSIS — E7800 Pure hypercholesterolemia, unspecified: Secondary | ICD-10-CM | POA: Diagnosis not present

## 2021-01-09 DIAGNOSIS — Z6835 Body mass index (BMI) 35.0-35.9, adult: Secondary | ICD-10-CM

## 2021-01-09 DIAGNOSIS — Z953 Presence of xenogenic heart valve: Secondary | ICD-10-CM | POA: Diagnosis not present

## 2021-01-09 NOTE — Progress Notes (Signed)
Cardiology Office Note:    Date:  01/09/2021   ID:  Erik Allen, DOB [birth year], MRN [id number]  PCP:  Ria Bush, MD  Cardiologist:  Kate Sable, MD  Electrophysiologist:  None   Referring MD: Ria Bush, MD   Chief Complaint  Patient presents with   Other    12 month follow up -- Meds reviewed verbally with patient.      History of Present Illness:    Erik Allen is a 65 y.o. male with a hx of aortic stenosis s/p aortic valve replacement on 04/2019, hypertension, hyperlipidemia who presents for follow-up.   Being seen due to s/p aortic valve replacement.  Denies chest pain or shortness of breath.  Checks BP frequently at home systolic usually in the 811X.  Compliant with medications as prescribed.  States having pain in his knees bilaterally.  May have arthritis, follows up with PCP for this.   Historical notes Echo 08/2019 EF 60 to 65%, normal functioning prosthetic valve. He underwent a minimally invasive aortic valve replacement with a bioprosthetic valve on 04/29/2019.  Postoperative course has been uneventful.    Past Medical History:  Diagnosis Date   Aortic stenosis 06/21/2014   Echo 01/2018: Mild LVH, normal sys fxn EF 55-60%, G1DD, moderate AS with mildly dilated LA. rec yearly echocardiogram   Asthma 09/13/99   BENIGN PROSTATIC HYPERTROPHY 05/14/2003   CAP (community acquired pneumonia) 01/31/2015   COVID-19 virus infection 06/2019   Diabetes mellitus without complication (HCC)    Disorder of vocal cord 2012   h/o thrush on vocal cords per patient   GERD (gastroesophageal reflux disease) 10/13/01   ENT eval for GERD and thrush 2011   History of kidney stones    HTN (hypertension)    Hyperlipemia 10/13/97   S/P minimally invasive aortic valve replacement with bioprosthetic valve 04/29/2019   [facility] Inspiris Resilia stented bovine pericardial tissue valve (size 23 mm)   Seasonal allergic rhinitis    worse in spring   Systolic murmur     Transaminitis 2014   presumed fatty liver without [place], normal iron and viral hep panel   Vertigo    none - over 1 year    Past Surgical History:  Procedure Laterality Date   A1 pulley release  10/2010   stensosing tenosynovitis, R milddle, ring, little fingers   AORTIC VALVE REPLACEMENT N/A 04/29/2019   Procedure: MINIMALLY INVASIVE AORTIC VALVE REPLACEMENT (AVR) USING INSPIRIS VALVE SIZE 23MM;  Surgeon: Rexene Alberts, MD;  Location: [facility];  Service: Open Heart Surgery;  Laterality: N/A;   COLONOSCOPY  2010   WNL, rpt due 10 yrs   CYSTECTOMY     from back   CYSTOSCOPY  09/2000   Stone retrieval   [name] N/A 07/12/2020   DIRECT LARYNGOSCOPY WITH EXCISION OF VOCAL CORD NODULE Richardson Landry, Eddie Dibbles, MD)   EAR CYST EXCISION Left 08/02/2016   Procedure: EXCISION MUCOID CYST LEFT MIDDLE FINGER WITH DISTAL INTERPHALANGEAL JOINT [place];  Surgeon: Daryll Brod, MD;  Location: [facility];  Service: Orthopedics;  Laterality: Left;   FOOT CAPSULE RELEASE W/ PERCUTANEOUS HEEL CORD LENGTHENING, TIBIAL TENDON TRANSFER  05/2007   bilateral, Dr. Orlene Plum HERNIA REPAIR  06/28/2009   left, with mesh, Dr. Bary Castilla   RIGHT/LEFT HEART CATH AND CORONARY ANGIOGRAPHY Bilateral 04/06/2019   Procedure: RIGHT/LEFT HEART CATH AND CORONARY ANGIOGRAPHY;  Surgeon: Wellington [place], MD;  Location: [facility];  Service: Cardiovascular;  Laterality: Bilateral;  ROTATION FLAP Left 08/02/2016   Procedure: ROTATION DORSAL FLAP;  Surgeon: Daryll Brod, MD;  Location: [facility];  Service: Orthopedics;  Laterality: Left;   TEE WITHOUT CARDIOVERSION N/A 04/29/2019   Procedure: TRANSESOPHAGEAL ECHOCARDIOGRAM (TEE);  Surgeon: Rexene Alberts, MD;  Location: [facility];  Service: Open Heart Surgery;  Laterality: N/A;   US ECHOCARDIOGRAPHY  10/1999   TR, MR    Current Medications: Current Meds  Medication Sig   Accu-Chek FastClix Lancets MISC USE AS DIRECTED TO  CHECK BLOOD SUGAR DAILY   albuterol (PROVENTIL) (2.5 MG/3ML) 0.083% nebulizer solution USE 1 VIAL VIA NEBULIZER EVERY 6 HOURS AS NEEDED   albuterol (VENTOLIN HFA) 108 (90 Base) MCG/ACT inhaler Inhale 2 puffs into the lungs every 6 (six) hours as needed for wheezing or shortness of breath.   ascorbic acid (VITAMIN C) 500 MG tablet Take 1 tablet (500 mg total) by mouth daily.   aspirin 81 MG EC tablet Take 1 tablet (81 mg total) by mouth daily.   benzonatate (TESSALON) 100 MG capsule Take 200 mg by mouth every 8 (eight) hours as needed.   cholecalciferol (VITAMIN D3) 10 MCG (400 UNIT) TABS tablet Take 400 Units by mouth daily.   cyanocobalamin (V-R VITAMIN B-12) 500 MCG tablet Take 1 tablet (500 mcg total) by mouth daily.   finasteride (PROSCAR) 5 MG tablet Take 1 tablet (5 mg total) by mouth daily.   fluticasone-salmeterol (ADVAIR DISKUS) 250-50 MCG/ACT AEPB Inhale 1 puff into the lungs in the morning and at bedtime.   glucose blood (ACCU-CHEK GUIDE) test strip USE AS INSTRUCTED TO CHECK BLOOD SUGAR DAILY   metFORMIN (GLUCOPHAGE) 500 MG tablet Take 1 tablet (500 mg total) by mouth 2 (two) times daily with a meal.   montelukast (SINGULAIR) 10 MG tablet Take 1 tablet (10 mg total) by mouth at bedtime.   pantoprazole (PROTONIX) 40 MG tablet Take 40 mg by mouth at bedtime.   rosuvastatin (CRESTOR) 40 MG tablet Take 1 tablet (40 mg total) by mouth daily.   valsartan-hydrochlorothiazide (DIOVAN-HCT) 160-25 MG tablet Take 1 tablet by mouth daily.   zinc sulfate 220 (50 Zn) MG capsule Take 1 capsule (220 mg total) by mouth daily.     Allergies:   Ace inhibitors, Codeine, and Tramadol   Social History   Socioeconomic History   Marital status: Single    Spouse name: Not on file   Number of children: Not on file   Years of education: Not on file   Highest education level: Not on file  Occupational History   Occupation: TEAM LEADER    Employer: [employer]    Comment: Sheet metal fabrication   Tobacco Use   Smoking status: Never   Smokeless tobacco: Never  Vaping Use   Vaping Use: Never used  Substance and Sexual Activity   Alcohol use: Yes    Alcohol/week: 1.0 - 2.0 standard drink    Types: 1 - 2 Standard drinks or equivalent per week    Comment: occassionally   Drug use: No   Sexual activity: Yes  Other Topics Concern   Not on file  Social History Narrative   Caffeine: a couple mountain dews/day, occasional coffee   Lives alone, no pets   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  ROS:   Please see the history of present illness.     All other systems reviewed and are negative.  EKGs/Labs/Other Studies Reviewed:    The following studies were reviewed today:   EKG:  EKG is  ordered today.  The ekg ordered today demonstrates normal sinus rhythm, PACs.  Recent Labs: 08/24/2020: ALT 74; BUN 18; Creatinine, Ser 0.85; Potassium 4.0; Sodium 140  Recent Lipid Panel    Component Value Date/Time   CHOL 190 08/24/2020 0805   TRIG 217.0 (H) 08/24/2020 0805   HDL 54.80 08/24/2020 0805   CHOLHDL 3 08/24/2020 0805   VLDL 43.4 (H) 08/24/2020 0805   LDLCALC 70 08/14/2019 0753   LDLDIRECT 111.0 08/24/2020 0805    Physical Exam:    VS:  BP (!) 144/80 (BP Location: Left Arm, Patient Position: Sitting, Cuff Size: Normal)   Pulse 98   Ht 5\' 10"  (1.778 m)   Wt 245 lb (111.1 kg)   SpO2 97%   BMI 35.15 kg/m     Wt Readings from Last 3 Encounters:   01/09/21 245 lb (111.1 kg)  12/06/20 253 lb (114.8 kg)  08/31/20 246 lb (111.6 kg)     GEN:  Well nourished, well developed in no acute distress HEENT: Normal NECK: No JVD; No carotid bruits LYMPHATICS: No lymphadenopathy CARDIAC: RRR, 1/6 systolic murmur, rusb, no gallops RESPIRATORY:  Clear to auscultation without rales, wheezing or rhonchi  ABDOMEN: Soft, non-tender, non-distended MUSCULOSKELETAL:  No edema; No deformity  SKIN: Warm and dry NEUROLOGIC:  Alert and oriented x 3 PSYCHIATRIC:  Normal affect   ASSESSMENT:    1. S/P minimally invasive aortic valve replacement with bioprosthetic valve   2. Essential hypertension   3. Pure hypercholesterolemia   4. BMI 35.0-35.9,adult     PLAN:    In order of problems listed above:  severe aortic stenosis s/p aortic valve replacement with a bioprosthetic valve on 04/2019, minimally invasive approach.  Postop course has been unremarkable.  Echo 08/20/2019 showed normal systolic function, EF 60 to 65%, impaired relaxation, normal functioning aortic valve prosthesis.  Cont asa 81mg  qd. Hypertension, BP elevated, usually well controlled.  Continue valsartan, HCTZ. History of hyperlipidemia, continue simvastatin. Obesity, low-calorie diet, weight loss advised.  Follow-up yearly   Medication Adjustments/Labs and Tests Ordered: Current medicines are reviewed at length with the patient today.  Concerns regarding medicines are outlined above.  Orders Placed This Encounter  Procedures   EKG 12-Lead    No orders of the defined types were placed in this encounter.   Patient Instructions  Medication Instructions:  Your physician recommends that you continue on your current medications as directed. Please refer to the Current Medication list given to you today.  *If you need a refill on your cardiac medications before your next appointment, please call your pharmacy*   Lab Work: None ordered If you have labs (blood work) drawn  today and your tests are completely normal, you will receive your results only by: [place] (if you have MyChart) OR A paper copy in the mail If you have any lab test that is abnormal or we need to change your treatment, we  will call you to review the results.   Testing/Procedures: None ordered   Follow-Up: At [facility], you and your health needs are our priority.  As part of our continuing mission to provide you with exceptional heart care, we have created designated Provider Care Teams.  These Care Teams include your primary Cardiologist (physician) and Advanced Practice Providers (APPs -  Physician Assistants and Nurse Practitioners) who all work together to provide you with the care you need, when you need it.  We recommend signing up for the patient portal called "MyChart".  Sign up information is provided on this After Visit Summary.  MyChart is used to connect with patients for Virtual Visits (Telemedicine).  Patients are able to view lab/test results, encounter notes, upcoming appointments, etc.  Non-urgent messages can be sent to your provider as well.   To learn more about what you can do with MyChart, go to [email or web address].    Your next appointment:   1 year(s)  The format for your next appointment:   In Person  Provider:   You may see Kate Sable, MD or one of the following Advanced Practice Providers on your designated Care Team:   Murray Hodgkins, NP Christell Faith, PA-C Cadence Kathlen Mody, [place]    Other Instructions    Signed, Kate Sable, MD  01/09/2021 11:41 AM    [place]

## 2021-01-09 NOTE — Patient Instructions (Signed)

## 2021-01-10 IMAGING — CT CT ANGIO CHEST
3 of 18 series · 11 of 37 positions shown · IV contrast (APPLIED)
Comparison: None.

CLINICAL DATA: 63-year-old male with history of severe aortic
stenosis. Preprocedural study prior to potential transcatheter
aortic valve replacement (TAVR) procedure.

EXAM:
CT ANGIOGRAPHY CHEST, ABDOMEN AND PELVIS
TECHNIQUE: Multidetector CT imaging through the chest, abdomen and pelvis was
performed using the standard protocol during bolus administration of
intravenous contrast. Multiplanar reconstructed images and MIPs were
obtained and reviewed to evaluate the vascular anatomy.
CONTRAST:  100mL OMNIPAQUE IOHEXOL 350 MG/ML SOLN

[Series 10: 5-95% · axial · 0.37mm/px · z∈[+1202,+1269]mm · 2 of 3350 slices shown]
[im 1117/3350  lung]
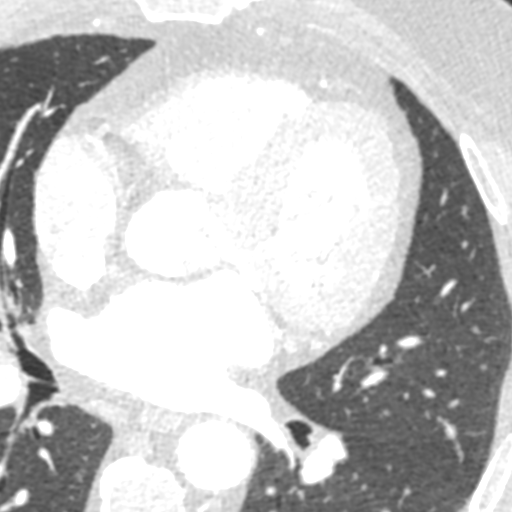
[im 2233/3350  lung]
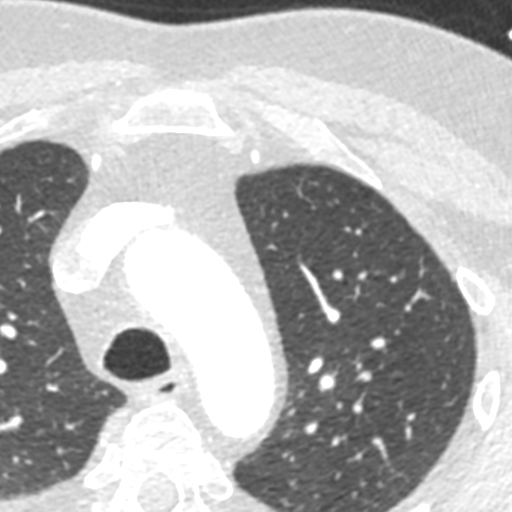

[Series 21: 0-90% · axial · 0.37mm/px · z∈[+1160,+1311]mm · 7 of 7033 slices shown (1 of 2)]
[im 880/7033  lung]
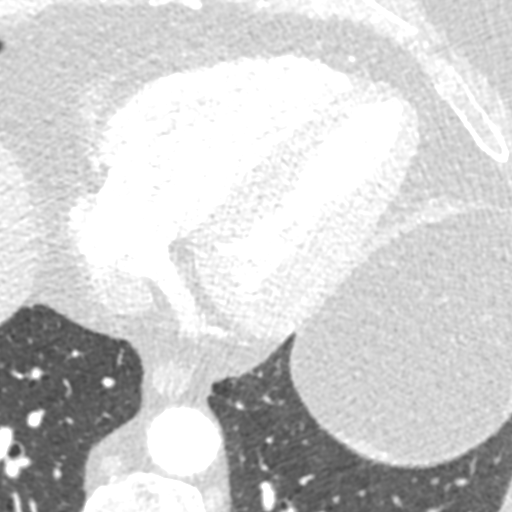
[im 1759/7033  mediastinal]
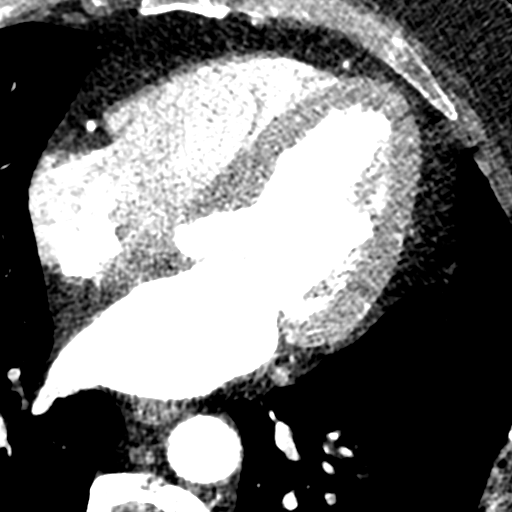
[im 2638/7033  lung]
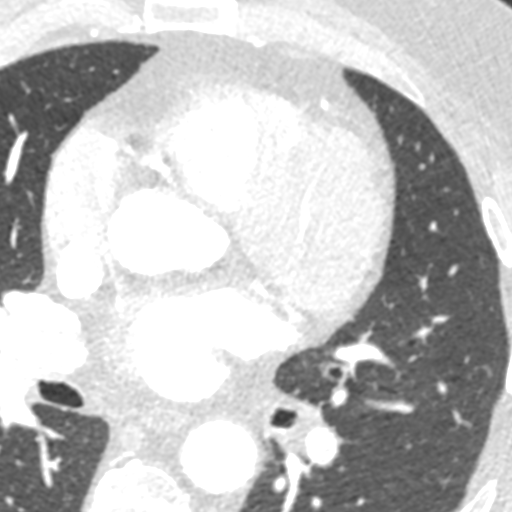
[im 3517/7033  mediastinal]
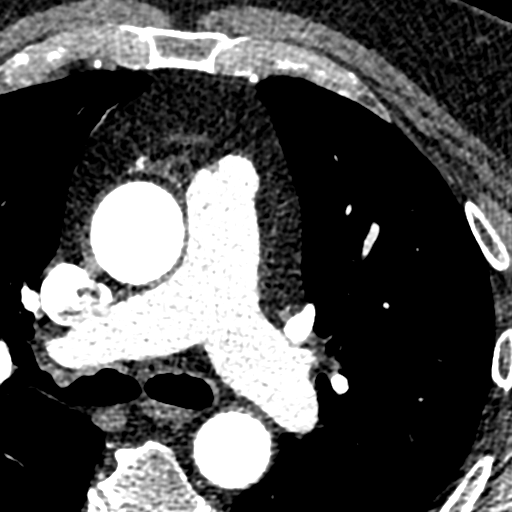
[im 4396/7033  lung]
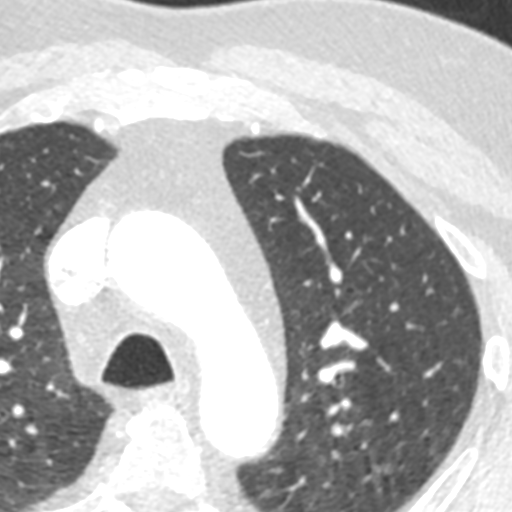
[im 5275/7033  mediastinal]
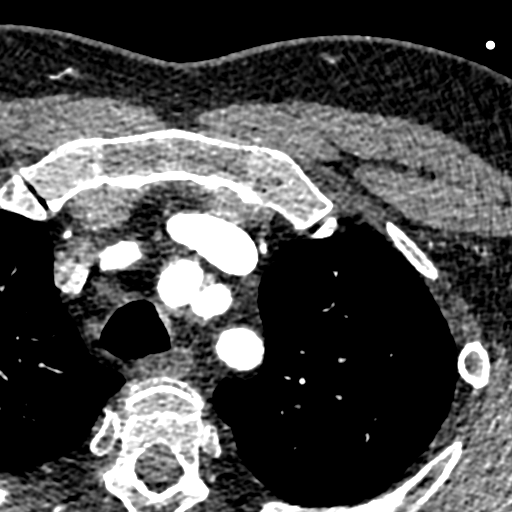
[im 6154/7033  lung]
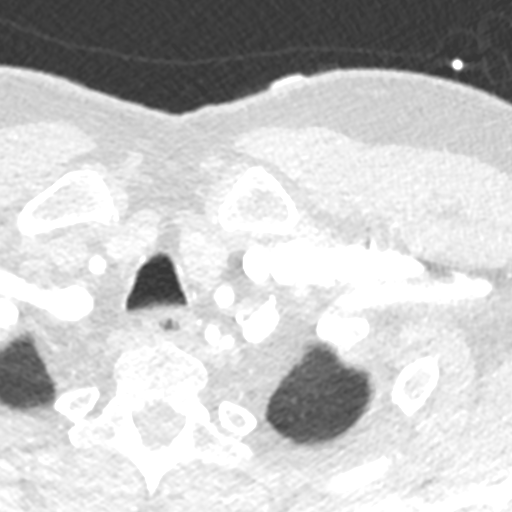

[Series 22: 0-90% · axial · 0.37mm/px · z∈[+1202,+1269]mm · 2 of 3683 slices shown (2 of 2)]
[im 1228/3683  lung]
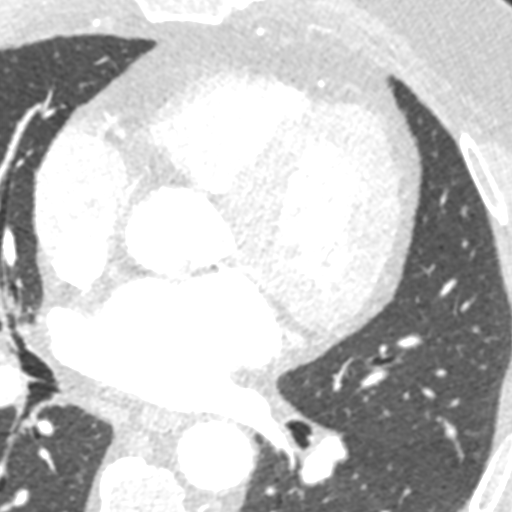
[im 2455/3683  lung]
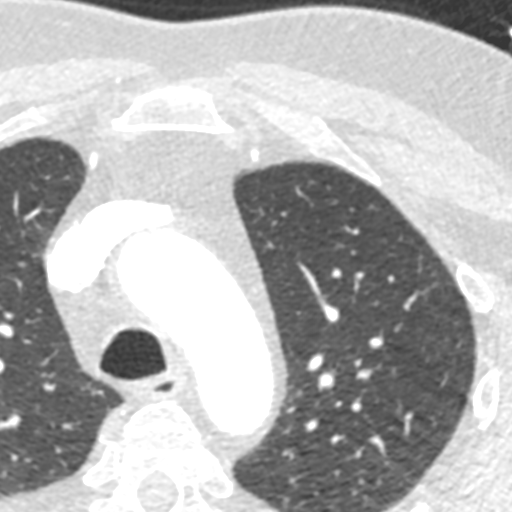

[11 of 37 positions shown; findings below may reference images not displayed]

FINDINGS: CTA CHEST FINDINGS

Cardiovascular: Heart size is normal. There is no significant
pericardial fluid, thickening or pericardial calcification. There is
aortic atherosclerosis, as well as atherosclerosis of the great
vessels of the mediastinum and the coronary arteries, including
calcified atherosclerotic plaque in the left anterior descending
coronary artery. Severe thickening and calcification of the aortic
valve and moderate calcification of the inferior mitral annulus.

Mediastinum/Lymph Nodes: No pathologically enlarged mediastinal or
hilar lymph nodes. Esophagus is unremarkable in appearance. No
axillary lymphadenopathy.

Lungs/Pleura: Tiny pulmonary nodules are noted in the right upper
lobe, largest of which measures only 4 mm (axial image 45 of series
16). No acute consolidative airspace disease. No pleural effusions.

Musculoskeletal/Soft Tissues: There are no aggressive appearing
lytic or blastic lesions noted in the visualized portions of the
skeleton.

CTA ABDOMEN AND PELVIS FINDINGS

Hepatobiliary: No suspicious cystic or solid hepatic lesions.
Diffuse low attenuation throughout the hepatic parenchyma,
indicative of hepatic steatosis. No intra or extrahepatic biliary
ductal dilatation. Gallbladder is normal in appearance.

Pancreas: No pancreatic mass. No pancreatic ductal dilatation. No
pancreatic or peripancreatic fluid collections or inflammatory
changes.

Spleen: Unremarkable.

Adrenals/Urinary Tract: Bilateral kidneys and adrenal glands are
normal in appearance. No hydroureteronephrosis. Urinary bladder is
grossly unremarkable in appearance.

Stomach/Bowel: Normal appearance of the stomach. Small periampullary
duodenal diverticulum, without surrounding inflammatory changes. No
pathologic dilatation of small bowel or colon. Multiple colonic
diverticulae are noted, without surrounding inflammatory changes to
suggest an acute diverticulitis at this time. Normal appendix.

Vascular/Lymphatic: Vascular findings and measurements pertinent to
potential TAVR procedure, as detailed above. Aortic atherosclerosis,
without evidence of aneurysm or dissection in the abdominal or
pelvic vasculature. No lymphadenopathy noted in the abdomen or
pelvis.

Reproductive: Prostate gland and seminal vesicles are unremarkable
in appearance.

Other: No significant volume of ascites.  No pneumoperitoneum.

Musculoskeletal: There are no aggressive appearing lytic or blastic
lesions noted in the visualized portions of the skeleton.

VASCULAR MEASUREMENTS PERTINENT TO TAVR:

AORTA:

Minimal Aortic Hiameter-BQ x 17 mm

Severity of Aortic Calcification-minimal

RIGHT PELVIS:

Right Common Iliac Artery -

Minimal Qiameter-XX.V x 10.5 mm

Tortuosity-mild

Calcification-none

Right External Iliac Artery -

Minimal 5iameter-8.D x 8.2 mm

Tortuosity-moderate

Calcification-none

Right Common Femoral Artery -

Minimal 2iameter-B.2 x 7.5 mm

Tortuosity-mild

Calcification-none

LEFT PELVIS:

Left Common Iliac Artery -

Minimal Diameter-XX.X x 10.1 mm

Tortuosity-mild

Calcification-minimal

Left External Iliac Artery -

Minimal Kiameter-3.C x 8.5 mm

Tortuosity-moderate

Calcification-none

Left Common Femoral Artery -

Minimal Aiameter-1.4 x 8.0 mm

Tortuosity-mild

Calcification-none

Review of the MIP images confirms the above findings.
IMPRESSION: 1. Vascular findings and measurements pertinent to potential TAVR
procedure, as detailed above.
2. Severe thickening calcification of the aortic valve, compatible
with the reported clinical history of severe aortic stenosis.
3. Aortic atherosclerosis, in addition to left anterior descending
coronary artery disease.
4. 4 mm right upper lobe pulmonary nodule, nonspecific, but
statistically likely benign. No follow-up needed if patient is
low-risk. Non-contrast chest CT can be considered in 12 months if
patient is high-risk. This recommendation follows the consensus
statement: Guidelines for Management of Incidental Pulmonary Nodules
Detected on CT Images: From the [HOSPITAL] 5308; Radiology
5. Additional incidental findings, as above.

## 2021-01-24 IMAGING — DX DG CHEST 1V PORT
1 series · 1 of 1 positions shown · non-contrast
Comparison: Yesterday

CLINICAL DATA: Chest tube.  Open-heart surgery

EXAM:
PORTABLE CHEST 1 VIEW

[chest]
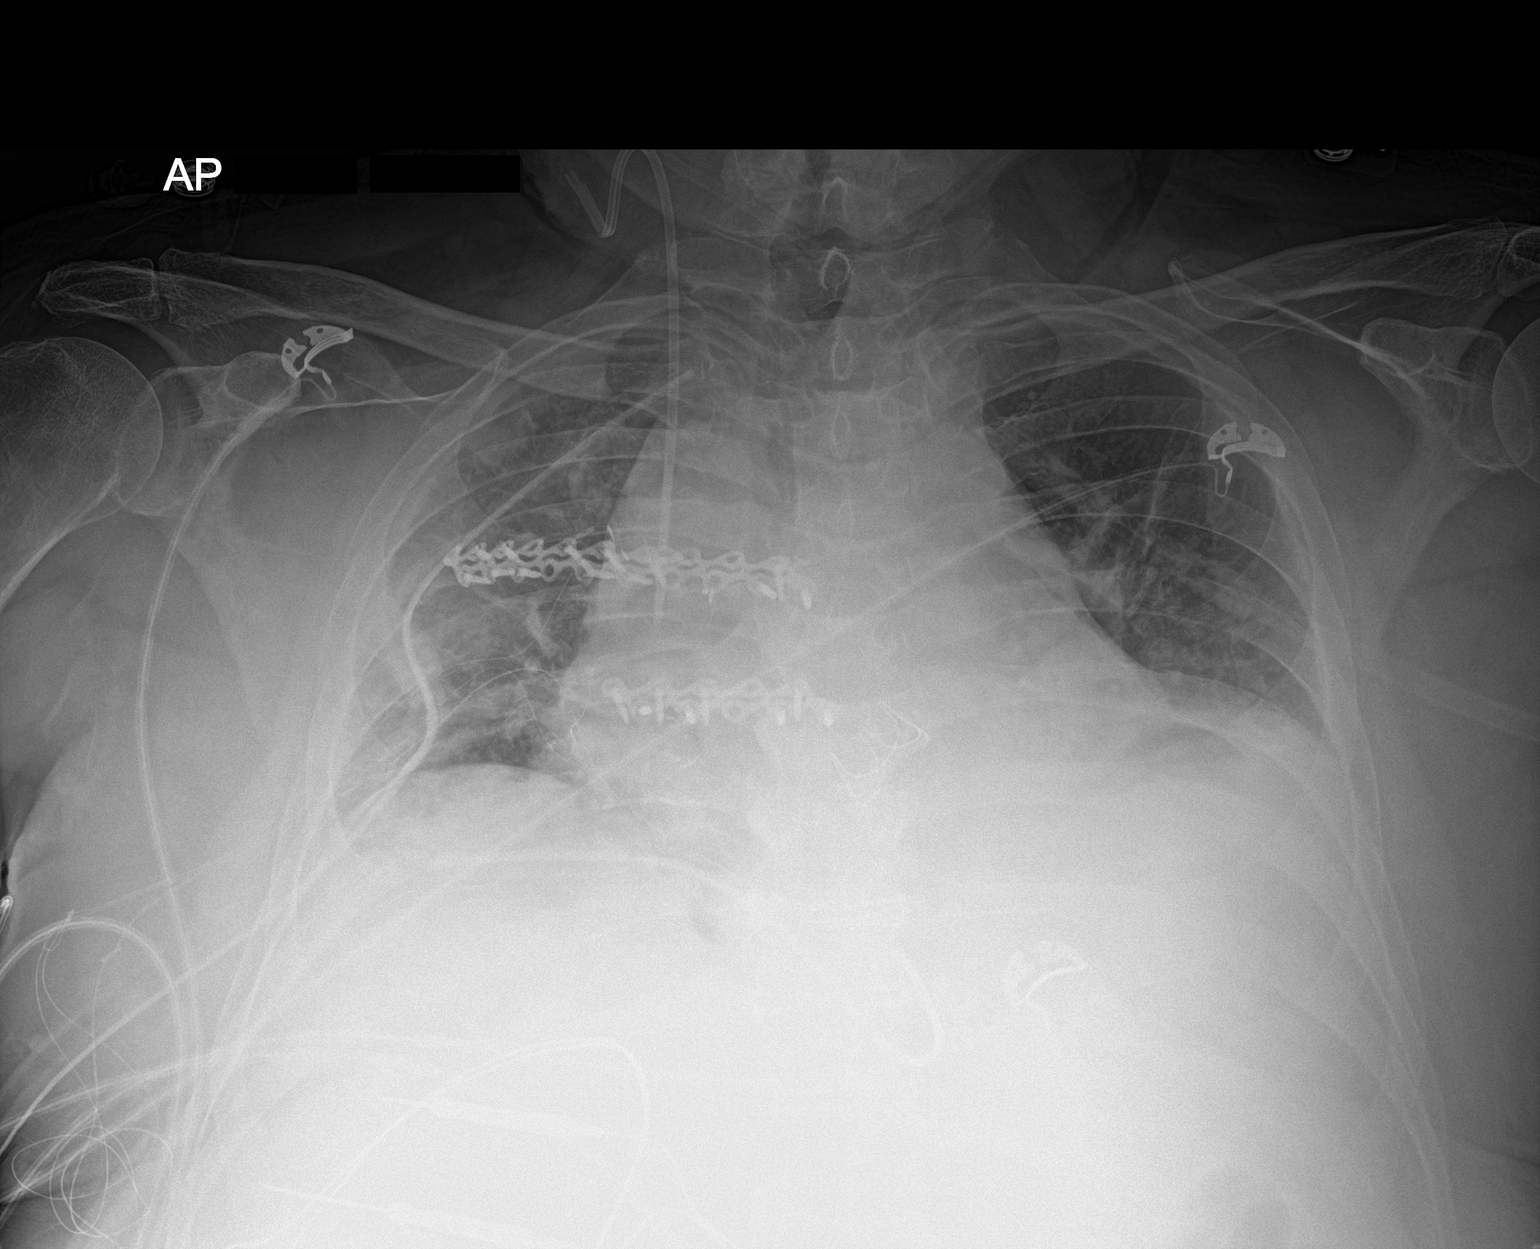

[1 of 1 positions shown; findings below may reference images not displayed]

FINDINGS: The left IJ line has been removed. Right IJ line in stable position.
Chest tubes in place. Stable cardiomegaly and vascular pedicle
widening. Aortic valve replacement. Very low volume chest with
presumed atelectasis. No visible pneumothorax.
IMPRESSION: 1. Very low volume chest with presumed atelectasis, stable.
2. No visible pneumothorax.

## 2021-04-07 ENCOUNTER — Other Ambulatory Visit: Payer: Self-pay

## 2021-04-07 ENCOUNTER — Encounter: Payer: Self-pay | Admitting: Family Medicine

## 2021-04-07 ENCOUNTER — Ambulatory Visit (INDEPENDENT_AMBULATORY_CARE_PROVIDER_SITE_OTHER): Payer: 59 | Admitting: Family Medicine

## 2021-04-07 VITALS — BP 140/66 | HR 95 | Temp 98.1°F | Ht 70.0 in | Wt 243.4 lb

## 2021-04-07 DIAGNOSIS — Z953 Presence of xenogenic heart valve: Secondary | ICD-10-CM

## 2021-04-07 DIAGNOSIS — E119 Type 2 diabetes mellitus without complications: Secondary | ICD-10-CM

## 2021-04-07 DIAGNOSIS — K760 Fatty (change of) liver, not elsewhere classified: Secondary | ICD-10-CM | POA: Diagnosis not present

## 2021-04-07 DIAGNOSIS — E669 Obesity, unspecified: Secondary | ICD-10-CM

## 2021-04-07 DIAGNOSIS — R490 Dysphonia: Secondary | ICD-10-CM

## 2021-04-07 DIAGNOSIS — B351 Tinea unguium: Secondary | ICD-10-CM

## 2021-04-07 DIAGNOSIS — E66811 Obesity, class 1: Secondary | ICD-10-CM

## 2021-04-07 LAB — POCT GLYCOSYLATED HEMOGLOBIN (HGB A1C): Hemoglobin A1C: 6.7 % — AB (ref 4.0–5.6)

## 2021-04-07 MED ORDER — RYBELSUS 3 MG PO TABS: 3.0000 mg | ORAL_TABLET | Freq: Every day | ORAL | 0 refills | Status: DC

## 2021-04-07 MED ORDER — RYBELSUS 7 MG PO TABS: 7.0000 mg | ORAL_TABLET | Freq: Every day | ORAL | 6 refills | Status: DC

## 2021-04-07 NOTE — Assessment & Plan Note (Signed)
To R great toenail.  Likely too extensive for topical antifungal, hesitant to use oral antifungal in transaminitis history.  Will update LFTs at CPE and decide on oral antifungal course at that time.

## 2021-04-07 NOTE — Assessment & Plan Note (Signed)
Chronic, stable on metformin 500mg  bid.  Discussed GLP1RA and its benefits. Declines injectable medication.  Agrees to price out oral rybelsus.  Reviewed possible side effects.  No fmhx thyroid cancer.

## 2021-04-07 NOTE — Assessment & Plan Note (Signed)
Trial oral GLP1RA.

## 2021-04-07 NOTE — Patient Instructions (Addendum)
You are doing well today  Continue current medicines. Price out rybelsus 3mg  daily for the first month then 7mg  daily. Let us know if any difficulty tolerating medicine.  Return in 5 months for physical.   Semaglutide Tablets What is this medication? SEMAGLUTIDE (SEM a GLOO tide) treats type 2 diabetes. It works by increasing insulin levels in your body, which decreases your blood sugar (glucose). It also reduces the amount of sugar released into the blood and slows down your digestion. Changes to diet and exercise are often combined with this medication. This medicine may be used for other purposes; ask your health care provider or pharmacist if you have questions. COMMON BRAND NAME(S): Rybelsus What should I tell my care team before I take this medication? They need to know if you have any of these conditions: Endocrine tumors (MEN 2) or if someone in your family had these tumors Eye disease History of pancreatitis Kidney disease Stomach or intestine problems Thyroid cancer or if someone in your family had thyroid cancer Vision problems An unusual or allergic reaction to semaglutide, other medications, foods, dyes, or preservatives Pregnant or trying to get pregnant Breast-feeding How should I use this medication? Take this medication by mouth. Take it as directed on the prescription label at the same time every day. Take the dose right after waking up. Do not eat or drink anything before taking it. Do not take it with any other drink except a glass of plain water that is less than 4 ounces (less than 120 mL). Do not cut, crush or chew this medication. Swallow the tablets whole. After taking it, do not eat breakfast, drink, or take any other medications or vitamins for at least 30 minutes. Keep taking it unless your care team tells you to stop. A special MedGuide will be given to you by the pharmacist with each prescription and refill. Be sure to read this information carefully each  time. Talk to your care team about the use of this medication in children. Special care may be needed. Overdosage: If you think you have taken too much of this medicine contact a poison control center or emergency room at once. NOTE: This medicine is only for you. Do not share this medicine with others. What if I miss a dose? If you miss a dose, skip it. Take your next dose at the normal time. Do not take extra or 2 doses at the same time to make up for the missed dose. What may interact with this medication? What may interact with this medication? Aminophylline Carbamazepine Cyclosporine Digoxin Levothyroxine Other medications for diabetes Phenytoin Tacrolimus Theophylline Warfarin Many medications may cause changes in blood sugar, these include: Alcohol containing beverages Antiviral medications for HIV or AIDS Aspirin and aspirin-like medications Certain medications for blood pressure, heart disease, irregular heart beat Chromium Diuretics Male hormones, such as estrogens or progestins, birth control pills Fenofibrate Gemfibrozil Isoniazid Lanreotide Male hormones or anabolic steroids MAOIs like Carbex, Eldepryl, Marplan, Nardil, and Parnate Medications for weight loss Medications for allergies, asthma, cold, or cough Medications for depression, anxiety, or psychotic disturbances Niacin Nicotine NSAIDs, medications for pain and inflammation, like ibuprofen or naproxen Octreotide Pasireotide Pentamidine Phenytoin Probenecid Quinolone antibiotics such as ciprofloxacin, levofloxacin, ofloxacin Some herbal dietary supplements Steroid medications such as prednisone or cortisone Sulfamethoxazole; trimethoprim Thyroid hormones Some medications can hide the warning symptoms of low blood sugar (hypoglycemia). You may need to monitor your blood sugar more closely if you are taking one of these medications. These  include: Beta-blockers, often used for high blood pressure or  heart problems (examples include atenolol, metoprolol, propranolol) Clonidine Guanethidine Reserpine This list may not describe all possible interactions. Give your health care provider a list of all the medicines, herbs, non-prescription drugs, or dietary supplements you use. Also tell them if you smoke, drink alcohol, or use illegal drugs. Some items may interact with your medicine. What should I watch for while using this medication? Visit your care team for regular checks on your progress. Check with your care team if you have severe diarrhea, nausea, and vomiting, or if you sweat a lot. The loss of too much body fluid may make it dangerous for you to take this medication. A test called the HbA1C (A1C) will be monitored. This is a simple blood test. It measures your blood sugar control over the last 2 to 3 months. You will receive this test every 3 to 6 months. Learn how to check your blood sugar. Learn the symptoms of low and high blood sugar and how to manage them. Always carry a quick-source of sugar with you in case you have symptoms of low blood sugar. Examples include hard sugar candy or glucose tablets. Make sure others know that you can choke if you eat or drink when you develop serious symptoms of low blood sugar, such as seizures or unconsciousness. Get medical help at once. Tell your care team if you have high blood sugar. You might need to change the dose of your medication. If you are sick or exercising more than usual, you might need to change the dose of your medication. Do not skip meals. Ask your care team if you should avoid alcohol. Many nonprescription cough and cold products contain sugar or alcohol. These can affect blood sugar. Wear a medical ID bracelet or chain. Carry a card that describes your condition. List the medications and doses you take on the card. Do not become pregnant while taking this medication. Women should inform their care team if they wish to become  pregnant or think they might be pregnant. There is a potential for serious side effects to an unborn child. Talk to your care team for more information. Do not breast-feed an infant while taking this medication. What side effects may I notice from receiving this medication? Side effects that you should report to your care team as soon as possible: Allergic reactions--skin rash, itching, hives, swelling of the face, lips, tongue, or throat Change in vision Dehydration--increased thirst, dry mouth, feeling faint or lightheaded, headache, dark yellow or brown urine Gallbladder problems--severe stomach pain, nausea, vomiting, fever Heart palpitations--rapid, pounding, or irregular heartbeat Kidney injury--decrease in the amount of urine, swelling of the ankles, hands, or feet Pancreatitis--severe stomach pain that spreads to your back or gets worse after eating or when touched, fever, nausea, vomiting Thyroid cancer--new mass or lump in the neck, pain or trouble swallowing, trouble breathing, hoarseness Side effects that usually do not require medical attention (report to your care team if they continue or are bothersome): Diarrhea Loss of appetite Nausea Stomach pain Vomiting This list may not describe all possible side effects. Call your doctor for medical advice about side effects. You may report side effects to FDA at 1-800-FDA-1088. Where should I keep my medication? Keep out of the reach of children and pets. Store at room temperature between 20 and 25 degrees C (68 and 77 degrees F). Keep this medication in the original container. Protect from moisture. Keep the container tightly closed. Get  rid of any unused medication after the expiration date. To get rid of medications that are no longer needed or have expired: Take the medication to a medication take-back program. Check with your pharmacy or law enforcement to find a location. If you cannot return the medication, check the label or  package insert to see if the medication should be thrown out in the garbage or flushed down the toilet. If you are not sure, ask your care team. If it is safe to put it in the trash, take the medication out of the container. Mix the medication with cat litter, dirt, coffee grounds, or other unwanted substance. Seal the mixture in a bag or container. Put it in the trash. NOTE: This sheet is a summary. It may not cover all possible information. If you have questions about this medicine, talk to your doctor, pharmacist, or health care provider.  2022 Elsevier/Gold Standard (2020-06-08 00:00:00)

## 2021-04-07 NOTE — Progress Notes (Signed)
Patient ID: Erik Allen, male    DOB: 05/27/1955, 66 y.o.   MRN: [id number]  This visit was conducted in person.  BP 140/66    Pulse 95    Temp 98.1 F (36.7 C) (Temporal)    Ht 5\' 10"  (1.778 m)    Wt 243 lb 6 oz (110.4 kg)    SpO2 96%    BMI 34.92 kg/m    CC: 4 mo DM f/u visit  Subjective:   HPI: Erik Allen is a 66 y.o. male presenting on 04/07/2021 for Diabetes (Here for 4 mo f/u. )   Severe AS s/p minimally invasive AVR with bioprosthetic valve 04/2019.   Hoarseness - saw ENT Dr Richardson Landry with concern for enlarging nodules on vocal cords, has f/u next week at [facility] to discuss possible intervention.   DM - does regularly check fasting sugars every morning 120-130s. Compliant with antihyperglycemic regimen which includes: metformin 500mg  bid. Declines additional medication at this time. Denies low sugars or hypoglycemic symptoms. Denies paresthesias, blurry vision. Last diabetic eye exam 09/2020. Glucometer brand: accuchek. Last foot exam: 05/2020. DSME: declined.  Lab Results  Component Value Date   HGBA1C 6.7 (A) 04/07/2021   Diabetic Foot Exam - Simple   Simple Foot Form Diabetic Foot exam was performed with the following findings: Yes 04/07/2021 10:49 AM  Visual Inspection See comments: Yes Sensation Testing Intact to touch and monofilament testing bilaterally: Yes Pulse Check Posterior Tibialis and Dorsalis pulse intact bilaterally: Yes Comments R great toenail onychomycosis affecting nail bed    Lab Results  Component Value Date   MICROALBUR 2.6 (H) 07/09/2017         Relevant past medical, surgical, family and social history reviewed and updated as indicated. Interim medical history since our last visit reviewed. Allergies and medications reviewed and updated. Outpatient Medications Prior to Visit  Medication Sig Dispense Refill   Accu-Chek FastClix Lancets MISC USE AS DIRECTED TO CHECK BLOOD SUGAR DAILY 102 each 3   albuterol (PROVENTIL) (2.5  MG/3ML) 0.083% nebulizer solution USE 1 VIAL VIA NEBULIZER EVERY 6 HOURS AS NEEDED 150 mL 3   albuterol (VENTOLIN HFA) 108 (90 Base) MCG/ACT inhaler Inhale 2 puffs into the lungs every 6 (six) hours as needed for wheezing or shortness of breath. 18 g 6   ascorbic acid (VITAMIN C) 500 MG tablet Take 1 tablet (500 mg total) by mouth daily. 30 tablet 0   aspirin 81 MG EC tablet Take 1 tablet (81 mg total) by mouth daily. 30 tablet 0   benzonatate (TESSALON) 100 MG capsule Take 200 mg by mouth every 8 (eight) hours as needed.     cholecalciferol (VITAMIN D3) 10 MCG (400 UNIT) TABS tablet Take 400 Units by mouth daily.     cyanocobalamin (V-R VITAMIN B-12) 500 MCG tablet Take 1 tablet (500 mcg total) by mouth daily.     finasteride (PROSCAR) 5 MG tablet Take 1 tablet (5 mg total) by mouth daily. 90 tablet 3   fluticasone-salmeterol (ADVAIR DISKUS) 250-50 MCG/ACT AEPB Inhale 1 puff into the lungs in the morning and at bedtime. 180 each 3   glucose blood (ACCU-CHEK GUIDE) test strip USE AS INSTRUCTED TO CHECK BLOOD SUGAR DAILY 100 strip 3   metFORMIN (GLUCOPHAGE) 500 MG tablet Take 1 tablet (500 mg total) by mouth 2 (two) times daily with a meal. 180 tablet 3   montelukast (SINGULAIR) 10 MG tablet Take 1 tablet (10 mg total) by mouth at bedtime.  90 tablet 3   pantoprazole (PROTONIX) 40 MG tablet Take 40 mg by mouth at bedtime.     rosuvastatin (CRESTOR) 40 MG tablet Take 1 tablet (40 mg total) by mouth daily. 90 tablet 3   valsartan-hydrochlorothiazide (DIOVAN-HCT) 160-25 MG tablet Take 1 tablet by mouth daily. 90 tablet 3   zinc sulfate 220 (50 Zn) MG capsule Take 1 capsule (220 mg total) by mouth daily. 30 capsule 0   No facility-administered medications prior to visit.     Per HPI unless specifically indicated in ROS section below Review of Systems  Objective:  BP 140/66    Pulse 95    Temp 98.1 F (36.7 C) (Temporal)    Ht 5\' 10"  (1.778 m)    Wt 243 lb 6 oz (110.4 kg)    SpO2 96%    BMI 34.92  kg/m   Wt Readings from Last 3 Encounters:  04/07/21 243 lb 6 oz (110.4 kg)  01/09/21 245 lb (111.1 kg)  12/06/20 253 lb (114.8 kg)      Physical Exam Vitals and nursing note reviewed.  Constitutional:      Appearance: Normal appearance. He is not ill-appearing.  Eyes:     Extraocular Movements: Extraocular movements intact.     Conjunctiva/sclera: Conjunctivae normal.     Pupils: Pupils are equal, round, and reactive to light.  Cardiovascular:     Rate and Rhythm: Normal rate and regular rhythm.     Pulses: Normal pulses.     Heart sounds: Murmur (2/6 prosthetic systolic murmur) heard.  Pulmonary:     Effort: Pulmonary effort is normal. No respiratory distress.     Breath sounds: Normal breath sounds. No wheezing, rhonchi or rales.  Musculoskeletal:     Right lower leg: No edema.     Left lower leg: No edema.     Comments: See HPI for foot exam if done  Skin:    General: Skin is warm and dry.     Findings: No rash.  Neurological:     Mental Status: He is alert.  Psychiatric:        Mood and Affect: Mood normal.        Behavior: Behavior normal.      Results for orders placed or performed in visit on 04/07/21  POCT glycosylated hemoglobin (Hb A1C)  Result Value Ref Range   Hemoglobin A1C 6.7 (A) 4.0 - 5.6 %   HbA1c POC (<> result, manual entry)     HbA1c, POC (prediabetic range)     HbA1c, POC (controlled diabetic range)      Assessment & Plan:  This visit occurred during the SARS-CoV-2 public health emergency.  Safety protocols were in place, including screening questions prior to the visit, additional usage of staff PPE, and extensive cleaning of exam room while observing appropriate contact time as indicated for disinfecting solutions.   Problem List Items Addressed This Visit     Controlled type 2 diabetes mellitus without complication, without long-term current use of insulin ([place]) - Primary    Chronic, stable on metformin 500mg  bid.  Discussed GLP1RA and its  benefits. Declines injectable medication.  Agrees to price out oral rybelsus.  Reviewed possible side effects.  No fmhx thyroid cancer.       Relevant Medications   Semaglutide (RYBELSUS) 3 MG TABS   Semaglutide (RYBELSUS) 7 MG TABS   Other Relevant Orders   POCT glycosylated hemoglobin (Hb A1C) (Completed)   Obesity, Class I, BMI 30.0-34.9 (see actual  BMI)    Weight loss noted.       Fatty liver disease, nonalcoholic    Trial oral [id number].       Hoarseness    S/p vocal cold polyp excision 06/2020.  Now recurrent hoarseness with return of polyps - has been referred to [facility].       S/P minimally invasive aortic valve replacement with bioprosthetic valve   Onychomycosis    To R great toenail.  Likely too extensive for topical antifungal, hesitant to use oral antifungal in transaminitis history.  Will update LFTs at CPE and decide on oral antifungal course at that time.         Meds ordered this encounter  Medications   Semaglutide (RYBELSUS) 3 MG TABS    Sig: Take 3 mg by mouth daily. For the first month    Dispense:  30 tablet    Refill:  0   Semaglutide (RYBELSUS) 7 MG TABS    Sig: Take 7 mg by mouth daily. For subesquent months    Dispense:  30 tablet    Refill:  6   Orders Placed This Encounter  Procedures   POCT glycosylated hemoglobin (Hb A1C)     Patient instructions: You are doing well today  Continue current medicines. Price out rybelsus 3mg  daily for the first month then 7mg  daily. Let us know if any difficulty tolerating medicine.  Return in 5 months for physical.   Follow up plan: Return in about 5 months (around 09/04/2021) for annual exam, prior fasting for blood work.  Ria Bush, MD

## 2021-04-07 NOTE — Assessment & Plan Note (Signed)
Weight loss noted  

## 2021-04-07 NOTE — Assessment & Plan Note (Addendum)
S/p vocal cold polyp excision 06/2020.  Now recurrent hoarseness with return of polyps - has been referred to [facility].

## 2021-04-24 ENCOUNTER — Telehealth: Payer: Self-pay

## 2021-04-24 NOTE — Telephone Encounter (Signed)
Filled and in Lisa's box 

## 2021-04-24 NOTE — Telephone Encounter (Signed)
Received faxed PA form from [facility] for Rybelsus 3 mg tab.  Placed form in Dr. Synthia Innocent box.  ?

## 2021-04-25 NOTE — Telephone Encounter (Signed)
Faxed PA.  Decision pending.  ?

## 2021-05-01 NOTE — Telephone Encounter (Signed)
PA approved, valid through 04/26/2022. ?

## 2021-08-16 ENCOUNTER — Other Ambulatory Visit: Payer: Self-pay | Admitting: Family Medicine

## 2021-08-25 ENCOUNTER — Other Ambulatory Visit: Payer: Self-pay | Admitting: Family Medicine

## 2021-08-25 DIAGNOSIS — E119 Type 2 diabetes mellitus without complications: Secondary | ICD-10-CM

## 2021-08-27 ENCOUNTER — Other Ambulatory Visit: Payer: Self-pay | Admitting: Family Medicine

## 2021-08-27 DIAGNOSIS — E119 Type 2 diabetes mellitus without complications: Secondary | ICD-10-CM

## 2021-08-27 DIAGNOSIS — E1169 Type 2 diabetes mellitus with other specified complication: Secondary | ICD-10-CM

## 2021-08-27 DIAGNOSIS — E559 Vitamin D deficiency, unspecified: Secondary | ICD-10-CM

## 2021-08-27 DIAGNOSIS — N400 Enlarged prostate without lower urinary tract symptoms: Secondary | ICD-10-CM

## 2021-08-29 ENCOUNTER — Other Ambulatory Visit (INDEPENDENT_AMBULATORY_CARE_PROVIDER_SITE_OTHER): Payer: 59

## 2021-08-29 DIAGNOSIS — E559 Vitamin D deficiency, unspecified: Secondary | ICD-10-CM | POA: Diagnosis not present

## 2021-08-29 DIAGNOSIS — E1169 Type 2 diabetes mellitus with other specified complication: Secondary | ICD-10-CM | POA: Diagnosis not present

## 2021-08-29 DIAGNOSIS — E119 Type 2 diabetes mellitus without complications: Secondary | ICD-10-CM | POA: Diagnosis not present

## 2021-08-29 DIAGNOSIS — E785 Hyperlipidemia, unspecified: Secondary | ICD-10-CM | POA: Diagnosis not present

## 2021-08-29 DIAGNOSIS — N400 Enlarged prostate without lower urinary tract symptoms: Secondary | ICD-10-CM | POA: Diagnosis not present

## 2021-08-29 LAB — COMPREHENSIVE METABOLIC PANEL
ALT: 68 U/L — ABNORMAL HIGH (ref 0–53)
AST: 51 U/L — ABNORMAL HIGH (ref 0–37)
Albumin: 4.4 g/dL (ref 3.5–5.2)
Alkaline Phosphatase: 52 U/L (ref 39–117)
BUN: 19 mg/dL (ref 6–23)
CO2: 29 mEq/L (ref 19–32)
Calcium: 9.5 mg/dL (ref 8.4–10.5)
Chloride: 101 mEq/L (ref 96–112)
Creatinine, Ser: 0.96 mg/dL (ref 0.40–1.50)
GFR: 83.83 mL/min (ref >60.00)
Glucose, Bld: 124 mg/dL — ABNORMAL HIGH (ref 70–99)
Potassium: 4.2 mEq/L (ref 3.5–5.1)
Sodium: 139 mEq/L (ref 135–145)
Total Bilirubin: 0.5 mg/dL (ref 0.2–1.2)
Total Protein: 6.8 g/dL (ref 6.0–8.3)

## 2021-08-29 LAB — MICROALBUMIN / CREATININE URINE RATIO
Creatinine,U: 335.7 mg/dL
Microalb Creat Ratio: 0.9 mg/g (ref 0.0–30.0)
Microalb, Ur: 3.1 mg/dL — ABNORMAL HIGH (ref 0.0–1.9)

## 2021-08-29 LAB — LIPID PANEL
Cholesterol: 150 mg/dL (ref 0–200)
HDL: 51.8 mg/dL (ref >39.00)
NonHDL: 97.72
Total CHOL/HDL Ratio: 3
Triglycerides: 218 mg/dL — ABNORMAL HIGH (ref 0.0–149.0)
VLDL: 43.6 mg/dL — ABNORMAL HIGH (ref 0.0–40.0)

## 2021-08-29 LAB — PSA: PSA: 0.11 ng/mL (ref 0.10–4.00)

## 2021-08-29 LAB — HEMOGLOBIN A1C: Hgb A1c MFr Bld: 7.1 % — ABNORMAL HIGH (ref 4.6–6.5)

## 2021-08-29 LAB — LDL CHOLESTEROL, DIRECT: Direct LDL: 75 mg/dL

## 2021-08-29 LAB — VITAMIN D 25 HYDROXY (VIT D DEFICIENCY, FRACTURES): VITD: 57.28 ng/mL (ref 30.00–100.00)

## 2021-09-05 ENCOUNTER — Ambulatory Visit (INDEPENDENT_AMBULATORY_CARE_PROVIDER_SITE_OTHER): Payer: 59 | Admitting: Family Medicine

## 2021-09-05 ENCOUNTER — Encounter: Payer: Self-pay | Admitting: Family Medicine

## 2021-09-05 VITALS — BP 124/66 | HR 86 | Temp 97.9°F | Ht 67.5 in | Wt 230.2 lb

## 2021-09-05 DIAGNOSIS — R490 Dysphonia: Secondary | ICD-10-CM

## 2021-09-05 DIAGNOSIS — K219 Gastro-esophageal reflux disease without esophagitis: Secondary | ICD-10-CM

## 2021-09-05 DIAGNOSIS — I10 Essential (primary) hypertension: Secondary | ICD-10-CM

## 2021-09-05 DIAGNOSIS — Z1211 Encounter for screening for malignant neoplasm of colon: Secondary | ICD-10-CM | POA: Diagnosis not present

## 2021-09-05 DIAGNOSIS — K760 Fatty (change of) liver, not elsewhere classified: Secondary | ICD-10-CM

## 2021-09-05 DIAGNOSIS — E559 Vitamin D deficiency, unspecified: Secondary | ICD-10-CM

## 2021-09-05 DIAGNOSIS — E1169 Type 2 diabetes mellitus with other specified complication: Secondary | ICD-10-CM

## 2021-09-05 DIAGNOSIS — Z0000 Encounter for general adult medical examination without abnormal findings: Secondary | ICD-10-CM

## 2021-09-05 DIAGNOSIS — Z953 Presence of xenogenic heart valve: Secondary | ICD-10-CM

## 2021-09-05 DIAGNOSIS — Z7189 Other specified counseling: Secondary | ICD-10-CM

## 2021-09-05 DIAGNOSIS — E785 Hyperlipidemia, unspecified: Secondary | ICD-10-CM

## 2021-09-05 DIAGNOSIS — E119 Type 2 diabetes mellitus without complications: Secondary | ICD-10-CM

## 2021-09-05 DIAGNOSIS — M8580 Other specified disorders of bone density and structure, unspecified site: Secondary | ICD-10-CM

## 2021-09-05 DIAGNOSIS — N400 Enlarged prostate without lower urinary tract symptoms: Secondary | ICD-10-CM

## 2021-09-05 DIAGNOSIS — J4520 Mild intermittent asthma, uncomplicated: Secondary | ICD-10-CM

## 2021-09-05 DIAGNOSIS — E8881 Metabolic syndrome: Secondary | ICD-10-CM

## 2021-09-05 DIAGNOSIS — E669 Obesity, unspecified: Secondary | ICD-10-CM

## 2021-09-05 MED ORDER — ROSUVASTATIN CALCIUM 40 MG PO TABS
40.0000 mg | ORAL_TABLET | Freq: Every day | ORAL | 3 refills | Status: DC
Start: 2021-09-05 — End: 2022-09-12

## 2021-09-05 MED ORDER — VALSARTAN-HYDROCHLOROTHIAZIDE 160-25 MG PO TABS: 1.0000 | ORAL_TABLET | Freq: Every day | ORAL | 3 refills | Status: DC

## 2021-09-05 MED ORDER — FLUTICASONE-SALMETEROL 250-50 MCG/ACT IN AEPB
1.0000 | INHALATION_SPRAY | Freq: Two times a day (BID) | RESPIRATORY_TRACT | 3 refills | Status: DC
Start: 2021-09-05 — End: 2022-07-02

## 2021-09-05 MED ORDER — METFORMIN HCL 500 MG PO TABS: 500.0000 mg | ORAL_TABLET | Freq: Two times a day (BID) | ORAL | 3 refills | Status: DC

## 2021-09-05 MED ORDER — RYBELSUS 7 MG PO TABS
7.0000 mg | ORAL_TABLET | Freq: Every day | ORAL | 3 refills | Status: DC
Start: 2021-09-05 — End: 2022-09-07

## 2021-09-05 MED ORDER — MONTELUKAST SODIUM 10 MG PO TABS: 10.0000 mg | ORAL_TABLET | Freq: Every day | ORAL | 3 refills | Status: DC

## 2021-09-05 MED ORDER — FINASTERIDE 5 MG PO TABS
5.0000 mg | ORAL_TABLET | Freq: Every day | ORAL | 3 refills | Status: DC
Start: 2021-09-05 — End: 2022-07-24

## 2021-09-05 NOTE — Patient Instructions (Addendum)
Pass by lab to pick up stool kit  Congratulations on weight loss! Keep up the good work! Consider shingrix vaccine 2 shot series.  We will order liver ultrasound for further evaluation of fatty liver.  Good to see you today Return as needed or in 6 months for diabetes follow up visit.   Health Maintenance, Male Adopting a healthy lifestyle and getting preventive care are important in promoting health and wellness. Ask your health care provider about: The right schedule for you to have regular tests and exams. Things you can do on your own to prevent diseases and keep yourself healthy. What should I know about diet, weight, and exercise? Eat a healthy diet  Eat a diet that includes plenty of vegetables, fruits, low-fat dairy products, and lean protein. Do not eat a lot of foods that are high in solid fats, added sugars, or sodium. Maintain a healthy weight Body mass index (BMI) is a measurement that can be used to identify possible weight problems. It estimates body fat based on height and weight. Your health care provider can help determine your BMI and help you achieve or maintain a healthy weight. Get regular exercise Get regular exercise. This is one of the most important things you can do for your health. Most adults should: Exercise for at least 150 minutes each week. The exercise should increase your heart rate and make you sweat (moderate-intensity exercise). Do strengthening exercises at least twice a week. This is in addition to the moderate-intensity exercise. Spend less time sitting. Even light physical activity can be beneficial. Watch cholesterol and blood lipids Have your blood tested for lipids and cholesterol at 64 years of age, then have this test every 5 years. You may need to have your cholesterol levels checked more often if: Your lipid or cholesterol levels are high. You are older than 64 years of age. You are at high risk for heart disease. What should I know about  cancer screening? Many types of cancers can be detected early and may often be prevented. Depending on your health history and family history, you may need to have cancer screening at various ages. This may include screening for: Colorectal cancer. Prostate cancer. Skin cancer. Lung cancer. What should I know about heart disease, diabetes, and high blood pressure? Blood pressure and heart disease High blood pressure causes heart disease and increases the risk of stroke. This is more likely to develop in people who have high blood pressure readings or are overweight. Talk with your health care provider about your target blood pressure readings. Have your blood pressure checked: Every 3-5 years if you are 75-1 years of age. Every year if you are 6 years old or older. If you are between the ages of 49 and 37 and are a current or former smoker, ask your health care provider if you should have a one-time screening for abdominal aortic aneurysm (AAA). Diabetes Have regular diabetes screenings. This checks your fasting blood sugar level. Have the screening done: Once every three years after age 57 if you are at a normal weight and have a low risk for diabetes. More often and at a younger age if you are overweight or have a high risk for diabetes. What should I know about preventing infection? Hepatitis B If you have a higher risk for hepatitis B, you should be screened for this virus. Talk with your health care provider to find out if you are at risk for hepatitis B infection. Hepatitis C Blood testing  is recommended for: Everyone born from [birth year] through 1963. Anyone with known risk factors for hepatitis C. Sexually transmitted infections (STIs) You should be screened each year for STIs, including gonorrhea and chlamydia, if: You are sexually active and are younger than 64 years of age. You are older than 64 years of age and your health care provider tells you that you are at risk for this type  of infection. Your sexual activity has changed since you were last screened, and you are at increased risk for chlamydia or gonorrhea. Ask your health care provider if you are at risk. Ask your health care provider about whether you are at high risk for HIV. Your health care provider may recommend a prescription medicine to help prevent HIV infection. If you choose to take medicine to prevent HIV, you should first get tested for HIV. You should then be tested every 3 months for as long as you are taking the medicine. Follow these instructions at home: Alcohol use Do not drink alcohol if your health care provider tells you not to drink. If you drink alcohol: Limit how much you have to 0-2 drinks a day. Know how much alcohol is in your drink. In the [place] one drink equals one 12 oz bottle of beer (355 mL), one 5 oz glass of wine (148 mL), or one 1 oz glass of hard liquor (44 mL). Lifestyle Do not use any products that contain nicotine or tobacco. These products include cigarettes, chewing tobacco, and vaping devices, such as e-cigarettes. If you need help quitting, ask your health care provider. Do not use street drugs. Do not share needles. Ask your health care provider for help if you need support or information about quitting drugs. General instructions Schedule regular health, dental, and eye exams. Stay current with your vaccines. Tell your health care provider if: You often feel depressed. You have ever been abused or do not feel safe at home. Summary Adopting a healthy lifestyle and getting preventive care are important in promoting health and wellness. Follow your health care provider's instructions about healthy diet, exercising, and getting tested or screened for diseases. Follow your health care provider's instructions on monitoring your cholesterol and blood pressure. This information is not intended to replace advice given to you by your health care provider. Make sure you discuss  any questions you have with your health care provider. Document Revised: 06/20/2020 Document Reviewed: 06/20/2020 Elsevier Patient Education  [place].

## 2021-09-05 NOTE — Assessment & Plan Note (Signed)
Chronic, stable on current regimen - continue. 

## 2021-09-05 NOTE — Assessment & Plan Note (Signed)
Preventative protocols reviewed and updated unless pt declined. Discussed healthy diet and lifestyle.  

## 2021-09-05 NOTE — Assessment & Plan Note (Signed)
Continues daily PPI through ENT>

## 2021-09-05 NOTE — Assessment & Plan Note (Signed)
Congratulated on weight loss to date. Motivated to continue efforts, rybelsus.

## 2021-09-05 NOTE — Assessment & Plan Note (Signed)
Stable period on daily advair and singulair with PRN albuterol.

## 2021-09-05 NOTE — Assessment & Plan Note (Signed)
Appreciate ENT care.

## 2021-09-05 NOTE — Assessment & Plan Note (Signed)
Chronic, A1c trending up despite rybelsus and weight loss. Reassess at 6 mo f/u visit.

## 2021-09-05 NOTE — Progress Notes (Addendum)
Patient ID: Erik Allen, male    DOB: [birth year], 66 y.o.   MRN: [id number]  This visit was conducted in person.  BP 124/66   Pulse 86   Temp 97.9 F (36.6 C) (Temporal)   Ht 5' 7.5" (1.715 m)   Wt 230 lb 4 oz (104.4 kg)   SpO2 97%   BMI 35.53 kg/m    CC: CPE Subjective:   HPI: Erik Allen is a 66 y.o. male presenting on 09/05/2021 for Annual Exam   Fully retired 02/11/2020.    Severe AS s/p minimally invasive AVR with bioprosthetic valve 04/2019. Heart catheterization prior to surgery showed normal coronary arteries. Does need SBE ppx for future dental procedures.    Known asthmatic on singulair, advair 1 puff BID, PRN albuterol.   Known controlled diabetic on metformin and Rybelsus 62m daily - fasting sugars 110-130s. 15 lb weight loss! Healthy diet changes in interim. No epigastric pain or nausea. Mild constipation managing with increased water intake and fiber.   [facility]-COVID infection, treated with colchicine x2 mo.    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    Preventative: Colonoscopy - 2010 WNL, rpt due 10 yrs. Would like to defer colonoscopy. Did not complete iFOB last year.  Prostate - yearly screen, normal. Good stream, nocturia x1. Known BPH on proscar.  Lung cancer screen - not eligible  Flu shot yearly  COVID vaccine - discussed, declines  Pneumovax 2014, [id number] Tdap 2016 shingrix - discussed - declines, has this at home. HCPOA - unsure, considering. Asked to [place].  Advanced directive - discussed, has this at home. Asked to bring [place] copy.  Seat belt use discussed  Sunscreen use discussed, no changing moles on skin. Sees [facility] derm. Uses UV protection shirts.  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 Interim medical history since our last visit reviewed. Allergies and medications reviewed and updated. Outpatient Medications Prior to Visit  Medication Sig Dispense Refill   Accu-Chek FastClix Lancets MISC USE AS DIRECTED TO CHECK BLOOD SUGAR DAILY 102 each 3   ACCU-CHEK GUIDE test strip USE AS INSTRUCTED TO CHECK BLOOD SUGAR DAILY 100 strip 3   albuterol (PROVENTIL) (2.5 MG/3ML) 0.083% nebulizer solution USE 1 VIAL VIA NEBULIZER EVERY 6 HOURS AS NEEDED 150 mL 3   albuterol (VENTOLIN HFA) 108 (90 Base) MCG/ACT inhaler Inhale 2 puffs into the lungs every 6 (six) hours as needed for wheezing or shortness of breath. 18 g 6   ascorbic acid (VITAMIN C) 500 MG tablet Take 1 tablet (500 mg total) by mouth daily. 30 tablet 0   aspirin 81 MG EC tablet Take 1 tablet (81 mg total) by mouth daily. 30 tablet 0   cholecalciferol (VITAMIN D3) 10 MCG (400 UNIT) TABS tablet Take 400 Units by mouth daily.     cyanocobalamin (V-R VITAMIN B-12) 500 MCG tablet Take 1 tablet (500 mcg total) by mouth  daily.     pantoprazole (PROTONIX) 40 MG tablet Take 40 mg by mouth at bedtime.     zinc sulfate 220 (50 Zn) MG capsule Take 1 capsule (220 mg total) by mouth daily. 30 capsule 0   finasteride (PROSCAR) 5 MG tablet Take 1 tablet (5 mg total) by mouth daily. 90 tablet 3   fluticasone-salmeterol (ADVAIR DISKUS) 250-50 MCG/ACT AEPB Inhale 1 puff into the lungs in the morning and at bedtime. 180 each 3   metFORMIN (GLUCOPHAGE) 500 MG tablet TAKE 1 TABLET BY MOUTH 2 TIMES DAILY WITH A MEAL. 180 tablet 0   montelukast (SINGULAIR) 10 MG tablet Take 1 tablet (10 mg total) by mouth  at bedtime. 90 tablet 3   rosuvastatin (CRESTOR) 40 MG tablet TAKE 1 TABLET BY MOUTH EVERY DAY 90 tablet 0   Semaglutide (RYBELSUS) 7 MG TABS Take 7 mg by mouth daily. For subesquent months 30 tablet 6   valsartan-hydrochlorothiazide (DIOVAN-HCT) 160-25 MG tablet Take 1 tablet by mouth daily. 90 tablet 3   benzonatate (TESSALON) 100 MG capsule Take 200 mg by mouth every 8 (eight) hours as needed.     Semaglutide (RYBELSUS) 3 MG TABS Take 3 mg by mouth daily. For the first month 30 tablet 0   No facility-administered medications prior to visit.     Per HPI unless specifically indicated in ROS section below Review of Systems  Constitutional:  Negative for activity change, appetite change, chills, fatigue, fever and unexpected weight change.  HENT:  Negative for hearing loss.   Eyes:  Negative for visual disturbance.  Respiratory:  Positive for cough. Negative for chest tightness, shortness of breath and wheezing.   Cardiovascular:  Negative for chest pain, palpitations and leg swelling.  Gastrointestinal:  Negative for abdominal distention, abdominal pain, blood in stool, constipation, diarrhea, nausea and vomiting.  Genitourinary:  Negative for difficulty urinating and hematuria.  Musculoskeletal:  Negative for arthralgias, myalgias and neck pain.  Skin:  Negative for rash.  Neurological:  Negative for dizziness, seizures, syncope and headaches.  Hematological:  Negative for adenopathy. Does not bruise/bleed easily.  Psychiatric/Behavioral:  Negative for dysphoric mood. The patient is not nervous/anxious.     Objective:  BP 124/66   Pulse 86   Temp 97.9 F (36.6 C) (Temporal)   Ht 5' 7.5" (1.715 m)   Wt 230 lb 4 oz (104.4 kg)   SpO2 97%   BMI 35.53 kg/m   Wt Readings from Last 3 Encounters:  09/05/21 230 lb 4 oz (104.4 kg)  04/07/21 243 lb 6 oz (110.4 kg)  01/09/21 245 lb (111.1 kg)      Physical Exam Vitals and nursing note reviewed.  Constitutional:      General: He is  not in acute distress.    Appearance: Normal appearance. He is well-developed. He is obese. He is not ill-appearing.  HENT:     Head: Normocephalic and atraumatic.     Right Ear: Hearing, tympanic membrane, ear canal and external ear normal.     Left Ear: Hearing, tympanic membrane, ear canal and external ear normal.  Eyes:     General: No scleral icterus.    Extraocular Movements: Extraocular movements intact.     Conjunctiva/sclera: Conjunctivae normal.     Pupils: Pupils are equal, round, and reactive to light.  Neck:     Thyroid: No thyroid mass or thyromegaly.     Vascular: No carotid bruit.  Cardiovascular:     Rate and Rhythm: Normal rate and  regular rhythm.     Pulses: Normal pulses.          Radial pulses are 2+ on the right side and 2+ on the left side.     Heart sounds: Murmur (2/6 systolic USB) heard.  Pulmonary:     Effort: Pulmonary effort is normal. No respiratory distress.     Breath sounds: Normal breath sounds. No wheezing, rhonchi or rales.  Abdominal:     General: Bowel sounds are normal. There is no distension.     Palpations: Abdomen is soft. There is no mass.     Tenderness: There is no abdominal tenderness. There is no guarding or rebound.     Hernia: No hernia is present.  Musculoskeletal:        General: Normal range of motion.     Cervical back: Normal range of motion and neck supple.     Right lower leg: No edema.     Left lower leg: No edema.  Lymphadenopathy:     Cervical: No cervical adenopathy.  Skin:    General: Skin is warm and dry.     Findings: No rash.  Neurological:     General: No focal deficit present.     Mental Status: He is alert and oriented to person, place, and time.  Psychiatric:        Mood and Affect: Mood normal.        Behavior: Behavior normal.        Thought Content: Thought content normal.        Judgment: Judgment normal.       Results for orders placed or performed in visit on 08/29/21  PSA  Result Value Ref  Range   PSA 0.11 0.10 - 4.00 ng/mL  Microalbumin / creatinine urine ratio  Result Value Ref Range   Microalb, Ur 3.1 (H) 0.0 - 1.9 mg/dL   Creatinine,U 335.7 mg/dL   Microalb Creat Ratio 0.9 0.0 - 30.0 mg/g  Hemoglobin A1c  Result Value Ref Range   Hgb A1c MFr Bld 7.1 (H) 4.6 - 6.5 %  VITAMIN D 25 Hydroxy (Vit-D Deficiency, Fractures)  Result Value Ref Range   VITD 57.28 30.00 - 100.00 ng/mL  Comprehensive metabolic panel  Result Value Ref Range   Sodium 139 135 - 145 mEq/L   Potassium 4.2 3.5 - 5.1 mEq/L   Chloride 101 96 - 112 mEq/L   CO2 29 19 - 32 mEq/L   Glucose, Bld 124 (H) 70 - 99 mg/dL   BUN 19 6 - 23 mg/dL   Creatinine, Ser 0.96 0.40 - 1.50 mg/dL   Total Bilirubin 0.5 0.2 - 1.2 mg/dL   Alkaline Phosphatase 52 39 - 117 U/L   AST 51 (H) 0 - 37 U/L   ALT 68 (H) 0 - 53 U/L   Total Protein 6.8 6.0 - 8.3 g/dL   Albumin 4.4 3.5 - 5.2 g/dL   GFR 83.83 >60.00 mL/min   Calcium 9.5 8.4 - 10.5 mg/dL  Lipid panel  Result Value Ref Range   Cholesterol 150 0 - 200 mg/dL   Triglycerides 218.0 (H) 0.0 - 149.0 mg/dL   HDL 51.80 >39.00 mg/dL   VLDL 43.6 (H) 0.0 - 40.0 mg/dL   Total CHOL/HDL Ratio 3    NonHDL 97.72   LDL cholesterol, direct  Result Value Ref Range   Direct LDL 75.0 mg/dL    Assessment & Plan:   Problem List Items Addressed This Visit     Healthcare maintenance -  Primary (Chronic)    Preventative protocols reviewed and updated unless pt declined. Discussed healthy diet and lifestyle.       Advanced directives, counseling/discussion (Chronic)    Advanced directive - discussed, has this at home. Asked to bring [place] a copy.       Vitamin D deficiency    Chronic, stable on low dose vit D.       Hyperlipidemia associated with type 2 diabetes mellitus (HCC)    Chronic, stable on current regimen - continue crestor 82m daily. The 10-year ASCVD risk score (Arnett DK, et al., 2019) is: 19%   Values used to calculate the score:     Age: [age]years     Sex:  Male     Is Non-Hispanic African American: No     Diabetic: Yes     Tobacco smoker: No     Systolic Blood Pressure: 1833mmHg     Is BP treated: Yes     HDL Cholesterol: 51.8 mg/dL     Total Cholesterol: 150 mg/dL       Relevant Medications   metFORMIN (GLUCOPHAGE) 500 MG tablet   rosuvastatin (CRESTOR) 40 MG tablet   Semaglutide (RYBELSUS) 7 MG TABS   valsartan-hydrochlorothiazide (DIOVAN-HCT) 160-25 MG tablet   Essential hypertension    Chronic, stable on current regimen - continue.       Relevant Medications   rosuvastatin (CRESTOR) 40 MG tablet   valsartan-hydrochlorothiazide (DIOVAN-HCT) 160-25 MG tablet   Asthma    Stable period on daily advair and singulair with PRN albuterol.       Relevant Medications   fluticasone-salmeterol (ADVAIR DISKUS) 250-50 MCG/ACT AEPB   montelukast (SINGULAIR) 10 MG tablet   GERD    Continues daily PPI through ENT>       Benign prostatic hyperplasia    Continue proscar. Asxs.       Relevant Medications   finasteride (PROSCAR) 5 MG tablet   Controlled type 2 diabetes mellitus without complication, without long-term current use of insulin (HCC)    Chronic, A1c trending up despite rybelsus and weight loss. Reassess at 6 mo f/u visit.       Relevant Medications   metFORMIN (GLUCOPHAGE) 500 MG tablet   rosuvastatin (CRESTOR) 40 MG tablet   Semaglutide (RYBELSUS) 7 MG TABS   valsartan-hydrochlorothiazide (DIOVAN-HCT) 160-25 MG tablet   Obesity, Class I, BMI 30.0-34.9 (see actual BMI)    Congratulated on weight loss to date. Motivated to continue efforts, rybelsus.       Fatty liver disease, nonalcoholic    Reviewed with patient. Update RUQ [place] persistent mild trans[place].       Relevant Orders   [place] LIMITED RU[place] (LIVER/GB)   Hoarseness    Appreciate ENT care.       Osteopenia    Continue vitamin D.  Consider baseline DEXA.       Metabolic syndrome    Continue Rybelsus and metformin and weight loss efforts.        S/P minimally invasive aortic valve replacement with bioprosthetic valve   Other Visit Diagnoses     Special screening for malignant neoplasms, colon       Relevant Orders   Fecal occult blood, imunochemical        Meds ordered this encounter  Medications   finasteride (PROSCAR) 5 MG tablet    Sig: Take 1 tablet (5 mg total) by mouth daily.    Dispense:  90 tablet    Refill:  3  fluticasone-salmeterol (ADVAIR DISKUS) 250-50 MCG/ACT AEPB    Sig: Inhale 1 puff into the lungs in the morning and at bedtime.    Dispense:  180 each    Refill:  3   metFORMIN (GLUCOPHAGE) 500 MG tablet    Sig: Take 1 tablet (500 mg total) by mouth 2 (two) times daily with a meal.    Dispense:  180 tablet    Refill:  3   montelukast (SINGULAIR) 10 MG tablet    Sig: Take 1 tablet (10 mg total) by mouth at bedtime.    Dispense:  90 tablet    Refill:  3   rosuvastatin (CRESTOR) 40 MG tablet    Sig: Take 1 tablet (40 mg total) by mouth daily.    Dispense:  90 tablet    Refill:  3   Semaglutide (RYBELSUS) 7 MG TABS    Sig: Take 7 mg by mouth daily.    Dispense:  90 tablet    Refill:  3   valsartan-hydrochlorothiazide (DIOVAN-HCT) 160-25 MG tablet    Sig: Take 1 tablet by mouth daily.    Dispense:  90 tablet    Refill:  3   Orders Placed This Encounter  Procedures   Fecal occult blood, imunochemical    Standing Status:   Future    Standing Expiration Date:   09/06/2022   US ABDOMEN LIMITED RUQ (LIVER/GB)    Standing Status:   Future    Standing Expiration Date:   09/06/2022    Order Specific Question:   Reason for Exam (SYMPTOM  OR DIAGNOSIS REQUIRED)    Answer:   fatty liver f/u    Order Specific Question:   Preferred imaging location?    Answer:   [facility]    Patient instuctions: Pass by lab to pick up stool kit  Congratulations on weight loss! Keep up the good work! Consider shingrix vaccine 2 shot series.  We will order liver ultrasound for further evaluation of  fatty liver.  Good to see you today Return as needed or in 6 months for diabetes follow up visit.   Follow up plan: Return in about 6 months (around 03/08/2022) for follow up visit.  Ria Bush, MD

## 2021-09-05 NOTE — Assessment & Plan Note (Signed)
Continue proscar. Asxs.

## 2021-09-05 NOTE — Assessment & Plan Note (Signed)
Chronic, stable on current regimen - continue crestor '40mg'$  daily. The 10-year ASCVD risk score (Arnett DK, et al., 2019) is: 19%   Values used to calculate the score:     Age: 66 years     Sex: Male     Is Non-Hispanic African American: No     Diabetic: Yes     Tobacco smoker: No     Systolic Blood Pressure: 184 mmHg     Is BP treated: Yes     HDL Cholesterol: 51.8 mg/dL     Total Cholesterol: 150 mg/dL

## 2021-09-05 NOTE — Assessment & Plan Note (Signed)
Advanced directive - discussed, has this at home. Asked to bring [place] a copy.

## 2021-09-05 NOTE — Assessment & Plan Note (Signed)
Chronic, stable on low dose vit D.

## 2021-09-05 NOTE — Assessment & Plan Note (Addendum)
Reviewed with patient. Update RUQ [place] with persistent mild transaminitis.

## 2021-09-05 NOTE — Assessment & Plan Note (Signed)
Continue Rybelsus and metformin and weight loss efforts.

## 2021-09-05 NOTE — Assessment & Plan Note (Signed)
Continue vitamin D.  Consider baseline DEXA.

## 2021-09-07 LAB — FECAL OCCULT BLOOD, IMMUNOCHEMICAL: Fecal Occult Bld: NEGATIVE

## 2021-09-08 ENCOUNTER — Other Ambulatory Visit: Payer: Self-pay | Admitting: Family Medicine

## 2021-09-15 ENCOUNTER — Ambulatory Visit
Admission: RE | Admit: 2021-09-15 | Discharge: 2021-09-15 | Disposition: A | Discharge status: Home / Self Care | Payer: 59 | Source: Ambulatory Visit | Attending: Family Medicine | Admitting: Family Medicine

## 2021-09-15 DIAGNOSIS — K760 Fatty (change of) liver, not elsewhere classified: Secondary | ICD-10-CM | POA: Insufficient documentation

## 2021-09-18 ENCOUNTER — Encounter: Payer: Self-pay | Admitting: Family Medicine

## 2021-09-18 DIAGNOSIS — R932 Abnormal findings on diagnostic imaging of liver and biliary tract: Secondary | ICD-10-CM | POA: Insufficient documentation

## 2021-09-27 LAB — HM DIABETES EYE EXAM

## 2021-10-26 ENCOUNTER — Other Ambulatory Visit: Payer: Self-pay | Admitting: Family Medicine

## 2022-01-09 ENCOUNTER — Encounter: Payer: Self-pay | Admitting: Cardiology

## 2022-01-09 ENCOUNTER — Ambulatory Visit: Payer: 59 | Attending: Cardiology | Admitting: Cardiology

## 2022-01-09 VITALS — BP 130/70 | HR 101 | Ht 70.0 in | Wt 226.0 lb

## 2022-01-09 DIAGNOSIS — E7800 Pure hypercholesterolemia, unspecified: Secondary | ICD-10-CM | POA: Diagnosis not present

## 2022-01-09 DIAGNOSIS — I10 Essential (primary) hypertension: Secondary | ICD-10-CM | POA: Diagnosis not present

## 2022-01-09 DIAGNOSIS — Z953 Presence of xenogenic heart valve: Secondary | ICD-10-CM | POA: Diagnosis not present

## 2022-01-09 NOTE — Progress Notes (Signed)
Cardiology Office Note:    Date:  01/09/2022   ID:  Erik Allen, DOB 09/07/1955, MRN [id number]  PCP:  Ria Bush, MD  Cardiologist:  Kate Sable, MD  Electrophysiologist:  None   Referring MD: Ria Bush, MD   Chief Complaint  Patient presents with   Follow-up    12 month f/u, no new cardiac concerns      History of Present Illness:    Erik Allen is a 66 y.o. male with a hx of aortic stenosis s/p aortic valve replacement on 04/2019, hypertension, hyperlipidemia who presents for follow-up.   States doing well, takes medications as prescribed.  Takes Rybelsus which has been helping with weight loss.  Denies chest pain or shortness of breath.  No concerns at this time.   Historical notes Echo 08/2019 EF 60 to 65%, normal functioning prosthetic valve. He underwent a minimally invasive aortic valve replacement with a bioprosthetic valve on 04/29/2019.  Postoperative course was uneventful.    Past Medical History:  Diagnosis Date   Aortic stenosis 06/21/2014   Echo 01/2018: Mild LVH, normal sys fxn EF 55-60%, G1DD, moderate AS with mildly dilated LA. rec yearly echocardiogram   Asthma 09/13/99   BENIGN PROSTATIC HYPERTROPHY 05/14/2003   CAP (community acquired pneumonia) 01/31/2015   COVID-19 virus infection 06/2019   Diabetes mellitus without complication (HCC)    Disorder of vocal cord 2012   h/o thrush on vocal cords per patient   GERD (gastroesophageal reflux disease) 10/13/01   ENT eval for GERD and thrush 2011   History of kidney stones    HTN (hypertension)    Hyperlipemia 10/13/97   S/P minimally invasive aortic valve replacement with bioprosthetic valve 04/29/2019   Edwards Inspiris Resilia stented bovine pericardial tissue valve (size 23 mm)   Seasonal allergic rhinitis    worse in spring   Systolic murmur    Transaminitis 2014   presumed fatty liver without [place], normal iron and viral hep panel   Vertigo    none - over 1 year    Past  Surgical History:  Procedure Laterality Date   A1 pulley release  10/2010   stensosing tenosynovitis, R milddle, ring, little fingers   AORTIC VALVE REPLACEMENT N/A 04/29/2019   Procedure: MINIMALLY INVASIVE AORTIC VALVE REPLACEMENT (AVR) USING INSPIRIS VALVE SIZE 23MM;  Surgeon: Rexene Alberts, MD;  Location: [place];  Service: Open Heart Surgery;  Laterality: N/A;   COLONOSCOPY  2010   WNL, rpt due 10 yrs   CYSTECTOMY     from back   CYSTOSCOPY  09/2000   Stone retrieval   [place] N/A 07/12/2020   DIRECT LARYNGOSCOPY WITH EXCISION OF VOCAL CORD NODULE Richardson Landry, Eddie Dibbles, MD)   EAR CYST EXCISION Left 08/02/2016   Procedure: EXCISION MUCOID CYST LEFT MIDDLE FINGER WITH DISTAL INTERPHALANGEAL JOINT [place];  Surgeon: Daryll Brod, MD;  Location: [place];  Service: Orthopedics;  Laterality: Left;   FOOT CAPSULE RELEASE W/ PERCUTANEOUS HEEL CORD LENGTHENING, TIBIAL TENDON TRANSFER  05/2007   bilateral, Dr. Orlene Plum HERNIA REPAIR  06/28/2009   left, with mesh, Dr. Bary Castilla   RIGHT/LEFT HEART CATH AND CORONARY ANGIOGRAPHY Bilateral 04/06/2019   Procedure: RIGHT/LEFT HEART CATH AND CORONARY ANGIOGRAPHY;  Surgeon: Wellington Hampshire, MD;  Location: [facility];  Service: Cardiovascular;  Laterality: Bilateral;   ROTATION FLAP Left 08/02/2016   Procedure: ROTATION DORSAL FLAP;  Surgeon: Daryll Brod, MD;  Location: [place];  Service:  Orthopedics;  Laterality: Left;   TEE WITHOUT CARDIOVERSION N/A 04/29/2019   Procedure: TRANSESOPHAGEAL ECHOCARDIOGRAM (TEE);  Surgeon: Rexene Alberts, MD;  Location: [place];  Service: Open Heart Surgery;  Laterality: N/A;   US ECHOCARDIOGRAPHY  10/1999   TR, MR    Current Medications: Current Meds  Medication Sig   Accu-Chek FastClix Lancets MISC USE AS DIRECTED TO CHECK BLOOD SUGAR DAILY   ACCU-CHEK GUIDE test strip USE AS INSTRUCTED TO CHECK BLOOD SUGAR DAILY   albuterol (PROVENTIL) (2.5 MG/3ML)  0.083% nebulizer solution USE 1 VIAL VIA NEBULIZER EVERY 6 HOURS AS NEEDED   albuterol (VENTOLIN HFA) 108 (90 Base) MCG/ACT inhaler TAKE 2 PUFFS BY MOUTH EVERY 6 HOURS AS NEEDED FOR WHEEZE OR SHORTNESS OF BREATH   ascorbic acid (VITAMIN C) 500 MG tablet Take 1 tablet (500 mg total) by mouth daily.   aspirin 81 MG EC tablet Take 1 tablet (81 mg total) by mouth daily.   cholecalciferol (VITAMIN D3) 10 MCG (400 UNIT) TABS tablet Take 400 Units by mouth daily.   cyanocobalamin (V-R VITAMIN B-12) 500 MCG tablet Take 1 tablet (500 mcg total) by mouth daily.   finasteride (PROSCAR) 5 MG tablet Take 1 tablet (5 mg total) by mouth daily.   fluticasone-salmeterol (ADVAIR DISKUS) 250-50 MCG/ACT AEPB Inhale 1 puff into the lungs in the morning and at bedtime.   hydrocortisone 2.5 % ointment Apply topically.   metFORMIN (GLUCOPHAGE) 500 MG tablet Take 1 tablet (500 mg total) by mouth 2 (two) times daily with a meal.   montelukast (SINGULAIR) 10 MG tablet Take 1 tablet (10 mg total) by mouth at bedtime.   pantoprazole (PROTONIX) 40 MG tablet Take 40 mg by mouth at bedtime.   rosuvastatin (CRESTOR) 40 MG tablet Take 1 tablet (40 mg total) by mouth daily.   Semaglutide (RYBELSUS) 7 MG TABS Take 7 mg by mouth daily.   valsartan-hydrochlorothiazide (DIOVAN-HCT) 160-25 MG tablet Take 1 tablet by mouth daily.   zinc sulfate 220 (50 Zn) MG capsule Take 1 capsule (220 mg total) by mouth daily.     Allergies:   Ace inhibitors, Codeine, and Tramadol   Social History   Socioeconomic History   Marital status: Single    Spouse name: Not on file   Number of children: Not on file   Years of education: Not on file   Highest education level: Not on file  Occupational History   Occupation: TEAM LEADER    Employer: [employer]    Comment: Sheet metal fabrication  Tobacco Use   Smoking status: Never   Smokeless tobacco: Never  Vaping Use   Vaping Use: Never used  Substance and Sexual Activity   Alcohol use:  Yes    Alcohol/week: 1.0 - 2.0 standard drink of alcohol    Types: 1 - 2 Standard drinks or equivalent per week    Comment: occassionally   Drug use: No   Sexual activity: Yes  Other Topics Concern   Not on file  Social History Narrative   Caffeine: a couple mountain dews/day, occasional coffee   Lives alone, no pets   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  ROS:   Please see the history of present illness.     All other systems reviewed and are negative.  EKGs/Labs/Other Studies Reviewed:    The following studies were reviewed today:   EKG:  EKG is  ordered today.  The ekg ordered today demonstrates sinus tachycardia, heart rate 101, otherwise normal ECG  Recent Labs: 08/29/2021: ALT 68; BUN 19; Creatinine, Ser 0.96; Potassium 4.2; Sodium 139  Recent Lipid Panel    Component Value Date/Time   CHOL 150 08/29/2021 0755   TRIG 218.0 (H) 08/29/2021 0755   HDL 51.80 08/29/2021 0755   CHOLHDL 3 08/29/2021 0755   VLDL 43.6 (H) 08/29/2021 0755   LDLCALC 70 08/14/2019 0753   LDLDIRECT 75.0 08/29/2021 0755    Physical Exam:    VS:  BP 130/70 (BP Location: Left Arm, Patient Position: Sitting, Cuff Size: Normal)   Pulse (!) 101   Ht '5\' 10"'$  (1.778 m)   Wt 226 lb (102.5 kg)   SpO2 95%   BMI 32.43 kg/m     Wt Readings from Last 3 Encounters:  01/09/22 226 lb (102.5 kg)  09/05/21 230 lb 4 oz (104.4 kg)  04/07/21 243 lb 6 oz (110.4 kg)     GEN:  Well  nourished, well developed in no acute distress HEENT: Normal NECK: No JVD; No carotid bruits LYMPHATICS: No lymphadenopathy CARDIAC: RRR, 1/6 systolic murmur, rusb, no gallops RESPIRATORY:  Clear to auscultation without rales, wheezing or rhonchi  ABDOMEN: Soft, non-tender, non-distended MUSCULOSKELETAL:  No edema; No deformity  SKIN: Warm and dry NEUROLOGIC:  Alert and oriented x 3 PSYCHIATRIC:  Normal affect   ASSESSMENT:    1. S/P aortic valve replacement with bioprosthetic valve   2. Essential hypertension   3. Pure hypercholesterolemia     PLAN:    In order of problems listed above:  severe aortic stenosis s/p aortic valve replacement with a bioprosthetic valve on 04/2019, minimally invasive approach.  last  Echo 08/20/2019 showed normal systolic function, EF 60 to 65%, impaired relaxation, normal functioning aortic valve prosthesis.  Repeat echo, cont asa '81mg'$  qd. Hypertension, BP controlled.  Continue valsartan, HCTZ. History of hyperlipidemia, continue Crestor 40 mg daily.  Follow-up yearly or earlier if echo shows gross structural abnormalities.   Medication Adjustments/Labs and Tests Ordered: Current medicines are reviewed at length with the patient today.  Concerns regarding medicines are outlined above.  Orders Placed This Encounter  Procedures   EKG 12-Lead   ECHOCARDIOGRAM COMPLETE    No orders of the defined types were placed in this encounter.    Patient Instructions  Medication Instructions:   Your physician recommends that you continue on your current medications as directed. Please refer to the Current Medication list given to you today.  *If you need a refill on your cardiac medications before your next appointment, please call your pharmacy*    Testing/Procedures:  Your physician has requested that you have an echocardiogram. Echocardiography is a painless test that uses sound waves to create images of your heart. It provides your doctor with  information about the size and shape of your heart and how well your heart's chambers and valves are working. This procedure takes approximately one hour. There are no restrictions for this procedure. Please do NOT wear cologne, perfume,  aftershave, or lotions (deodorant is allowed). Please arrive 15 minutes prior to your appointment time.    Follow-Up: At [facility], you and your health needs are our priority.  As part of our continuing mission to provide you with exceptional heart care, we have created designated Provider Care Teams.  These Care Teams include your primary Cardiologist (physician) and Advanced Practice Providers (APPs -  Physician Assistants and Nurse Practitioners) who all work together to provide you with the care you need, when you need it.  We recommend signing up for the patient portal called "MyChart".  Sign up information is provided on this After Visit Summary.  MyChart is used to connect with patients for Virtual Visits (Telemedicine).  Patients are able to view lab/test results, encounter notes, upcoming appointments, etc.  Non-urgent messages can be sent to your provider as well.   To learn more about what you can do with MyChart, go to [email or web address].    Your next appointment:   1 year(s)  The format for your next appointment:   In Person  Provider:   Kate Sable, MD    Other Instructions   Important Information About Sugar         Signed, Kate Sable, MD  01/09/2022 9:19 AM    [place]

## 2022-01-09 NOTE — Patient Instructions (Signed)
Medication Instructions:   Your physician recommends that you continue on your current medications as directed. Please refer to the Current Medication list given to you today.  *If you need a refill on your cardiac medications before your next appointment, please call your pharmacy*    Testing/Procedures:  Your physician has requested that you have an echocardiogram. Echocardiography is a painless test that uses sound waves to create images of your heart. It provides your doctor with information about the size and shape of your heart and how well your heart's chambers and valves are working. This procedure takes approximately one hour. There are no restrictions for this procedure. Please do NOT wear cologne, perfume, aftershave, or lotions (deodorant is allowed). Please arrive 15 minutes prior to your appointment time.    Follow-Up: At [facility], you and your health needs are our priority.  As part of our continuing mission to provide you with exceptional heart care, we have created designated Provider Care Teams.  These Care Teams include your primary Cardiologist (physician) and Advanced Practice Providers (APPs -  Physician Assistants and Nurse Practitioners) who all work together to provide you with the care you need, when you need it.  We recommend signing up for the patient portal called "MyChart".  Sign up information is provided on this After Visit Summary.  MyChart is used to connect with patients for Virtual Visits (Telemedicine).  Patients are able to view lab/test results, encounter notes, upcoming appointments, etc.  Non-urgent messages can be sent to your provider as well.   To learn more about what you can do with MyChart, go to [email or web address].    Your next appointment:   1 year(s)  The format for your next appointment:   In Person  Provider:   Kate Sable, MD    Other Instructions   Important Information About Sugar

## 2022-01-18 ENCOUNTER — Other Ambulatory Visit: Payer: Self-pay | Admitting: Family Medicine

## 2022-03-02 ENCOUNTER — Ambulatory Visit: Payer: 59 | Attending: Cardiology

## 2022-03-02 ENCOUNTER — Other Ambulatory Visit: Payer: Self-pay | Admitting: Family Medicine

## 2022-03-02 DIAGNOSIS — Z953 Presence of xenogenic heart valve: Secondary | ICD-10-CM | POA: Diagnosis not present

## 2022-03-02 LAB — ECHOCARDIOGRAM COMPLETE
AR max vel: 2.54 cm2
AV Area VTI: 2.95 cm2
AV Area mean vel: 2.81 cm2
AV Mean grad: 14 mmHg
AV Peak grad: 27.8 mmHg
Ao pk vel: 2.64 m/s
Area-P 1/2: 2.6 cm2
S' Lateral: 2.25 cm

## 2022-03-02 MED ORDER — PERFLUTREN LIPID MICROSPHERE
1.0000 mL | INTRAVENOUS | Status: AC | PRN
  Administered 2022-03-02: 2 mL via INTRAVENOUS

## 2022-03-09 ENCOUNTER — Encounter: Payer: Self-pay | Admitting: Family Medicine

## 2022-03-09 ENCOUNTER — Ambulatory Visit (INDEPENDENT_AMBULATORY_CARE_PROVIDER_SITE_OTHER): Payer: 59 | Admitting: Family Medicine

## 2022-03-09 VITALS — BP 136/78 | HR 102 | Temp 97.3°F | Ht 70.0 in | Wt 226.0 lb

## 2022-03-09 DIAGNOSIS — J4520 Mild intermittent asthma, uncomplicated: Secondary | ICD-10-CM | POA: Diagnosis not present

## 2022-03-09 DIAGNOSIS — K760 Fatty (change of) liver, not elsewhere classified: Secondary | ICD-10-CM

## 2022-03-09 DIAGNOSIS — E1169 Type 2 diabetes mellitus with other specified complication: Secondary | ICD-10-CM | POA: Diagnosis not present

## 2022-03-09 DIAGNOSIS — Z23 Encounter for immunization: Secondary | ICD-10-CM

## 2022-03-09 DIAGNOSIS — R490 Dysphonia: Secondary | ICD-10-CM

## 2022-03-09 DIAGNOSIS — K219 Gastro-esophageal reflux disease without esophagitis: Secondary | ICD-10-CM

## 2022-03-09 DIAGNOSIS — R932 Abnormal findings on diagnostic imaging of liver and biliary tract: Secondary | ICD-10-CM

## 2022-03-09 DIAGNOSIS — E669 Obesity, unspecified: Secondary | ICD-10-CM

## 2022-03-09 LAB — POCT GLYCOSYLATED HEMOGLOBIN (HGB A1C): Hemoglobin A1C: 6.9 % — AB (ref 4.0–5.6)

## 2022-03-09 MED ORDER — PANTOPRAZOLE SODIUM 40 MG PO TBEC: 40.0000 mg | DELAYED_RELEASE_TABLET | Freq: Every day | ORAL | 2 refills | Status: DC

## 2022-03-09 MED ORDER — FLUTICASONE FUROATE-VILANTEROL 200-25 MCG/ACT IN AEPB: 1.0000 | INHALATION_SPRAY | Freq: Every day | RESPIRATORY_TRACT | 4 refills | Status: DC
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
## 2022-03-09 NOTE — Assessment & Plan Note (Signed)
Due for rpt abd [place] to further evaluate possible complex liver cyst - ordered.

## 2022-03-09 NOTE — Assessment & Plan Note (Signed)
Chronic, stable on current regimen of metformin and Rybelsus.  Patient desires to continue current regimen. Will request latest diabetic eye exam from fall of this past year. Foot exam today.

## 2022-03-09 NOTE — Patient Instructions (Addendum)
Flu shot today We will request records from [facility] doctor in [place] ([address]).  Due to insurance formulary change, may fill Breo Ellipta inhaler 1 puff daily in place of advair when you run out. Let us know how you do with this. Continue singulair nightly and albuterol inhaler as needed.  Sugars are doing well - continue metformin and rybelsus.  Return as needed or in 6 months for physical.

## 2022-03-09 NOTE — Assessment & Plan Note (Addendum)
Requests PPI refilled as he no longer sees ENT Bennettregularly

## 2022-03-09 NOTE — Assessment & Plan Note (Addendum)
Followed by [facility] ENT Dr Lamar Benes s/p vocal cord polyp excision 2022 and 2023

## 2022-03-09 NOTE — Assessment & Plan Note (Signed)
Update abd [place]

## 2022-03-09 NOTE — Assessment & Plan Note (Signed)
Encouraged incorporating regular exercise into routine.

## 2022-03-09 NOTE — Assessment & Plan Note (Signed)
Stable period on daily advair, singulair, PRN albuterol. Advair discus no longer covered by insurance - will print out Rx for Breo inhaler to try in place of advair due to formulary change.

## 2022-03-13 ENCOUNTER — Encounter: Payer: Self-pay | Admitting: Family Medicine

## 2022-03-15 ENCOUNTER — Ambulatory Visit
Admission: RE | Admit: 2022-03-15 | Discharge: 2022-03-15 | Disposition: A | Discharge status: Home / Self Care | Payer: 59 | Source: Ambulatory Visit | Attending: Family Medicine | Admitting: Family Medicine

## 2022-03-15 DIAGNOSIS — K760 Fatty (change of) liver, not elsewhere classified: Secondary | ICD-10-CM

## 2022-03-15 DIAGNOSIS — R932 Abnormal findings on diagnostic imaging of liver and biliary tract: Secondary | ICD-10-CM | POA: Diagnosis present

## 2022-03-23 ENCOUNTER — Other Ambulatory Visit: Payer: Self-pay | Admitting: Family Medicine

## 2022-03-23 DIAGNOSIS — J4520 Mild intermittent asthma, uncomplicated: Secondary | ICD-10-CM

## 2022-04-12 ENCOUNTER — Other Ambulatory Visit (HOSPITAL_COMMUNITY): Payer: Self-pay

## 2022-04-30 ENCOUNTER — Other Ambulatory Visit (HOSPITAL_COMMUNITY): Payer: Self-pay

## 2022-06-08 ENCOUNTER — Telehealth: Payer: Self-pay

## 2022-06-08 NOTE — Telephone Encounter (Signed)
PA initiated via Covermymeds; KEY: [id number]. Awaiting determination.

## 2022-06-08 NOTE — Telephone Encounter (Signed)
PA approved.   Request Reference Number: [id number]. RYBELSUS TAB 7MG  is approved through 06/08/2023. Your patient may now fill this prescription and it will be covered.. Authorization Expiration Date: June 08, 2023

## 2022-06-11 ENCOUNTER — Other Ambulatory Visit (HOSPITAL_COMMUNITY): Payer: Self-pay

## 2022-06-11 NOTE — Telephone Encounter (Signed)
Spoke with [facility] making them aware of PA approval. States they have to order it but should have it tomorrow.   Spoke with pt relaying info above. Pt verbalizes understanding.

## 2022-07-02 ENCOUNTER — Ambulatory Visit (INDEPENDENT_AMBULATORY_CARE_PROVIDER_SITE_OTHER)
Admission: RE | Admit: 2022-07-02 | Discharge: 2022-07-02 | Disposition: A | Discharge status: Home / Self Care | Payer: 59 | Source: Ambulatory Visit | Attending: Family Medicine | Admitting: Family Medicine

## 2022-07-02 ENCOUNTER — Encounter: Payer: Self-pay | Admitting: Family Medicine

## 2022-07-02 ENCOUNTER — Ambulatory Visit (INDEPENDENT_AMBULATORY_CARE_PROVIDER_SITE_OTHER): Payer: 59 | Admitting: Family Medicine

## 2022-07-02 VITALS — BP 124/72 | HR 93 | Temp 97.3°F | Ht 70.0 in | Wt 222.0 lb

## 2022-07-02 DIAGNOSIS — Z953 Presence of xenogenic heart valve: Secondary | ICD-10-CM

## 2022-07-02 DIAGNOSIS — K432 Incisional hernia without obstruction or gangrene: Secondary | ICD-10-CM | POA: Insufficient documentation

## 2022-07-02 DIAGNOSIS — R21 Rash and other nonspecific skin eruption: Secondary | ICD-10-CM

## 2022-07-02 NOTE — Progress Notes (Signed)
Ph: [phone] Fax: [phone]   Patient ID: Erik Allen, male    DOB: Dec 06, 1955, 67 y.o.   MRN: [id number]  This visit was conducted in person.  BP 124/72   Pulse 93   Temp (!) 97.3 F (36.3 C) (Temporal)   Ht 5\' 10"  (1.778 m)   Wt 222 lb (100.7 kg)   SpO2 97%   BMI 31.85 kg/m    CC: "I've got a hernia to right upper chest" right chest wall pain Subjective:   HPI: Erik Allen is a 67 y.o. male presenting on 07/02/2022 for Muscle Pain (C/o muscle pain in R chest at surgery site due to coughing. Pain radiates into R axilla with certain movements or with cough. )   H/o minimally invasive AV replacement using 23mm inspiris valve by Dr Cornelius Moras 04/2019.   About 1 month ago noticed bulge to R chest wall with cough, with associated pain at site and radiation to right axilla. Pain raising arm as well. Worse after recent cough managed with mucinex. Bulges with cough/straining. At its worse 8/10 pain, currently 5/10 with palpation.  No other chest pain.   Known asthma managed with breo, singulair, albuterol prn.   Also dealing with poison oak to back for several weeks, getting better. Treating with witch hazel with dish brush applicator with benefit. Has been out in yard recently.      Relevant past medical, surgical, family and social history reviewed and updated as indicated. Interim medical history since our last visit reviewed. Allergies and medications reviewed and updated. Outpatient Medications Prior to Visit  Medication Sig Dispense Refill   Accu-Chek FastClix Lancets MISC USE AS DIRECTED TO CHECK BLOOD SUGAR DAILY 102 each 3   ACCU-CHEK GUIDE test strip USE AS INSTRUCTED TO CHECK BLOOD SUGAR DAILY 100 strip 3   albuterol (PROVENTIL) (2.5 MG/3ML) 0.083% nebulizer solution USE 1 VIAL VIA NEBULIZER EVERY 6 HOURS AS NEEDED 150 mL 3   albuterol (VENTOLIN HFA) 108 (90 Base) MCG/ACT inhaler TAKE 2 PUFFS BY MOUTH EVERY 6 HOURS AS NEEDED FOR WHEEZE OR SHORTNESS OF BREATH 8.5  each 5   ascorbic acid (VITAMIN C) 500 MG tablet Take 1 tablet (500 mg total) by mouth daily. 30 tablet 0   aspirin 81 MG EC tablet Take 1 tablet (81 mg total) by mouth daily. 30 tablet 0   cholecalciferol (VITAMIN D3) 10 MCG (400 UNIT) TABS tablet Take 400 Units by mouth daily.     cyanocobalamin (V-R VITAMIN B-12) 500 MCG tablet Take 1 tablet (500 mcg total) by mouth daily.     finasteride (PROSCAR) 5 MG tablet Take 1 tablet (5 mg total) by mouth daily. 90 tablet 3   fluticasone furoate-vilanterol (BREO ELLIPTA) 200-25 MCG/ACT AEPB Inhale 1 puff into the lungs daily. 3 each 4   hydrocortisone 2.5 % ointment Apply topically.     metFORMIN (GLUCOPHAGE) 500 MG tablet Take 1 tablet (500 mg total) by mouth 2 (two) times daily with a meal. 180 tablet 3   montelukast (SINGULAIR) 10 MG tablet Take 1 tablet (10 mg total) by mouth at bedtime. 90 tablet 3   pantoprazole (PROTONIX) 40 MG tablet Take 1 tablet (40 mg total) by mouth at bedtime. 90 tablet 2   rosuvastatin (CRESTOR) 40 MG tablet Take 1 tablet (40 mg total) by mouth daily. 90 tablet 3   Semaglutide (RYBELSUS) 7 MG TABS Take 7 mg by mouth daily. 90 tablet 3   valsartan-hydrochlorothiazide (DIOVAN-HCT) 160-25 MG tablet Take  1 tablet by mouth daily. 90 tablet 3   zinc sulfate 220 (50 Zn) MG capsule Take 1 capsule (220 mg total) by mouth daily. 30 capsule 0   fluticasone-salmeterol (ADVAIR DISKUS) 250-50 MCG/ACT AEPB Inhale 1 puff into the lungs in the morning and at bedtime. 180 each 3   No facility-administered medications prior to visit.     Per HPI unless specifically indicated in ROS section below Review of Systems  Objective:  BP 124/72   Pulse 93   Temp (!) 97.3 F (36.3 C) (Temporal)   Ht 5\' 10"  (1.778 m)   Wt 222 lb (100.7 kg)   SpO2 97%   BMI 31.85 kg/m   Wt Readings from Last 3 Encounters:  07/02/22 222 lb (100.7 kg)  03/09/22 226 lb (102.5 kg)  01/09/22 226 lb (102.5 kg)      Physical Exam Vitals and nursing note  reviewed.  Constitutional:      Appearance: Normal appearance. He is not ill-appearing.  Cardiovascular:     Rate and Rhythm: Normal rate and regular rhythm.     Pulses: Normal pulses.     Heart sounds: Murmur (3/6 systolic USB) heard.  Pulmonary:     Effort: Pulmonary effort is normal. No respiratory distress.     Breath sounds: Normal breath sounds. No wheezing, rhonchi or rales.  Chest:       Comments:  Reducible bulge to right upper chest wall superior to incision reproducible with cough or straining Palpable loose screw anterior chest wall Skin:    General: Skin is warm and dry.     Findings: Rash present.          Comments: Drying vesicular rash to posterior right upper back, few lesions to axilla and R chest  Neurological:     Mental Status: He is alert.  Psychiatric:        Mood and Affect: Mood normal.        Behavior: Behavior normal.       Results for orders placed or performed in visit on 03/13/22  HM DIABETES EYE EXAM  Result Value Ref Range   HM Diabetic Eye Exam No Retinopathy No Retinopathy    Assessment & Plan:   Problem List Items Addressed This Visit     Skin rash    Poison oak vs suspect more likely shingles rash.  It was predominantly itchy.  Overall drying, resolving.  No further treatment indicated.  He has not received shingrix vaccines.      S/P minimally invasive aortic valve replacement with bioprosthetic valve   Incisional hernia, without obstruction or gangrene - Primary    Possible incisional hernia superior to site of minimally invasive AV replacement 04/2019 Cornelius Moras).  Area is tender to palpation.  Check CXR to eval hardware.  Consider further imaging vs return to cardiothoracic surgery.  Pt agrees with plan. Not consistent with lipoma.       Relevant Orders   DG Chest 2 View     No orders of the defined types were placed in this encounter.   Orders Placed This Encounter  Procedures   DG Chest 2 View    Standing Status:    Future    Number of Occurrences:   1    Standing Expiration Date:   07/02/2023    Order Specific Question:   Reason for Exam (SYMPTOM  OR DIAGNOSIS REQUIRED)    Answer:   h/o minimally invasive AV replacement 04/2019, now with hernia to right upper  chest above incision site    Order Specific Question:   Preferred imaging location?    Answer:   [facility]    Patient Instructions  Rash may have been shingles vs poison oak - let us know if not improving over time.  For bulge, chest xray today to evaluate hardware.  We may refer you back to cardiothoracic surgeon vs another imaging study pending results.   Follow up plan: No follow-ups on file.  [name], MD

## 2022-07-02 NOTE — Patient Instructions (Addendum)
Rash may have been shingles vs poison oak - let us know if not improving over time.  For bulge, chest xray today to evaluate hardware.  We may refer you back to cardiothoracic surgeon vs another imaging study pending results.

## 2022-07-02 NOTE — Assessment & Plan Note (Signed)
Poison oak vs suspect more likely shingles rash.  It was predominantly itchy.  Overall drying, resolving.  No further treatment indicated.  He has not received shingrix vaccines.

## 2022-07-02 NOTE — Assessment & Plan Note (Signed)
Possible incisional hernia superior to site of minimally invasive AV replacement 04/2019 Erik Allen).  Area is tender to palpation.  Check CXR to eval hardware.  Consider further imaging vs return to cardiothoracic surgery.  Pt agrees with plan. Not consistent with lipoma.

## 2022-07-09 ENCOUNTER — Other Ambulatory Visit: Payer: Self-pay | Admitting: Family Medicine

## 2022-07-09 DIAGNOSIS — Z953 Presence of xenogenic heart valve: Secondary | ICD-10-CM

## 2022-07-09 DIAGNOSIS — K432 Incisional hernia without obstruction or gangrene: Secondary | ICD-10-CM

## 2022-07-18 ENCOUNTER — Telehealth: Payer: Self-pay | Admitting: Family Medicine

## 2022-07-18 DIAGNOSIS — R222 Localized swelling, mass and lump, trunk: Secondary | ICD-10-CM

## 2022-07-18 DIAGNOSIS — K432 Incisional hernia without obstruction or gangrene: Secondary | ICD-10-CM

## 2022-07-18 NOTE — Telephone Encounter (Signed)
Received message from thoracic surgery clinic - Dr Thomasenia Bottoms agrees to see pt but requesting chest CT prior to scheduling appt.  I've ordered chest CT scan for this pt.  Plz let pt know to expect call to schedule CT.  I will also forward to referral team to be aware.

## 2022-07-18 NOTE — Telephone Encounter (Signed)
Spoke with pt relaying Dr. Timoteo Expose message. Pt verbalizes understanding and expresses his thanks for the "heads up".

## 2022-07-19 NOTE — Telephone Encounter (Signed)
Pt is aware of the scheduling #  of [phone]

## 2022-07-22 ENCOUNTER — Other Ambulatory Visit: Payer: Self-pay | Admitting: Family Medicine

## 2022-07-22 DIAGNOSIS — J4520 Mild intermittent asthma, uncomplicated: Secondary | ICD-10-CM

## 2022-07-24 ENCOUNTER — Other Ambulatory Visit: Payer: Self-pay | Admitting: Family Medicine

## 2022-07-24 DIAGNOSIS — I10 Essential (primary) hypertension: Secondary | ICD-10-CM

## 2022-07-24 DIAGNOSIS — N400 Enlarged prostate without lower urinary tract symptoms: Secondary | ICD-10-CM

## 2022-07-31 ENCOUNTER — Ambulatory Visit
Admission: RE | Admit: 2022-07-31 | Discharge: 2022-07-31 | Disposition: A | Discharge status: Home / Self Care | Payer: 59 | Source: Ambulatory Visit | Attending: Family Medicine | Admitting: Family Medicine

## 2022-07-31 DIAGNOSIS — R222 Localized swelling, mass and lump, trunk: Secondary | ICD-10-CM | POA: Diagnosis present

## 2022-07-31 DIAGNOSIS — K432 Incisional hernia without obstruction or gangrene: Secondary | ICD-10-CM | POA: Insufficient documentation

## 2022-08-11 ENCOUNTER — Other Ambulatory Visit: Payer: Self-pay | Admitting: Family Medicine

## 2022-08-11 DIAGNOSIS — E119 Type 2 diabetes mellitus without complications: Secondary | ICD-10-CM

## 2022-08-21 ENCOUNTER — Other Ambulatory Visit: Payer: Self-pay | Admitting: Family Medicine

## 2022-08-21 NOTE — Telephone Encounter (Signed)
Change in therapy. Pt ins co no longer covers Wixela (Advair Diskus). Rx changed to Breo Ellipta 200-25 mcg (see 03/09/22 OV notes). New rx printed on 03/09/22, #3 ea/4.  Request denied.

## 2022-08-27 ENCOUNTER — Encounter: Payer: 59 | Admitting: Thoracic Surgery (Cardiothoracic Vascular Surgery)

## 2022-08-28 NOTE — Progress Notes (Unsigned)
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 Pt had over the past several months the sensation of his lung herniating when he coughs. It sometimes feels like a "tearing" sensation on vigorous coughing. Pt does not smoke but has issues with mucus.      Past Medical History:  Diagnosis Date   Aortic stenosis 06/21/2014   Echo 01/2018: Mild LVH, normal sys fxn EF 55-60%, G1DD, moderate AS with mildly dilated LA. rec yearly echocardiogram   Asthma 09/13/99   BENIGN PROSTATIC HYPERTROPHY 05/14/2003   CAP (community acquired pneumonia) 01/31/2015   COVID-19 virus infection 06/2019   Diabetes mellitus without complication (HCC)    Disorder of vocal cord 2012   h/o thrush on vocal cords per patient   GERD (gastroesophageal reflux disease) 10/13/01   ENT eval for GERD and thrush 2011   History of kidney stones    HTN (hypertension)    Hyperlipemia 10/13/97   S/P minimally invasive aortic valve replacement with bioprosthetic valve 04/29/2019   Edwards Inspiris Resilia stented bovine pericardial tissue valve (size 23 mm)   Seasonal allergic rhinitis    worse in spring   Systolic murmur    Transaminitis 2014   presumed fatty liver without [place], normal iron and viral hep panel   Vertigo    none - over 1 year    Past Surgical History:  Procedure Laterality Date   A1 pulley release  10/2010   stensosing tenosynovitis, R milddle, ring, little fingers   AORTIC VALVE REPLACEMENT N/A 04/29/2019   Procedure: MINIMALLY INVASIVE AORTIC VALVE REPLACEMENT (AVR) USING INSPIRIS VALVE SIZE ;  Surgeon: Purcell Nails,  MD;  Location: [facility] OR;  Service: Open Heart Surgery;  Laterality: N/A;   COLONOSCOPY  2010   WNL, rpt due 10 yrs   CYSTECTOMY     from back   CYSTOSCOPY  09/2000   Stone retrieval   DIRECT LARYNGOSCOPY N/A 07/12/2020   DIRECT LARYNGOSCOPY WITH EXCISION OF VOCAL CORD NODULE [name], Renae Fickle, MD)   EAR CYST EXCISION Left 08/02/2016   Procedure: EXCISION MUCOID CYST LEFT MIDDLE FINGER WITH DISTAL INTERPHALANGEAL JOINT ARHTROTOMY;  Surgeon: Cindee Salt, MD;  Location: [facility];  Service: Orthopedics;  Laterality: Left;   FOOT CAPSULE RELEASE W/ PERCUTANEOUS HEEL CORD LENGTHENING, TIBIAL TENDON TRANSFER  05/2007   bilateral, Dr. Rolland Bimler HERNIA REPAIR  06/28/2009   left, with mesh, Dr. Lemar Livings   RIGHT/LEFT HEART CATH AND CORONARY ANGIOGRAPHY Bilateral 04/06/2019   Procedure: RIGHT/LEFT HEART CATH AND CORONARY ANGIOGRAPHY;  Surgeon: Iran Ouch, MD;  Location: [facility] IN[facility];  Service: Cardiovascular;  Laterality: Bilateral;   ROTATION FLAP Left 08/02/2016   Procedure: ROTATION DORSAL FLAP;  Surgeon: Cindee Salt, MD;  Location: [facility];  Service: Orthopedics;  Laterality: Left;   TEE WITHOUT CARDIOVERSION N/A 04/29/2019   Procedure: TRANSESOPHAGEAL ECHOCARDIOGRAM (TEE);  Surgeon: Purcell Nails, MD;  Location: [facility] OR;  Service: Open Heart Surgery;  Laterality: N/A;   US ECHOCARDIOGRAPHY  10/1999   TR, MR    Social History   Tobacco Use  Smoking Status Never  Smokeless Tobacco Never    Social History   Substance and Sexual Activity  Alcohol Use Yes   Alcohol/week: 1.0 - 2.0 standard drink of alcohol   Types: 1 - 2 Standard drinks or equivalent per week   Comment: occassionally    Social History   Socioeconomic History   Marital status: Single    Spouse name: Not on file   Number of children: Not on file   Years of education: Not on file   Highest education level: Not on file  Occupational History   Occupation: TEAM LEADER     Employer: [employer]    Comment: Sheet metal fabrication  Tobacco Use   Smoking status: Never   Smokeless tobacco: Never  Vaping Use   Vaping status: Never Used  Substance and Sexual Activity   Alcohol use: Yes    Alcohol/week: 1.0 - 2.0 standard drink of alcohol    Types: 1 - 2 Standard drinks or equivalent per week    Comment: occassionally   Drug use: No   Sexual activity: Yes  Other Topics Concern   Not on file  Social History Narrative   Caffeine: a couple mountain dews/day, occasional coffee   Lives alone, no pets   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, 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    Activity: no regular exercise, trying to restart routine   Diet: good water, vegetables daily, some fish/meat   Social Determinants of Corporate investment banker Strain: Not on file  Food Insecurity: Not on file  Transportation Needs: Not on file  Physical Activity: Not on file  Stress: Not on file  Social Connections: Not on file  Intimate Partner Violence: Not on file    Allergies  Allergen Reactions   Ace Inhibitors Cough   Codeine Other (See Comments)    REACTION: DIZZY / HEADACHE   Tramadol Nausea And Vomiting    Current Outpatient Medications  Medication Sig Dispense Refill   Accu-Chek FastClix Lancets MISC USE AS DIRECTED TO CHECK BLOOD SUGAR DAILY 102 each 3   ACCU-CHEK GUIDE test strip USE AS INSTRUCTED TO CHECK BLOOD SUGAR DAILY 100 strip 3   albuterol (PROVENTIL) (2.5 MG/3ML) 0.083% nebulizer solution USE 1 VIAL VIA NEBULIZER EVERY 6 HOURS AS NEEDED 150 mL 3   albuterol (VENTOLIN HFA) 108 (90 Base) MCG/ACT inhaler TAKE 2 PUFFS BY MOUTH EVERY 6 HOURS AS NEEDED FOR WHEEZE OR SHORTNESS OF BREATH 8.5 each 5   ascorbic acid (VITAMIN C) 500 MG tablet Take 1 tablet (500 mg total) by mouth daily. 30 tablet 0   aspirin 81 MG EC tablet Take 1 tablet (81 mg total) by mouth daily. 30 tablet 0   cholecalciferol (VITAMIN D3) 10 MCG (400 UNIT) TABS tablet Take 400  Units by mouth daily.     cyanocobalamin (V-R VITAMIN B-12) 500 MCG tablet Take 1 tablet (500 mcg total) by mouth daily.     finasteride (PROSCAR) 5 MG tablet TAKE 1 TABLET (5 MG TOTAL) BY MOUTH DAILY. 90 tablet 0   fluticasone furoate-vilanterol (BREO ELLIPTA) 200-25 MCG/ACT AEPB Inhale 1 puff into the lungs daily. 3 each 4   hydrocortisone 2.5 % ointment Apply topically.     metFORMIN (GLUCOPHAGE) 500 MG tablet Take 1 tablet (500 mg total) by mouth 2 (two) times daily with a meal. 180 tablet 3  montelukast (SINGULAIR) 10 MG tablet TAKE 1 TABLET BY MOUTH EVERYDAY AT BEDTIME 90 tablet 0   pantoprazole (PROTONIX) 40 MG tablet Take 1 tablet (40 mg total) by mouth at bedtime. 90 tablet 2   rosuvastatin (CRESTOR) 40 MG tablet Take 1 tablet (40 mg total) by mouth daily. 90 tablet 3   Semaglutide (RYBELSUS) 7 MG TABS Take 7 mg by mouth daily. 90 tablet 3   valsartan-hydrochlorothiazide (DIOVAN-HCT) 160-25 MG tablet TAKE 1 TABLET BY MOUTH EVERY DAY 90 tablet 0   zinc sulfate 220 (50 Zn) MG capsule Take 1 capsule (220 mg total) by mouth daily. 30 capsule 0   No current facility-administered medications for this visit.     Family History  Problem Relation Age of Onset   Hypertension Mother    Cancer Father 31       Lymphoma, recurrent   Hypertension Father    Hypertension Brother        Physical Exam: Lungs: clear Card: rr Incision with small bulge just above incision when coughing. He has a plate screw which is just under the skin     Diagnostic Studies & Laboratory data: I have personally reviewed the following studies and agree with the findings     Recent Radiology Findings:   CT (08/2022) FINDINGS: Cardiovascular: Stable appearance of aortic valve. No evidence aneurysmal disease of the thoracic aorta. Stable top-normal heart size and calcified coronary artery plaque. Stable calcified mitral valve annulus. No pericardial fluid. Stable top-normal caliber of central pulmonary  arteries.   Mediastinum/Nodes: No enlarged mediastinal or axillary lymph nodes. Thyroid gland, trachea, and esophagus demonstrate no significant findings.   Lungs/Pleura: Mild scattered pulmonary scarring in areas of prior pneumonia as seen by prior CT. Some postinflammatory nodularity is also present in both lungs including a 3 mm peripheral region of nodularity in the superior segment of the right lower lobe on image 74/3, 4 mm right lower lobe nodule on image 88, and 2 mm left lower lobe and 3 mm lingular peripheral nodules on image 83.   There is no evidence of pulmonary edema, consolidation, pneumothorax or pleural fluid.   Upper Abdomen: Hepatic steatosis.   Musculoskeletal: At the level of clinical concern, a vitamin-E tablet was placed on the patient's skin. In this region, there is no underlying abnormal mass or subcutaneous abnormality. The skin is slightly redundant in this region, potentially related to prior surgery. This focal area is also just above the region of prior right chest wall reconstruction and the extended reconstruction plate across the anterior second rib and costal cartilage extending into the sternum. Hardware appears stable. Second shorter reconstruction plate is also stable and present more inferiorly at the level of the sternum and distal third costal cartilage.   IMPRESSION: 1. No evidence of abnormal mass or subcutaneous abnormality at the level of clinical concern in the right upper anterior chest wall. The skin is slightly redundant in this region, potentially related to prior surgery. This focal area is also just above the region of prior right chest wall reconstruction and the extended reconstruction plate across the anterior second rib and costal cartilage extending into the sternum. Hardware appears stable. 2. Mild scattered pulmonary scarring in areas of prior pneumonia seen by prior CT. Some probable postinflammatory nodularity is  also present in both lungs measuring up to 4 mm. No follow-up needed if patient is low-risk (and has no known or suspected primary neoplasm). Non-contrast chest CT can be considered in 12 months if patient  is high-risk. This recommendation follows the consensus statement: Guidelines for Management of Incidental Pulmonary Nodules Detected on CT Images: From the Fleischner Society 2017; Radiology 2017; 284:228-243. 3. Hepatic steatosis. 4. Stable appearance of aortic valve and calcified coronary artery plaque.      Recent Lab Findings: Lab Results  Component Value Date   WBC 11.9 (H) 06/26/2019   HGB 12.8 (L) 06/26/2019   HCT 39.2 06/26/2019   PLT 283 06/26/2019   GLUCOSE 124 (H) 08/29/2021   CHOL 150 08/29/2021   TRIG 218.0 (H) 08/29/2021   HDL 51.80 08/29/2021   LDLDIRECT 75.0 08/29/2021   LDLCALC 70 08/14/2019   ALT 68 (H) 08/29/2021   AST 51 (H) 08/29/2021   NA 139 08/29/2021   K 4.2 08/29/2021   CL 101 08/29/2021   CREATININE 0.96 08/29/2021   BUN 19 08/29/2021   CO2 29 08/29/2021   TSH 1.64 11/05/2016   INR 1.3 (H) 04/29/2019   HGBA1C 6.9 (A) 03/09/2022      Assessment / Plan:     67 yo male with lung herniation with coughing following mini AVR. Pt on CT without any chronic bulging of lung without coughing. We discussed the operation requiring mesh repair of the defect btw ribs and could be performed when pt having discomfort enough to warrant surgery and that for now would not be a risk at its present state. Pt wishes to follow his symptoms for now   I have spent 60 min in review of the records, viewing studies and in face to face with patient and in coordination of future care    Eugenio Hoes 08/28/2022 12:42 PM

## 2022-08-29 ENCOUNTER — Institutional Professional Consult (permissible substitution): Payer: Medicare PPO | Admitting: Thoracic Surgery (Cardiothoracic Vascular Surgery)

## 2022-08-29 VITALS — BP 156/80 | HR 93 | Resp 18 | Ht 70.0 in | Wt 224.0 lb

## 2022-08-29 DIAGNOSIS — Z952 Presence of prosthetic heart valve: Secondary | ICD-10-CM | POA: Diagnosis not present

## 2022-08-29 DIAGNOSIS — K432 Incisional hernia without obstruction or gangrene: Secondary | ICD-10-CM | POA: Diagnosis not present

## 2022-08-29 NOTE — Patient Instructions (Signed)
Call if hernia causes discomfort enough to warrant surgery

## 2022-09-03 ENCOUNTER — Other Ambulatory Visit: Payer: Self-pay | Admitting: Family Medicine

## 2022-09-03 DIAGNOSIS — I10 Essential (primary) hypertension: Secondary | ICD-10-CM

## 2022-09-03 DIAGNOSIS — E559 Vitamin D deficiency, unspecified: Secondary | ICD-10-CM

## 2022-09-03 DIAGNOSIS — N400 Enlarged prostate without lower urinary tract symptoms: Secondary | ICD-10-CM

## 2022-09-03 DIAGNOSIS — E1169 Type 2 diabetes mellitus with other specified complication: Secondary | ICD-10-CM

## 2022-09-03 DIAGNOSIS — M8580 Other specified disorders of bone density and structure, unspecified site: Secondary | ICD-10-CM

## 2022-09-05 ENCOUNTER — Other Ambulatory Visit (INDEPENDENT_AMBULATORY_CARE_PROVIDER_SITE_OTHER): Payer: Medicare PPO

## 2022-09-05 DIAGNOSIS — M8580 Other specified disorders of bone density and structure, unspecified site: Secondary | ICD-10-CM

## 2022-09-05 DIAGNOSIS — I10 Essential (primary) hypertension: Secondary | ICD-10-CM | POA: Diagnosis not present

## 2022-09-05 DIAGNOSIS — E559 Vitamin D deficiency, unspecified: Secondary | ICD-10-CM

## 2022-09-05 DIAGNOSIS — N400 Enlarged prostate without lower urinary tract symptoms: Secondary | ICD-10-CM | POA: Diagnosis not present

## 2022-09-05 DIAGNOSIS — E1169 Type 2 diabetes mellitus with other specified complication: Secondary | ICD-10-CM

## 2022-09-05 DIAGNOSIS — E785 Hyperlipidemia, unspecified: Secondary | ICD-10-CM | POA: Diagnosis not present

## 2022-09-05 LAB — MICROALBUMIN / CREATININE URINE RATIO
Creatinine,U: 202.3 mg/dL
Microalb Creat Ratio: 0.9 mg/g (ref 0.0–30.0)
Microalb, Ur: 1.7 mg/dL (ref 0.0–1.9)

## 2022-09-05 LAB — CBC WITH DIFFERENTIAL/PLATELET
Basophils Absolute: 0.1 10*3/uL (ref 0.0–0.1)
Basophils Relative: 0.6 % (ref 0.0–3.0)
Eosinophils Absolute: 0.1 10*3/uL (ref 0.0–0.7)
Eosinophils Relative: 1.4 % (ref 0.0–5.0)
HCT: 43.7 % (ref 39.0–52.0)
Hemoglobin: 14.2 g/dL (ref 13.0–17.0)
Lymphocytes Relative: 31.5 % (ref 12.0–46.0)
Lymphs Abs: 3 10*3/uL (ref 0.7–4.0)
MCHC: 32.4 g/dL (ref 30.0–36.0)
MCV: 94.5 fl (ref 78.0–100.0)
Monocytes Absolute: 0.7 10*3/uL (ref 0.1–1.0)
Monocytes Relative: 7 % (ref 3.0–12.0)
Neutro Abs: 5.6 10*3/uL (ref 1.4–7.7)
Neutrophils Relative %: 59.5 % (ref 43.0–77.0)
Platelets: 208 10*3/uL (ref 150.0–400.0)
RBC: 4.63 Mil/uL (ref 4.22–5.81)
RDW: 14.4 % (ref 11.5–15.5)
WBC: 9.4 10*3/uL (ref 4.0–10.5)

## 2022-09-05 LAB — COMPREHENSIVE METABOLIC PANEL
ALT: 60 U/L — ABNORMAL HIGH (ref 0–53)
AST: 34 U/L (ref 0–37)
Albumin: 4.4 g/dL (ref 3.5–5.2)
Alkaline Phosphatase: 47 U/L (ref 39–117)
BUN: 22 mg/dL (ref 6–23)
CO2: 31 mEq/L (ref 19–32)
Calcium: 9.6 mg/dL (ref 8.4–10.5)
Chloride: 99 mEq/L (ref 96–112)
Creatinine, Ser: 1.01 mg/dL (ref 0.40–1.50)
GFR: 78.32 mL/min (ref >60.00)
Glucose, Bld: 123 mg/dL — ABNORMAL HIGH (ref 70–99)
Potassium: 4.2 mEq/L (ref 3.5–5.1)
Sodium: 139 mEq/L (ref 135–145)
Total Bilirubin: 0.6 mg/dL (ref 0.2–1.2)
Total Protein: 7 g/dL (ref 6.0–8.3)

## 2022-09-05 LAB — TSH: TSH: 1.84 u[IU]/mL (ref 0.35–5.50)

## 2022-09-05 LAB — LIPID PANEL
Cholesterol: 146 mg/dL (ref 0–200)
HDL: 54.3 mg/dL (ref >39.00)
LDL Cholesterol: 61 mg/dL (ref 0–99)
NonHDL: 91.99
Total CHOL/HDL Ratio: 3
Triglycerides: 155 mg/dL — ABNORMAL HIGH (ref 0.0–149.0)
VLDL: 31 mg/dL (ref 0.0–40.0)

## 2022-09-05 LAB — PSA: PSA: 0.17 ng/mL (ref 0.10–4.00)

## 2022-09-05 LAB — HEMOGLOBIN A1C: Hgb A1c MFr Bld: 7 % — ABNORMAL HIGH (ref 4.6–6.5)

## 2022-09-05 LAB — VITAMIN D 25 HYDROXY (VIT D DEFICIENCY, FRACTURES): VITD: 35.45 ng/mL (ref 30.00–100.00)

## 2022-09-07 ENCOUNTER — Other Ambulatory Visit: Payer: Self-pay | Admitting: Family Medicine

## 2022-09-07 DIAGNOSIS — E1169 Type 2 diabetes mellitus with other specified complication: Secondary | ICD-10-CM

## 2022-09-07 NOTE — Telephone Encounter (Signed)
Pharmacy is requesting an alternative medication due to not being covered. Please review and adivse

## 2022-09-07 NOTE — Telephone Encounter (Signed)
Please notify patient - rybelsus is no longer covered by his insurance.  Is he willing to try weekly injection? If not, we will need to discuss alternative medicine.  He could also call his insurance to see if any other med in this family is better covered.   Lab Results  Component Value Date   HGBA1C 7.0 (H) 09/05/2022

## 2022-09-12 ENCOUNTER — Ambulatory Visit (INDEPENDENT_AMBULATORY_CARE_PROVIDER_SITE_OTHER): Payer: Self-pay | Admitting: Family Medicine

## 2022-09-12 ENCOUNTER — Encounter: Payer: Self-pay | Admitting: Family Medicine

## 2022-09-12 VITALS — BP 150/88 | HR 97 | Temp 97.5°F | Ht 67.5 in | Wt 222.5 lb

## 2022-09-12 DIAGNOSIS — Z7189 Other specified counseling: Secondary | ICD-10-CM

## 2022-09-12 DIAGNOSIS — E119 Type 2 diabetes mellitus without complications: Secondary | ICD-10-CM | POA: Diagnosis not present

## 2022-09-12 DIAGNOSIS — E669 Obesity, unspecified: Secondary | ICD-10-CM

## 2022-09-12 DIAGNOSIS — Z953 Presence of xenogenic heart valve: Secondary | ICD-10-CM

## 2022-09-12 DIAGNOSIS — I060 Rheumatic aortic stenosis: Secondary | ICD-10-CM

## 2022-09-12 DIAGNOSIS — K760 Fatty (change of) liver, not elsewhere classified: Secondary | ICD-10-CM

## 2022-09-12 DIAGNOSIS — N400 Enlarged prostate without lower urinary tract symptoms: Secondary | ICD-10-CM

## 2022-09-12 DIAGNOSIS — I10 Essential (primary) hypertension: Secondary | ICD-10-CM

## 2022-09-12 DIAGNOSIS — K432 Incisional hernia without obstruction or gangrene: Secondary | ICD-10-CM

## 2022-09-12 DIAGNOSIS — E66811 Obesity, class 1: Secondary | ICD-10-CM

## 2022-09-12 DIAGNOSIS — E559 Vitamin D deficiency, unspecified: Secondary | ICD-10-CM

## 2022-09-12 DIAGNOSIS — K219 Gastro-esophageal reflux disease without esophagitis: Secondary | ICD-10-CM

## 2022-09-12 DIAGNOSIS — Z0000 Encounter for general adult medical examination without abnormal findings: Secondary | ICD-10-CM

## 2022-09-12 DIAGNOSIS — E1169 Type 2 diabetes mellitus with other specified complication: Secondary | ICD-10-CM

## 2022-09-12 DIAGNOSIS — Z1211 Encounter for screening for malignant neoplasm of colon: Secondary | ICD-10-CM

## 2022-09-12 DIAGNOSIS — J4520 Mild intermittent asthma, uncomplicated: Secondary | ICD-10-CM

## 2022-09-12 DIAGNOSIS — E785 Hyperlipidemia, unspecified: Secondary | ICD-10-CM

## 2022-09-12 MED ORDER — ACCU-CHEK FASTCLIX LANCETS MISC
3 refills | Status: AC
Start: 2022-09-12 — End: ?

## 2022-09-12 MED ORDER — ROSUVASTATIN CALCIUM 40 MG PO TABS
40.0000 mg | ORAL_TABLET | Freq: Every day | ORAL | 4 refills | Status: DC
Start: 2022-09-12 — End: 2023-09-13

## 2022-09-12 MED ORDER — FLUTICASONE FUROATE-VILANTEROL 200-25 MCG/ACT IN AEPB: 1.0000 | INHALATION_SPRAY | Freq: Every day | RESPIRATORY_TRACT | 3 refills | Status: DC

## 2022-09-12 MED ORDER — VALSARTAN-HYDROCHLOROTHIAZIDE 160-25 MG PO TABS
1.0000 | ORAL_TABLET | Freq: Every day | ORAL | 4 refills | Status: DC
Start: 2022-09-12 — End: 2023-09-13

## 2022-09-12 MED ORDER — MONTELUKAST SODIUM 10 MG PO TABS
10.0000 mg | ORAL_TABLET | Freq: Every day | ORAL | 4 refills | Status: DC
Start: 2022-09-12 — End: 2023-09-13

## 2022-09-12 MED ORDER — FINASTERIDE 5 MG PO TABS
5.0000 mg | ORAL_TABLET | Freq: Every day | ORAL | 4 refills | Status: DC
Start: 2022-09-12 — End: 2023-09-13

## 2022-09-12 MED ORDER — PANTOPRAZOLE SODIUM 40 MG PO TBEC
40.0000 mg | DELAYED_RELEASE_TABLET | Freq: Every day | ORAL | 4 refills | Status: DC
Start: 2022-09-12 — End: 2023-09-13

## 2022-09-12 MED ORDER — METFORMIN HCL 500 MG PO TABS
500.0000 mg | ORAL_TABLET | Freq: Two times a day (BID) | ORAL | 4 refills | Status: DC
Start: 2022-09-12 — End: 2023-09-13

## 2022-09-12 NOTE — Progress Notes (Signed)
Ph: [phone] Fax: [phone]   Patient ID: Erik Allen, male    DOB: 07-11-1955, 67 y.o.   MRN: [id number]  This visit was conducted in person.  BP (!) 150/88 (BP Location: Right Arm, Cuff Size: Normal)   Pulse 97   Temp (!) 97.5 F (36.4 C) (Temporal)   Ht 5' 7.5" (1.715 m)   Wt 222 lb 8 oz (100.9 kg)   SpO2 97%   BMI 34.33 kg/m   BP Readings from Last 3 Encounters:  09/12/22 (!) 150/88  08/29/22 (!) 156/80  07/02/22 124/72   CC: welcome to medicare Subjective:   HPI: Erik Allen is a 67 y.o. male presenting on 09/12/2022 for Medicare Wellness (Notified pt his insurance co no longer covers Rybelsus. Wants to try a wkly inj but wants to discuss with Dr. Reece Agar. )   Fully retired 02/11/2020.   Hearing Screening   500Hz  1000Hz  2000Hz  4000Hz   Right ear 20 20 20  40  Left ear 20 40 20 0  Vision Screening - Comments:: Last eye exam, 09/2021.  Flowsheet Row Office Visit from 09/12/2022 in [facility] [place]  PHQ-2 Total Score 0          09/12/2022    8:19 AM 07/02/2022   11:54 AM 03/09/2022    8:13 AM 05/22/2019   10:00 AM  Fall Risk   Falls in the past year? 1 0 0 0  Number falls in past yr: 0     Tripped over branch in the mud.   Severe AS s/p minimally invasive AVR with bioprosthetic valve 04/2019. Heart catheterization prior to surgery showed normal coronary arteries. Does need SBE ppx for future dental procedures.   Subsequent incisional lung hernia to R upper chest wall after surgery saw cardiothoracic surgeon - monitoring for now, will return of more symptomatic to consider surgical repair  Known asthmatic on singulair, advair 1 puff BID, PRN albuterol.    Known controlled diabetic on metformin and Rybelsus 7mg  daily - fasting sugars 110-130s. Rybelsus no longer covered by insurance - he will check with insurance on covered alternatives   Sees [facility] pulmonology Dr Karna Christmas for asthma and post-COVID infection, treated with  colchicine x2 mo.   Saw ENT [name]) for chronic hoarseness due to vocal cord nodules s/p direct laryngoscopy for excision with benefit - then referred to [facility] Delford Field) s/p repeat excision of benign recurrent vocal cord lesions. Sees yearly.   Preventative: Colonoscopy - 2010 WNL, rpt due 10 yrs. iFOB neg 08/2021, requests rpt iFOB Prostate - yearly screen, normal. Good stream, nocturia x1. Known BPH on proscar.  Lung cancer screen - not eligible  Flu shot yearly  COVID vaccine - discussed, declines  Pneumovax 2014, prevnar-20 08/2020 Tdap 2016 shingrix - discussed - declines RSV - discussed, to consider.  Advanced directive - discussed, has this at home. HCPOA - brother Cleto Coombe. Full code, would be ok life support. Ok with feeding tube as well.  Seat belt use discussed  Sunscreen use discussed, no changing moles on skin. Sees [place] derm q6 mo.  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 Interim medical history since our last visit reviewed. Allergies and medications reviewed and updated. Outpatient Medications Prior to Visit  Medication Sig Dispense Refill   ACCU-CHEK GUIDE test strip USE AS INSTRUCTED TO CHECK BLOOD SUGAR DAILY 100 strip 3   albuterol (PROVENTIL) (2.5 MG/3ML) 0.083% nebulizer solution USE 1 VIAL VIA NEBULIZER EVERY 6 HOURS AS NEEDED 150 mL 3   albuterol (VENTOLIN HFA) 108 (90 Base) MCG/ACT inhaler TAKE 2 PUFFS BY MOUTH EVERY 6 HOURS AS NEEDED FOR WHEEZE OR SHORTNESS OF BREATH 8.5 each 5   ascorbic acid (VITAMIN C) 500 MG tablet  Take 1 tablet (500 mg total) by mouth daily. 30 tablet 0   aspirin 81 MG EC tablet Take 1 tablet (81 mg total) by mouth daily. 30 tablet 0   cholecalciferol (VITAMIN D3) 10 MCG (400 UNIT) TABS tablet Take 400 Units by mouth daily.     cyanocobalamin (V-R VITAMIN B-12) 500 MCG tablet Take 1 tablet (500 mcg total) by mouth daily.     hydrocortisone 2.5 % ointment Apply topically.     zinc sulfate 220 (50 Zn) MG capsule Take 1 capsule (220 mg total) by mouth daily. 30 capsule 0   Accu-Chek FastClix Lancets MISC USE AS DIRECTED TO CHECK BLOOD SUGAR DAILY 102 each 3   finasteride (PROSCAR) 5 MG tablet TAKE 1 TABLET (5 MG TOTAL) BY MOUTH DAILY. 90 tablet 0   fluticasone furoate-vilanterol (BREO ELLIPTA) 200-25 MCG/ACT AEPB Inhale 1 puff into the lungs daily. 3 each 4   metFORMIN (GLUCOPHAGE) 500 MG tablet Take 1 tablet (500 mg total) by mouth 2 (two) times daily with a meal. 180 tablet 3   montelukast (SINGULAIR) 10 MG tablet TAKE 1 TABLET BY MOUTH EVERYDAY AT BEDTIME 90 tablet 0   pantoprazole (PROTONIX) 40 MG tablet Take 1 tablet (40 mg total) by mouth at bedtime. 90 tablet 2   rosuvastatin (CRESTOR) 40 MG tablet Take 1 tablet (40 mg total) by mouth daily. 90 tablet 3   valsartan-hydrochlorothiazide (DIOVAN-HCT) 160-25 MG tablet TAKE 1 TABLET BY MOUTH EVERY DAY 90 tablet 0   Semaglutide (RYBELSUS) 7 MG TABS TAKE 1 TABLET BY MOUTH EVERY DAY 90 tablet 0   No facility-administered medications prior to visit.     Per HPI unless specifically indicated in ROS section below Review of Systems  Objective:  BP (!) 150/88 (BP Location: Right Arm, Cuff Size: Normal)   Pulse 97   Temp (!) 97.5 F (36.4 C) (Temporal)   Ht 5' 7.5" (1.715 m)   Wt 222 lb 8 oz (100.9 kg)   SpO2 97%   BMI 34.33 kg/m   Wt Readings from Last 3 Encounters:  09/12/22 222 lb 8 oz (100.9 kg)  08/29/22 224 lb (101.6 kg)  07/02/22 222 lb (100.7 kg)      Physical Exam Vitals and nursing note reviewed.  Constitutional:       General: He is not in acute distress.    Appearance: Normal appearance. He is well-developed. He is not ill-appearing.  HENT:     Head: Normocephalic and atraumatic.     Right Ear: Hearing, tympanic membrane, ear canal and external ear normal.     Left Ear: Hearing, tympanic membrane, ear canal and external ear normal.     Nose: Nose normal.     Mouth/Throat:     Mouth: Mucous membranes are moist.     Pharynx: Oropharynx is clear. No  oropharyngeal exudate or posterior oropharyngeal erythema.  Eyes:     General: No scleral icterus.    Extraocular Movements: Extraocular movements intact.     Conjunctiva/sclera: Conjunctivae normal.     Pupils: Pupils are equal, round, and reactive to light.  Neck:     Thyroid: No thyroid mass or thyromegaly.     Vascular: No carotid bruit.  Cardiovascular:     Rate and Rhythm: Normal rate and regular rhythm.     Pulses: Normal pulses.          Radial pulses are 2+ on the right side and 2+ on the left side.     Heart sounds: Murmur (3/6 systolic throughout) heard.  Pulmonary:     Effort: Pulmonary effort is normal. No respiratory distress.     Breath sounds: Normal breath sounds. No wheezing, rhonchi or rales.  Abdominal:     General: Bowel sounds are normal. There is no distension.     Palpations: Abdomen is soft. There is no mass.     Tenderness: There is no abdominal tenderness. There is no guarding or rebound.     Hernia: No hernia is present.  Musculoskeletal:        General: Normal range of motion.     Cervical back: Normal range of motion and neck supple.     Right lower leg: No edema.     Left lower leg: No edema.  Lymphadenopathy:     Cervical: No cervical adenopathy.  Skin:    General: Skin is warm and dry.     Findings: No rash.  Neurological:     General: No focal deficit present.     Mental Status: He is alert and oriented to person, place, and time.     Comments:  Recall 2/3, 3/3 with recall Calculation 4/5 DLOW  Psychiatric:         Mood and Affect: Mood normal.        Behavior: Behavior normal.        Thought Content: Thought content normal.        Judgment: Judgment normal.       Results for orders placed or performed in visit on 09/05/22  TSH  Result Value Ref Range   TSH 1.84 0.35 - 5.50 uIU/mL  CBC with Differential/Platelet  Result Value Ref Range   WBC 9.4 4.0 - 10.5 K/uL   RBC 4.63 4.22 - 5.81 Mil/uL   Hemoglobin 14.2 13.0 - 17.0 g/dL   HCT 32.4 40.1 - 02.7 %   MCV 94.5 78.0 - 100.0 fl   MCHC 32.4 30.0 - 36.0 g/dL   RDW 25.3 66.4 - 40.3 %   Platelets 208.0 150.0 - 400.0 K/uL   Neutrophils Relative % 59.5 43.0 - 77.0 %   Lymphocytes Relative 31.5 12.0 - 46.0 %   Monocytes Relative 7.0 3.0 - 12.0 %   Eosinophils Relative 1.4 0.0 - 5.0 %   Basophils Relative 0.6 0.0 - 3.0 %   Neutro Abs 5.6 1.4 - 7.7 K/uL   Lymphs Abs 3.0 0.7 - 4.0 K/uL   Monocytes Absolute 0.7 0.1 - 1.0 K/uL   Eosinophils Absolute 0.1 0.0 - 0.7 K/uL   Basophils Absolute 0.1 0.0 - 0.1 K/uL  PSA  Result Value Ref Range   PSA 0.17 0.10 - 4.00 ng/mL  VITAMIN D 25 Hydroxy (Vit-D Deficiency, Fractures)  Result Value Ref Range   VITD 35.45 30.00 - 100.00 ng/mL  Microalbumin / creatinine urine ratio  Result Value  Ref Range   Microalb, Ur 1.7 0.0 - 1.9 mg/dL   Creatinine,U 213.0 mg/dL   Microalb Creat Ratio 0.9 0.0 - 30.0 mg/g  Hemoglobin A1c  Result Value Ref Range   Hgb A1c MFr Bld 7.0 (H) 4.6 - 6.5 %  Comprehensive metabolic panel  Result Value Ref Range   Sodium 139 135 - 145 mEq/L   Potassium 4.2 3.5 - 5.1 mEq/L   Chloride 99 96 - 112 mEq/L   CO2 31 19 - 32 mEq/L   Glucose, Bld 123 (H) 70 - 99 mg/dL   BUN 22 6 - 23 mg/dL   Creatinine, Ser 8.65 0.40 - 1.50 mg/dL   Total Bilirubin 0.6 0.2 - 1.2 mg/dL   Alkaline Phosphatase 47 39 - 117 U/L   AST 34 0 - 37 U/L   ALT 60 (H) 0 - 53 U/L   Total Protein 7.0 6.0 - 8.3 g/dL   Albumin 4.4 3.5 - 5.2 g/dL   GFR 78.46 >96.29 mL/min   Calcium 9.6 8.4 - 10.5 mg/dL  Lipid  panel  Result Value Ref Range   Cholesterol 146 0 - 200 mg/dL   Triglycerides 528.4 (H) 0.0 - 149.0 mg/dL   HDL 13.24 >40.10 mg/dL   VLDL 27.2 0.0 - 53.6 mg/dL   LDL Cholesterol 61 0 - 99 mg/dL   Total CHOL/HDL Ratio 3    NonHDL 91.99     Assessment & Plan:   Problem List Items Addressed This Visit     Advanced directives, counseling/discussion (Chronic)    Advanced directive - discussed, has this at home. HCPOA - brother Ryne Cornelson. Full code, would be ok life support. Ok with feeding tube as well.       Welcome to Medicare preventive visit - Primary (Chronic)    I have personally reviewed the Medicare Annual Wellness questionnaire and have noted 1. The patient's medical and social history 2. Their use of alcohol, tobacco or illicit drugs 3. Their current medications and supplements 4. The patient's functional ability including ADL's, fall risks, home safety risks and hearing or visual impairment. Cognitive function has been assessed and addressed as indicated.  5. Diet and physical activity 6. Evidence for depression or mood disorders The patients weight, height, BMI have been recorded in the chart. I have made referrals, counseling and provided education to the patient based on review of the above and I have provided the pt with a written personalized care plan for preventive services. Provider list updated.. See scanned questionairre as needed for further documentation. Reviewed preventative protocols and updated unless pt declined.  EKG deferred -as he regularly sees cardiology.       Vitamin D deficiency    Chronic, stable on vit D 400IU daily.       Hyperlipidemia associated with type 2 diabetes mellitus (HCC)    Chronic, stable on high potency statin - continue.  The 10-year ASCVD risk score (Arnett DK, et al., 2019) is: 27.7%   Values used to calculate the score:     Age: 79 years     Sex: Male     Is Non-Hispanic African American: No     Diabetic: Yes      Tobacco smoker: No     Systolic Blood Pressure: 154 mmHg     Is BP treated: Yes     HDL Cholesterol: 54.3 mg/dL     Total Cholesterol: 146 mg/dL       Relevant Medications   metFORMIN (GLUCOPHAGE) 500 MG  tablet   rosuvastatin (CRESTOR) 40 MG tablet   valsartan-hydrochlorothiazide (DIOVAN-HCT) 160-25 MG tablet   Essential hypertension    BP elevated. Notes home readings run ok. Will ask him to log BP readings at home over next few weeks and drop off log to review.  Continue valsartan/hydrochlorothiazide for now.       Relevant Medications   rosuvastatin (CRESTOR) 40 MG tablet   valsartan-hydrochlorothiazide (DIOVAN-HCT) 160-25 MG tablet   Asthma    Stable period on advair controller inhaler daily with albuterol rescue inhaler.       Relevant Medications   montelukast (SINGULAIR) 10 MG tablet   fluticasone furoate-vilanterol (BREO ELLIPTA) 200-25 MCG/ACT AEPB   GERD    Chronic, stable period on pantoprazole 40mg  daily.       Relevant Medications   pantoprazole (PROTONIX) 40 MG tablet   Benign prostatic hyperplasia    Chronic, stable period on proscar.       Relevant Medications   finasteride (PROSCAR) 5 MG tablet   Type 2 diabetes mellitus with other specified complication (HCC)    Chronic, overall stable period on metformin 500mg  BID.  Rybelsus no longer covered by new insurance - I asked him to contact insurance to see if ozempic or trulicity are preferred and let me know what he finds out.      Relevant Medications   Accu-Chek FastClix Lancets MISC   metFORMIN (GLUCOPHAGE) 500 MG tablet   rosuvastatin (CRESTOR) 40 MG tablet   valsartan-hydrochlorothiazide (DIOVAN-HCT) 160-25 MG tablet   Obesity, Class I, BMI 30.0-34.9 (see actual BMI)    Continue to encourage healthy diet and lifestyle choices to affect sustainable weight loss.       Fatty liver disease, nonalcoholic   Aortic stenosis    S/p AV repair 2021. Appreciate cardiac care.       Relevant Medications    rosuvastatin (CRESTOR) 40 MG tablet   valsartan-hydrochlorothiazide (DIOVAN-HCT) 160-25 MG tablet   S/P minimally invasive aortic valve replacement with bioprosthetic valve   Incisional hernia, without obstruction or gangrene    S/p thoracic surgery evaluation - planned monitoring for now.       Other Visit Diagnoses     Controlled type 2 diabetes mellitus without complication, without long-term current use of insulin (HCC)       Relevant Medications   Accu-Chek FastClix Lancets MISC   metFORMIN (GLUCOPHAGE) 500 MG tablet   rosuvastatin (CRESTOR) 40 MG tablet   valsartan-hydrochlorothiazide (DIOVAN-HCT) 160-25 MG tablet   Special screening for malignant neoplasms, colon       Relevant Orders   Fecal occult blood, imunochemical        Meds ordered this encounter  Medications   Accu-Chek FastClix Lancets MISC    Sig: Use as directed to check blood sugar daily    Dispense:  102 each    Refill:  3   finasteride (PROSCAR) 5 MG tablet    Sig: Take 1 tablet (5 mg total) by mouth daily.    Dispense:  90 tablet    Refill:  4   metFORMIN (GLUCOPHAGE) 500 MG tablet    Sig: Take 1 tablet (500 mg total) by mouth 2 (two) times daily with a meal.    Dispense:  180 tablet    Refill:  4   montelukast (SINGULAIR) 10 MG tablet    Sig: Take 1 tablet (10 mg total) by mouth at bedtime.    Dispense:  90 tablet    Refill:  4   pantoprazole (  PROTONIX) 40 MG tablet    Sig: Take 1 tablet (40 mg total) by mouth at bedtime.    Dispense:  90 tablet    Refill:  4   rosuvastatin (CRESTOR) 40 MG tablet    Sig: Take 1 tablet (40 mg total) by mouth daily.    Dispense:  90 tablet    Refill:  4   fluticasone furoate-vilanterol (BREO ELLIPTA) 200-25 MCG/ACT AEPB    Sig: Inhale 1 puff into the lungs daily.    Dispense:  90 each    Refill:  3   valsartan-hydrochlorothiazide (DIOVAN-HCT) 160-25 MG tablet    Sig: Take 1 tablet by mouth daily.    Dispense:  90 tablet    Refill:  4    Orders Placed  This Encounter  Procedures   Fecal occult blood, imunochemical    Standing Status:   Future    Standing Expiration Date:   09/12/2023    Patient Instructions  Pass by lab to pick up stool kit.  Monitor BP at home using log provided today, then drop off in 1-2 weeks to review Bring [place] a copy of your living will Rybelsus is no longer covered by new insurance - call them to ask about covered alternatives (Ozempic or Trulicity).  Consider RSV shot through pharmacy.  Good to see you today Return as needed or in 6 months for diabetes check   Follow up plan: Return in about 6 months (around 03/15/2023) for follow up visit.  [name], MD

## 2022-09-12 NOTE — Assessment & Plan Note (Signed)
Chronic, stable period on pantoprazole '40mg'$  daily.

## 2022-09-12 NOTE — Assessment & Plan Note (Signed)
Chronic, stable period on proscar.

## 2022-09-12 NOTE — Assessment & Plan Note (Signed)
BP elevated. Notes home readings run ok. Will ask him to log BP readings at home over next few weeks and drop off log to review.  Continue valsartan/hydrochlorothiazide for now.

## 2022-09-12 NOTE — Patient Instructions (Addendum)
Pass by lab to pick up stool kit.  Monitor BP at home using log provided today, then drop off in 1-2 weeks to review Bring [place] a copy of your living will Rybelsus is no longer covered by new insurance - call them to ask about covered alternatives (Ozempic or Trulicity).  Consider RSV shot through pharmacy.  Good to see you today Return as needed or in 6 months for diabetes check

## 2022-09-12 NOTE — Assessment & Plan Note (Addendum)
Advanced directive - discussed, has this at home. HCPOA - brother Briar Stpierre. Full code, would be ok life support. Ok with feeding tube as well.

## 2022-09-12 NOTE — Assessment & Plan Note (Signed)
Continue to encourage healthy diet and lifestyle choices to affect sustainable weight loss.  °

## 2022-09-12 NOTE — Assessment & Plan Note (Addendum)
Chronic, overall stable period on metformin 500mg  BID.  Rybelsus no longer covered by new insurance - I asked him to contact insurance to see if ozempic or trulicity are preferred and let me know what he finds out.

## 2022-09-12 NOTE — Assessment & Plan Note (Signed)
Stable period on advair controller inhaler daily with albuterol rescue inhaler.

## 2022-09-12 NOTE — Assessment & Plan Note (Addendum)
S/p AV repair 2021. Appreciate cardiac care.

## 2022-09-12 NOTE — Assessment & Plan Note (Addendum)
I have personally reviewed the Medicare Annual Wellness questionnaire and have noted 1. The patient's medical and social history 2. Their use of alcohol, tobacco or illicit drugs 3. Their current medications and supplements 4. The patient's functional ability including ADL's, fall risks, home safety risks and hearing or visual impairment. Cognitive function has been assessed and addressed as indicated.  5. Diet and physical activity 6. Evidence for depression or mood disorders The patients weight, height, BMI have been recorded in the chart. I have made referrals, counseling and provided education to the patient based on review of the above and I have provided the pt with a written personalized care plan for preventive services. Provider list updated.. See scanned questionairre as needed for further documentation. Reviewed preventative protocols and updated unless pt declined.  EKG deferred -as he regularly sees cardiology.

## 2022-09-12 NOTE — Assessment & Plan Note (Signed)
S/p thoracic surgery evaluation - planned monitoring for now.

## 2022-09-12 NOTE — Assessment & Plan Note (Addendum)
Chronic, stable on high potency statin - continue.  The 10-year ASCVD risk score (Arnett DK, et al., 2019) is: 27.7%   Values used to calculate the score:     Age: 67 years     Sex: Male     Is Non-Hispanic African American: No     Diabetic: Yes     Tobacco smoker: No     Systolic Blood Pressure: 154 mmHg     Is BP treated: Yes     HDL Cholesterol: 54.3 mg/dL     Total Cholesterol: 146 mg/dL

## 2022-09-12 NOTE — Assessment & Plan Note (Signed)
Chronic, stable on vit D 400IU daily.

## 2022-09-12 NOTE — Telephone Encounter (Signed)
Pt here for CPE today. I relayed message about Rybelsus. Pt verbalizes understanding and agrees to try a wkly inj.

## 2022-09-14 ENCOUNTER — Other Ambulatory Visit (INDEPENDENT_AMBULATORY_CARE_PROVIDER_SITE_OTHER): Payer: Medicare PPO

## 2022-09-14 ENCOUNTER — Telehealth: Payer: Self-pay

## 2022-09-14 DIAGNOSIS — Z1211 Encounter for screening for malignant neoplasm of colon: Secondary | ICD-10-CM | POA: Diagnosis not present

## 2022-09-14 DIAGNOSIS — R195 Other fecal abnormalities: Secondary | ICD-10-CM

## 2022-09-14 LAB — FECAL OCCULT BLOOD, IMMUNOCHEMICAL: Fecal Occult Bld: POSITIVE — AB

## 2022-09-14 NOTE — Telephone Encounter (Signed)
Plz notify iFOB returned positive therefore recommend referral to [facility] to discuss colonoscopy

## 2022-09-14 NOTE — Addendum Note (Signed)
Addended by: [name] on: 09/14/2022 05:19 PM   Modules accepted: Orders

## 2022-09-14 NOTE — Telephone Encounter (Signed)
Leah from [facility] lab called report of + ifob. Sending note to Dr Reece Agar and will teams [place] CMA.

## 2022-09-14 NOTE — Progress Notes (Signed)
Colonoscopy referral placed

## 2022-09-17 ENCOUNTER — Other Ambulatory Visit: Payer: Self-pay

## 2022-09-17 ENCOUNTER — Telehealth: Payer: Self-pay

## 2022-09-17 DIAGNOSIS — R195 Other fecal abnormalities: Secondary | ICD-10-CM

## 2022-09-17 MED ORDER — PEG 3350-KCL-NA BICARB-NACL 420 G PO SOLR: 4000.0000 mL | Freq: Once | ORAL | 0 refills | Status: AC

## 2022-09-17 NOTE — Telephone Encounter (Signed)
I asked pt to check with insurance on trulicity vs ozempic coverage and let me know.

## 2022-09-17 NOTE — Telephone Encounter (Signed)
Pt rtn call.    I relayed results and Dr. G's message.  Pt verbalizes understanding.  

## 2022-09-17 NOTE — Telephone Encounter (Signed)
Lvm asking pt to call back.  Need to relay Dr. G's message.  

## 2022-09-17 NOTE — Telephone Encounter (Signed)
   Pre-operative Risk Assessment    Patient Name: Erik Allen  DOB: Nov 03, 1955 MRN: [id number]      Request for Surgical Clearance    Procedure:   Colonoscopy (Positive Occult Stool Test)  Date of Surgery:  Clearance 10/11/22                                 Surgeon:  Dr. Karene Fry Group or Practice Name:  [facility] Phone number:  [phone] Fax number:  [phone]  Cardiac Only Type of Clearance Requested:   - Medical    Type of Anesthesia:  General   None Additional requests/questions: Elmon Kirschner   09/17/2022, 3:48 PM

## 2022-09-17 NOTE — Telephone Encounter (Signed)
Gastroenterology Pre-Procedure Review  Request Date: 10/11/22 Requesting Physician: Dr. Allegra Lai  PATIENT REVIEW QUESTIONS: The patient responded to the following health history questions as indicated:    1. Are you having any GI issues? yes (positive occult stool test) 2. Do you have a personal history of Polyps? no 3. Do you have a family history of Colon Cancer or Polyps? no 4. Diabetes Mellitus? yes (patient has been advised to stop metformin 2 days prior to colonoscopy) 5. Joint replacements in the past 12 months?no 6. Major health problems in the past 3 months?no 7. Any artificial heart valves, MVP, or defibrillator?no    MEDICATIONS & ALLERGIES:    Patient reports the following regarding taking any anticoagulation/antiplatelet therapy:   Plavix, Coumadin, Eliquis, Xarelto, Lovenox, Pradaxa, Brilinta, or Effient? no Aspirin? no  Patient confirms/reports the following medications:  Current Outpatient Medications  Medication Sig Dispense Refill   Accu-Chek FastClix Lancets MISC Use as directed to check blood sugar daily 102 each 3   ACCU-CHEK GUIDE test strip USE AS INSTRUCTED TO CHECK BLOOD SUGAR DAILY 100 strip 3   albuterol (PROVENTIL) (2.5 MG/3ML) 0.083% nebulizer solution USE 1 VIAL VIA NEBULIZER EVERY 6 HOURS AS NEEDED 150 mL 3   albuterol (VENTOLIN HFA) 108 (90 Base) MCG/ACT inhaler TAKE 2 PUFFS BY MOUTH EVERY 6 HOURS AS NEEDED FOR WHEEZE OR SHORTNESS OF BREATH 8.5 each 5   ascorbic acid (VITAMIN C) 500 MG tablet Take 1 tablet (500 mg total) by mouth daily. 30 tablet 0   aspirin 81 MG EC tablet Take 1 tablet (81 mg total) by mouth daily. 30 tablet 0   cholecalciferol (VITAMIN D3) 10 MCG (400 UNIT) TABS tablet Take 400 Units by mouth daily.     cyanocobalamin (V-R VITAMIN B-12) 500 MCG tablet Take 1 tablet (500 mcg total) by mouth daily.     finasteride (PROSCAR) 5 MG tablet Take 1 tablet (5 mg total) by mouth daily. 90 tablet 4   fluticasone furoate-vilanterol (BREO ELLIPTA)  200-25 MCG/ACT AEPB Inhale 1 puff into the lungs daily. 90 each 3   hydrocortisone 2.5 % ointment Apply topically.     metFORMIN (GLUCOPHAGE) 500 MG tablet Take 1 tablet (500 mg total) by mouth 2 (two) times daily with a meal. 180 tablet 4   montelukast (SINGULAIR) 10 MG tablet Take 1 tablet (10 mg total) by mouth at bedtime. 90 tablet 4   pantoprazole (PROTONIX) 40 MG tablet Take 1 tablet (40 mg total) by mouth at bedtime. 90 tablet 4   rosuvastatin (CRESTOR) 40 MG tablet Take 1 tablet (40 mg total) by mouth daily. 90 tablet 4   valsartan-hydrochlorothiazide (DIOVAN-HCT) 160-25 MG tablet Take 1 tablet by mouth daily. 90 tablet 4   zinc sulfate 220 (50 Zn) MG capsule Take 1 capsule (220 mg total) by mouth daily. 30 capsule 0   No current facility-administered medications for this visit.    Patient confirms/reports the following allergies:  Allergies  Allergen Reactions   Ace Inhibitors Cough   Codeine Other (See Comments)    REACTION: DIZZY / HEADACHE   Tramadol Nausea And Vomiting    No orders of the defined types were placed in this encounter.   AUTHORIZATION INFORMATION Primary Insurance: 1D#: Group #:  Secondary Insurance: 1D#: Group #:  SCHEDULE INFORMATION: Date: 10/11/22 Time: Location: [facility]

## 2022-09-18 ENCOUNTER — Telehealth: Payer: Self-pay

## 2022-09-18 NOTE — Telephone Encounter (Signed)
   Name: Erik Allen  DOB: 10-28-1955  MRN: [id number]  Primary Cardiologist: Debbe Odea, MD   Preoperative team, please contact this patient and set up a phone call appointment for further preoperative risk assessment. Please obtain consent and complete medication review. Thank you for your help.  I confirm that guidance regarding antiplatelet and oral anticoagulation therapy has been completed and, if necessary, noted below.  None requested.    Carlos Levering, NP 09/18/2022, 11:52 AM [facility]

## 2022-09-18 NOTE — Telephone Encounter (Signed)
Pt is scheduled for 08/16 at 2pm for tele preop. Med rec and consent done.

## 2022-09-18 NOTE — Telephone Encounter (Signed)
Pt is scheduled for 08/16 at 2pm for tele preop. Med rec and consent done.     Patient Consent for Virtual Visit        Erik Allen has provided verbal consent on 09/18/2022 for a virtual visit (video or telephone).   CONSENT FOR VIRTUAL VISIT FOR:  Erik Allen  By participating in this virtual visit I agree to the following:  I hereby voluntarily request, consent and authorize [facility] and its employed or contracted physicians, physician assistants, nurse practitioners or other licensed health care professionals (the Practitioner), to provide me with telemedicine health care services (the "Services") as deemed necessary by the treating Practitioner. I acknowledge and consent to receive the Services by the Practitioner via telemedicine. I understand that the telemedicine visit will involve communicating with the Practitioner through live audiovisual communication technology and the disclosure of certain medical information by electronic transmission. I acknowledge that I have been given the opportunity to request an in-person assessment or other available alternative prior to the telemedicine visit and am voluntarily participating in the telemedicine visit.  I understand that I have the right to withhold or withdraw my consent to the use of telemedicine in the course of my care at any time, without affecting my right to future care or treatment, and that the Practitioner or I may terminate the telemedicine visit at any time. I understand that I have the right to inspect all information obtained and/or recorded in the course of the telemedicine visit and may receive copies of available information for a reasonable fee.  I understand that some of the potential risks of receiving the Services via telemedicine include:  Delay or interruption in medical evaluation due to technological equipment failure or disruption; Information transmitted may not be sufficient (e.g. poor resolution  of images) to allow for appropriate medical decision making by the Practitioner; and/or  In rare instances, security protocols could fail, causing a breach of personal health information.  Furthermore, I acknowledge that it is my responsibility to provide information about my medical history, conditions and care that is complete and accurate to the best of my ability. I acknowledge that Practitioner's advice, recommendations, and/or decision may be based on factors not within their control, such as incomplete or inaccurate data provided by me or distortions of diagnostic images or specimens that may result from electronic transmissions. I understand that the practice of medicine is not an exact science and that Practitioner makes no warranties or guarantees regarding treatment outcomes. I acknowledge that a copy of this consent can be made available to me via my patient portal [facility] MyChart), or I can request a printed copy by calling the office of [facility].    I understand that my insurance will be billed for this visit.   I have read or had this consent read to me. I understand the contents of this consent, which adequately explains the benefits and risks of the Services being provided via telemedicine.  I have been provided ample opportunity to ask questions regarding this consent and the Services and have had my questions answered to my satisfaction. I give my informed consent for the services to be provided through the use of telemedicine in my medical care

## 2022-09-28 ENCOUNTER — Ambulatory Visit: Payer: Medicare PPO | Attending: Cardiology

## 2022-09-28 DIAGNOSIS — Z01810 Encounter for preprocedural cardiovascular examination: Secondary | ICD-10-CM

## 2022-09-28 NOTE — Progress Notes (Signed)
Virtual Visit via Telephone Note   Because of Erik Allen's co-morbid illnesses, he is at least at moderate risk for complications without adequate follow up.  This format is felt to be most appropriate for this patient at this time.  The patient did not have access to video technology/had technical difficulties with video requiring transitioning to audio format only (telephone).  All issues noted in this document were discussed and addressed.  No physical exam could be performed with this format.  Please refer to the patient's chart for his consent to telehealth for [facility].  Evaluation Performed:  Preoperative cardiovascular risk assessment _____________   Date:  09/28/2022   Patient ID:  Erik Allen, DOB 05-06-1955, MRN [id number] Patient Location:  Home Provider location:   Office  Primary Care Provider:  [name], MD Primary Cardiologist:  Debbe Odea, MD  Chief Complaint / Patient Profile   67 y.o. y/o male with a h/o hypertension, aortic stenosis type 2 diabetes who is pending colonoscopy and presents today for telephonic preoperative cardiovascular risk assessment.  History of Present Illness    Erik Allen is a 67 y.o. male who presents via audio/video conferencing for a telehealth visit today.  Pt was last seen in cardiology clinic on 01/09/2022 by Dr. Azucena Cecil.  At that time Erik Allen was doing well .  The patient is now pending procedure as outlined above. Since his last visit, he he remains stable from a cardiac standpoint.  Today he denies chest pain, shortness of breath, lower extremity edema, fatigue, palpitations,  hematuria, hemoptysis, diaphoresis, weakness, presyncope, syncope, orthopnea, and PND.   Past Medical History    Past Medical History:  Diagnosis Date   Aortic stenosis 06/21/2014   Echo 01/2018: Mild LVH, normal sys fxn EF 55-60%, G1DD, moderate AS with mildly dilated LA. rec yearly echocardiogram    Asthma 09/13/99   BENIGN PROSTATIC HYPERTROPHY 05/14/2003   CAP (community acquired pneumonia) 01/31/2015   COVID-19 virus infection 06/2019   Diabetes mellitus without complication (HCC)    Disorder of vocal cord 2012   h/o thrush on vocal cords per patient   GERD (gastroesophageal reflux disease) 10/13/01   ENT eval for GERD and thrush 2011   History of kidney stones    HTN (hypertension)    Hyperlipemia 10/13/97   S/P minimally invasive aortic valve replacement with bioprosthetic valve 04/29/2019   Edwards Inspiris Resilia stented bovine pericardial tissue valve (size 23 mm)   Seasonal allergic rhinitis    worse in spring   Systolic murmur    Transaminitis 2014   presumed fatty liver without [place], normal iron and viral hep panel   Vertigo    none - over 1 year   Past Surgical History:  Procedure Laterality Date   A1 pulley release  10/2010   stensosing tenosynovitis, R milddle, ring, little fingers   AORTIC VALVE REPLACEMENT N/A 04/29/2019   Procedure: MINIMALLY INVASIVE AORTIC VALVE REPLACEMENT (AVR) USING INSPIRIS VALVE SIZE ;  Surgeon: Purcell Nails, MD;  Location: [facility] OR;  Service: Open Heart Surgery;  Laterality: N/A;   COLONOSCOPY  2010   WNL, rpt due 10 yrs   CYSTECTOMY     from back   CYSTOSCOPY  09/2000   Stone retrieval   DIRECT LARYNGOSCOPY N/A 07/12/2020   DIRECT LARYNGOSCOPY WITH EXCISION OF VOCAL CORD NODULE [name], Renae Fickle, MD)   EAR CYST EXCISION Left 08/02/2016   Procedure: EXCISION MUCOID CYST LEFT MIDDLE FINGER WITH DISTAL  INTERPHALANGEAL JOINT ARHTROTOMY;  Surgeon: Cindee Salt, MD;  Location: [facility];  Service: Orthopedics;  Laterality: Left;   FOOT CAPSULE RELEASE W/ PERCUTANEOUS HEEL CORD LENGTHENING, TIBIAL TENDON TRANSFER  05/2007   bilateral, Dr. Rolland Bimler HERNIA REPAIR  06/28/2009   left, with mesh, Dr. Lemar Livings   RIGHT/LEFT HEART CATH AND CORONARY ANGIOGRAPHY Bilateral 04/06/2019   Procedure: RIGHT/LEFT HEART CATH AND  CORONARY ANGIOGRAPHY;  Surgeon: Iran Ouch, MD;  Location: [facility] INVASIVE CV LAB;  Service: Cardiovascular;  Laterality: Bilateral;   ROTATION FLAP Left 08/02/2016   Procedure: ROTATION DORSAL FLAP;  Surgeon: Cindee Salt, MD;  Location: [facility];  Service: Orthopedics;  Laterality: Left;   TEE WITHOUT CARDIOVERSION N/A 04/29/2019   Procedure: TRANSESOPHAGEAL ECHOCARDIOGRAM (TEE);  Surgeon: Purcell Nails, MD;  Location: [facility] OR;  Service: Open Heart Surgery;  Laterality: N/A;   US ECHOCARDIOGRAPHY  10/1999   TR, MR    Allergies  Allergies  Allergen Reactions   Ace Inhibitors Cough   Codeine Other (See Comments)    REACTION: DIZZY / HEADACHE   Tramadol Nausea And Vomiting    Home Medications    Prior to Admission medications   Medication Sig Start Date End Date Taking? Authorizing Provider  Accu-Chek FastClix Lancets MISC Use as directed to check blood sugar daily 09/12/22   [name], MD  ACCU-CHEK GUIDE test strip USE AS INSTRUCTED TO CHECK BLOOD SUGAR DAILY 08/13/22   [name], MD  albuterol (PROVENTIL) (2.5 MG/3ML) 0.083% nebulizer solution USE 1 VIAL VIA NEBULIZER EVERY 6 HOURS AS NEEDED 04/17/18   [name], MD  albuterol (VENTOLIN HFA) 108 (90 Base) MCG/ACT inhaler TAKE 2 PUFFS BY MOUTH EVERY 6 HOURS AS NEEDED FOR WHEEZE OR SHORTNESS OF BREATH 03/23/22   [name], MD  ascorbic acid (VITAMIN C) 500 MG tablet Take 1 tablet (500 mg total) by mouth daily. 06/26/19   Delfino Lovett, MD  aspirin 81 MG EC tablet Take 1 tablet (81 mg total) by mouth daily. 01/11/20   Debbe Odea, MD  cholecalciferol (VITAMIN D3) 10 MCG (400 UNIT) TABS tablet Take 400 Units by mouth daily.    [provider]  cyanocobalamin (V-R VITAMIN B-12) 500 MCG tablet Take 1 tablet (500 mcg total) by mouth daily. 11/06/16   [name], MD  finasteride (PROSCAR) 5 MG tablet Take 1 tablet (5 mg total) by mouth daily. 09/12/22   [name], MD   fluticasone furoate-vilanterol (BREO ELLIPTA) 200-25 MCG/ACT AEPB Inhale 1 puff into the lungs daily. 09/12/22   [name], MD  hydrocortisone 2.5 % ointment Apply topically. 11/28/21   [provider]  metFORMIN (GLUCOPHAGE) 500 MG tablet Take 1 tablet (500 mg total) by mouth 2 (two) times daily with a meal. 09/12/22   [name], MD  montelukast (SINGULAIR) 10 MG tablet Take 1 tablet (10 mg total) by mouth at bedtime. 09/12/22   [name], MD  pantoprazole (PROTONIX) 40 MG tablet Take 1 tablet (40 mg total) by mouth at bedtime. 09/12/22   [name], MD  rosuvastatin (CRESTOR) 40 MG tablet Take 1 tablet (40 mg total) by mouth daily. 09/12/22   [name], MD  valsartan-hydrochlorothiazide (DIOVAN-HCT) 160-25 MG tablet Take 1 tablet by mouth daily. 09/12/22   [name], MD  zinc sulfate 220 (50 Zn) MG capsule Take 1 capsule (220 mg total) by mouth daily. 06/26/19   Delfino Lovett, MD    Physical Exam    Vital Signs:  Erik Allen does not have vital signs available for review today.  Given telephonic nature of communication, physical exam is limited. AAOx3. NAD. Normal affect.  Speech and respirations are unlabored.  Accessory Clinical Findings    None  Assessment & Plan    1.  Preoperative Cardiovascular Risk Assessment:-Colonoscopy, Dr. Christeen Douglas gastroenterology, [phone]     Primary Cardiologist: Debbe Odea, MD  Chart reviewed as part of pre-operative protocol coverage. Given past medical history and time since last visit, based on ACC/AHA guidelines, Erik Allen would be at acceptable risk for the planned procedure without further cardiovascular testing.   Patient was advised that if he develops new symptoms prior to surgery to contact our office to arrange a follow-up appointment.  He verbalized understanding.  I will route this recommendation to the requesting party via Epic fax function and remove from  pre-op pool.    Time:   Today, I have spent 5 minutes with the patient with telehealth technology discussing medical history, symptoms, and management plan.  Prior to his phone evaluation I spent greater than 10 minutes reviewing his past medical history and cardiac medications.   Ronney Asters, NP  09/28/2022, 6:57 AM

## 2022-10-10 ENCOUNTER — Encounter: Payer: Self-pay | Admitting: Gastroenterology

## 2022-10-11 ENCOUNTER — Encounter
Admission: RE | Disposition: A | Discharge status: Home / Self Care | Payer: Self-pay | Source: Home / Self Care | Attending: Gastroenterology

## 2022-10-11 ENCOUNTER — Ambulatory Visit
Admission: RE | Admit: 2022-10-11 | Discharge: 2022-10-11 | Disposition: A | Discharge status: Home / Self Care | Payer: Medicare PPO | Attending: Gastroenterology | Admitting: Gastroenterology

## 2022-10-11 ENCOUNTER — Ambulatory Visit: Payer: Medicare PPO | Admitting: Certified Registered"

## 2022-10-11 ENCOUNTER — Telehealth: Payer: Self-pay

## 2022-10-11 DIAGNOSIS — D123 Benign neoplasm of transverse colon: Secondary | ICD-10-CM | POA: Insufficient documentation

## 2022-10-11 DIAGNOSIS — Q438 Other specified congenital malformations of intestine: Secondary | ICD-10-CM | POA: Insufficient documentation

## 2022-10-11 DIAGNOSIS — E119 Type 2 diabetes mellitus without complications: Secondary | ICD-10-CM | POA: Insufficient documentation

## 2022-10-11 DIAGNOSIS — K635 Polyp of colon: Secondary | ICD-10-CM

## 2022-10-11 DIAGNOSIS — D122 Benign neoplasm of ascending colon: Secondary | ICD-10-CM | POA: Insufficient documentation

## 2022-10-11 DIAGNOSIS — K5730 Diverticulosis of large intestine without perforation or abscess without bleeding: Secondary | ICD-10-CM | POA: Insufficient documentation

## 2022-10-11 DIAGNOSIS — I10 Essential (primary) hypertension: Secondary | ICD-10-CM | POA: Diagnosis not present

## 2022-10-11 DIAGNOSIS — Z1211 Encounter for screening for malignant neoplasm of colon: Secondary | ICD-10-CM | POA: Insufficient documentation

## 2022-10-11 DIAGNOSIS — R195 Other fecal abnormalities: Secondary | ICD-10-CM

## 2022-10-11 DIAGNOSIS — D124 Benign neoplasm of descending colon: Secondary | ICD-10-CM | POA: Diagnosis not present

## 2022-10-11 DIAGNOSIS — Z953 Presence of xenogenic heart valve: Secondary | ICD-10-CM | POA: Insufficient documentation

## 2022-10-11 HISTORY — PX: HEMOSTASIS CLIP PLACEMENT: SHX6857

## 2022-10-11 HISTORY — PX: POLYPECTOMY: SHX5525

## 2022-10-11 HISTORY — PX: COLONOSCOPY WITH PROPOFOL: SHX5780

## 2022-10-11 LAB — GLUCOSE, CAPILLARY: Glucose-Capillary: 155 mg/dL — ABNORMAL HIGH (ref 70–99)

## 2022-10-11 SURGERY — COLONOSCOPY WITH PROPOFOL
Anesthesia: General

## 2022-10-11 MED ORDER — PROPOFOL 500 MG/50ML IV EMUL
INTRAVENOUS | Status: DC | PRN
  Administered 2022-10-11: 150 ug/kg/min via INTRAVENOUS

## 2022-10-11 MED ORDER — STERILE WATER FOR IRRIGATION IR SOLN
Status: DC | PRN
  Administered 2022-10-11: 60 mL

## 2022-10-11 MED ORDER — PROPOFOL 10 MG/ML IV BOLUS
INTRAVENOUS | Status: DC | PRN
  Administered 2022-10-11: 70 mg via INTRAVENOUS
  Administered 2022-10-11: 10 mg via INTRAVENOUS

## 2022-10-11 MED ORDER — LIDOCAINE HCL (CARDIAC) PF 100 MG/5ML IV SOSY
PREFILLED_SYRINGE | INTRAVENOUS | Status: DC | PRN
  Administered 2022-10-11: 50 mg via INTRAVENOUS

## 2022-10-11 MED ORDER — SODIUM CHLORIDE 0.9 % IV SOLN: INTRAVENOUS | Status: DC

## 2022-10-11 NOTE — Op Note (Signed)
[facility] Gastroenterology Patient Name: Erik Allen Procedure Date: 10/11/2022 9:23 AM MRN: [id number] Account #: [id number] Date of Birth: 09/21/1955 Admit Type: Outpatient Age: 67 Room: [facility] ENDO ROOM 3 Gender: Male Note Status: Finalized Instrument Name: Colonoscope [id number] Procedure:             Colonoscopy Indications:           Positive fecal immunochemical test Providers:             Toney Reil MD, MD Referring MD:          [name] (Referring MD) Medicines:             General Anesthesia Complications:         No immediate complications. Estimated blood loss: None. Procedure:             Pre-Anesthesia Assessment:                        - Prior to the procedure, a History and Physical was                         performed, and patient medications and allergies were                         reviewed. The patient is competent. The risks and                         benefits of the procedure and the sedation options and                         risks were discussed with the patient. All questions                         were answered and informed consent was obtained.                         Patient identification and proposed procedure were                         verified by the physician, the nurse, the                         anesthesiologist, the anesthetist and the technician                         in the pre-procedure area in the procedure room in the                         endoscopy suite. Mental Status Examination: alert and                         oriented. Airway Examination: normal oropharyngeal                         airway and neck mobility. Respiratory Examination:                         clear to auscultation. CV Examination: normal.  Prophylactic Antibiotics: The patient does not require                         prophylactic antibiotics. Prior Anticoagulants: The                         patient has  taken no anticoagulant or antiplatelet                         agents. ASA Grade Assessment: III - A patient with                         severe systemic disease. After reviewing the risks and                         benefits, the patient was deemed in satisfactory                         condition to undergo the procedure. The anesthesia                         plan was to use general anesthesia. Immediately prior                         to administration of medications, the patient was                         re-assessed for adequacy to receive sedatives. The                         heart rate, respiratory rate, oxygen saturations,                         blood pressure, adequacy of pulmonary ventilation, and                         response to care were monitored throughout the                         procedure. The physical status of the patient was                         re-assessed after the procedure.                        After obtaining informed consent, the colonoscope was                         passed under direct vision. Throughout the procedure,                         the patient's blood pressure, pulse, and oxygen                         saturations were monitored continuously. The                         Colonoscope was introduced through the anus and  advanced to the the cecum, identified by appendiceal                         orifice and ileocecal valve. The colonoscopy was                         performed with moderate difficulty due to significant                         looping and the patient's body habitus. Successful                         completion of the procedure was aided by applying                         abdominal pressure. The patient tolerated the                         procedure well. The quality of the bowel preparation                         was adequate to identify polyps greater than 5 mm in                         size.  The ileocecal valve, appendiceal orifice, and                         rectum were photographed. Findings:      The perianal and digital rectal examinations were normal. Pertinent       negatives include normal sphincter tone and no palpable rectal lesions.      A diminutive polyp was found in the ascending colon. The polyp was       sessile. The polyp was removed with a jumbo cold forceps. Resection and       retrieval were complete. Estimated blood loss was minimal. To prevent       bleeding after the polypectomy, one hemostatic clip was successfully       placed (MR safe). Clip manufacturer: [employer]. There was no       bleeding at the end of the procedure.      Four sessile polyps were found in the transverse colon. The polyps were       6 to 7 mm in size. These polyps were removed with a cold snare.       Resection and retrieval were complete.      Four sessile polyps were found in the descending colon. The polyps were       5 to 6 mm in size. These polyps were removed with a cold snare.       Resection and retrieval were complete.      Many diverticula were found in the sigmoid colon and descending colon.      The retroflexed view of the distal rectum and anal verge was normal and       showed no anal or rectal abnormalities. Impression:            - One diminutive polyp in the ascending colon, removed  with a jumbo cold forceps. Resected and retrieved.                         Clip manufacturer: [employer]. Clip (MR safe)                         was placed.                        - Four 6 to 7 mm polyps in the transverse colon,                         removed with a cold snare. Resected and retrieved.                        - Four 5 to 6 mm polyps in the descending colon,                         removed with a cold snare. Resected and retrieved.                        - Diverticulosis in the sigmoid colon and in the                          descending colon.                        - The distal rectum and anal verge are normal on                         retroflexion view. Recommendation:        - Discharge patient to home (with escort).                        - Resume previous diet today.                        - Continue present medications.                        - Await pathology results.                        - Repeat colonoscopy in 1 year with 2 day prep for                         surveillance of multiple polyps. Procedure Code(s):     --- Professional ---                        [phone], Colonoscopy, flexible; with removal of                         tumor(s), polyp(s), or other lesion(s) by snare                         technique                        [id number], 59, Colonoscopy, flexible; with biopsy, single  or multiple Diagnosis Code(s):     --- Professional ---                        D12.2, Benign neoplasm of ascending colon                        D12.3, Benign neoplasm of transverse colon (hepatic                         flexure or splenic flexure)                        D12.4, Benign neoplasm of descending colon                        K57.30, Diverticulosis of large intestine without                         perforation or abscess without bleeding                        R19.5, Other fecal abnormalities CPT copyright 2022 American Medical Association. All rights reserved. The codes documented in this report are preliminary and upon coder review may  be revised to meet current compliance requirements. Dr. Libby Maw Toney Reil MD, MD 10/11/2022 10:01:48 AM This report has been signed electronically. Number of Addenda: 0 Note Initiated On: 10/11/2022 9:23 AM Scope Withdrawal Time: 0 hours 17 minutes 36 seconds  Total Procedure Duration: 0 hours 20 minutes 0 seconds  Estimated Blood Loss:  Estimated blood loss: none.      [facility]

## 2022-10-11 NOTE — H&P (Signed)
Arlyss Repress, MD [address]  [address]  [place], [place] [address]  Main: [phone]  Fax: [phone] Pager: [phone]  Primary Care Physician:  [name], MD Primary Gastroenterologist:  Dr. Arlyss Repress  Pre-Procedure History & Physical: HPI:  Erik Allen is a 67 y.o. male is here for an colonoscopy.   Past Medical History:  Diagnosis Date   Aortic stenosis 06/21/2014   Echo 01/2018: Mild LVH, normal sys fxn EF 55-60%, G1DD, moderate AS with mildly dilated LA. rec yearly echocardiogram   Asthma 09/13/99   BENIGN PROSTATIC HYPERTROPHY 05/14/2003   CAP (community acquired pneumonia) 01/31/2015   COVID-19 virus infection 06/2019   Diabetes mellitus without complication (HCC)    Disorder of vocal cord 2012   h/o thrush on vocal cords per patient   GERD (gastroesophageal reflux disease) 10/13/01   ENT eval for GERD and thrush 2011   History of kidney stones    HTN (hypertension)    Hyperlipemia 10/13/97   S/P minimally invasive aortic valve replacement with bioprosthetic valve 04/29/2019   Edwards Inspiris Resilia stented bovine pericardial tissue valve (size 23 mm)   Seasonal allergic rhinitis    worse in spring   Systolic murmur    Transaminitis 2014   presumed fatty liver without [place], normal iron and viral hep panel   Vertigo    none - over 1 year    Past Surgical History:  Procedure Laterality Date   A1 pulley release  10/2010   stensosing tenosynovitis, R milddle, ring, little fingers   AORTIC VALVE REPLACEMENT N/A 04/29/2019   Procedure: MINIMALLY INVASIVE AORTIC VALVE REPLACEMENT (AVR) USING INSPIRIS VALVE SIZE ;  Surgeon: Purcell Nails, MD;  Location: [facility] OR;  Service: Open Heart Surgery;  Laterality: N/A;   COLONOSCOPY  2010   WNL, rpt due 10 yrs   CYSTECTOMY     from back   CYSTOSCOPY  09/2000   Stone retrieval   DIRECT LARYNGOSCOPY N/A 07/12/2020   DIRECT LARYNGOSCOPY WITH EXCISION OF VOCAL CORD NODULE [name], Renae Fickle, MD)    EAR CYST EXCISION Left 08/02/2016   Procedure: EXCISION MUCOID CYST LEFT MIDDLE FINGER WITH DISTAL INTERPHALANGEAL JOINT ARHTROTOMY;  Surgeon: Cindee Salt, MD;  Location: [facility];  Service: Orthopedics;  Laterality: Left;   FOOT CAPSULE RELEASE W/ PERCUTANEOUS HEEL CORD LENGTHENING, TIBIAL TENDON TRANSFER  05/2007   bilateral, Dr. Rolland Bimler HERNIA REPAIR  06/28/2009   left, with mesh, Dr. Lemar Livings   RIGHT/LEFT HEART CATH AND CORONARY ANGIOGRAPHY Bilateral 04/06/2019   Procedure: RIGHT/LEFT HEART CATH AND CORONARY ANGIOGRAPHY;  Surgeon: Iran Ouch, MD;  Location: [facility];  Service: Cardiovascular;  Laterality: Bilateral;   ROTATION FLAP Left 08/02/2016   Procedure: ROTATION DORSAL FLAP;  Surgeon: Cindee Salt, MD;  Location: [facility];  Service: Orthopedics;  Laterality: Left;   TEE WITHOUT CARDIOVERSION N/A 04/29/2019   Procedure: TRANSESOPHAGEAL ECHOCARDIOGRAM (TEE);  Surgeon: Purcell Nails, MD;  Location: [facility] OR;  Service: Open Heart Surgery;  Laterality: N/A;   US ECHOCARDIOGRAPHY  10/1999   TR, MR    Prior to Admission medications   Medication Sig Start Date End Date Taking? Authorizing Provider  ascorbic acid (VITAMIN C) 500 MG tablet Take 1 tablet (500 mg total) by mouth daily. 06/26/19  Yes Delfino Lovett, MD  aspirin 81 MG EC tablet Take 1 tablet (81 mg total) by mouth daily. 01/11/20  Yes Debbe Odea, MD  cholecalciferol (VITAMIN D3)  10 MCG (400 UNIT) TABS tablet Take 400 Units by mouth daily.   Yes [provider]  cyanocobalamin (V-R VITAMIN B-12) 500 MCG tablet Take 1 tablet (500 mcg total) by mouth daily. 11/06/16  Yes [name], MD  finasteride (PROSCAR) 5 MG tablet Take 1 tablet (5 mg total) by mouth daily. 09/12/22  Yes [name], MD  fluticasone furoate-vilanterol (BREO ELLIPTA) 200-25 MCG/ACT AEPB Inhale 1 puff into the lungs daily. 09/12/22  Yes [name], MD  montelukast  (SINGULAIR) 10 MG tablet Take 1 tablet (10 mg total) by mouth at bedtime. 09/12/22  Yes [name], MD  pantoprazole (PROTONIX) 40 MG tablet Take 1 tablet (40 mg total) by mouth at bedtime. 09/12/22  Yes [name], MD  rosuvastatin (CRESTOR) 40 MG tablet Take 1 tablet (40 mg total) by mouth daily. 09/12/22  Yes [name], MD  valsartan-hydrochlorothiazide (DIOVAN-HCT) 160-25 MG tablet Take 1 tablet by mouth daily. 09/12/22  Yes [name], MD  zinc sulfate 220 (50 Zn) MG capsule Take 1 capsule (220 mg total) by mouth daily. 06/26/19  Yes Delfino Lovett, MD  Accu-Chek FastClix Lancets MISC Use as directed to check blood sugar daily 09/12/22   [name], MD  ACCU-CHEK GUIDE test strip USE AS INSTRUCTED TO CHECK BLOOD SUGAR DAILY 08/13/22   [name], MD  albuterol (PROVENTIL) (2.5 MG/3ML) 0.083% nebulizer solution USE 1 VIAL VIA NEBULIZER EVERY 6 HOURS AS NEEDED 04/17/18   [name], MD  albuterol (VENTOLIN HFA) 108 (90 Base) MCG/ACT inhaler TAKE 2 PUFFS BY MOUTH EVERY 6 HOURS AS NEEDED FOR WHEEZE OR SHORTNESS OF BREATH 03/23/22   [name], MD  hydrocortisone 2.5 % ointment Apply topically. 11/28/21   [provider]  metFORMIN (GLUCOPHAGE) 500 MG tablet Take 1 tablet (500 mg total) by mouth 2 (two) times daily with a meal. 09/12/22   [name], MD    Allergies as of 09/17/2022 - Review Complete 09/17/2022  Allergen Reaction Noted   Ace inhibitors Cough 02/04/2012   Codeine Other (See Comments) 08/27/2006   Tramadol Nausea And Vomiting 08/27/2006    Family History  Problem Relation Age of Onset   Hypertension Mother    Cancer Father 55       Lymphoma, recurrent   Hypertension Father    Hypertension Brother     Social History   Socioeconomic History   Marital status: Single    Spouse name: Not on file   Number of children: Not on file   Years of education: Not on file   Highest education level: Not on file  Occupational  History   Occupation: TEAM LEADER    Employer: [employer]    Comment: Sheet metal fabrication  Tobacco Use   Smoking status: Never   Smokeless tobacco: Never  Vaping Use   Vaping status: Never Used  Substance and Sexual Activity   Alcohol use: Yes    Alcohol/week: 1.0 - 2.0 standard drink of alcohol    Types: 1 - 2 Standard drinks or equivalent per week    Comment: occassionally   Drug use: No   Sexual activity: Yes  Other Topics Concern   Not on file  Social History Narrative   Caffeine: a couple mountain dews/day, occasional coffee   Lives alone, no pets   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, 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    Activity: no regular exercise, trying to restart routine   Diet: good water, vegetables daily, some fish/meat   Social  Determinants of Health   Financial Resource Strain: Not on file  Food Insecurity: Not on file  Transportation Needs: Not on file  Physical Activity: Not on file  Stress: Not on file  Social Connections: Not on file  Intimate Partner Violence: Not on file    Review of Systems: See HPI, otherwise negative ROS  Physical Exam: BP (!) 145/82   Pulse 89   Temp (!) 97.3 F (36.3 C) (Temporal)   Resp 18   Ht 5' 7.5" (1.715 m)   Wt 100.2 kg   SpO2 98%   BMI 34.10 kg/m  General:   Alert,  pleasant and cooperative in NAD Head:  Normocephalic and atraumatic. Neck:  Supple; no masses or thyromegaly. Lungs:  Clear throughout to auscultation.    Heart:  Regular rate and rhythm. Abdomen:  Soft, nontender and nondistended. Normal bowel sounds, without guarding, and without rebound.   Neurologic:  Alert and  oriented x4;  grossly normal neurologically.  Impression/Plan: Erik Allen is here for an colonoscopy to be performed for fecal occult blood test positive  Risks, benefits, limitations, and alternatives regarding  colonoscopy have been reviewed with the patient.  Questions have been answered.  All parties  agreeable.   Lannette Donath, MD  10/11/2022, 8:52 AM

## 2022-10-11 NOTE — Anesthesia Procedure Notes (Signed)
Procedure Name: MAC Date/Time: 10/11/2022 9:31 AM  Performed by: Cheral Bay, CRNAPre-anesthesia Checklist: Patient identified, Emergency Drugs available, Suction available, Patient being monitored and Timeout performed Patient Re-evaluated:Patient Re-evaluated prior to induction Oxygen Delivery Method: Nasal cannula Induction Type: IV induction Placement Confirmation: positive ETCO2 and CO2 detector

## 2022-10-11 NOTE — Anesthesia Postprocedure Evaluation (Signed)
Anesthesia Post Note  Patient: Erik Allen  Procedure(s) Performed: COLONOSCOPY WITH PROPOFOL HEMOSTASIS CLIP PLACEMENT POLYPECTOMY  Patient location during evaluation: Endoscopy Anesthesia Type: General Level of consciousness: awake and alert Pain management: pain level controlled Vital Signs Assessment: post-procedure vital signs reviewed and stable Respiratory status: spontaneous breathing, nonlabored ventilation, respiratory function stable and patient connected to nasal cannula oxygen Cardiovascular status: blood pressure returned to baseline and stable Postop Assessment: no apparent nausea or vomiting Anesthetic complications: no  No notable events documented.   Last Vitals:  Vitals:   10/11/22 1012 10/11/22 1022  BP: 124/86 124/82  Pulse: 87 79  Resp: 18 (!) 23  Temp:    SpO2: 99% 99%    Last Pain:  Vitals:   10/11/22 1022  TempSrc:   PainSc: 0-No pain                 Stephanie Coup

## 2022-10-11 NOTE — Anesthesia Preprocedure Evaluation (Signed)
Anesthesia Evaluation  Patient identified by MRN, date of birth, ID band Patient awake    Reviewed: Allergy & Precautions, H&P , NPO status , Patient's Chart, lab work & pertinent test results  Airway Mallampati: I  TM Distance: >3 FB Neck ROM: full    Dental no notable dental hx. (+) Chipped   Pulmonary neg pulmonary ROS, asthma    Pulmonary exam normal        Cardiovascular hypertension, negative cardio ROS Normal cardiovascular exam+ Valvular Problems/Murmurs  Rhythm:regular Rate:Normal + Systolic murmurs Aortic valve replacement last year- denies CP/SOB.  Follow up echos have been normal.   Neuro/Psych negative neurological ROS  negative psych ROS   GI/Hepatic negative GI ROS, Neg liver ROS,  Medicated,,  Endo/Other  negative endocrine ROSdiabetes, Well Controlled, Type 2    Renal/GU negative Renal ROS  negative genitourinary   Musculoskeletal   Abdominal   Peds  Hematology negative hematology ROS (+)   Anesthesia Other Findings Patient with hx of open heart surgery for aortic valve replacement.   Past Medical History: 06/21/2014: Aortic stenosis     Comment:  Echo 01/2018: Mild LVH, normal sys fxn EF 55-60%, G1DD,               moderate AS with mildly dilated LA. 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     Comment:  h/o thrush on vocal cords per patient 10/13/01: GERD (gastroesophageal reflux disease)     Comment:  ENT eval for GERD and thrush 2011 No date: History of kidney stones No date: HTN (hypertension) 10/13/97: Hyperlipemia 04/29/2019: S/P minimally invasive aortic valve replacement with  bioprosthetic valve     Comment:  Edwards Inspiris Resilia stented bovine pericardial                tissue valve (size 23 mm) No date: Seasonal allergic rhinitis     Comment:  worse in spring No date: Systolic murmur 2014: Transaminitis     Comment:  presumed fatty liver without [place], normal iron and viral               hep panel No date: Vertigo     Comment:  none - over 1 year  Past Surgical History: 10/2010: A1 pulley release     Comment:  stensosing tenosynovitis, R milddle, ring, little               fingers 04/29/2019: AORTIC VALVE REPLACEMENT; N/A     Comment:  Procedure: MINIMALLY INVASIVE AORTIC VALVE REPLACEMENT               (AVR) USING INSPIRIS VALVE SIZE ;  Surgeon: Purcell Nails, MD;  Location: [facility] OR;  Service: Open Heart               Surgery;  Laterality: N/A; 2010: COLONOSCOPY     Comment:  WNL, rpt due 10 yrs No date: CYSTECTOMY     Comment:  from back 09/2000: CYSTOSCOPY     Comment:  Stone retrieval 07/12/2020: DIRECT LARYNGOSCOPY; N/A     Comment:  DIRECT LARYNGOSCOPY WITH EXCISION OF VOCAL CORD NODULE               (  Geanie Logan, MD) 08/02/2016: EAR CYST EXCISION; Left     Comment:  Procedure: EXCISION MUCOID CYST LEFT MIDDLE FINGER WITH               DISTAL INTERPHALANGEAL JOINT ARHTROTOMY;  Surgeon: Cindee Salt, MD;  Location: [facility];  Service:              Orthopedics;  Laterality: Left; 05/2007: FOOT CAPSULE RELEASE W/ PERCUTANEOUS HEEL CORD LENGTHENING,  TIBIAL TENDON TRANSFER     Comment:  bilateral, Dr. Al Corpus 06/28/2009: INGUINAL HERNIA REPAIR     Comment:  left, with mesh, Dr. Lemar Livings 04/06/2019: RIGHT/LEFT HEART CATH AND CORONARY ANGIOGRAPHY; Bilateral     Comment:  Procedure: RIGHT/LEFT HEART CATH AND CORONARY               ANGIOGRAPHY;  Surgeon: Iran Ouch, MD;  Location:               [facility];  Service: Cardiovascular;                Laterality: Bilateral; 08/02/2016: ROTATION FLAP; Left     Comment:  Procedure: ROTATION DORSAL FLAP;  Surgeon: Cindee Salt,                MD;  Location: [facility];  Service:               Orthopedics;  Laterality: Left; 04/29/2019: TEE WITHOUT CARDIOVERSION; N/A     Comment:  Procedure: TRANSESOPHAGEAL ECHOCARDIOGRAM (TEE);                Surgeon: Purcell Nails, MD;  Location: [facility] OR;                Service: Open Heart Surgery;  Laterality: N/A; 10/1999: US ECHOCARDIOGRAPHY     Comment:  TR, MR  BMI    Body Mass Index: 34.10 kg/m      Reproductive/Obstetrics negative OB ROS                             Anesthesia Physical Anesthesia Plan  ASA: 3  Anesthesia Plan: General   Post-op Pain Management: Minimal or no pain anticipated   Induction: Intravenous  PONV Risk Score and Plan: 3 and Propofol infusion, TIVA and Ondansetron  Airway Management Planned: Nasal Cannula  Additional Equipment: None  Intra-op Plan:   Post-operative Plan:   Informed Consent: I have reviewed the patients History and Physical, chart, labs and discussed the procedure including the risks, benefits and alternatives for the proposed anesthesia with the patient or authorized representative who has indicated his/her understanding and acceptance.     Dental advisory given  Plan Discussed with: CRNA and Surgeon  Anesthesia Plan Comments: (Discussed risks of anesthesia with patient, including possibility of difficulty with spontaneous ventilation under anesthesia necessitating airway intervention, PONV, and rare risks such as cardiac or respiratory or neurological events, and allergic reactions. Discussed the role of CRNA in patient's perioperative care. Patient understands.)       Anesthesia Quick Evaluation

## 2022-10-11 NOTE — Telephone Encounter (Signed)
Clearance was granted from Dr. Myriam Forehand for patients colonoscopy today.  Clearance was received by fax on 09/28/22.  Noted late due to being sick out of office.  Cardiac clearance noted noted on 08/16 Telephone Preop Cardiovascular exam.  Thanks,  Marcelino Duster, CMA

## 2022-10-11 NOTE — Transfer of Care (Signed)
Immediate Anesthesia Transfer of Care Note  Patient: OLAMIPOSI TOPP  Procedure(s) Performed: COLONOSCOPY WITH PROPOFOL HEMOSTASIS CLIP PLACEMENT POLYPECTOMY  Patient Location: PACU and Endoscopy Unit  Anesthesia Type:General  Level of Consciousness: awake and drowsy  Airway & Oxygen Therapy: Patient Spontanous Breathing  Post-op Assessment: Report given to RN and Post -op Vital signs reviewed and stable  Post vital signs: Reviewed and stable  Last Vitals:  Vitals Value Taken Time  BP    Temp    Pulse 95 10/11/22 1002  Resp 20 10/11/22 1002  SpO2 96 % 10/11/22 1002  Vitals shown include unfiled device data.  Last Pain:  Vitals:   10/11/22 0802  TempSrc: Temporal  PainSc: 0-No pain         Complications: No notable events documented.

## 2022-10-12 ENCOUNTER — Encounter: Payer: Self-pay | Admitting: Gastroenterology

## 2022-10-15 ENCOUNTER — Encounter: Payer: Self-pay | Admitting: Family Medicine

## 2022-10-16 ENCOUNTER — Encounter: Payer: Self-pay | Admitting: Gastroenterology

## 2022-10-16 ENCOUNTER — Encounter: Payer: Self-pay | Admitting: Family Medicine

## 2022-10-19 ENCOUNTER — Telehealth: Payer: Self-pay | Admitting: Family Medicine

## 2022-10-19 NOTE — Telephone Encounter (Addendum)
Log reviewed. BP remaining well controlled at home - overall great control, no BP greater than 138/82.  Continue valsartan 150/hydrochlorothiazide 25mg  daily. No changes indicated.

## 2022-10-19 NOTE — Telephone Encounter (Signed)
 Placed log in Dr. Timoteo Expose box.

## 2022-10-19 NOTE — Telephone Encounter (Signed)
Pt brought by BP log for Dr. Reece Agar to review. Log is in Dr. Timoteo Expose folder. Call back # [phone]

## 2022-10-19 NOTE — Telephone Encounter (Signed)
Spoke with pt relaying Dr. G's message. Pt verbalizes understanding.  

## 2022-10-23 LAB — HM DIABETES EYE EXAM

## 2022-12-07 ENCOUNTER — Other Ambulatory Visit: Payer: Self-pay | Admitting: Family Medicine

## 2022-12-07 DIAGNOSIS — E1169 Type 2 diabetes mellitus with other specified complication: Secondary | ICD-10-CM

## 2022-12-11 ENCOUNTER — Other Ambulatory Visit: Payer: Self-pay | Admitting: Family Medicine

## 2022-12-11 DIAGNOSIS — E1169 Type 2 diabetes mellitus with other specified complication: Secondary | ICD-10-CM

## 2022-12-16 ENCOUNTER — Other Ambulatory Visit: Payer: Self-pay | Admitting: Family Medicine

## 2022-12-16 DIAGNOSIS — E1169 Type 2 diabetes mellitus with other specified complication: Secondary | ICD-10-CM

## 2022-12-17 ENCOUNTER — Encounter: Payer: Self-pay | Admitting: Family Medicine

## 2022-12-17 ENCOUNTER — Telehealth: Payer: Self-pay | Admitting: Family Medicine

## 2022-12-17 MED ORDER — RYBELSUS 3 MG PO TABS: 3.0000 mg | ORAL_TABLET | Freq: Every day | ORAL | 0 refills | Status: DC

## 2022-12-17 MED ORDER — RYBELSUS 7 MG PO TABS: 7.0000 mg | ORAL_TABLET | Freq: Every day | ORAL | 6 refills | Status: DC

## 2022-12-17 NOTE — Addendum Note (Signed)
Addended by: [name] on: 12/17/2022 05:12 PM   Modules accepted: Orders

## 2022-12-17 NOTE — Addendum Note (Signed)
Addended by: [name] on: 12/17/2022 02:13 PM   Modules accepted: Orders

## 2022-12-17 NOTE — Telephone Encounter (Signed)
Pt called in and would like a call back regarding his Rybelsus 7mg   pt stated he don't have any more refills on the medicine .

## 2022-12-17 NOTE — Telephone Encounter (Signed)
Rybelsus 7mg  dose sent to pharmacy

## 2022-12-17 NOTE — Telephone Encounter (Signed)
Called patient states that insurance will now cover. Last note we had it was not covered. He would like to start back on it. I have also set up for nurse visit for flu shot tomorrow. Sending for notes on eye exam now.

## 2022-12-17 NOTE — Telephone Encounter (Signed)
error 

## 2022-12-17 NOTE — Telephone Encounter (Signed)
Spoke with pt relaying Dr Timoteo Expose message. Pt verbalizes understanding but states he's been taking Rybelsus 7 mg for a couple of yrs without an issue. Requests refill for just 7 mg be sent in.

## 2022-12-17 NOTE — Telephone Encounter (Addendum)
I've sent Rybelsus 3mg  to take daily for the first month and if tolerated well then I've sent 7mg  dose to start daily after first month.  Take on an empty stomach in the morning, then eat something 30-60 min after taking pill.   Lab Results  Component Value Date   HGBA1C 7.0 (H) 09/05/2022

## 2022-12-18 ENCOUNTER — Ambulatory Visit (INDEPENDENT_AMBULATORY_CARE_PROVIDER_SITE_OTHER): Payer: Medicare PPO

## 2022-12-18 DIAGNOSIS — Z23 Encounter for immunization: Secondary | ICD-10-CM

## 2023-03-04 ENCOUNTER — Other Ambulatory Visit: Payer: Self-pay | Admitting: Family Medicine

## 2023-03-15 ENCOUNTER — Ambulatory Visit (INDEPENDENT_AMBULATORY_CARE_PROVIDER_SITE_OTHER): Payer: Medicare PPO | Admitting: Family Medicine

## 2023-03-15 ENCOUNTER — Encounter: Payer: Self-pay | Admitting: Family Medicine

## 2023-03-15 VITALS — BP 138/84 | HR 92 | Temp 98.2°F | Ht 67.5 in | Wt 229.4 lb

## 2023-03-15 DIAGNOSIS — L821 Other seborrheic keratosis: Secondary | ICD-10-CM | POA: Diagnosis not present

## 2023-03-15 DIAGNOSIS — D2262 Melanocytic nevi of left upper limb, including shoulder: Secondary | ICD-10-CM | POA: Diagnosis not present

## 2023-03-15 DIAGNOSIS — Z2989 Encounter for other specified prophylactic measures: Secondary | ICD-10-CM | POA: Diagnosis not present

## 2023-03-15 DIAGNOSIS — D2261 Melanocytic nevi of right upper limb, including shoulder: Secondary | ICD-10-CM | POA: Diagnosis not present

## 2023-03-15 DIAGNOSIS — E1169 Type 2 diabetes mellitus with other specified complication: Secondary | ICD-10-CM

## 2023-03-15 DIAGNOSIS — L570 Actinic keratosis: Secondary | ICD-10-CM | POA: Diagnosis not present

## 2023-03-15 DIAGNOSIS — R490 Dysphonia: Secondary | ICD-10-CM | POA: Diagnosis not present

## 2023-03-15 DIAGNOSIS — Z7984 Long term (current) use of oral hypoglycemic drugs: Secondary | ICD-10-CM | POA: Diagnosis not present

## 2023-03-15 DIAGNOSIS — D2272 Melanocytic nevi of left lower limb, including hip: Secondary | ICD-10-CM | POA: Diagnosis not present

## 2023-03-15 DIAGNOSIS — J4520 Mild intermittent asthma, uncomplicated: Secondary | ICD-10-CM | POA: Diagnosis not present

## 2023-03-15 DIAGNOSIS — D2271 Melanocytic nevi of right lower limb, including hip: Secondary | ICD-10-CM | POA: Diagnosis not present

## 2023-03-15 DIAGNOSIS — Z953 Presence of xenogenic heart valve: Secondary | ICD-10-CM | POA: Diagnosis not present

## 2023-03-15 DIAGNOSIS — D225 Melanocytic nevi of trunk: Secondary | ICD-10-CM | POA: Diagnosis not present

## 2023-03-15 LAB — POCT GLYCOSYLATED HEMOGLOBIN (HGB A1C): Hemoglobin A1C: 7 % — AB (ref 4.0–5.6)

## 2023-03-15 MED ORDER — RYBELSUS 7 MG PO TABS: 7.0000 mg | ORAL_TABLET | Freq: Every day | ORAL | 6 refills | Status: DC

## 2023-03-15 NOTE — Patient Instructions (Addendum)
Try changing Rybelsus to 30 min before breakfast with 4 oz of water.  We will request records of latest eye exam.  Good to see you today Return in 6 months for medicare wellness/physical.  Call cardiology for a follow up appointment

## 2023-03-15 NOTE — Assessment & Plan Note (Signed)
Weight gain noted. Continue to encourage healthy diet and lifestyle choices to affect sustainable weight loss. He declines higher Rybelsus dose.  Obesity associated with diabetes

## 2023-03-15 NOTE — Assessment & Plan Note (Addendum)
Chronic, stable period on metformin and Rybelsus.  Desires to continue 7mg  dose. Discussed optimal Rybelsus dosing Will request latest eye exam.

## 2023-03-15 NOTE — Progress Notes (Signed)
Ph: [phone] Fax: [phone]   Patient ID: Erik Allen, male    DOB: 06/20/1955, 68 y.o.   MRN: [id number]  This visit was conducted in person.  BP 138/84   Pulse 92   Temp 98.2 F (36.8 C) (Oral)   Ht 5' 7.5" (1.715 m)   Wt 229 lb 6 oz (104 kg)   SpO2 94%   BMI 35.39 kg/m    CC: 6 mo DM f/u visit  Subjective:   HPI: Erik Allen is a 68 y.o. male presenting on 03/15/2023 for Medical Management of Chronic Issues (Here for 6 mo DM f/u.)   Looking forward to gardening in warmer weather   Needs to schedule cardiology appt.   Colonoscopy 09/2022 - limited prep planned rpt next year with 2d prep.   Sees pulm (Aleserov) for pulm fibrosis (mild scattered pulm scarring and post inflammatory nodularity. Transaminitis thought related to Remdesivir + statin.   Established with [facility] Dr Delford Field for chronic dysphonia and h/o vocal fold lesions. Latest excision 08/2021, released from care.   DM - does regularly check sugars fasting 110-130s. Compliant with antihyperglycemic regimen which includes: metformin 500mg  bid, Rybelsus 7mg  daily. Denies low sugars or hypoglycemic symptoms. Denies paresthesias, blurry vision. Last diabetic eye exam done at [facility] doctor in [facility] 10/23/2022 - records requested. Glucometer brand: [email or web address]. Last foot exam: DUE. DSME: declined.  Lab Results  Component Value Date   HGBA1C 7.0 (A) 03/15/2023   Diabetic Foot Exam - Simple   Simple Foot Form Diabetic Foot exam was performed with the following findings: Yes 03/15/2023  8:58 AM  Visual Inspection No deformities, no ulcerations, no other skin breakdown bilaterally: Yes Sensation Testing Intact to touch and monofilament testing bilaterally: Yes Pulse Check Posterior Tibialis and Dorsalis pulse intact bilaterally: Yes Comments No claudication    Lab Results  Component Value Date   MICROALBUR 1.7 09/05/2022         Relevant past medical, surgical, family and social  history reviewed and updated as indicated. Interim medical history since our last visit reviewed. Allergies and medications reviewed and updated. Outpatient Medications Prior to Visit  Medication Sig Dispense Refill   Accu-Chek FastClix Lancets MISC Use as directed to check blood sugar daily 102 each 3   ACCU-CHEK GUIDE test strip USE AS INSTRUCTED TO CHECK BLOOD SUGAR DAILY 100 strip 3   albuterol (PROVENTIL) (2.5 MG/3ML) 0.083% nebulizer solution USE 1 VIAL VIA NEBULIZER EVERY 6 HOURS AS NEEDED 150 mL 3   albuterol (VENTOLIN HFA) 108 (90 Base) MCG/ACT inhaler TAKE 2 PUFFS BY MOUTH EVERY 6 HOURS AS NEEDED FOR WHEEZE OR SHORTNESS OF BREATH 8.5 each 5   ascorbic acid (VITAMIN C) 500 MG tablet Take 1 tablet (500 mg total) by mouth daily. 30 tablet 0   aspirin 81 MG EC tablet Take 1 tablet (81 mg total) by mouth daily. 30 tablet 0   cholecalciferol (VITAMIN D3) 10 MCG (400 UNIT) TABS tablet Take 400 Units by mouth daily.     cyanocobalamin (V-R VITAMIN B-12) 500 MCG tablet Take 1 tablet (500 mcg total) by mouth daily.     finasteride (PROSCAR) 5 MG tablet Take 1 tablet (5 mg total) by mouth daily. 90 tablet 4   fluticasone furoate-vilanterol (BREO ELLIPTA) 200-25 MCG/ACT AEPB INHALE 1 PUFF INTO THE LUNGS DAILY 180 each 0   hydrocortisone 2.5 % ointment Apply topically.     metFORMIN (GLUCOPHAGE) 500 MG tablet Take 1 tablet (500 mg total) by  mouth 2 (two) times daily with a meal. 180 tablet 4   montelukast (SINGULAIR) 10 MG tablet Take 1 tablet (10 mg total) by mouth at bedtime. 90 tablet 4   pantoprazole (PROTONIX) 40 MG tablet Take 1 tablet (40 mg total) by mouth at bedtime. 90 tablet 4   rosuvastatin (CRESTOR) 40 MG tablet Take 1 tablet (40 mg total) by mouth daily. 90 tablet 4   valsartan-hydrochlorothiazide (DIOVAN-HCT) 160-25 MG tablet Take 1 tablet by mouth daily. 90 tablet 4   zinc sulfate 220 (50 Zn) MG capsule Take 1 capsule (220 mg total) by mouth daily. 30 capsule 0   Semaglutide  (RYBELSUS) 7 MG TABS Take 1 tablet (7 mg total) by mouth daily. 30 tablet 6   No facility-administered medications prior to visit.     Per HPI unless specifically indicated in ROS section below Review of Systems  Objective:  BP 138/84   Pulse 92   Temp 98.2 F (36.8 C) (Oral)   Ht 5' 7.5" (1.715 m)   Wt 229 lb 6 oz (104 kg)   SpO2 94%   BMI 35.39 kg/m   Wt Readings from Last 3 Encounters:  03/15/23 229 lb 6 oz (104 kg)  10/11/22 221 lb (100.2 kg)  09/12/22 222 lb 8 oz (100.9 kg)      Physical Exam Vitals and nursing note reviewed.  Constitutional:      Appearance: Normal appearance. He is not ill-appearing.  HENT:     Head: Normocephalic and atraumatic.     Mouth/Throat:     Mouth: Mucous membranes are moist.     Pharynx: Oropharynx is clear. No oropharyngeal exudate or posterior oropharyngeal erythema.  Eyes:     Extraocular Movements: Extraocular movements intact.     Conjunctiva/sclera: Conjunctivae normal.     Pupils: Pupils are equal, round, and reactive to light.  Cardiovascular:     Rate and Rhythm: Normal rate and regular rhythm.     Pulses: Normal pulses.     Heart sounds: Murmur (3/6 systolic USB) heard.  Pulmonary:     Effort: Pulmonary effort is normal. No respiratory distress.     Breath sounds: Normal breath sounds. No wheezing, rhonchi or rales.  Musculoskeletal:     Right lower leg: No edema.     Left lower leg: No edema.     Comments: See HPI for foot exam if done  Skin:    General: Skin is warm and dry.     Findings: No rash.  Neurological:     Mental Status: He is alert.  Psychiatric:        Mood and Affect: Mood normal.        Behavior: Behavior normal.       Results for orders placed or performed in visit on 03/15/23  POCT glycosylated hemoglobin (Hb A1C)   Collection Time: 03/15/23  8:36 AM  Result Value Ref Range   Hemoglobin A1C 7.0 (A) 4.0 - 5.6 %   HbA1c POC (<> result, manual entry)     HbA1c, POC (prediabetic range)      HbA1c, POC (controlled diabetic range)      Assessment & Plan:   Problem List Items Addressed This Visit     Asthma   Stable period on breo, singulair, PRN albuterol Followed by pulm Dr Karna Christmas Mild pulm fibrosis from scarring. H/o COVID 2024.       Type 2 diabetes mellitus with other specified complication (HCC) - Primary   Chronic, stable period  on metformin and Rybelsus.  Desires to continue 7mg  dose. Discussed optimal Rybelsus dosing Will request latest eye exam.       Relevant Medications   Semaglutide (RYBELSUS) 7 MG TABS   Other Relevant Orders   POCT glycosylated hemoglobin (Hb A1C) (Completed)   Severe obesity (BMI 35.0-39.9) with comorbidity (HCC)   Weight gain noted. Continue to encourage healthy diet and lifestyle choices to affect sustainable weight loss. He declines higher Rybelsus dose.  Obesity associated with diabetes      Relevant Medications   Semaglutide (RYBELSUS) 7 MG TABS   Hoarseness   Improved after latest vocal cord lesion excision (benign) 08/2021 Delford Field @ [facility]). Released from care      S/P minimally invasive aortic valve replacement with bioprosthetic valve   He will call for cardiology f/u.       Need for SBE (subacute bacterial endocarditis) prophylaxis     Meds ordered this encounter  Medications   Semaglutide (RYBELSUS) 7 MG TABS    Sig: Take 1 tablet (7 mg total) by mouth daily.    Dispense:  30 tablet    Refill:  6    Orders Placed This Encounter  Procedures   POCT glycosylated hemoglobin (Hb A1C)    Patient Instructions  Try changing Rybelsus to 30 min before breakfast with 4 oz of water.  We will request records of latest eye exam.  Good to see you today Return in 6 months for medicare wellness/physical.  Call cardiology for a follow up appointment  Follow up plan: Return in about 6 months (around 09/12/2023) for annual exam, prior fasting for blood work, medicare wellness visit.  [name], MD

## 2023-03-15 NOTE — Assessment & Plan Note (Signed)
Improved after latest vocal cord lesion excision (benign) 08/2021 Delford Field @ [facility]). Released from care

## 2023-03-15 NOTE — Assessment & Plan Note (Signed)
He will call for cardiology f/u.

## 2023-03-15 NOTE — Assessment & Plan Note (Signed)
Stable period on breo, singulair, PRN albuterol Followed by pulm Dr Karna Christmas Mild pulm fibrosis from scarring. H/o COVID 2024.

## 2023-04-19 ENCOUNTER — Encounter: Payer: Self-pay | Admitting: Cardiology

## 2023-04-19 ENCOUNTER — Ambulatory Visit: Payer: Medicare PPO | Attending: Cardiology | Admitting: Cardiology

## 2023-04-19 VITALS — BP 134/84 | HR 96 | Ht 70.0 in | Wt 231.0 lb

## 2023-04-19 DIAGNOSIS — E7800 Pure hypercholesterolemia, unspecified: Secondary | ICD-10-CM

## 2023-04-19 DIAGNOSIS — Z953 Presence of xenogenic heart valve: Secondary | ICD-10-CM

## 2023-04-19 DIAGNOSIS — I10 Essential (primary) hypertension: Secondary | ICD-10-CM

## 2023-04-19 NOTE — Patient Instructions (Signed)
 Medication Instructions:  Your Physician recommend you continue on your current medication as directed.    *If you need a refill on your cardiac medications before your next appointment, please call your pharmacy*   Lab Work: None ordered at this time    Follow-Up: At [facility], you and your health needs are our priority.  As part of our continuing mission to provide you with exceptional heart care, we have created designated Provider Care Teams.  These Care Teams include your primary Cardiologist (physician) and Advanced Practice Providers (APPs -  Physician Assistants and Nurse Practitioners) who all work together to provide you with the care you need, when you need it.  We recommend signing up for the patient portal called "MyChart".  Sign up information is provided on this After Visit Summary.  MyChart is used to connect with patients for Virtual Visits (Telemedicine).  Patients are able to view lab/test results, encounter notes, upcoming appointments, etc.  Non-urgent messages can be sent to your provider as well.   To learn more about what you can do with MyChart, go to [email or web address].    Your next appointment:   1 year(s)  Provider:   You may see Debbe Odea, MD or one of the following Advanced Practice Providers on your designated Care Team:   Nicolasa Ducking, NP Eula Listen, PA-C Cadence Fransico Michael, PA-C Charlsie Quest, NP Carlos Levering, NP

## 2023-04-19 NOTE — Progress Notes (Signed)
 Cardiology Office Note:    Date:  04/19/2023   ID:  Erik Allen, DOB 01-30-1956, MRN [id number]  PCP:  [name], MD  Cardiologist:  Debbe Odea, MD  Electrophysiologist:  None   Referring MD: [name], MD   Chief Complaint  Patient presents with   Follow-up    Yearly follow up visit. Patient is doing well on today. Meds reviewed.      History of Present Illness:    Erik Allen is a 68 y.o. male with a hx of aortic stenosis s/p aortic valve replacement on 04/2019, hypertension, hyperlipidemia who presents for follow-up.   Feels well, denies chest pain or shortness of breath.  Compliant with medications as prescribed.  Blood pressure is adequately controlled at home.  Waiting for the weather to clear up so he can start exercising some more.  Otherwise has no cardiac concerns at this time.   Historical notes Echo 08/2019 EF 60 to 65%, normal functioning prosthetic valve. He underwent a minimally invasive aortic valve replacement with a bioprosthetic valve on 04/29/2019.  Postoperative course was uneventful.    Past Medical History:  Diagnosis Date   Aortic stenosis 06/21/2014   Echo 01/2018: Mild LVH, normal sys fxn EF 55-60%, G1DD, moderate AS with mildly dilated LA. rec yearly echocardiogram   Asthma 09/13/99   BENIGN PROSTATIC HYPERTROPHY 05/14/2003   CAP (community acquired pneumonia) 01/31/2015   COVID-19 virus infection 06/2019   Diabetes mellitus without complication (HCC)    Disorder of vocal cord 2012   h/o thrush on vocal cords per patient   GERD (gastroesophageal reflux disease) 10/13/01   ENT eval for GERD and thrush 2011   History of kidney stones    HTN (hypertension)    Hyperlipemia 10/13/97   S/P minimally invasive aortic valve replacement with bioprosthetic valve 04/29/2019   Edwards Inspiris Resilia stented bovine pericardial tissue valve (size 23 mm)   Seasonal allergic rhinitis    worse in spring   Systolic murmur     Transaminitis 2014   presumed fatty liver without [place], normal iron and viral hep panel   Vertigo    none - over 1 year    Past Surgical History:  Procedure Laterality Date   A1 pulley release  10/2010   stensosing tenosynovitis, R milddle, ring, little fingers   AORTIC VALVE REPLACEMENT N/A 04/29/2019   Procedure: MINIMALLY INVASIVE AORTIC VALVE REPLACEMENT (AVR) USING INSPIRIS VALVE SIZE ;  Surgeon: Purcell Nails, MD;  Location: [facility] OR;  Service: Open Heart Surgery;  Laterality: N/A;   COLONOSCOPY  2010   WNL, rpt due 10 yrs   COLONOSCOPY WITH PROPOFOL N/A 10/11/2022   TAs, diverticulosis, rpt 1 yr with 2d prep Allegra Lai, Loel Dubonnet, MD)   CYSTECTOMY     from back   CYSTOSCOPY  09/2000   Stone retrieval   DIRECT LARYNGOSCOPY N/A 07/12/2020   DIRECT LARYNGOSCOPY WITH EXCISION OF VOCAL CORD NODULE [name], Renae Fickle, MD)   EAR CYST EXCISION Left 08/02/2016   Procedure: EXCISION MUCOID CYST LEFT MIDDLE FINGER WITH DISTAL INTERPHALANGEAL JOINT ARHTROTOMY;  Surgeon: Cindee Salt, MD;  Location: [facility];  Service: Orthopedics;  Laterality: Left;   FOOT CAPSULE RELEASE W/ PERCUTANEOUS HEEL CORD LENGTHENING, TIBIAL TENDON TRANSFER  05/2007   bilateral, Dr. Al Corpus   HEMOSTASIS CLIP PLACEMENT  10/11/2022   Procedure: HEMOSTASIS CLIP PLACEMENT;  Surgeon: Toney Reil, MD;  Location: [facility] ENDOSCOPY;  Service: Gastroenterology;;   INGUINAL HERNIA REPAIR  06/28/2009  left, with mesh, Dr. Lemar Livings   POLYPECTOMY  10/11/2022   Procedure: POLYPECTOMY;  Surgeon: Toney Reil, MD;  Location: [facility] ENDOSCOPY;  Service: Gastroenterology;;   RIGHT/LEFT HEART CATH AND CORONARY ANGIOGRAPHY Bilateral 04/06/2019   Procedure: RIGHT/LEFT HEART CATH AND CORONARY ANGIOGRAPHY;  Surgeon: Iran Ouch, MD;  Location: [facility] INVASIVE CV LAB;  Service: Cardiovascular;  Laterality: Bilateral;   ROTATION FLAP Left 08/02/2016   Procedure: ROTATION DORSAL FLAP;  Surgeon: Cindee Salt, MD;   Location:  SURGERY CENTER;  Service: Orthopedics;  Laterality: Left;   TEE WITHOUT CARDIOVERSION N/A 04/29/2019   Procedure: TRANSESOPHAGEAL ECHOCARDIOGRAM (TEE);  Surgeon: Purcell Nails, MD;  Location: [facility] OR;  Service: Open Heart Surgery;  Laterality: N/A;   US ECHOCARDIOGRAPHY  10/1999   TR, MR    Current Medications: Current Meds  Medication Sig   Accu-Chek FastClix Lancets MISC Use as directed to check blood sugar daily   ACCU-CHEK GUIDE test strip USE AS INSTRUCTED TO CHECK BLOOD SUGAR DAILY   albuterol (PROVENTIL) (2.5 MG/3ML) 0.083% nebulizer solution USE 1 VIAL VIA NEBULIZER EVERY 6 HOURS AS NEEDED   albuterol (VENTOLIN HFA) 108 (90 Base) MCG/ACT inhaler TAKE 2 PUFFS BY MOUTH EVERY 6 HOURS AS NEEDED FOR WHEEZE OR SHORTNESS OF BREATH   ascorbic acid (VITAMIN C) 500 MG tablet Take 1 tablet (500 mg total) by mouth daily.   aspirin 81 MG EC tablet Take 1 tablet (81 mg total) by mouth daily.   cholecalciferol (VITAMIN D3) 10 MCG (400 UNIT) TABS tablet Take 400 Units by mouth daily.   cyanocobalamin (V-R VITAMIN B-12) 500 MCG tablet Take 1 tablet (500 mcg total) by mouth daily.   finasteride (PROSCAR) 5 MG tablet Take 1 tablet (5 mg total) by mouth daily.   fluticasone furoate-vilanterol (BREO ELLIPTA) 200-25 MCG/ACT AEPB INHALE 1 PUFF INTO THE LUNGS DAILY   hydrocortisone 2.5 % ointment Apply topically.   metFORMIN (GLUCOPHAGE) 500 MG tablet Take 1 tablet (500 mg total) by mouth 2 (two) times daily with a meal.   montelukast (SINGULAIR) 10 MG tablet Take 1 tablet (10 mg total) by mouth at bedtime.   pantoprazole (PROTONIX) 40 MG tablet Take 1 tablet (40 mg total) by mouth at bedtime.   rosuvastatin (CRESTOR) 40 MG tablet Take 1 tablet (40 mg total) by mouth daily.   Semaglutide (RYBELSUS) 7 MG TABS Take 1 tablet (7 mg total) by mouth daily.   valsartan-hydrochlorothiazide (DIOVAN-HCT) 160-25 MG tablet Take 1 tablet by mouth daily.   zinc sulfate 220 (50 Zn) MG capsule Take  1 capsule (220 mg total) by mouth daily.     Allergies:   Ace inhibitors, Codeine, and Tramadol   Social History   Socioeconomic History   Marital status: Single    Spouse name: Not on file   Number of children: Not on file   Years of education: Not on file   Highest education level: Not on file  Occupational History   Occupation: TEAM LEADER    Employer: [employer]    Comment: Sheet metal fabrication  Tobacco Use   Smoking status: Never   Smokeless tobacco: Never  Vaping Use   Vaping status: Never Used  Substance and Sexual Activity   Alcohol use: Yes    Alcohol/week: 1.0 - 2.0 standard drink of alcohol    Types: 1 - 2 Standard drinks or equivalent per week    Comment: occassionally   Drug use: No   Sexual activity: Yes  Other Topics Concern  Not on file  Social History Narrative   Caffeine: a couple mountain dews/day, occasional coffee   Lives alone, no pets   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  ROS:   Please see the history of present illness.     All other systems reviewed and are negative.  EKGs/Labs/Other Studies Reviewed:    The following studies were reviewed today:   EKG Interpretation Date/Time:  Friday April 19 2023 08:29:27 EST Ventricular Rate:  96 PR Interval:  184 QRS Duration:  90 QT Interval:  362 QTC Calculation: 457 R Axis:   -35  Text Interpretation: Normal sinus rhythm Left axis deviation Inferior infarct , age  undetermined Confirmed by Debbe Odea ([id number]) on 04/19/2023 8:38:21 AM    Recent Labs: 09/05/2022: ALT 60; BUN 22; Creatinine, Ser 1.01; Hemoglobin 14.2; Platelets 208.0; Potassium 4.2; Sodium 139; TSH 1.84  Recent Lipid Panel    Component Value Date/Time   CHOL 146 09/05/2022 0747   TRIG 155.0 (H) 09/05/2022 0747   HDL 54.30 09/05/2022 0747   CHOLHDL 3 09/05/2022 0747   VLDL 31.0 09/05/2022 0747   LDLCALC 61 09/05/2022 0747   LDLDIRECT 75.0 08/29/2021 0755    Physical Exam:    VS:  BP 134/84   Pulse 96   Ht 5\' 10"  (1.778 m)   Wt 231 lb (104.8 kg)   SpO2 94%   BMI 33.15 kg/m     Wt Readings from Last 3 Encounters:  04/19/23 231 lb (104.8 kg)  03/15/23 229 lb 6 oz (104 kg)  10/11/22 221 lb (100.2 kg)     GEN:  Well nourished, well developed in no acute distress HEENT: Normal NECK: No JVD; No carotid bruits CARDIAC: RRR, 1/6 systolic murmur, rusb, no gallops RESPIRATORY:  Clear to auscultation without rales, wheezing or rhonchi  ABDOMEN: Soft, non-tender, non-distended MUSCULOSKELETAL:  No edema; No deformity  SKIN: Warm and dry NEUROLOGIC:  Alert and oriented x 3 PSYCHIATRIC:  Normal affect   ASSESSMENT:    1. S/P aortic valve replacement with bioprosthetic valve   2. Essential hypertension   3. Pure hypercholesterolemia    PLAN:    In order of problems listed above:  severe aortic stenosis s/p aortic valve replacement with a bioprosthetic valve on 04/2019, minimally invasive approach.  last  Echo 1/24 EF 60 to 65%, normal functioning bioprosthetic valve.   Hypertension, BP controlled.  Continue valsartan-hydrochlorothiazide 160-25 mg daily. History of hyperlipidemia, continue Crestor 40 mg daily.  Follow-up yearly    Medication Adjustments/Labs and Tests Ordered: Current medicines are reviewed at length with the patient today.  Concerns regarding medicines are outlined above.  Orders Placed This Encounter  Procedures   EKG 12-Lead    No orders of  the defined types were placed in this encounter.    Patient Instructions  Medication Instructions:  Your Physician recommend you continue on your current medication as directed.    *If you need a refill on  your cardiac medications before your next appointment, please call your pharmacy*   Lab Work: None ordered at this time    Follow-Up: At [facility], you and your health needs are our priority.  As part of our continuing mission to provide you with exceptional heart care, we have created designated Provider Care Teams.  These Care Teams include your primary Cardiologist (physician) and Advanced Practice Providers (APPs -  Physician Assistants and Nurse Practitioners) who all work together to provide you with the care you need, when you need it.  We recommend signing up for the patient portal called "MyChart".  Sign up information is provided on this After Visit Summary.  MyChart is used to connect with patients for Virtual Visits (Telemedicine).  Patients are able to view lab/test results, encounter notes, upcoming appointments, etc.  Non-urgent messages can be sent to your provider as well.   To learn more about what you can do with MyChart, go to [email or web address].    Your next appointment:   1 year(s)  Provider:   You may see Debbe Odea, MD or one of the following Advanced Practice Providers on your designated Care Team:   Nicolasa Ducking, NP Eula Listen, PA-C Cadence Fransico Michael, PA-C Charlsie Quest, NP Carlos Levering, NP      Signed, Debbe Odea, MD  04/19/2023 9:22 AM    [facility]

## 2023-05-16 ENCOUNTER — Other Ambulatory Visit (HOSPITAL_COMMUNITY): Payer: Self-pay

## 2023-06-09 ENCOUNTER — Other Ambulatory Visit: Payer: Self-pay | Admitting: Family Medicine

## 2023-06-09 DIAGNOSIS — E119 Type 2 diabetes mellitus without complications: Secondary | ICD-10-CM

## 2023-07-31 DIAGNOSIS — J8410 Pulmonary fibrosis, unspecified: Secondary | ICD-10-CM | POA: Diagnosis not present

## 2023-08-13 ENCOUNTER — Ambulatory Visit (INDEPENDENT_AMBULATORY_CARE_PROVIDER_SITE_OTHER)

## 2023-08-13 VITALS — BP 134/84 | Ht 70.0 in | Wt 220.0 lb

## 2023-08-13 DIAGNOSIS — Z0000 Encounter for general adult medical examination without abnormal findings: Secondary | ICD-10-CM | POA: Diagnosis not present

## 2023-08-13 DIAGNOSIS — Z2821 Immunization not carried out because of patient refusal: Secondary | ICD-10-CM

## 2023-08-13 DIAGNOSIS — Z5320 Procedure and treatment not carried out because of patient's decision for unspecified reasons: Secondary | ICD-10-CM

## 2023-08-13 NOTE — Patient Instructions (Signed)
 Erik Allen , Thank you for taking time out of your busy schedule to complete your Annual Wellness Visit with me. I enjoyed our conversation and look forward to speaking with you again next year. I, as well as your care team,  appreciate your ongoing commitment to your health goals. Please review the following plan we discussed and let me know if I can assist you in the future. Your Game plan/ To Do List    Referrals: If you haven't heard from the office you've been referred to, please reach out to them at the phone provided.  none Follow up Visits: Next Medicare AWV with our clinical staff: 08/13/2024   Have you seen your provider in the last 6 months (3 months if uncontrolled diabetes)? No Next Office Visit with your provider: 09/13/2023  Clinician Recommendations:  Aim for 30 minutes of exercise or brisk walking, 6-8 glasses of water , and 5 servings of fruits and vegetables each day.       This is a list of the screening recommended for you and due dates:  Health Maintenance  Topic Date Due   COVID-19 Vaccine (1) Never done   Zoster (Shingles) Vaccine (1 of 2) Never done   Yearly kidney health urinalysis for diabetes  05/25/2010   Eye exam for diabetics  09/28/2022   Yearly kidney function blood test for diabetes  09/05/2023   Medicare Annual Wellness Visit  09/12/2023   Colon Cancer Screening  10/11/2023   Hemoglobin A1C  09/12/2023   Flu Shot  09/13/2023   Stool Blood Test  10/11/2023   Complete foot exam   03/14/2024   DTaP/Tdap/Td vaccine (4 - Td or Tdap) 06/20/2024   Pneumococcal Vaccine for age over 69  Completed   Hepatitis C Screening  Completed   HIV Screening  Completed   Hepatitis B Vaccine  Aged Out   HPV Vaccine  Aged Out   Meningitis B Vaccine  Aged Out    Advanced directives: (Declined) Advance directive discussed with you today. Even though you declined this today, please call our office should you change your mind, and we can give you the proper paperwork for  you to fill out. Advance Care Planning is important because it:  [x]  Makes sure you receive the medical care that is consistent with your values, goals, and preferences  [x]  It provides guidance to your family and loved ones and reduces their decisional burden about whether or not they are making the right decisions based on your wishes.  Follow the link provided in your after visit summary or read over the paperwork we have mailed to you to help you started getting your Advance Directives in place. If you need assistance in completing these, please reach out to us  so that we can help you!  See attachments for Preventive Care and Fall Prevention Tips.

## 2023-08-13 NOTE — Progress Notes (Signed)
 Because this visit was a virtual/telehealth visit,  certain criteria was not obtained, such a blood pressure, CBG if applicable, and timed get up and go. Any medications not marked as taking were not mentioned during the medication reconciliation part of the visit. Any vitals not documented were not able to be obtained due to this being a telehealth visit or patient was unable to self-report a recent blood pressure reading due to a lack of equipment at home via telehealth. Vitals that have been documented are verbally provided by the patient.   This visit was performed by a medical professional under my direct supervision. I was immediately available for consultation/collaboration. I have reviewed and agree with the Annual Wellness Visit documentation.  Subjective:   Erik Allen is a 68 y.o. who presents for a Medicare Wellness preventive visit.  As a reminder, Annual Wellness Visits don't include a physical exam, and some assessments may be limited, especially if this visit is performed virtually. We may recommend an in-person follow-up visit with your provider if needed.  Visit Complete: Virtual I connected with  Erik Allen on 08/13/23 by a audio enabled telemedicine application and verified that I am speaking with the correct person using two identifiers.  Patient Location: Home  Provider Location: Home Office  I discussed the limitations of evaluation and management by telemedicine. The patient expressed understanding and agreed to proceed.  Vital Signs: Because this visit was a virtual/telehealth visit, some criteria may be missing or patient reported. Any vitals not documented were not able to be obtained and vitals that have been documented are patient reported.  VideoDeclined- This patient declined Librarian, academic. Therefore the visit was completed with audio only.  Persons Participating in Visit: Patient.  AWV Questionnaire: No: Patient  Medicare AWV questionnaire was not completed prior to this visit.  Cardiac Risk Factors include: advanced age (>29men, >31 women);male gender;obesity (BMI >30kg/m2);diabetes mellitus;dyslipidemia     Objective:    Today's Vitals   08/13/23 0901  BP: 134/84  Weight: 220 lb (99.8 kg)  Height: 5' 10 (1.778 m)   Body mass index is 31.57 kg/m.     08/13/2023    9:04 AM 10/11/2022    8:00 AM 07/12/2020    8:44 AM 06/21/2019    9:00 PM 05/22/2019   10:00 AM 05/01/2019    5:00 PM 04/27/2019    9:07 AM  Advanced Directives  Does Patient Have a Medical Advance Directive? Yes Yes Yes No;Yes No Yes Yes  Type of Estate agent of [place];Living will  Healthcare Power of [place];Living will Healthcare Power of [employer] of [place];Living will Healthcare Power of [place];Living will  Does patient want to make changes to medical advance directive? No - Patient declined  No - Patient declined   No - Patient declined   Copy of Healthcare Power of Attorney in Chart? No - copy requested  No - copy requested   No - copy requested No - copy requested  Would patient like information on creating a medical advance directive?     No - Patient declined      Current Medications (verified) Outpatient Encounter Medications as of 08/13/2023  Medication Sig   Accu-Chek FastClix Lancets MISC Use as directed to check blood sugar daily   ACCU-CHEK GUIDE TEST test strip USE AS INSTRUCTED TO CHECK BLOOD SUGAR DAILY   albuterol  (PROVENTIL ) (2.5 MG/3ML) 0.083% nebulizer solution USE 1 VIAL VIA NEBULIZER EVERY 6 HOURS AS NEEDED  albuterol  (VENTOLIN  HFA) 108 (90 Base) MCG/ACT inhaler TAKE 2 PUFFS BY MOUTH EVERY 6 HOURS AS NEEDED FOR WHEEZE OR SHORTNESS OF BREATH   ascorbic acid  (VITAMIN C) 500 MG tablet Take 1 tablet (500 mg total) by mouth daily.   aspirin  81 MG EC tablet Take 1 tablet (81 mg total) by mouth daily.   cholecalciferol  (VITAMIN D3) 10 MCG (400 UNIT) TABS tablet Take 400  Units by mouth daily.   cyanocobalamin  (V-R VITAMIN B-12) 500 MCG tablet Take 1 tablet (500 mcg total) by mouth daily.   finasteride  (PROSCAR ) 5 MG tablet Take 1 tablet (5 mg total) by mouth daily.   fluticasone  furoate-vilanterol (BREO ELLIPTA ) 200-25 MCG/ACT AEPB INHALE 1 PUFF INTO THE LUNGS DAILY   hydrocortisone 2.5 % ointment Apply topically.   metFORMIN  (GLUCOPHAGE ) 500 MG tablet Take 1 tablet (500 mg total) by mouth 2 (two) times daily with a meal.   montelukast  (SINGULAIR ) 10 MG tablet Take 1 tablet (10 mg total) by mouth at bedtime.   pantoprazole  (PROTONIX ) 40 MG tablet Take 1 tablet (40 mg total) by mouth at bedtime.   rosuvastatin  (CRESTOR ) 40 MG tablet Take 1 tablet (40 mg total) by mouth daily.   Semaglutide  (RYBELSUS ) 7 MG TABS Take 1 tablet (7 mg total) by mouth daily.   valsartan -hydrochlorothiazide  (DIOVAN -HCT) 160-25 MG tablet Take 1 tablet by mouth daily.   zinc  sulfate 220 (50 Zn) MG capsule Take 1 capsule (220 mg total) by mouth daily.   No facility-administered encounter medications on file as of 08/13/2023.    Allergies (verified) Ace inhibitors, Codeine , and Tramadol    History: Past Medical History:  Diagnosis Date   Aortic stenosis 06/21/2014   Echo 01/2018: Mild LVH, normal sys fxn EF 55-60%, G1DD, moderate AS with mildly dilated LA. rec yearly echocardiogram   Asthma 09/13/99   BENIGN PROSTATIC HYPERTROPHY 05/14/2003   CAP (community acquired pneumonia) 01/31/2015   COVID-19 virus infection 06/2019   Diabetes mellitus without complication (HCC)    Disorder of vocal cord 2012   h/o thrush on vocal cords per patient   GERD (gastroesophageal reflux disease) 10/13/01   ENT eval for GERD and thrush 2011   History of kidney stones    HTN (hypertension)    Hyperlipemia 10/13/97   S/P minimally invasive aortic valve replacement with bioprosthetic valve 04/29/2019   Edwards Inspiris Resilia stented bovine pericardial tissue valve (size 23 mm)   Seasonal allergic  rhinitis    worse in spring   Systolic murmur    Transaminitis 2014   presumed fatty liver without US , normal iron and viral hep panel   Vertigo    none - over 1 year   Past Surgical History:  Procedure Laterality Date   A1 pulley release  10/2010   stensosing tenosynovitis, R milddle, ring, little fingers   AORTIC VALVE REPLACEMENT N/A 04/29/2019   Procedure: MINIMALLY INVASIVE AORTIC VALVE REPLACEMENT (AVR) USING INSPIRIS VALVE SIZE ;  Surgeon: Dusty Sudie DEL, MD;  Location: [facility] OR;  Service: Open Heart Surgery;  Laterality: N/A;   COLONOSCOPY  2010   WNL, rpt due 10 yrs   COLONOSCOPY WITH PROPOFOL  N/A 10/11/2022   TAs, diverticulosis, rpt 1 yr with 2d prep Caprice, Corinn Skiff, MD)   CYSTECTOMY     from back   CYSTOSCOPY  09/2000   Stone retrieval   DIRECT LARYNGOSCOPY N/A 07/12/2020   DIRECT LARYNGOSCOPY WITH EXCISION OF VOCAL CORD NODULE Belinda, Deward, MD)   EAR CYST EXCISION Left 08/02/2016  Procedure: EXCISION MUCOID CYST LEFT MIDDLE FINGER WITH DISTAL INTERPHALANGEAL JOINT ARHTROTOMY;  Surgeon: Murrell Kuba, MD;  Location: [facility];  Service: Orthopedics;  Laterality: Left;   FOOT CAPSULE RELEASE W/ PERCUTANEOUS HEEL CORD LENGTHENING, TIBIAL TENDON TRANSFER  05/2007   bilateral, Dr. Verta   HEMOSTASIS CLIP PLACEMENT  10/11/2022   Procedure: HEMOSTASIS CLIP PLACEMENT;  Surgeon: Unk Corinn Skiff, MD;  Location: [facility] ENDOSCOPY;  Service: Gastroenterology;;   INGUINAL HERNIA REPAIR  06/28/2009   left, with mesh, Dr. Dessa   POLYPECTOMY  10/11/2022   Procedure: POLYPECTOMY;  Surgeon: Unk Corinn Skiff, MD;  Location: [facility] ENDOSCOPY;  Service: Gastroenterology;;   RIGHT/LEFT HEART CATH AND CORONARY ANGIOGRAPHY Bilateral 04/06/2019   Procedure: RIGHT/LEFT HEART CATH AND CORONARY ANGIOGRAPHY;  Surgeon: Darron Deatrice LABOR, MD;  Location: [facility] INVASIVE CV LAB;  Service: Cardiovascular;  Laterality: Bilateral;   ROTATION FLAP Left 08/02/2016   Procedure:  ROTATION DORSAL FLAP;  Surgeon: Murrell Kuba, MD;  Location: [facility];  Service: Orthopedics;  Laterality: Left;   TEE WITHOUT CARDIOVERSION N/A 04/29/2019   Procedure: TRANSESOPHAGEAL ECHOCARDIOGRAM (TEE);  Surgeon: Dusty Sudie DEL, MD;  Location: [facility] OR;  Service: Open Heart Surgery;  Laterality: N/A;   US  ECHOCARDIOGRAPHY  10/1999   TR, MR   Family History  Problem Relation Age of Onset   Hypertension Mother    Cancer Father 1       Lymphoma, recurrent   Hypertension Father    Hypertension Brother    Social History   Socioeconomic History   Marital status: Single    Spouse name: Not on file   Number of children: Not on file   Years of education: Not on file   Highest education level: Not on file  Occupational History   Occupation: TEAM LEADER    Employer: [employer]    Comment: Sheet metal fabrication  Tobacco Use   Smoking status: Never   Smokeless tobacco: Never  Vaping Use   Vaping status: Never Used  Substance and Sexual Activity   Alcohol use: Yes    Alcohol/week: 1.0 - 2.0 standard drink of alcohol    Types: 1 - 2 Standard drinks or equivalent per week    Comment: occassionally   Drug use: No   Sexual activity: Yes  Other Topics Concern   Not on file  Social History Narrative   Caffeine: a couple mountain dews/day, occasional coffee   Lives alone, no pets   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, 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    Activity: no regular exercise, trying to restart routine   Diet: good water , vegetables daily, some fish/meat   Social Drivers of Corporate investment banker Strain: Low Risk  (08/13/2023)   Overall Financial Resource Strain (CARDIA)    Difficulty of Paying Living Expenses: Not hard at all  Food Insecurity: No Food Insecurity (08/13/2023)   Hunger Vital Sign    Worried About Running Out of Food in the Last Year: Never true    Ran Out of Food in the Last Year: Never true  Transportation Needs: No  Transportation Needs (08/13/2023)   PRAPARE - Administrator, Civil Service (Medical): No    Lack of Transportation (Non-Medical): No  Physical Activity: Sufficiently Active (08/13/2023)   Exercise Vital Sign    Days of Exercise per Week: 5 days    Minutes of Exercise per Session: 120 min  Stress: No Stress Concern Present (08/13/2023)   [employer]  of Occupational Health - Occupational Stress Questionnaire    Feeling of Stress: Not at all  Social Connections: Moderately Isolated (08/13/2023)   Social Connection and Isolation Panel    Frequency of Communication with Friends and Family: More than three times a week    Frequency of Social Gatherings with Friends and Family: More than three times a week    Attends Religious Services: 1 to 4 times per year    Active Member of [employer] or Organizations: No    Attends Engineer, structural: Never    Marital Status: Never married    Tobacco Counseling Counseling given: Not Answered    Clinical Intake:  Pre-visit preparation completed: Yes  Pain : No/denies pain     BMI - recorded: 31.57 Nutritional Status: BMI > 30  Obese Nutritional Risks: None Diabetes: Yes CBG done?: No Did pt. bring in CBG monitor from home?: No  Lab Results  Component Value Date   HGBA1C 7.0 (A) 03/15/2023   HGBA1C 7.0 (H) 09/05/2022   HGBA1C 6.9 (A) 03/09/2022     How often do you need to have someone help you when you read instructions, pamphlets, or other written materials from your doctor or pharmacy?: 1 - Never What is the last grade level you completed in school?: some college  Interpreter Needed?: No  Information entered by :: Jordynn Marcella,cma   Activities of Daily Living     08/13/2023    9:03 AM  In your present state of health, do you have any difficulty performing the following activities:  Hearing? 0  Vision? 0  Difficulty concentrating or making decisions? 0  Walking or climbing stairs? 0  Dressing or  bathing? 0  Doing errands, shopping? 0  Preparing Food and eating ? N  Using the Toilet? N  In the past six months, have you accidently leaked urine? N  Do you have problems with loss of bowel control? N  Managing your Medications? N  Managing your Finances? N  Housekeeping or managing your Housekeeping? N    Patient Care Team: Rilla Baller, MD as PCP - General (Family Medicine) Darliss Rogue, MD as PCP - Cardiology (Cardiology) [facility] (Optometry)  I have updated your Care Teams any recent Medical Services you may have received from other providers in the past year.     Assessment:   This is a routine wellness examination for [place].  Hearing/Vision screen Hearing Screening - Comments:: No difficulties Vision Screening - Comments:: Patient wears glasses   Goals Addressed             This Visit's Progress    Patient Stated       Lose some weight       Depression Screen     08/13/2023    9:05 AM 03/15/2023    9:34 AM 09/12/2022    8:19 AM 07/02/2022   11:54 AM 03/09/2022    8:14 AM 09/05/2021    9:57 AM 08/31/2020   10:28 AM  PHQ 2/9 Scores  PHQ - 2 Score 0 0 0 0 0 0 0  PHQ- 9 Score 0      1    Fall Risk     08/13/2023    9:04 AM 09/12/2022    8:19 AM 07/02/2022   11:54 AM 03/09/2022    8:13 AM 05/22/2019   10:00 AM  Fall Risk   Falls in the past year? 0 1 0  0 0   Number falls in past yr: 0 0     Injury with Fall? 0      Risk for fall due to : No Fall Risks      Follow up Falls evaluation completed         Data saved with a previous flowsheet row definition    MEDICARE RISK AT HOME:  Medicare Risk at Home Any stairs in or around the home?: Yes If so, are there any without handrails?: No Home free of loose throw rugs in walkways, pet beds, electrical cords, etc?: Yes Adequate lighting in your home to reduce risk of falls?: Yes Life alert?: No Use of a cane, walker or  w/c?: No Grab bars in the bathroom?: No Shower chair or bench in shower?: No Elevated toilet seat or a handicapped toilet?: No  TIMED UP AND GO:  Was the test performed?  No  Cognitive Function: 6CIT completed        08/13/2023    9:02 AM  6CIT Screen  What Year? 0 points  What month? 0 points  What time? 0 points  Count back from 20 0 points  Months in reverse 0 points  Repeat phrase 0 points  Total Score 0 points    Immunizations Immunization History  Administered Date(s) Administered   Fluad Trivalent(High Dose 65+) 12/18/2022   Influenza Whole 11/12/2004, 11/20/2006, 11/18/2007, 12/09/2008   Influenza,inj,Quad PF,6+ Mos 12/02/2012, 11/20/2013, 12/18/2014, 10/31/2015, 11/05/2016, 11/19/2017, 10/27/2018, 11/17/2019, 12/06/2020, 03/09/2022   PNEUMOCOCCAL CONJUGATE-20 08/31/2020   Pneumococcal Polysaccharide-23 01/16/2013   Td 01/13/1995, 06/12/2004   Tdap 06/21/2014    Screening Tests Health Maintenance  Topic Date Due   COVID-19 Vaccine (1) Never done   Zoster Vaccines- Shingrix (1 of 2) Never done   Diabetic kidney evaluation - Urine ACR  05/25/2010   OPHTHALMOLOGY EXAM  09/28/2022   Diabetic kidney evaluation - eGFR measurement  09/05/2023   Colonoscopy  10/11/2023   HEMOGLOBIN A1C  09/12/2023   INFLUENZA VACCINE  09/13/2023   COLON CANCER SCREENING ANNUAL FOBT  10/11/2023   FOOT EXAM  03/14/2024   DTaP/Tdap/Td (4 - Td or Tdap) 06/20/2024   Medicare Annual Wellness (AWV)  08/12/2024   Pneumococcal Vaccine: 50+ Years  Completed   Hepatitis C Screening  Completed   HIV Screening  Completed   Hepatitis B Vaccines  Aged Out   HPV VACCINES  Aged Out   Meningococcal B Vaccine  Aged Out    Health Maintenance  Health Maintenance Due  Topic Date Due   COVID-19 Vaccine (1) Never done   Zoster Vaccines- Shingrix (1 of 2) Never done   Diabetic kidney evaluation - Urine ACR  05/25/2010   OPHTHALMOLOGY EXAM  09/28/2022   Diabetic kidney evaluation - eGFR  measurement  09/05/2023   Colonoscopy  10/11/2023   Health Maintenance Items Addressed:   Additional Screening:  Vision Screening: Recommended annual ophthalmology exams for early detection of glaucoma and other disorders of the eye. Would you like a referral to an eye doctor? No    Dental Screening: Recommended annual dental exams for proper oral hygiene  Community Resource Referral / Chronic Care Management: CRR required this visit?  No   CCM required this visit?  No   Plan:    I have personally reviewed and noted the following in the patient's chart:   Medical and social history Use of alcohol, tobacco or illicit drugs  Current medications and supplements including opioid prescriptions. Patient is not currently  taking opioid prescriptions. Functional ability and status Nutritional status Physical activity Advanced directives List of other physicians Hospitalizations, surgeries, and ER visits in previous 12 months Vitals Screenings to include cognitive, depression, and falls Referrals and appointments  In addition, I have reviewed and discussed with patient certain preventive protocols, quality metrics, and best practice recommendations. A written personalized care plan for preventive services as well as general preventive health recommendations were provided to patient.   Lyle MARLA Right, [place]   08/13/2023   After Visit Summary: (MyChart) Due to this being a telephonic visit, the after visit summary with patients personalized plan was offered to patient via MyChart   Notes: Nothing significant to report at this time.

## 2023-08-19 DIAGNOSIS — L570 Actinic keratosis: Secondary | ICD-10-CM | POA: Diagnosis not present

## 2023-08-19 DIAGNOSIS — D485 Neoplasm of uncertain behavior of skin: Secondary | ICD-10-CM | POA: Diagnosis not present

## 2023-08-19 DIAGNOSIS — D2261 Melanocytic nevi of right upper limb, including shoulder: Secondary | ICD-10-CM | POA: Diagnosis not present

## 2023-08-19 DIAGNOSIS — D2271 Melanocytic nevi of right lower limb, including hip: Secondary | ICD-10-CM | POA: Diagnosis not present

## 2023-08-19 DIAGNOSIS — L821 Other seborrheic keratosis: Secondary | ICD-10-CM | POA: Diagnosis not present

## 2023-08-19 DIAGNOSIS — D044 Carcinoma in situ of skin of scalp and neck: Secondary | ICD-10-CM | POA: Diagnosis not present

## 2023-08-19 DIAGNOSIS — D225 Melanocytic nevi of trunk: Secondary | ICD-10-CM | POA: Diagnosis not present

## 2023-08-19 DIAGNOSIS — D2262 Melanocytic nevi of left upper limb, including shoulder: Secondary | ICD-10-CM | POA: Diagnosis not present

## 2023-08-19 DIAGNOSIS — L218 Other seborrheic dermatitis: Secondary | ICD-10-CM | POA: Diagnosis not present

## 2023-08-19 DIAGNOSIS — D2272 Melanocytic nevi of left lower limb, including hip: Secondary | ICD-10-CM | POA: Diagnosis not present

## 2023-08-26 DIAGNOSIS — J8410 Pulmonary fibrosis, unspecified: Secondary | ICD-10-CM | POA: Diagnosis not present

## 2023-08-26 DIAGNOSIS — Z7185 Encounter for immunization safety counseling: Secondary | ICD-10-CM | POA: Diagnosis not present

## 2023-08-26 DIAGNOSIS — J4540 Moderate persistent asthma, uncomplicated: Secondary | ICD-10-CM | POA: Diagnosis not present

## 2023-09-01 ENCOUNTER — Other Ambulatory Visit: Payer: Self-pay | Admitting: Family Medicine

## 2023-09-01 DIAGNOSIS — E559 Vitamin D deficiency, unspecified: Secondary | ICD-10-CM

## 2023-09-01 DIAGNOSIS — E1169 Type 2 diabetes mellitus with other specified complication: Secondary | ICD-10-CM

## 2023-09-01 DIAGNOSIS — N400 Enlarged prostate without lower urinary tract symptoms: Secondary | ICD-10-CM

## 2023-09-01 DIAGNOSIS — K760 Fatty (change of) liver, not elsewhere classified: Secondary | ICD-10-CM

## 2023-09-03 ENCOUNTER — Other Ambulatory Visit: Payer: Self-pay | Admitting: Family Medicine

## 2023-09-06 ENCOUNTER — Other Ambulatory Visit: Payer: Medicare PPO

## 2023-09-06 DIAGNOSIS — E1169 Type 2 diabetes mellitus with other specified complication: Secondary | ICD-10-CM

## 2023-09-06 DIAGNOSIS — N400 Enlarged prostate without lower urinary tract symptoms: Secondary | ICD-10-CM

## 2023-09-06 DIAGNOSIS — K760 Fatty (change of) liver, not elsewhere classified: Secondary | ICD-10-CM | POA: Diagnosis not present

## 2023-09-06 DIAGNOSIS — E785 Hyperlipidemia, unspecified: Secondary | ICD-10-CM | POA: Diagnosis not present

## 2023-09-06 DIAGNOSIS — E559 Vitamin D deficiency, unspecified: Secondary | ICD-10-CM | POA: Diagnosis not present

## 2023-09-06 LAB — LIPID PANEL
Cholesterol: 137 mg/dL (ref 0–200)
HDL: 49.5 mg/dL (ref >39.00)
LDL Cholesterol: 53 mg/dL (ref 0–99)
NonHDL: 87.88
Total CHOL/HDL Ratio: 3
Triglycerides: 175 mg/dL — ABNORMAL HIGH (ref 0.0–149.0)
VLDL: 35 mg/dL (ref 0.0–40.0)

## 2023-09-06 LAB — COMPREHENSIVE METABOLIC PANEL WITH GFR
ALT: 72 U/L — ABNORMAL HIGH (ref 0–53)
AST: 57 U/L — ABNORMAL HIGH (ref 0–37)
Albumin: 4.5 g/dL (ref 3.5–5.2)
Alkaline Phosphatase: 43 U/L (ref 39–117)
BUN: 24 mg/dL — ABNORMAL HIGH (ref 6–23)
CO2: 31 meq/L (ref 19–32)
Calcium: 9.3 mg/dL (ref 8.4–10.5)
Chloride: 100 meq/L (ref 96–112)
Creatinine, Ser: 0.97 mg/dL (ref 0.40–1.50)
GFR: 81.63 mL/min (ref >60.00)
Glucose, Bld: 130 mg/dL — ABNORMAL HIGH (ref 70–99)
Potassium: 4.1 meq/L (ref 3.5–5.1)
Sodium: 140 meq/L (ref 135–145)
Total Bilirubin: 0.7 mg/dL (ref 0.2–1.2)
Total Protein: 7 g/dL (ref 6.0–8.3)

## 2023-09-06 LAB — CBC WITH DIFFERENTIAL/PLATELET
Basophils Absolute: 0.1 K/uL (ref 0.0–0.1)
Basophils Relative: 0.7 % (ref 0.0–3.0)
Eosinophils Absolute: 0.1 K/uL (ref 0.0–0.7)
Eosinophils Relative: 1.1 % (ref 0.0–5.0)
HCT: 43.8 % (ref 39.0–52.0)
Hemoglobin: 14.6 g/dL (ref 13.0–17.0)
Lymphocytes Relative: 27.5 % (ref 12.0–46.0)
Lymphs Abs: 2.2 K/uL (ref 0.7–4.0)
MCHC: 33.3 g/dL (ref 30.0–36.0)
MCV: 93.2 fl (ref 78.0–100.0)
Monocytes Absolute: 0.7 K/uL (ref 0.1–1.0)
Monocytes Relative: 8.2 % (ref 3.0–12.0)
Neutro Abs: 5.1 K/uL (ref 1.4–7.7)
Neutrophils Relative %: 62.5 % (ref 43.0–77.0)
Platelets: 194 K/uL (ref 150.0–400.0)
RBC: 4.7 Mil/uL (ref 4.22–5.81)
RDW: 13.5 % (ref 11.5–15.5)
WBC: 8.1 K/uL (ref 4.0–10.5)

## 2023-09-06 LAB — MICROALBUMIN / CREATININE URINE RATIO
Creatinine,U: 251.7 mg/dL
Microalb Creat Ratio: 22.2 mg/g (ref 0.0–30.0)
Microalb, Ur: 5.6 mg/dL — ABNORMAL HIGH (ref 0.0–1.9)

## 2023-09-06 LAB — VITAMIN D 25 HYDROXY (VIT D DEFICIENCY, FRACTURES): VITD: 32.2 ng/mL (ref 30.00–100.00)

## 2023-09-06 LAB — PSA: PSA: 0.17 ng/mL (ref 0.10–4.00)

## 2023-09-06 LAB — VITAMIN B12: Vitamin B-12: 1496 pg/mL — ABNORMAL HIGH (ref 211–911)

## 2023-09-06 LAB — HEMOGLOBIN A1C: Hgb A1c MFr Bld: 7.7 % — ABNORMAL HIGH (ref 4.6–6.5)

## 2023-09-07 ENCOUNTER — Ambulatory Visit: Payer: Self-pay | Admitting: Family Medicine

## 2023-09-13 ENCOUNTER — Ambulatory Visit: Payer: Medicare PPO | Admitting: Family Medicine

## 2023-09-13 ENCOUNTER — Encounter: Payer: Self-pay | Admitting: Family Medicine

## 2023-09-13 VITALS — BP 112/80 | HR 94 | Temp 98.1°F | Ht 67.5 in | Wt 232.2 lb

## 2023-09-13 DIAGNOSIS — K219 Gastro-esophageal reflux disease without esophagitis: Secondary | ICD-10-CM

## 2023-09-13 DIAGNOSIS — N400 Enlarged prostate without lower urinary tract symptoms: Secondary | ICD-10-CM

## 2023-09-13 DIAGNOSIS — E1169 Type 2 diabetes mellitus with other specified complication: Secondary | ICD-10-CM

## 2023-09-13 DIAGNOSIS — K760 Fatty (change of) liver, not elsewhere classified: Secondary | ICD-10-CM | POA: Diagnosis not present

## 2023-09-13 DIAGNOSIS — Z0001 Encounter for general adult medical examination with abnormal findings: Secondary | ICD-10-CM

## 2023-09-13 DIAGNOSIS — I10 Essential (primary) hypertension: Secondary | ICD-10-CM

## 2023-09-13 DIAGNOSIS — Z953 Presence of xenogenic heart valve: Secondary | ICD-10-CM

## 2023-09-13 DIAGNOSIS — Z2989 Encounter for other specified prophylactic measures: Secondary | ICD-10-CM

## 2023-09-13 DIAGNOSIS — Z7984 Long term (current) use of oral hypoglycemic drugs: Secondary | ICD-10-CM

## 2023-09-13 DIAGNOSIS — E559 Vitamin D deficiency, unspecified: Secondary | ICD-10-CM

## 2023-09-13 DIAGNOSIS — E785 Hyperlipidemia, unspecified: Secondary | ICD-10-CM

## 2023-09-13 DIAGNOSIS — Z0000 Encounter for general adult medical examination without abnormal findings: Secondary | ICD-10-CM

## 2023-09-13 DIAGNOSIS — J4520 Mild intermittent asthma, uncomplicated: Secondary | ICD-10-CM

## 2023-09-13 DIAGNOSIS — Z1211 Encounter for screening for malignant neoplasm of colon: Secondary | ICD-10-CM

## 2023-09-13 DIAGNOSIS — M8580 Other specified disorders of bone density and structure, unspecified site: Secondary | ICD-10-CM

## 2023-09-13 DIAGNOSIS — Z7189 Other specified counseling: Secondary | ICD-10-CM

## 2023-09-13 DIAGNOSIS — K432 Incisional hernia without obstruction or gangrene: Secondary | ICD-10-CM

## 2023-09-13 MED ORDER — PANTOPRAZOLE SODIUM 40 MG PO TBEC: 40.0000 mg | DELAYED_RELEASE_TABLET | Freq: Every day | ORAL | 3 refills | Status: AC

## 2023-09-13 MED ORDER — VALSARTAN-HYDROCHLOROTHIAZIDE 160-25 MG PO TABS: 1.0000 | ORAL_TABLET | Freq: Every day | ORAL | 3 refills | Status: AC

## 2023-09-13 MED ORDER — ALBUTEROL SULFATE HFA 108 (90 BASE) MCG/ACT IN AERS: 1.0000 | INHALATION_SPRAY | Freq: Four times a day (QID) | RESPIRATORY_TRACT | 5 refills | Status: AC | PRN

## 2023-09-13 MED ORDER — CYANOCOBALAMIN 500 MCG PO TABS: 500.0000 ug | ORAL_TABLET | ORAL | Status: AC

## 2023-09-13 MED ORDER — MONTELUKAST SODIUM 10 MG PO TABS: 10.0000 mg | ORAL_TABLET | Freq: Every day | ORAL | 3 refills | Status: AC

## 2023-09-13 MED ORDER — ROSUVASTATIN CALCIUM 40 MG PO TABS: 40.0000 mg | ORAL_TABLET | Freq: Every day | ORAL | 3 refills | Status: AC

## 2023-09-13 MED ORDER — FLUTICASONE FUROATE-VILANTEROL 200-25 MCG/ACT IN AEPB: 1.0000 | INHALATION_SPRAY | Freq: Every day | RESPIRATORY_TRACT | 3 refills | Status: AC

## 2023-09-13 MED ORDER — FINASTERIDE 5 MG PO TABS
5.0000 mg | ORAL_TABLET | Freq: Every day | ORAL | 3 refills | Status: AC
Start: 2023-09-13 — End: ?

## 2023-09-13 MED ORDER — RYBELSUS 7 MG PO TABS: 7.0000 mg | ORAL_TABLET | Freq: Every day | ORAL | 3 refills | Status: AC

## 2023-09-13 MED ORDER — METFORMIN HCL 500 MG PO TABS: 500.0000 mg | ORAL_TABLET | Freq: Two times a day (BID) | ORAL | 3 refills | Status: AC

## 2023-09-13 NOTE — Assessment & Plan Note (Signed)
 Chronic, stable period on breo singulair  and prn albuterol . Followed by pulm - appreciate their care

## 2023-09-13 NOTE — Assessment & Plan Note (Signed)
 Encourage healthy diet and lifestyle choices to affect sustainable weight loss.  Obesity complicated by diabetes, asthma, hyperlipidemia, MAFLD

## 2023-09-13 NOTE — Progress Notes (Signed)
 Ph: [phone] Fax: [phone]   Patient ID: Erik Allen, male    DOB: 1956/01/08, 68 y.o.   MRN: [id number]  This visit was conducted in person.  BP 112/80   Pulse 94   Temp 98.1 F (36.7 C) (Oral)   Ht 5' 7.5 (1.715 m)   Wt 232 lb 4 oz (105.3 kg)   SpO2 98%   BMI 35.84 kg/m    CC: CPE Subjective:   HPI: Erik Allen is a 68 y.o. male presenting on 09/13/2023 for Annual Exam   Saw health advisor 08/2023 for medicare wellness visit. Note reviewed.   No results found.  Flowsheet Row Office Visit from 09/13/2023 in [facility]  PHQ-2 Total Score 0       09/13/2023    8:03 AM 08/13/2023    9:04 AM 09/12/2022    8:19 AM 07/02/2022   11:54 AM 03/09/2022    8:13 AM  Fall Risk   Falls in the past year? 0 0 1 0 0  Number falls in past yr: 1 0 0    Injury with Fall?  0     Risk for fall due to : Other (Comment) No Fall Risks     Follow up Falls evaluation completed Falls evaluation completed       Severe AS s/p minimally invasive AVR with bioprosthetic valve 04/2019. Heart catheterization prior to surgery showed normal coronary arteries. Does need SBE ppx for future dental procedures.   Subsequent incisional lung hernia to R upper chest wall after surgery saw cardiothoracic surgeon - monitoring for now, will return if more symptomatic to consider surgical repair.   Diabetes - on metformin  500mg  bid and Rybelsus  7mg  daily - fasting sugars 110s-130s.    Sees pulm (Aleserov) for pulm fibrosis (mild scattered pulm scarring and post inflammatory nodularity as well as reactive airway disease with PFTs suggesting asthma on Breo. Transaminitis thought related to Remdesivir  + statin.    Established with [facility] Dr Brien for chronic dysphonia and h/o vocal fold lesions. Latest excision 08/2021, released from care.   Preventative: Colonoscopy 09/2022 - limited prep planned rpt next year with 2d prep ([facility]). Prostate - yearly screen, normal.  Good stream, nocturia x1. Known BPH on proscar .  Lung cancer screen - not eligible  Flu shot yearly  COVID vaccine - discussed, declines  Pneumovax 2014, prevnar-20 08/2020 Tdap 2016 shingrix - discussed - declines RSV - discussed, to consider.  Advanced directive - discussed, has this at home. HCPOA - brother Jonanthony Nahar. Full code, would be ok life support. Ok with feeding tube as well.  Seat belt use discussed.  Sunscreen use discussed, no changing moles on skin. Sees [facility] derm q6 mo - squamous cell cancer to scalp.  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 Interim medical history since our last visit reviewed. Allergies and medications reviewed and updated. Outpatient Medications Prior to Visit  Medication Sig Dispense Refill   Accu-Chek FastClix Lancets MISC Use as directed to  check blood sugar daily 102 each 3   ACCU-CHEK GUIDE TEST test strip USE AS INSTRUCTED TO CHECK BLOOD SUGAR DAILY 100 strip 3   albuterol  (PROVENTIL ) (2.5 MG/3ML) 0.083% nebulizer solution USE 1 VIAL VIA NEBULIZER EVERY 6 HOURS AS NEEDED 150 mL 3   ascorbic acid  (VITAMIN C) 500 MG tablet Take 1 tablet (500 mg total) by mouth daily. 30 tablet 0   aspirin  81 MG EC tablet Take 1 tablet (81 mg total) by mouth daily. 30 tablet 0   cholecalciferol  (VITAMIN D3) 10 MCG (400 UNIT) TABS tablet Take 400 Units by mouth daily.     hydrocortisone 2.5 % ointment Apply topically.     zinc  sulfate 220 (50 Zn) MG capsule Take 1 capsule (220 mg total) by mouth  daily. 30 capsule 0   albuterol  (VENTOLIN  HFA) 108 (90 Base) MCG/ACT inhaler TAKE 2 PUFFS BY MOUTH EVERY 6 HOURS AS NEEDED FOR WHEEZE OR SHORTNESS OF BREATH 8.5 each 5   BREO ELLIPTA  200-25 MCG/ACT AEPB INHALE 1 PUFF INTO THE LUNGS DAILY 180 each 0   cyanocobalamin  (V-R VITAMIN B-12) 500 MCG tablet Take 1 tablet (500 mcg total) by mouth daily.     finasteride  (PROSCAR ) 5 MG tablet Take 1 tablet (5 mg total) by mouth daily. 90 tablet 4   metFORMIN  (GLUCOPHAGE ) 500 MG tablet Take 1 tablet (500 mg total) by mouth 2 (two) times daily with a meal. 180 tablet 4   montelukast  (SINGULAIR ) 10 MG tablet Take 1 tablet (10 mg total) by mouth at bedtime. 90 tablet 4   pantoprazole  (PROTONIX ) 40 MG tablet Take 1 tablet (40 mg total) by mouth at bedtime. 90 tablet 4   rosuvastatin  (CRESTOR ) 40 MG tablet Take 1 tablet (40 mg total) by mouth daily. 90 tablet 4   Semaglutide  (RYBELSUS ) 7 MG TABS Take 1 tablet (7 mg total) by mouth daily. 30 tablet 6   valsartan -hydrochlorothiazide  (DIOVAN -HCT) 160-25 MG tablet Take 1 tablet by mouth daily. 90 tablet 4   No facility-administered medications prior to visit.     Per HPI unless specifically indicated in ROS section below Review of Systems  Constitutional:  Negative for activity change, appetite change, chills, fatigue, fever and unexpected weight change.  HENT:  Negative for hearing loss.   Eyes:  Negative for visual disturbance.  Respiratory:  Negative for cough, chest tightness, shortness of breath and wheezing.   Cardiovascular:  Negative for chest pain, palpitations and leg swelling.  Gastrointestinal:  Negative for abdominal distention, abdominal pain, blood in stool, constipation, diarrhea, nausea and vomiting.  Genitourinary:  Negative for difficulty urinating and hematuria.  Musculoskeletal:  Negative for arthralgias, myalgias and neck pain.  Skin:  Negative for rash.  Neurological:  Negative for dizziness, seizures, syncope and headaches.   Hematological:  Negative for adenopathy. Does not bruise/bleed easily.  Psychiatric/Behavioral:  Negative for dysphoric mood. The patient is not nervous/anxious.     Objective:  BP 112/80   Pulse 94   Temp 98.1 F (36.7 C) (Oral)   Ht 5' 7.5 (1.715 m)   Wt 232 lb 4 oz (105.3 kg)   SpO2 98%   BMI 35.84 kg/m   Wt Readings from Last 3 Encounters:  09/13/23 232 lb 4 oz (105.3 kg)  08/13/23 220 lb (99.8 kg)  04/19/23 231 lb (104.8 kg)      Physical Exam Vitals and nursing note reviewed.  Constitutional:      General: He is not in acute distress.    Appearance:  Normal appearance. He is well-developed. He is not ill-appearing.  HENT:     Head: Normocephalic and atraumatic.     Right Ear: Hearing, tympanic membrane, ear canal and external ear normal.     Left Ear: Hearing, tympanic membrane, ear canal and external ear normal.  Eyes:     General: No scleral icterus.    Extraocular Movements: Extraocular movements intact.     Conjunctiva/sclera: Conjunctivae normal.     Pupils: Pupils are equal, round, and reactive to light.  Neck:     Thyroid : No thyroid  mass or thyromegaly.  Cardiovascular:     Rate and Rhythm: Normal rate and regular rhythm.     Pulses: Normal pulses.          Radial pulses are 2+ on the right side and 2+ on the left side.     Heart sounds: Murmur (2/6 systolic - bioprosthesis in place) heard.  Pulmonary:     Effort: Pulmonary effort is normal. No respiratory distress.     Breath sounds: Normal breath sounds. No wheezing, rhonchi or rales.  Abdominal:     General: Bowel sounds are normal. There is no distension.     Palpations: Abdomen is soft. There is no mass.     Tenderness: There is no abdominal tenderness. There is no guarding or rebound.     Hernia: No hernia is present.  Musculoskeletal:        General: Normal range of motion.     Cervical back: Normal range of motion and neck supple.     Right lower leg: No edema.     Left lower leg: No edema.   Lymphadenopathy:     Cervical: No cervical adenopathy.  Skin:    General: Skin is warm and dry.     Findings: No rash.  Neurological:     General: No focal deficit present.     Mental Status: He is alert and oriented to person, place, and time.  Psychiatric:        Mood and Affect: Mood normal.        Behavior: Behavior normal.        Thought Content: Thought content normal.        Judgment: Judgment normal.       Results for orders placed or performed in visit on 09/06/23  Vitamin B12   Collection Time: 09/06/23  7:18 AM  Result Value Ref Range   Vitamin B-12 1,496 (H) 211 - 911 pg/mL  PSA   Collection Time: 09/06/23  7:18 AM  Result Value Ref Range   PSA 0.17 0.10 - 4.00 ng/mL  CBC with Differential/Platelet   Collection Time: 09/06/23  7:18 AM  Result Value Ref Range   WBC 8.1 4.0 - 10.5 K/uL   RBC 4.70 4.22 - 5.81 Mil/uL   Hemoglobin 14.6 13.0 - 17.0 g/dL   HCT 56.1 60.9 - 47.9 %   MCV 93.2 78.0 - 100.0 fl   MCHC 33.3 30.0 - 36.0 g/dL   RDW 86.4 88.4 - 84.4 %   Platelets 194.0 150.0 - 400.0 K/uL   Neutrophils Relative % 62.5 43.0 - 77.0 %   Lymphocytes Relative 27.5 12.0 - 46.0 %   Monocytes Relative 8.2 3.0 - 12.0 %   Eosinophils Relative 1.1 0.0 - 5.0 %   Basophils Relative 0.7 0.0 - 3.0 %   Neutro Abs 5.1 1.4 - 7.7 K/uL   Lymphs Abs 2.2 0.7 - 4.0 K/uL   Monocytes Absolute 0.7 0.1 -  1.0 K/uL   Eosinophils Absolute 0.1 0.0 - 0.7 K/uL   Basophils Absolute 0.1 0.0 - 0.1 K/uL  VITAMIN D  25 Hydroxy (Vit-D Deficiency, Fractures)   Collection Time: 09/06/23  7:18 AM  Result Value Ref Range   VITD 32.20 30.00 - 100.00 ng/mL  Comprehensive metabolic panel with GFR   Collection Time: 09/06/23  7:18 AM  Result Value Ref Range   Sodium 140 135 - 145 mEq/L   Potassium 4.1 3.5 - 5.1 mEq/L   Chloride 100 96 - 112 mEq/L   CO2 31 19 - 32 mEq/L   Glucose, Bld 130 (H) 70 - 99 mg/dL   BUN 24 (H) 6 - 23 mg/dL   Creatinine, Ser 9.02 0.40 - 1.50 mg/dL   Total Bilirubin  0.7 0.2 - 1.2 mg/dL   Alkaline Phosphatase 43 39 - 117 U/L   AST 57 (H) 0 - 37 U/L   ALT 72 (H) 0 - 53 U/L   Total Protein 7.0 6.0 - 8.3 g/dL   Albumin  4.5 3.5 - 5.2 g/dL   GFR 18.36 >39.99 mL/min   Calcium  9.3 8.4 - 10.5 mg/dL  Lipid panel   Collection Time: 09/06/23  7:18 AM  Result Value Ref Range   Cholesterol 137 0 - 200 mg/dL   Triglycerides 824.9 (H) 0.0 - 149.0 mg/dL   HDL 50.49 >60.99 mg/dL   VLDL 64.9 0.0 - 59.9 mg/dL   LDL Cholesterol 53 0 - 99 mg/dL   Total CHOL/HDL Ratio 3    NonHDL 87.88   Microalbumin / creatinine urine ratio   Collection Time: 09/06/23  7:18 AM  Result Value Ref Range   Microalb, Ur 5.6 (H) 0.0 - 1.9 mg/dL   Creatinine,U 748.2 mg/dL   Microalb Creat Ratio 22.2 0.0 - 30.0 mg/g  Hemoglobin A1c   Collection Time: 09/06/23  7:18 AM  Result Value Ref Range   Hgb A1c MFr Bld 7.7 (H) 4.6 - 6.5 %    Assessment & Plan:   Problem List Items Addressed This Visit     Healthcare maintenance - Primary (Chronic)   Preventative protocols reviewed and updated unless pt declined. Discussed healthy diet and lifestyle.       Advanced directives, counseling/discussion (Chronic)   Previously discussed - asked to bring us  a copy.       Vitamin D  deficiency   Continue vit D 400 units daily .      Hyperlipidemia associated with type 2 diabetes mellitus (HCC)   Chronic, stable period on high potency crestor - continue. The 10-year ASCVD risk score (Arnett DK, et al., 2019) is: 18.4%   Values used to calculate the score:     Age: 88 years     Clincally relevant sex: Male     Is Non-Hispanic African American: No     Diabetic: Yes     Tobacco smoker: No     Systolic Blood Pressure: 112 mmHg     Is BP treated: Yes     HDL Cholesterol: 49.5 mg/dL     Total Cholesterol: 137 mg/dL       Relevant Medications   metFORMIN  (GLUCOPHAGE ) 500 MG tablet   rosuvastatin  (CRESTOR ) 40 MG tablet   Semaglutide  (RYBELSUS ) 7 MG TABS   valsartan -hydrochlorothiazide   (DIOVAN -HCT) 160-25 MG tablet   Essential hypertension   Chronic, stable. Continue current regimen.       Relevant Medications   rosuvastatin  (CRESTOR ) 40 MG tablet   valsartan -hydrochlorothiazide  (DIOVAN -HCT) 160-25 MG tablet   Asthma  Chronic, stable period on breo singulair  and prn albuterol . Followed by pulm - appreciate their care       Relevant Medications   albuterol  (VENTOLIN  HFA) 108 (90 Base) MCG/ACT inhaler   fluticasone  furoate-vilanterol (BREO ELLIPTA ) 200-25 MCG/ACT AEPB   montelukast  (SINGULAIR ) 10 MG tablet   GERD   Chronic, stable period on pantoprazole  40mg  daily.       Relevant Medications   pantoprazole  (PROTONIX ) 40 MG tablet   Benign prostatic hyperplasia   Stable period on proscar .       Relevant Medications   finasteride  (PROSCAR ) 5 MG tablet   Type 2 diabetes mellitus with other specified complication (HCC)   Chronic, deteriorated  Change rybelsus  to am dosing.  Renew efforts towards low sugar low carb diabetic diet  Declines DSME referral. Reassess control in 3-4 months      Relevant Medications   metFORMIN  (GLUCOPHAGE ) 500 MG tablet   rosuvastatin  (CRESTOR ) 40 MG tablet   Semaglutide  (RYBELSUS ) 7 MG TABS   valsartan -hydrochlorothiazide  (DIOVAN -HCT) 160-25 MG tablet   Severe obesity (BMI 35.0-39.9) with comorbidity (HCC)   Encourage healthy diet and lifestyle choices to affect sustainable weight loss.  Obesity complicated by diabetes, asthma, hyperlipidemia, MAFLD      Relevant Medications   metFORMIN  (GLUCOPHAGE ) 500 MG tablet   Semaglutide  (RYBELSUS ) 7 MG TABS   Metabolic dysfunction-associated fatty liver disease (MAFLD)   Chronic, with mild transaminitis.  Last liver US  reviewed from 03/2022 - hepatic steatosis.  Consider updated liver US  Discussed updating hepatitis titers and if negative proceeding with Hep A/B series.   Fibrosis 4 Score = 2.29 Fib-4 interpretation is not validated for people under 35 or over 70 years of age.  However, scores under 2.0 are generally considered low risk.      Osteopenia   Consider updated DEXA next visit.       S/P minimally invasive aortic valve replacement with bioprosthetic valve   Incisional hernia, without obstruction or gangrene   To R upper chest wall, has seen thoracic surgery, monitoring as overall asxs      Need for SBE (subacute bacterial endocarditis) prophylaxis   Other Visit Diagnoses       Special screening for malignant neoplasms, colon       Relevant Orders   Ambulatory referral to Gastroenterology        Meds ordered this encounter  Medications   albuterol  (VENTOLIN  HFA) 108 (90 Base) MCG/ACT inhaler    Sig: Inhale 1-2 puffs into the lungs every 6 (six) hours as needed for wheezing or shortness of breath.    Dispense:  8.5 each    Refill:  5   fluticasone  furoate-vilanterol (BREO ELLIPTA ) 200-25 MCG/ACT AEPB    Sig: Inhale 1 puff into the lungs daily.    Dispense:  180 each    Refill:  3   finasteride  (PROSCAR ) 5 MG tablet    Sig: Take 1 tablet (5 mg total) by mouth daily.    Dispense:  90 tablet    Refill:  3   metFORMIN  (GLUCOPHAGE ) 500 MG tablet    Sig: Take 1 tablet (500 mg total) by mouth 2 (two) times daily with a meal.    Dispense:  180 tablet    Refill:  3   montelukast  (SINGULAIR ) 10 MG tablet    Sig: Take 1 tablet (10 mg total) by mouth at bedtime.    Dispense:  90 tablet    Refill:  3   pantoprazole  (PROTONIX ) 40 MG tablet  Sig: Take 1 tablet (40 mg total) by mouth at bedtime.    Dispense:  90 tablet    Refill:  3   rosuvastatin  (CRESTOR ) 40 MG tablet    Sig: Take 1 tablet (40 mg total) by mouth daily.    Dispense:  90 tablet    Refill:  3   Semaglutide  (RYBELSUS ) 7 MG TABS    Sig: Take 1 tablet (7 mg total) by mouth daily.    Dispense:  90 tablet    Refill:  3   valsartan -hydrochlorothiazide  (DIOVAN -HCT) 160-25 MG tablet    Sig: Take 1 tablet by mouth daily.    Dispense:  90 tablet    Refill:  3   cyanocobalamin   (V-R VITAMIN B-12) 500 MCG tablet    Sig: Take 1 tablet (500 mcg total) by mouth every Monday, Wednesday, and Friday.    Orders Placed This Encounter  Procedures   Ambulatory referral to Gastroenterology    Referral Priority:   Routine    Referral Type:   Consultation    Referral Reason:   Specialty Services Required    Number of Visits Requested:   1    Patient Instructions  Change rybelsus  to am dosing: Administer in the morning on an empty stomach with up to 4 ounces of plain water  only, >=30 minutes before eating food, drinking beverages, or taking other oral medications.   I will place referral to [facility]) for repeat colonoscopy  Consider RSV shot through pharmacy as well as shingles series.  Bring us  a copy of your living will  We will request latest eye exam from [facility] Dr in [place].  Hold vitamin B12 for 2 weeks then drop dose to 500mcg MWF.  Renew efforts towards low sugar low carb diabetic diet  Return in 3-4 months for diabetes follow up visit   Follow up plan: Return in about 3 months (around 12/14/2023) for follow up visit.  Anton Blas, MD

## 2023-09-13 NOTE — Assessment & Plan Note (Signed)
 Preventative protocols reviewed and updated unless pt declined. Discussed healthy diet and lifestyle.

## 2023-09-13 NOTE — Assessment & Plan Note (Signed)
 Chronic, stable. Continue current regimen.

## 2023-09-13 NOTE — Assessment & Plan Note (Signed)
 To R upper chest wall, has seen thoracic surgery, monitoring as overall asxs

## 2023-09-13 NOTE — Assessment & Plan Note (Addendum)
 Chronic, with mild transaminitis.  Last liver US  reviewed from 03/2022 - hepatic steatosis.  Consider updated liver US  Discussed updating hepatitis titers and if negative proceeding with Hep A/B series.   Fibrosis 4 Score = 2.29 Fib-4 interpretation is not validated for people under 35 or over 66 years of age. However, scores under 2.0 are generally considered low risk.

## 2023-09-13 NOTE — Assessment & Plan Note (Signed)
 Chronic, stable period on high potency crestor - continue. The 10-year ASCVD risk score (Arnett DK, et al., 2019) is: 18.4%   Values used to calculate the score:     Age: 68 years     Clincally relevant sex: Male     Is Non-Hispanic African American: No     Diabetic: Yes     Tobacco smoker: No     Systolic Blood Pressure: 112 mmHg     Is BP treated: Yes     HDL Cholesterol: 49.5 mg/dL     Total Cholesterol: 137 mg/dL

## 2023-09-13 NOTE — Assessment & Plan Note (Signed)
 Stable period on proscar .

## 2023-09-13 NOTE — Assessment & Plan Note (Signed)
 Previously discussed - asked to bring [place] a copy

## 2023-09-13 NOTE — Assessment & Plan Note (Addendum)
 Continue vit D 400 units daily .

## 2023-09-13 NOTE — Assessment & Plan Note (Signed)
 Consider updated DEXA next visit.

## 2023-09-13 NOTE — Assessment & Plan Note (Signed)
 Chronic, deteriorated  Change rybelsus  to am dosing.  Renew efforts towards low sugar low carb diabetic diet  Declines DSME referral. Reassess control in 3-4 months

## 2023-09-13 NOTE — Patient Instructions (Addendum)
 Change rybelsus  to am dosing: Administer in the morning on an empty stomach with up to 4 ounces of plain water  only, >=30 minutes before eating food, drinking beverages, or taking other oral medications.   I will place referral to [facility]) for repeat colonoscopy  Consider RSV shot through pharmacy as well as shingles series.  Bring us  a copy of your living will  We will request latest eye exam from [facility] [address] in [place].  Hold vitamin B12 for 2 weeks then drop dose to 500mcg MWF.  Renew efforts towards low sugar low carb diabetic diet  Return in 3-4 months for diabetes follow up visit

## 2023-09-13 NOTE — Assessment & Plan Note (Signed)
Chronic, stable period on pantoprazole '40mg'$  daily.

## 2023-09-17 ENCOUNTER — Telehealth: Payer: Self-pay

## 2023-09-17 NOTE — Telephone Encounter (Signed)
 Copied from CRM #[id number]. Topic: Clinical - Prescription Issue >> Sep 17, 2023  9:56 AM Chiquita SQUIBB wrote: Reason for CRM: Patient is calling in stating at his appointment they discussed moving the Semaglutide  (RYBELSUS ) 7 MG TABS [[id number]], to 14 MG from the 7 MG. The patient stated the only prescription at the pharmacy to pick up was the inhaler. The pharmacy did not have the Rybelsus  14MG  at the pharmacy for him. Please advise the patient if this is correct.

## 2023-09-17 NOTE — Telephone Encounter (Signed)
 Reviewed with patient. He made change to taking in Am this week. He will reach out if any questions.

## 2023-09-17 NOTE — Telephone Encounter (Signed)
 We talked about increasing rybelsus  but first decided to try am dosing of rybelsus  7mg  to see if any better sugar control with this.

## 2023-09-20 ENCOUNTER — Other Ambulatory Visit: Payer: Self-pay

## 2023-09-20 ENCOUNTER — Telehealth: Payer: Self-pay

## 2023-09-20 DIAGNOSIS — Z860100 Personal history of colon polyps, unspecified: Secondary | ICD-10-CM

## 2023-09-20 MED ORDER — NA SULFATE-K SULFATE-MG SULF 17.5-3.13-1.6 GM/177ML PO SOLN: 2.0000 | Freq: Once | ORAL | 0 refills | Status: AC

## 2023-09-20 NOTE — Telephone Encounter (Signed)
 Gastroenterology Pre-Procedure Review  Request Date: December 24, 2023 Requesting Physician: Dr. Jinny  PATIENT REVIEW QUESTIONS: The patient responded to the following health history questions as indicated:    1. Are you having any GI issues? no 2. Do you have a personal history of Polyps? no 3. Do you have a family history of Colon Cancer or Polyps? no 4. Diabetes Mellitus? yes ( ) 5. Joint replacements in the past 12 months?no 6. Major health problems in the past 3 months?no 7. Any artificial heart valves, MVP, or defibrillator?no    MEDICATIONS & ALLERGIES:    Patient reports the following regarding taking any anticoagulation/antiplatelet therapy:   Plavix, Coumadin, Eliquis, Xarelto, Lovenox , Pradaxa, Brilinta, or Effient? no Aspirin ? yes (81mg )  Patient confirms/reports the following medications:  Current Outpatient Medications  Medication Sig Dispense Refill   Accu-Chek FastClix Lancets MISC Use as directed to check blood sugar daily 102 each 3   ACCU-CHEK GUIDE TEST test strip USE AS INSTRUCTED TO CHECK BLOOD SUGAR DAILY 100 strip 3   albuterol  (PROVENTIL ) (2.5 MG/3ML) 0.083% nebulizer solution USE 1 VIAL VIA NEBULIZER EVERY 6 HOURS AS NEEDED 150 mL 3   albuterol  (VENTOLIN  HFA) 108 (90 Base) MCG/ACT inhaler Inhale 1-2 puffs into the lungs every 6 (six) hours as needed for wheezing or shortness of breath. 8.5 each 5   ascorbic acid  (VITAMIN C) 500 MG tablet Take 1 tablet (500 mg total) by mouth daily. 30 tablet 0   aspirin  81 MG EC tablet Take 1 tablet (81 mg total) by mouth daily. 30 tablet 0   cholecalciferol  (VITAMIN D3) 10 MCG (400 UNIT) TABS tablet Take 400 Units by mouth daily.     cyanocobalamin  (V-R VITAMIN B-12) 500 MCG tablet Take 1 tablet (500 mcg total) by mouth every Monday, Wednesday, and Friday.     finasteride  (PROSCAR ) 5 MG tablet Take 1 tablet (5 mg total) by mouth daily. 90 tablet 3   fluticasone  furoate-vilanterol (BREO ELLIPTA ) 200-25 MCG/ACT AEPB Inhale 1  puff into the lungs daily. 180 each 3   hydrocortisone 2.5 % ointment Apply topically.     metFORMIN  (GLUCOPHAGE ) 500 MG tablet Take 1 tablet (500 mg total) by mouth 2 (two) times daily with a meal. 180 tablet 3   montelukast  (SINGULAIR ) 10 MG tablet Take 1 tablet (10 mg total) by mouth at bedtime. 90 tablet 3   pantoprazole  (PROTONIX ) 40 MG tablet Take 1 tablet (40 mg total) by mouth at bedtime. 90 tablet 3   rosuvastatin  (CRESTOR ) 40 MG tablet Take 1 tablet (40 mg total) by mouth daily. 90 tablet 3   Semaglutide  (RYBELSUS ) 7 MG TABS Take 1 tablet (7 mg total) by mouth daily. 90 tablet 3   valsartan -hydrochlorothiazide  (DIOVAN -HCT) 160-25 MG tablet Take 1 tablet by mouth daily. 90 tablet 3   zinc  sulfate 220 (50 Zn) MG capsule Take 1 capsule (220 mg total) by mouth daily. 30 capsule 0   No current facility-administered medications for this visit.    Patient confirms/reports the following allergies:  Allergies  Allergen Reactions   Ace Inhibitors Cough   Codeine  Other (See Comments)    REACTION: DIZZY / HEADACHE   Tramadol  Nausea And Vomiting    No orders of the defined types were placed in this encounter.   AUTHORIZATION INFORMATION Primary Insurance: [employer] 1D#: Group #:  Secondary Insurance: 1D#: Group #:  SCHEDULE INFORMATION: Date:  Time: Location:

## 2023-11-06 DIAGNOSIS — D044 Carcinoma in situ of skin of scalp and neck: Secondary | ICD-10-CM | POA: Diagnosis not present

## 2023-11-06 DIAGNOSIS — L570 Actinic keratosis: Secondary | ICD-10-CM | POA: Diagnosis not present

## 2023-11-06 NOTE — Progress Notes (Signed)
 Erik Allen                                          MRN: [id number]   11/06/2023   The [facility] Quality Team Specialist reviewed this patient medical record for the purposes of chart review for care gap closure. The following were reviewed: abstraction for care gap closure-kidney health evaluation for diabetes:eGFR  and uACR.    [facility] Quality Team

## 2023-12-17 ENCOUNTER — Ambulatory Visit: Admitting: Family Medicine

## 2023-12-17 ENCOUNTER — Encounter: Payer: Self-pay | Admitting: Family Medicine

## 2023-12-17 VITALS — BP 118/72 | HR 105 | Temp 98.5°F | Ht 67.5 in | Wt 229.1 lb

## 2023-12-17 DIAGNOSIS — Z7984 Long term (current) use of oral hypoglycemic drugs: Secondary | ICD-10-CM | POA: Diagnosis not present

## 2023-12-17 DIAGNOSIS — E1169 Type 2 diabetes mellitus with other specified complication: Secondary | ICD-10-CM | POA: Diagnosis not present

## 2023-12-17 DIAGNOSIS — R7401 Elevation of levels of liver transaminase levels: Secondary | ICD-10-CM | POA: Diagnosis not present

## 2023-12-17 LAB — HEPATIC FUNCTION PANEL
ALT: 52 U/L (ref 0–53)
AST: 38 U/L — ABNORMAL HIGH (ref 0–37)
Albumin: 4.4 g/dL (ref 3.5–5.2)
Alkaline Phosphatase: 49 U/L (ref 39–117)
Bilirubin, Direct: 0.1 mg/dL (ref 0.0–0.3)
Total Bilirubin: 0.5 mg/dL (ref 0.2–1.2)
Total Protein: 7.7 g/dL (ref 6.0–8.3)

## 2023-12-17 LAB — POCT GLYCOSYLATED HEMOGLOBIN (HGB A1C): Hemoglobin A1C: 7.3 % — AB (ref 4.0–5.6)

## 2023-12-17 LAB — GAMMA GT: GGT: 47 U/L (ref 7–51)

## 2023-12-17 NOTE — Progress Notes (Signed)
 Ph: [phone] Fax: [phone]   Patient ID: Erik Allen, male    DOB: 01-Jul-1955, 68 y.o.   MRN: [id number]  This visit was conducted in person.  BP 118/72   Pulse (!) 105   Temp 98.5 F (36.9 C) (Oral)   Ht 5' 7.5 (1.715 m)   Wt 229 lb 2 oz (103.9 kg)   SpO2 96%   BMI 35.36 kg/m    CC: 3 mo DM f/u visit  Subjective:   HPI: Erik Allen is a 68 y.o. male presenting on 12/17/2023 for Medical Management of Chronic Issues (Pt here for 3 month DM f/u)   Upcoming colonoscopy next week [facility]). Planned Rybelsus  hold for 1 week, also holding metformin  for a few days.   DM - does  regularly check sugars 100-130 fasting. Compliant with antihyperglycemic regimen which includes: metformin  500mg  bid, rybelsus  7mg  daily. Denies low sugars or hypoglycemic symptoms. Denies paresthesias, blurry vision. Last diabetic eye exam 10/29/2023 [facility] doctor records requested. Glucometer brand: accucheck guide. Last foot exam: 02/2023. DSME: declined.  Lab Results  Component Value Date   HGBA1C 7.3 (A) 12/17/2023   Diabetic Foot Exam - Simple   Simple Foot Form Diabetic Foot exam was performed with the following findings: Yes 12/17/2023  8:12 AM  Visual Inspection No deformities, no ulcerations, no other skin breakdown bilaterally: Yes Sensation Testing Intact to touch and monofilament testing bilaterally: Yes Pulse Check Posterior Tibialis and Dorsalis pulse intact bilaterally: Yes Comments No claudication    Lab Results  Component Value Date   MICROALBUR 5.6 (H) 09/06/2023         Relevant past medical, surgical, family and social history reviewed and updated as indicated. Interim medical history since our last visit reviewed. Allergies and medications reviewed and updated. Outpatient Medications Prior to Visit  Medication Sig Dispense Refill   Accu-Chek FastClix Lancets MISC Use as directed to check blood sugar daily 102 each 3   ACCU-CHEK GUIDE TEST test strip USE AS  INSTRUCTED TO CHECK BLOOD SUGAR DAILY 100 strip 3   albuterol  (PROVENTIL ) (2.5 MG/3ML) 0.083% nebulizer solution USE 1 VIAL VIA NEBULIZER EVERY 6 HOURS AS NEEDED 150 mL 3   albuterol  (VENTOLIN  HFA) 108 (90 Base) MCG/ACT inhaler Inhale 1-2 puffs into the lungs every 6 (six) hours as needed for wheezing or shortness of breath. 8.5 each 5   ascorbic acid  (VITAMIN C) 500 MG tablet Take 1 tablet (500 mg total) by mouth daily. 30 tablet 0   aspirin  81 MG EC tablet Take 1 tablet (81 mg total) by mouth daily. 30 tablet 0   cholecalciferol  (VITAMIN D3) 10 MCG (400 UNIT) TABS tablet Take 400 Units by mouth daily.     cyanocobalamin  (V-R VITAMIN B-12) 500 MCG tablet Take 1 tablet (500 mcg total) by mouth every Monday, Wednesday, and Friday.     finasteride  (PROSCAR ) 5 MG tablet Take 1 tablet (5 mg total) by mouth daily. 90 tablet 3   fluticasone  furoate-vilanterol (BREO ELLIPTA ) 200-25 MCG/ACT AEPB Inhale 1 puff into the lungs daily. 180 each 3   hydrocortisone 2.5 % ointment Apply topically.     metFORMIN  (GLUCOPHAGE ) 500 MG tablet Take 1 tablet (500 mg total) by mouth 2 (two) times daily with a meal. 180 tablet 3   montelukast  (SINGULAIR ) 10 MG tablet Take 1 tablet (10 mg total) by mouth at bedtime. 90 tablet 3   pantoprazole  (PROTONIX ) 40 MG tablet Take 1 tablet (40 mg total) by mouth at  bedtime. 90 tablet 3   rosuvastatin  (CRESTOR ) 40 MG tablet Take 1 tablet (40 mg total) by mouth daily. 90 tablet 3   Semaglutide  (RYBELSUS ) 7 MG TABS Take 1 tablet (7 mg total) by mouth daily. 90 tablet 3   valsartan -hydrochlorothiazide  (DIOVAN -HCT) 160-25 MG tablet Take 1 tablet by mouth daily. 90 tablet 3   zinc  sulfate 220 (50 Zn) MG capsule Take 1 capsule (220 mg total) by mouth daily. 30 capsule 0   No facility-administered medications prior to visit.     Per HPI unless specifically indicated in ROS section below Review of Systems  Objective:  BP 118/72   Pulse (!) 105   Temp 98.5 F (36.9 C) (Oral)   Ht 5'  7.5 (1.715 m)   Wt 229 lb 2 oz (103.9 kg)   SpO2 96%   BMI 35.36 kg/m   Wt Readings from Last 3 Encounters:  12/17/23 229 lb 2 oz (103.9 kg)  09/13/23 232 lb 4 oz (105.3 kg)  08/13/23 220 lb (99.8 kg)      Physical Exam Vitals and nursing note reviewed.  Constitutional:      Appearance: Normal appearance. He is not ill-appearing.  HENT:     Head: Normocephalic and atraumatic.     Mouth/Throat:     Mouth: Mucous membranes are moist.     Pharynx: Oropharynx is clear. No oropharyngeal exudate or posterior oropharyngeal erythema.  Eyes:     Extraocular Movements: Extraocular movements intact.     Conjunctiva/sclera: Conjunctivae normal.     Pupils: Pupils are equal, round, and reactive to light.  Cardiovascular:     Rate and Rhythm: Regular rhythm. Tachycardia present.     Pulses: Normal pulses.     Heart sounds: Murmur (3/6 systolic USB) heard.     Comments: Mild tachy Pulmonary:     Effort: Pulmonary effort is normal. No respiratory distress.     Breath sounds: Normal breath sounds. No wheezing, rhonchi or rales.  Musculoskeletal:     Right lower leg: No edema.     Left lower leg: No edema.     Comments: See HPI for foot exam if done  Skin:    General: Skin is warm and dry.     Findings: No rash.  Neurological:     Mental Status: He is alert.  Psychiatric:        Mood and Affect: Mood normal.        Behavior: Behavior normal.       Results for orders placed or performed in visit on 12/17/23  POCT glycosylated hemoglobin (Hb A1C)   Collection Time: 12/17/23  8:01 AM  Result Value Ref Range   Hemoglobin A1C 7.3 (A) 4.0 - 5.6 %   HbA1c POC (<> result, manual entry)     HbA1c, POC (prediabetic range)     HbA1c, POC (controlled diabetic range)     Lab Results  Component Value Date   NA 140 09/06/2023   CL 100 09/06/2023   K 4.1 09/06/2023   CO2 31 09/06/2023   BUN 24 (H) 09/06/2023   CREATININE 0.97 09/06/2023   GFR 81.63 09/06/2023   CALCIUM  9.3 09/06/2023    ALBUMIN  4.5 09/06/2023   GLUCOSE 130 (H) 09/06/2023   Assessment & Plan:   Problem List Items Addressed This Visit     Type 2 diabetes mellitus with other specified complication (HCC) - Primary   Chronic, overall stable period with A1c down to 7.3%. Continue current regimen Reviewed Rybelsus   dosing, optimal administration (4 oz water  empty stomach, followed by food intake afterwards. RTC 3 mo DM f/u visit.       Relevant Orders   POCT glycosylated hemoglobin (Hb A1C) (Completed)   Vitamin B12   Transaminitis   Update LFTs in setting of high potency Crestor  use.       Relevant Orders   Hepatic function panel   Gamma GT     No orders of the defined types were placed in this encounter.   Orders Placed This Encounter  Procedures   Hepatic function panel   Vitamin B12   Gamma GT   POCT glycosylated hemoglobin (Hb A1C)    Patient Instructions  We will request latest diabetic eye exam Update labs today Continue Rybelsus  7mg  daily in the mornings.  Good to see you today Return as needed or in 3 months for diabetes follow up visit   Follow up plan: Return in about 3 months (around 03/18/2024) for follow up visit.  Anton Blas, MD

## 2023-12-17 NOTE — Patient Instructions (Addendum)
 We will request latest diabetic eye exam Update labs today Continue Rybelsus  7mg  daily in the mornings.  Good to see you today Return as needed or in 3 months for diabetes follow up visit

## 2023-12-17 NOTE — Assessment & Plan Note (Addendum)
 Update LFTs in setting of high potency Crestor  use.

## 2023-12-17 NOTE — Assessment & Plan Note (Signed)
 Chronic, overall stable period with A1c down to 7.3%. Continue current regimen Reviewed Rybelsus  dosing, optimal administration (4 oz water  empty stomach, followed by food intake afterwards. RTC 3 mo DM f/u visit.

## 2023-12-18 LAB — VITAMIN B12: Vitamin B-12: 434 pg/mL (ref 211–911)

## 2023-12-23 ENCOUNTER — Ambulatory Visit: Payer: Self-pay | Admitting: Family Medicine

## 2023-12-24 ENCOUNTER — Encounter
Admission: RE | Disposition: A | Discharge status: Home / Self Care | Payer: Self-pay | Source: Home / Self Care | Attending: Gastroenterology

## 2023-12-24 ENCOUNTER — Ambulatory Visit
Admission: RE | Admit: 2023-12-24 | Discharge: 2023-12-24 | Disposition: A | Discharge status: Home / Self Care | Attending: Gastroenterology | Admitting: Gastroenterology

## 2023-12-24 ENCOUNTER — Ambulatory Visit: Admitting: Certified Registered"

## 2023-12-24 ENCOUNTER — Encounter: Payer: Self-pay | Admitting: Gastroenterology

## 2023-12-24 DIAGNOSIS — Z953 Presence of xenogenic heart valve: Secondary | ICD-10-CM | POA: Insufficient documentation

## 2023-12-24 DIAGNOSIS — Z7984 Long term (current) use of oral hypoglycemic drugs: Secondary | ICD-10-CM | POA: Diagnosis not present

## 2023-12-24 DIAGNOSIS — K219 Gastro-esophageal reflux disease without esophagitis: Secondary | ICD-10-CM | POA: Insufficient documentation

## 2023-12-24 DIAGNOSIS — Z1211 Encounter for screening for malignant neoplasm of colon: Secondary | ICD-10-CM | POA: Diagnosis not present

## 2023-12-24 DIAGNOSIS — D120 Benign neoplasm of cecum: Secondary | ICD-10-CM | POA: Diagnosis not present

## 2023-12-24 DIAGNOSIS — J449 Chronic obstructive pulmonary disease, unspecified: Secondary | ICD-10-CM | POA: Diagnosis not present

## 2023-12-24 DIAGNOSIS — K635 Polyp of colon: Secondary | ICD-10-CM | POA: Insufficient documentation

## 2023-12-24 DIAGNOSIS — D124 Benign neoplasm of descending colon: Secondary | ICD-10-CM | POA: Diagnosis not present

## 2023-12-24 DIAGNOSIS — K5730 Diverticulosis of large intestine without perforation or abscess without bleeding: Secondary | ICD-10-CM | POA: Diagnosis not present

## 2023-12-24 DIAGNOSIS — D125 Benign neoplasm of sigmoid colon: Secondary | ICD-10-CM | POA: Diagnosis not present

## 2023-12-24 DIAGNOSIS — I10 Essential (primary) hypertension: Secondary | ICD-10-CM | POA: Insufficient documentation

## 2023-12-24 DIAGNOSIS — Z860100 Personal history of colon polyps, unspecified: Secondary | ICD-10-CM

## 2023-12-24 DIAGNOSIS — E119 Type 2 diabetes mellitus without complications: Secondary | ICD-10-CM | POA: Diagnosis not present

## 2023-12-24 DIAGNOSIS — Z09 Encounter for follow-up examination after completed treatment for conditions other than malignant neoplasm: Secondary | ICD-10-CM | POA: Diagnosis present

## 2023-12-24 DIAGNOSIS — D122 Benign neoplasm of ascending colon: Secondary | ICD-10-CM

## 2023-12-24 DIAGNOSIS — E785 Hyperlipidemia, unspecified: Secondary | ICD-10-CM | POA: Diagnosis not present

## 2023-12-24 DIAGNOSIS — Z860101 Personal history of adenomatous and serrated colon polyps: Secondary | ICD-10-CM

## 2023-12-24 DIAGNOSIS — J4489 Other specified chronic obstructive pulmonary disease: Secondary | ICD-10-CM | POA: Diagnosis not present

## 2023-12-24 HISTORY — PX: POLYPECTOMY: SHX149

## 2023-12-24 HISTORY — PX: COLONOSCOPY: SHX5424

## 2023-12-24 LAB — GLUCOSE, CAPILLARY: Glucose-Capillary: 130 mg/dL — ABNORMAL HIGH (ref 70–99)

## 2023-12-24 SURGERY — COLONOSCOPY
Anesthesia: General

## 2023-12-24 MED ORDER — PROPOFOL 1000 MG/100ML IV EMUL
INTRAVENOUS | Status: AC
  Filled 2023-12-24: qty 100

## 2023-12-24 MED ORDER — SODIUM CHLORIDE 0.9 % IV SOLN
INTRAVENOUS | Status: DC
  Administered 2023-12-24: 500 mL via INTRAVENOUS

## 2023-12-24 MED ORDER — PROPOFOL 10 MG/ML IV BOLUS
INTRAVENOUS | Status: DC | PRN
Start: 2023-12-24 — End: 2023-12-24
  Administered 2023-12-24: 20 mg via INTRAVENOUS
  Administered 2023-12-24: 50 mg via INTRAVENOUS
  Administered 2023-12-24: 40 mg via INTRAVENOUS
  Administered 2023-12-24 (×5): 20 mg via INTRAVENOUS

## 2023-12-24 MED ORDER — LIDOCAINE HCL (PF) 2 % IJ SOLN
INTRAMUSCULAR | Status: DC | PRN
  Administered 2023-12-24: 40 mg via INTRADERMAL

## 2023-12-24 NOTE — Op Note (Signed)
 [facility] Gastroenterology Patient Name: Erik Allen Procedure Date: 12/24/2023 7:02 AM MRN: [id number] Account #: [id number] Date of Birth: 1955/05/13 Admit Type: Outpatient Age: 68 Room: [facility] ENDO ROOM 4 Gender: Male Note Status: Finalized Instrument Name: Colon Scope [id number] Procedure:             Colonoscopy Indications:           High risk colon cancer surveillance: Personal history                         of colonic polyps Providers:             Rogelia Copping MD, MD Referring MD:          Anton Blas (Referring MD) Medicines:             Propofol  per Anesthesia Complications:         No immediate complications. Procedure:             Pre-Anesthesia Assessment:                        - Prior to the procedure, a History and Physical was                         performed, and patient medications and allergies were                         reviewed. The patient's tolerance of previous                         anesthesia was also reviewed. The risks and benefits                         of the procedure and the sedation options and risks                         were discussed with the patient. All questions were                         answered, and informed consent was obtained. Prior                         Anticoagulants: The patient has taken no anticoagulant                         or antiplatelet agents. ASA Grade Assessment: II - A                         patient with mild systemic disease. After reviewing                         the risks and benefits, the patient was deemed in                         satisfactory condition to undergo the procedure.                        After obtaining informed consent, the colonoscope was  passed under direct vision. Throughout the procedure,                         the patient's blood pressure, pulse, and oxygen                         saturations were monitored continuously. The                          Colonoscope was introduced through the anus and                         advanced to the the cecum, identified by appendiceal                         orifice and ileocecal valve. The colonoscopy was                         performed without difficulty. The patient tolerated                         the procedure well. The quality of the bowel                         preparation was excellent. Findings:      The perianal and digital rectal examinations were normal.      A 4 mm polyp was found in the cecum. The polyp was sessile. The polyp       was removed with a cold snare. Resection and retrieval were complete.      A 4 mm polyp was found in the ascending colon. The polyp was sessile.       The polyp was removed with a cold snare. Resection and retrieval were       complete.      A 4 mm polyp was found in the descending colon. The polyp was sessile.       The polyp was removed with a cold snare. Resection and retrieval were       complete.      A 4 mm polyp was found in the sigmoid colon. The polyp was sessile. The       polyp was removed with a cold snare. Resection and retrieval were       complete.      Multiple small-mouthed diverticula were found in the sigmoid colon. Impression:            - One 4 mm polyp in the cecum, removed with a cold                         snare. Resected and retrieved.                        - One 4 mm polyp in the ascending colon, removed with                         a cold snare. Resected and retrieved.                        - One 4 mm polyp in the descending colon, removed with  a cold snare. Resected and retrieved.                        - One 4 mm polyp in the sigmoid colon, removed with a                         cold snare. Resected and retrieved.                        - Diverticulosis in the sigmoid colon. Recommendation:        - Discharge patient to home.                        - Resume previous diet.                         - Continue present medications.                        - Await pathology results.                        - Repeat colonoscopy in 5 years for surveillance. Procedure Code(s):     --- Professional ---                        [phone], Colonoscopy, flexible; with removal of                         tumor(s), polyp(s), or other lesion(s) by snare                         technique Diagnosis Code(s):     --- Professional ---                        Z86.010, Personal history of colonic polyps                        D12.5, Benign neoplasm of sigmoid colon CPT copyright 2022 [employer]. All rights reserved. The codes documented in this report are preliminary and upon coder review may  be revised to meet current compliance requirements. Rogelia Copping MD, MD 12/24/2023 8:06:57 AM This report has been signed electronically. Number of Addenda: 0 Note Initiated On: 12/24/2023 7:02 AM Scope Withdrawal Time: 0 hours 14 minutes 41 seconds  Total Procedure Duration: 0 hours 17 minutes 12 seconds  Estimated Blood Loss:  Estimated blood loss: none.      [facility]

## 2023-12-24 NOTE — Anesthesia Postprocedure Evaluation (Signed)
 Anesthesia Post Note  Patient: ILDEFONSO KEANEY  Procedure(s) Performed: COLONOSCOPY POLYPECTOMY, INTESTINE  Patient location during evaluation: Endoscopy Anesthesia Type: General Level of consciousness: awake and alert Pain management: pain level controlled Vital Signs Assessment: post-procedure vital signs reviewed and stable Respiratory status: spontaneous breathing, nonlabored ventilation, respiratory function stable and patient connected to nasal cannula oxygen Cardiovascular status: blood pressure returned to baseline and stable Postop Assessment: no apparent nausea or vomiting Anesthetic complications: no   No notable events documented.   Last Vitals:  Vitals:   12/24/23 0817 12/24/23 0827  BP: 124/83 124/86  Pulse: 91 83  Resp: (!) 30 (!) 28  Temp:    SpO2: 93% 95%    Last Pain:  Vitals:   12/24/23 0827  TempSrc:   PainSc: 0-No pain                 Lendia LITTIE Mae

## 2023-12-24 NOTE — Anesthesia Preprocedure Evaluation (Signed)
 Anesthesia Evaluation  Patient identified by MRN, date of birth, ID band Patient awake    Reviewed: Allergy & Precautions, NPO status , Patient's Chart, lab work & pertinent test results  History of Anesthesia Complications Negative for: history of anesthetic complications  Airway Mallampati: III  TM Distance: >3 FB Neck ROM: full    Dental no notable dental hx.    Pulmonary asthma , COPD,  COPD inhaler   Pulmonary exam normal        Cardiovascular hypertension, Normal cardiovascular exam+ Valvular Problems/Murmurs (s/p replacement) AS   Echo 2024  IMPRESSIONS     1. Left ventricular ejection fraction, by estimation, is 60 to 65%. The  left ventricle has normal function. The left ventricle has no regional  wall motion abnormalities. Left ventricular diastolic parameters are  consistent with Grade I diastolic  dysfunction (impaired relaxation).   2. Right ventricular systolic function is normal. The right ventricular  size is normal.   3. Left atrial size was mild to moderately dilated.   4. The mitral valve is normal in structure. No evidence of mitral valve  regurgitation. No evidence of mitral stenosis.   5. The aortic valve has been repaired/replaced. Aortic valve  regurgitation is not visualized. Mild aortic valve stenosis. There is a 23  mm Edwards bioprosthetic valve present in the aortic position. Procedure  Date: 04/29/19. Aortic valve mean gradient  measures 14.0 mmHg. Aortic valve Vmax measures 2.64 m/s.   6. The inferior vena cava is normal in size with greater than 50%  respiratory variability, suggesting right atrial pressure of 3 mmHg.     Neuro/Psych  Neuromuscular disease  negative psych ROS   GI/Hepatic Neg liver ROS,GERD  ,,  Endo/Other  negative endocrine ROSdiabetes    Renal/GU negative Renal ROS  negative genitourinary   Musculoskeletal   Abdominal   Peds  Hematology negative hematology  ROS (+)   Anesthesia Other Findings Past Medical History: 06/21/2014: Aortic stenosis     Comment:  Echo 01/2018: Mild LVH, normal sys fxn EF 55-60%, G1DD,               moderate AS with mildly dilated LA. 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     Comment:  h/o thrush on vocal cords per patient 10/13/01: GERD (gastroesophageal reflux disease)     Comment:  ENT eval for GERD and thrush 2011 No date: History of kidney stones No date: HTN (hypertension) 10/13/97: Hyperlipemia 04/29/2019: S/P minimally invasive aortic valve replacement with  bioprosthetic valve     Comment:  Edwards Inspiris Resilia stented bovine pericardial               tissue valve (size 23 mm) No date: Seasonal allergic rhinitis     Comment:  worse in spring No date: Systolic murmur 2014: Transaminitis     Comment:  presumed fatty liver without US , normal iron and viral               hep panel No date: Vertigo     Comment:  none - over 1 year  Past Surgical History: 10/2010: A1 pulley release     Comment:  stensosing tenosynovitis, R milddle, ring, little  fingers 04/29/2019: AORTIC VALVE REPLACEMENT; N/A     Comment:  Procedure: MINIMALLY INVASIVE AORTIC VALVE REPLACEMENT               (AVR) USING INSPIRIS VALVE SIZE ;  Surgeon: Dusty Sudie DEL, MD;  Location: [facility] OR;  Service: Open Heart               Surgery;  Laterality: N/A; 2010: COLONOSCOPY     Comment:  WNL, rpt due 10 yrs 10/11/2022: COLONOSCOPY WITH PROPOFOL ; N/A     Comment:  TAs, diverticulosis, rpt 1 yr with 2d prep Caprice,               Corinn Skiff, MD) No date: CYSTECTOMY     Comment:  from back 09/2000: CYSTOSCOPY     Comment:  Stone retrieval 07/12/2020: DIRECT LARYNGOSCOPY; N/A      Comment:  DIRECT LARYNGOSCOPY WITH EXCISION OF VOCAL CORD NODULE               Belinda Mt, MD) 08/02/2016: EAR CYST EXCISION; Left     Comment:  Procedure: EXCISION MUCOID CYST LEFT MIDDLE FINGER WITH               DISTAL INTERPHALANGEAL JOINT ARHTROTOMY;  Surgeon: Murrell Kuba, MD;  Location: [facility];  Service:              Orthopedics;  Laterality: Left; 05/2007: FOOT CAPSULE RELEASE W/ PERCUTANEOUS HEEL CORD LENGTHENING,  TIBIAL TENDON TRANSFER     Comment:  bilateral, Dr. Verta 10/11/2022: HEMOSTASIS CLIP PLACEMENT     Comment:  Procedure: HEMOSTASIS CLIP PLACEMENT;  Surgeon: Unk Corinn Skiff, MD;  Location: [facility] ENDOSCOPY;  Service:               Gastroenterology;; 06/28/2009: INGUINAL HERNIA REPAIR     Comment:  left, with mesh, Dr. Dessa 10/11/2022: POLYPECTOMY     Comment:  Procedure: POLYPECTOMY;  Surgeon: Unk Corinn Skiff,               MD;  Location: [facility] ENDOSCOPY;  Service:               Gastroenterology;; 04/06/2019: RIGHT/LEFT HEART CATH AND CORONARY ANGIOGRAPHY; Bilateral     Comment:  Procedure: RIGHT/LEFT HEART CATH AND CORONARY               ANGIOGRAPHY;  Surgeon: Darron Deatrice LABOR, MD;  Location:               [facility] INVASIVE CV LAB;  Service: Cardiovascular;                Laterality: Bilateral; 08/02/2016: ROTATION FLAP; Left     Comment:  Procedure: ROTATION DORSAL FLAP;  Surgeon: Murrell Kuba,               MD;  Location: [facility];  Service:               Orthopedics;  Laterality: Left; 04/29/2019: TEE WITHOUT CARDIOVERSION; N/A     Comment:  Procedure: TRANSESOPHAGEAL ECHOCARDIOGRAM (TEE);                Surgeon: Dusty Sudie DEL, MD;  Location: [facility] OR;  Service: Open Heart Surgery;  Laterality: N/A; 10/1999: US  ECHOCARDIOGRAPHY     Comment:  TR, MR  BMI    Body Mass Index: 31.46 kg/m      Reproductive/Obstetrics negative OB ROS                               Anesthesia Physical Anesthesia Plan  ASA: 3  Anesthesia Plan: General   Post-op Pain Management: Minimal or no pain anticipated   Induction: Intravenous  PONV Risk Score and Plan: 1 and Propofol  infusion and TIVA  Airway Management Planned: Natural Airway and Nasal Cannula  Additional Equipment:   Intra-op Plan:   Post-operative Plan:   Informed Consent: I have reviewed the patients History and Physical, chart, labs and discussed the procedure including the risks, benefits and alternatives for the proposed anesthesia with the patient or authorized representative who has indicated his/her understanding and acceptance.     Dental Advisory Given  Plan Discussed with: Anesthesiologist, CRNA and Surgeon  Anesthesia Plan Comments: (Patient consented for risks of anesthesia including but not limited to:  - adverse reactions to medications - risk of airway placement if required - damage to eyes, teeth, lips or other oral mucosa - nerve damage due to positioning  - sore throat or hoarseness - Damage to heart, brain, nerves, lungs, other parts of body or loss of life  Patient voiced understanding and assent.)         Anesthesia Quick Evaluation

## 2023-12-24 NOTE — H&P (Signed)
 Erik Copping, MD [facility] [address], [address] [place], [place] [address] Phone:[phone] Fax : [phone]  Primary Care Physician:  Rilla Baller, MD Primary Gastroenterologist:  Dr. Copping  Pre-Procedure History & Physical: HPI:  Erik Allen is a 68 y.o. male is here for an colonoscopy.   Past Medical History:  Diagnosis Date   Aortic stenosis 06/21/2014   Echo 01/2018: Mild LVH, normal sys fxn EF 55-60%, G1DD, moderate AS with mildly dilated LA. rec yearly echocardiogram   Asthma 09/13/99   BENIGN PROSTATIC HYPERTROPHY 05/14/2003   CAP (community acquired pneumonia) 01/31/2015   COVID-19 virus infection 06/2019   Diabetes mellitus without complication (HCC)    Disorder of vocal cord 2012   h/o thrush on vocal cords per patient   GERD (gastroesophageal reflux disease) 10/13/01   ENT eval for GERD and thrush 2011   History of kidney stones    HTN (hypertension)    Hyperlipemia 10/13/97   S/P minimally invasive aortic valve replacement with bioprosthetic valve 04/29/2019   Edwards Inspiris Resilia stented bovine pericardial tissue valve (size 23 mm)   Seasonal allergic rhinitis    worse in spring   Systolic murmur    Transaminitis 2014   presumed fatty liver without US , normal iron and viral hep panel   Vertigo    none - over 1 year    Past Surgical History:  Procedure Laterality Date   A1 pulley release  10/2010   stensosing tenosynovitis, R milddle, ring, little fingers   AORTIC VALVE REPLACEMENT N/A 04/29/2019   Procedure: MINIMALLY INVASIVE AORTIC VALVE REPLACEMENT (AVR) USING INSPIRIS VALVE SIZE ;  Surgeon: Dusty Sudie DEL, MD;  Location: [facility] OR;  Service: Open Heart Surgery;  Laterality: N/A;   COLONOSCOPY  2010   WNL, rpt due 10 yrs   COLONOSCOPY WITH PROPOFOL  N/A 10/11/2022   TAs, diverticulosis, rpt 1 yr with 2d prep Caprice, Corinn Skiff, MD)   CYSTECTOMY     from back   CYSTOSCOPY  09/2000   Stone retrieval   DIRECT LARYNGOSCOPY N/A 07/12/2020    DIRECT LARYNGOSCOPY WITH EXCISION OF VOCAL CORD NODULE Belinda, Deward, MD)   EAR CYST EXCISION Left 08/02/2016   Procedure: EXCISION MUCOID CYST LEFT MIDDLE FINGER WITH DISTAL INTERPHALANGEAL JOINT ARHTROTOMY;  Surgeon: Murrell Kuba, MD;  Location: [facility];  Service: Orthopedics;  Laterality: Left;   FOOT CAPSULE RELEASE W/ PERCUTANEOUS HEEL CORD LENGTHENING, TIBIAL TENDON TRANSFER  05/2007   bilateral, Dr. Verta   HEMOSTASIS CLIP PLACEMENT  10/11/2022   Procedure: HEMOSTASIS CLIP PLACEMENT;  Surgeon: Unk Corinn Skiff, MD;  Location: [facility] ENDOSCOPY;  Service: Gastroenterology;;   INGUINAL HERNIA REPAIR  06/28/2009   left, with mesh, Dr. Dessa   POLYPECTOMY  10/11/2022   Procedure: POLYPECTOMY;  Surgeon: Unk Corinn Skiff, MD;  Location: [facility] ENDOSCOPY;  Service: Gastroenterology;;   RIGHT/LEFT HEART CATH AND CORONARY ANGIOGRAPHY Bilateral 04/06/2019   Procedure: RIGHT/LEFT HEART CATH AND CORONARY ANGIOGRAPHY;  Surgeon: Darron Deatrice LABOR, MD;  Location: [facility] INVASIVE CV LAB;  Service: Cardiovascular;  Laterality: Bilateral;   ROTATION FLAP Left 08/02/2016   Procedure: ROTATION DORSAL FLAP;  Surgeon: Murrell Kuba, MD;  Location:  SURGERY CENTER;  Service: Orthopedics;  Laterality: Left;   TEE WITHOUT CARDIOVERSION N/A 04/29/2019   Procedure: TRANSESOPHAGEAL ECHOCARDIOGRAM (TEE);  Surgeon: Dusty Sudie DEL, MD;  Location: [facility] OR;  Service: Open Heart Surgery;  Laterality: N/A;   US  ECHOCARDIOGRAPHY  10/1999   TR, MR    Prior to  Admission medications   Medication Sig Start Date End Date Taking? Authorizing Provider  Accu-Chek FastClix Lancets MISC Use as directed to check blood sugar daily 09/12/22  Yes Rilla Baller, MD  ACCU-CHEK GUIDE TEST test strip USE AS INSTRUCTED TO CHECK BLOOD SUGAR DAILY 06/10/23  Yes Rilla Baller, MD  ascorbic acid  (VITAMIN C) 500 MG tablet Take 1 tablet (500 mg total) by mouth daily. 06/26/19  Yes Maree Hue, MD  aspirin  81  MG EC tablet Take 1 tablet (81 mg total) by mouth daily. 01/11/20  Yes Agbor-Etang, Redell, MD  cholecalciferol  (VITAMIN D3) 10 MCG (400 UNIT) TABS tablet Take 400 Units by mouth daily.   Yes [provider]  cyanocobalamin  (V-R VITAMIN B-12) 500 MCG tablet Take 1 tablet (500 mcg total) by mouth every Monday, Wednesday, and Friday. 09/13/23  Yes Rilla Baller, MD  finasteride  (PROSCAR ) 5 MG tablet Take 1 tablet (5 mg total) by mouth daily. 09/13/23  Yes Rilla Baller, MD  fluticasone  furoate-vilanterol (BREO ELLIPTA ) 200-25 MCG/ACT AEPB Inhale 1 puff into the lungs daily. 09/13/23  Yes Rilla Baller, MD  metFORMIN  (GLUCOPHAGE ) 500 MG tablet Take 1 tablet (500 mg total) by mouth 2 (two) times daily with a meal. 09/13/23  Yes Rilla Baller, MD  montelukast  (SINGULAIR ) 10 MG tablet Take 1 tablet (10 mg total) by mouth at bedtime. 09/13/23  Yes Rilla Baller, MD  pantoprazole  (PROTONIX ) 40 MG tablet Take 1 tablet (40 mg total) by mouth at bedtime. 09/13/23  Yes Rilla Baller, MD  rosuvastatin  (CRESTOR ) 40 MG tablet Take 1 tablet (40 mg total) by mouth daily. 09/13/23  Yes Rilla Baller, MD  valsartan -hydrochlorothiazide  (DIOVAN -HCT) 160-25 MG tablet Take 1 tablet by mouth daily. 09/13/23  Yes Rilla Baller, MD  zinc  sulfate 220 (50 Zn) MG capsule Take 1 capsule (220 mg total) by mouth daily. 06/26/19  Yes Maree Hue, MD  albuterol  (PROVENTIL ) (2.5 MG/3ML) 0.083% nebulizer solution USE 1 VIAL VIA NEBULIZER EVERY 6 HOURS AS NEEDED 04/17/18   Rilla Baller, MD  albuterol  (VENTOLIN  HFA) 108 [phone] Base) MCG/ACT inhaler Inhale 1-2 puffs into the lungs every 6 (six) hours as needed for wheezing or shortness of breath. 09/13/23   Rilla Baller, MD  hydrocortisone 2.5 % ointment Apply topically. 11/28/21   [provider]  Semaglutide  (RYBELSUS ) 7 MG TABS Take 1 tablet (7 mg total) by mouth daily. 09/13/23   Rilla Baller, MD    Allergies as of 09/20/2023 - Review Complete  09/13/2023  Allergen Reaction Noted   Ace inhibitors Cough 02/04/2012   Codeine  Other (See Comments) 08/27/2006   Tramadol  Nausea And Vomiting 08/27/2006    Family History  Problem Relation Age of Onset   Hypertension Mother    Cancer Father 32       Lymphoma, recurrent   Hypertension Father    Hypertension Brother     Social History   Socioeconomic History   Marital status: Single    Spouse name: Not on file   Number of children: Not on file   Years of education: Not on file   Highest education level: Not on file  Occupational History   Occupation: TEAM LEADER    Employer: [employer]    Comment: Sheet metal fabrication  Tobacco Use   Smoking status: Never   Smokeless tobacco: Never  Vaping Use   Vaping status: Never Used  Substance and Sexual Activity   Alcohol use: Yes    Alcohol/week: 1.0 - 2.0 standard drink of alcohol  Types: 1 - 2 Standard drinks or equivalent per week    Comment: occassionally   Drug use: No   Sexual activity: Yes  Other Topics Concern   Not on file  Social History Narrative   Caffeine: a couple mountain dews/day, occasional coffee   Lives alone, no pets   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, 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    Activity: no regular exercise, trying to restart routine   Diet: good water , vegetables daily, some fish/meat   Social Drivers of Corporate Investment Banker Strain: Low Risk  (08/13/2023)   Overall Financial Resource Strain (CARDIA)    Difficulty of Paying Living Expenses: Not hard at all  Food Insecurity: No Food Insecurity (08/13/2023)   Hunger Vital Sign    Worried About Running Out of Food in the Last Year: Never true    Ran Out of Food in the Last Year: Never true  Transportation Needs: No Transportation Needs (08/13/2023)   PRAPARE - Administrator, Civil Service (Medical): No    Lack of Transportation (Non-Medical): No  Physical Activity: Sufficiently Active (08/13/2023)   Exercise  Vital Sign    Days of Exercise per Week: 5 days    Minutes of Exercise per Session: 120 min  Stress: No Stress Concern Present (08/13/2023)   [employer] of Occupational Health - Occupational Stress Questionnaire    Feeling of Stress: Not at all  Social Connections: Moderately Isolated (08/13/2023)   Social Connection and Isolation Panel    Frequency of Communication with Friends and Family: More than three times a week    Frequency of Social Gatherings with Friends and Family: More than three times a week    Attends Religious Services: 1 to 4 times per year    Active Member of [employer] or Organizations: No    Attends Banker Meetings: Never    Marital Status: Never married  Intimate Partner Violence: Not At Risk (08/13/2023)   Humiliation, Afraid, Rape, and Kick questionnaire    Fear of Current or Ex-Partner: No    Emotionally Abused: No    Physically Abused: No    Sexually Abused: No    Review of Systems: See HPI, otherwise negative ROS  Physical Exam: BP (!) 155/87   Pulse 98   Temp 97.6 F (36.4 C) (Temporal)   Resp 20   Ht 5' 11 (1.803 m)   Wt 102.3 kg   SpO2 95%   BMI 31.46 kg/m  General:   Alert,  pleasant and cooperative in NAD Head:  Normocephalic and atraumatic. Neck:  Supple; no masses or thyromegaly. Lungs:  Clear throughout to auscultation.    Heart:  Regular rate and rhythm. Abdomen:  Soft, nontender and nondistended. Normal bowel sounds, without guarding, and without rebound.   Neurologic:  Alert and  oriented x4;  grossly normal neurologically.  Impression/Plan: MONTOYA BRANDEL is here for an colonoscopy to be performed for a history of adenomatous polyps on 2024   Risks, benefits, limitations, and alternatives regarding  colonoscopy have been reviewed with the patient.  Questions have been answered.  All parties agreeable.   Erik Copping, MD  12/24/2023, 7:33 AM

## 2023-12-24 NOTE — Transfer of Care (Signed)
 Immediate Anesthesia Transfer of Care Note  Patient: Erik Allen  Procedure(s) Performed: COLONOSCOPY  Patient Location: PACU  Anesthesia Type:MAC  Level of Consciousness: drowsy  Airway & Oxygen Therapy: Patient Spontanous Breathing and Patient connected to nasal cannula oxygen  Post-op Assessment: Report given to RN and Post -op Vital signs reviewed and stable  Post vital signs: Reviewed and stable  Last Vitals:  Vitals Value Taken Time  BP 119/80 12/24/23 08:07  Temp    Pulse 92 12/24/23 08:07  Resp 29 12/24/23 08:07  SpO2 96 % 12/24/23 08:07  Vitals shown include unfiled device data.  Last Pain:  Vitals:   12/24/23 0713  TempSrc: Temporal  PainSc: 0-No pain         Complications: No notable events documented.

## 2023-12-25 LAB — SURGICAL PATHOLOGY

## 2023-12-26 ENCOUNTER — Encounter: Payer: Self-pay | Admitting: Family Medicine

## 2024-03-18 ENCOUNTER — Ambulatory Visit: Admitting: Family Medicine

## 2024-03-18 ENCOUNTER — Encounter: Payer: Self-pay | Admitting: Family Medicine

## 2024-03-18 VITALS — BP 138/84 | HR 97 | Temp 98.4°F | Ht 70.0 in | Wt 225.2 lb

## 2024-03-18 DIAGNOSIS — I10 Essential (primary) hypertension: Secondary | ICD-10-CM

## 2024-03-18 DIAGNOSIS — K219 Gastro-esophageal reflux disease without esophagitis: Secondary | ICD-10-CM

## 2024-03-18 DIAGNOSIS — Z953 Presence of xenogenic heart valve: Secondary | ICD-10-CM

## 2024-03-18 DIAGNOSIS — E66811 Obesity, class 1: Secondary | ICD-10-CM

## 2024-03-18 DIAGNOSIS — R42 Dizziness and giddiness: Secondary | ICD-10-CM | POA: Insufficient documentation

## 2024-03-18 DIAGNOSIS — E1169 Type 2 diabetes mellitus with other specified complication: Secondary | ICD-10-CM

## 2024-03-18 LAB — POCT GLYCOSYLATED HEMOGLOBIN (HGB A1C): Hemoglobin A1C: 7.1 % — AB (ref 4.0–5.6)

## 2024-03-18 MED ORDER — FAMOTIDINE 20 MG PO TABS: 20.0000 mg | ORAL_TABLET | Freq: Every day | ORAL | Status: AC

## 2024-03-18 NOTE — Progress Notes (Signed)
 " Ph: [phone] Fax: [phone]   Patient ID: Erik Allen, male    DOB: February 17, 1955, 69 y.o.   MRN: [id number]  This visit was conducted in person.  BP 138/84 (BP Location: Right Arm, Cuff Size: Large)   Pulse 97   Temp 98.4 F (36.9 C) (Oral)   Ht 5' 10 (1.778 m)   Wt 225 lb 3.2 oz (102.2 kg)   SpO2 95%   BMI 32.31 kg/m   BP Readings from Last 3 Encounters:  03/18/24 138/84  12/24/23 124/86  12/17/23 118/72   CC: DM f/u visit  Subjective:   HPI: Erik Allen is a 69 y.o. male presenting on 03/18/2024 for Medical Management of Chronic Issues (DM FU - has tried to cut out sodas//Pt reports two episodes of dizziness, 1 was after his colonoscopy before he ate and another lasts all day where pt felt dizzy and unsteady on his feet//Pt would like to discuss GERD meds, feels it has stopped working)   Episodes of dizziness described as lightheaded and unsteadiness, no vertigo. First episode occurred after colonoscopy Fall 2025 - ?hypoglycemia related. Had trouble   Asthma followed by pulm Dr Parris.   DM - does regularly check fasting sugars: 120-130. Compliant with antihyperglycemic regimen which includes: metformin  500mg  bid, Rybelsus  7mg  daily. Tolerating meds well without significant side effects - but feels GERD has worsened. Also cutting down on sodas. Denies low sugars or hypoglycemic symptoms. Denies paresthesias, blurry vision. Last diabetic eye exam DUE - states done 10/29/2023 at [facility] in [place]. Glucometer brand: accuchek. Last foot exam: 12/2023. DSME: completed remotely. Lab Results  Component Value Date   HGBA1C 7.1 (A) 03/18/2024   Diabetic Foot Exam - Simple   No data filed    Lab Results  Component Value Date   MICROALBUR 5.6 (H) 09/06/2023    GERD - chronically on pantoprazole  40mg  daily. Over the past week feels this has lost effect. Increased belching, gas, heartburn throughout the day. Tums helps some. No dysphagia, early satiety,  epigastric pain.     Relevant past medical, surgical, family and social history reviewed and updated as indicated. Interim medical history since our last visit reviewed. Allergies and medications reviewed and updated. Outpatient Medications Prior to Visit  Medication Sig Dispense Refill   Accu-Chek FastClix Lancets MISC Use as directed to check blood sugar daily 102 each 3   ACCU-CHEK GUIDE TEST test strip USE AS INSTRUCTED TO CHECK BLOOD SUGAR DAILY 100 strip 3   albuterol  (PROVENTIL ) (2.5 MG/3ML) 0.083% nebulizer solution USE 1 VIAL VIA NEBULIZER EVERY 6 HOURS AS NEEDED 150 mL 3   albuterol  (VENTOLIN  HFA) 108 (90 Base) MCG/ACT inhaler Inhale 1-2 puffs into the lungs every 6 (six) hours as needed for wheezing or shortness of breath. 8.5 each 5   ascorbic acid  (VITAMIN C) 500 MG tablet Take 1 tablet (500 mg total) by mouth daily. 30 tablet 0   aspirin  81 MG EC tablet Take 1 tablet (81 mg total) by mouth daily. 30 tablet 0   cholecalciferol  (VITAMIN D3) 10 MCG (400 UNIT) TABS tablet Take 400 Units by mouth daily.     cyanocobalamin  (V-R VITAMIN B-12) 500 MCG tablet Take 1 tablet (500 mcg total) by mouth every Monday, Wednesday, and Friday.     finasteride  (PROSCAR ) 5 MG tablet Take 1 tablet (5 mg total) by mouth daily. 90 tablet 3   fluticasone  furoate-vilanterol (BREO ELLIPTA ) 200-25 MCG/ACT AEPB Inhale 1 puff into the lungs daily. 180  each 3   hydrocortisone 2.5 % ointment Apply topically.     metFORMIN  (GLUCOPHAGE ) 500 MG tablet Take 1 tablet (500 mg total) by mouth 2 (two) times daily with a meal. 180 tablet 3   montelukast  (SINGULAIR ) 10 MG tablet Take 1 tablet (10 mg total) by mouth at bedtime. 90 tablet 3   pantoprazole  (PROTONIX ) 40 MG tablet Take 1 tablet (40 mg total) by mouth at bedtime. 90 tablet 3   rosuvastatin  (CRESTOR ) 40 MG tablet Take 1 tablet (40 mg total) by mouth daily. 90 tablet 3   Semaglutide  (RYBELSUS ) 7 MG TABS Take 1 tablet (7 mg total) by mouth daily. 90 tablet 3    valsartan -hydrochlorothiazide  (DIOVAN -HCT) 160-25 MG tablet Take 1 tablet by mouth daily. 90 tablet 3   zinc  sulfate 220 (50 Zn) MG capsule Take 1 capsule (220 mg total) by mouth daily. 30 capsule 0   No facility-administered medications prior to visit.     Per HPI unless specifically indicated in ROS section below Review of Systems  Objective:  BP 138/84 (BP Location: Right Arm, Cuff Size: Large)   Pulse 97   Temp 98.4 F (36.9 C) (Oral)   Ht 5' 10 (1.778 m)   Wt 225 lb 3.2 oz (102.2 kg)   SpO2 95%   BMI 32.31 kg/m   Wt Readings from Last 3 Encounters:  03/18/24 225 lb 3.2 oz (102.2 kg)  12/24/23 225 lb 9.6 oz (102.3 kg)  12/17/23 229 lb 2 oz (103.9 kg)      Physical Exam Vitals and nursing note reviewed.  Constitutional:      Appearance: Normal appearance. He is not ill-appearing.  HENT:     Head: Normocephalic and atraumatic.     Mouth/Throat:     Mouth: Mucous membranes are moist.     Pharynx: Oropharynx is clear. No oropharyngeal exudate or posterior oropharyngeal erythema.  Eyes:     Extraocular Movements: Extraocular movements intact.     Conjunctiva/sclera: Conjunctivae normal.     Pupils: Pupils are equal, round, and reactive to light.  Neck:     Thyroid : No thyroid  mass or thyromegaly.  Cardiovascular:     Rate and Rhythm: Normal rate and regular rhythm.     Pulses: Normal pulses.     Heart sounds: Murmur (3/6 systolic throughout) heard.  Pulmonary:     Effort: Pulmonary effort is normal. No respiratory distress.     Breath sounds: Normal breath sounds. No wheezing, rhonchi or rales.  Musculoskeletal:     Cervical back: Normal range of motion and neck supple.     Right lower leg: No edema.     Left lower leg: No edema.     Comments: See HPI for foot exam if done  Skin:    General: Skin is warm and dry.     Findings: No rash.  Neurological:     Mental Status: He is alert.  Psychiatric:        Mood and Affect: Mood normal.        Behavior: Behavior  normal.       Results for orders placed or performed in visit on 03/18/24  POCT glycosylated hemoglobin (Hb A1C)   Collection Time: 03/18/24  8:04 AM  Result Value Ref Range   Hemoglobin A1C 7.1 (A) 4.0 - 5.6 %   HbA1c POC (<> result, manual entry)     HbA1c, POC (prediabetic range)     HbA1c, POC (controlled diabetic range)  Assessment & Plan:   Problem List Items Addressed This Visit     Essential hypertension   Chronic, adequate. Continue current regimen.       GERD   Chronic, worsening since starting Rybelsus . Will change PPI to 30min before lunch, add pepcid  20mg  at night Update with effect      Relevant Medications   famotidine  (PEPCID ) 20 MG tablet   Type 2 diabetes mellitus with other specified complication (HCC) - Primary   Chronic, stable on current regimen -continue. Desires to continue current dose of rybelsus  Reassess DM control at 6 mo CPE  Will request latest diabetic eye exam from 10/2023 ([facility]).       Relevant Orders   POCT glycosylated hemoglobin (Hb A1C) (Completed)   Obesity, Class I, BMI 30.0-34.9 (see actual BMI)   Congratulated on weight loss to date  Continue rybelsus  7mg  daily      S/P minimally invasive aortic valve replacement with bioprosthetic valve   Dizziness   Overall reassuring exam. Recommend check cbg, BP next time he experiences dizzy spell.         Meds ordered this encounter  Medications   famotidine  (PEPCID ) 20 MG tablet    Sig: Take 1 tablet (20 mg total) by mouth at bedtime.    Orders Placed This Encounter  Procedures   POCT glycosylated hemoglobin (Hb A1C)    Patient Instructions  We will request latest diabetic eye exam from 10/29/2023 with [facility] in [place].  For worsening reflux - take pantoprazole  30 min before lunch and add pepcid  (famotidine ) 20mg  at night time over the counter, update us  with effect.  Sugars are doing better - continue working on low sugar, limiting sweetened beverages in the  diet.  Return in 6 months for physical.   Follow up plan: Return in about 6 months (around 09/15/2024) for annual exam, prior fasting for blood work.  Anton Blas, MD   "

## 2024-03-18 NOTE — Patient Instructions (Addendum)
 We will request latest diabetic eye exam from 10/29/2023 with [facility] in [place].  For worsening reflux - take pantoprazole  30 min before lunch and add pepcid  (famotidine ) 20mg  at night time over the counter, update us  with effect.  Sugars are doing better - continue working on low sugar, limiting sweetened beverages in the diet.  Return in 6 months for physical.

## 2024-03-18 NOTE — Assessment & Plan Note (Signed)
 Chronic, worsening since starting Rybelsus . Will change PPI to 30min before lunch, add pepcid  20mg  at night Update with effect

## 2024-03-18 NOTE — Assessment & Plan Note (Signed)
Chronic, adequate. Continue current regimen.  

## 2024-03-18 NOTE — Assessment & Plan Note (Signed)
 Congratulated on weight loss to date  Continue rybelsus  7mg  daily

## 2024-03-18 NOTE — Assessment & Plan Note (Addendum)
 Chronic, stable on current regimen -continue. Desires to continue current dose of rybelsus  Reassess DM control at 6 mo CPE  Will request latest diabetic eye exam from 10/2023 ([facility]).

## 2024-03-18 NOTE — Assessment & Plan Note (Signed)
 Overall reassuring exam. Recommend check cbg, BP next time he experiences dizzy spell.

## 2024-04-28 ENCOUNTER — Ambulatory Visit: Admitting: Cardiology

## 2024-08-11 ENCOUNTER — Ambulatory Visit

## 2024-08-12 ENCOUNTER — Ambulatory Visit

## 2024-08-13 ENCOUNTER — Ambulatory Visit

## 2024-09-07 ENCOUNTER — Other Ambulatory Visit

## 2024-09-14 ENCOUNTER — Encounter: Admitting: Family Medicine
